# Patient Record
Sex: Male | Born: 1955
Health system: Southern US, Community
[De-identification: ages and names within clinical notes are randomized; demographics above are authoritative.]

## PROBLEM LIST (undated history)

## (undated) DIAGNOSIS — N289 Disorder of kidney and ureter, unspecified: Secondary | ICD-10-CM

## (undated) DIAGNOSIS — J449 Chronic obstructive pulmonary disease, unspecified: Secondary | ICD-10-CM

## (undated) DIAGNOSIS — I4892 Unspecified atrial flutter: Secondary | ICD-10-CM

## (undated) DIAGNOSIS — I5030 Unspecified diastolic (congestive) heart failure: Secondary | ICD-10-CM

## (undated) DIAGNOSIS — I10 Essential (primary) hypertension: Secondary | ICD-10-CM

## (undated) DIAGNOSIS — C189 Malignant neoplasm of colon, unspecified: Secondary | ICD-10-CM

## (undated) DIAGNOSIS — R079 Chest pain, unspecified: Secondary | ICD-10-CM

## (undated) DIAGNOSIS — I77810 Thoracic aortic ectasia: Secondary | ICD-10-CM

## (undated) DIAGNOSIS — I509 Heart failure, unspecified: Secondary | ICD-10-CM

## (undated) HISTORY — PX: KIDNEY STONE SURGERY: SHX686

## (undated) HISTORY — PX: PARTIAL COLECTOMY: SHX5273

## (undated) HISTORY — PX: OTHER SURGICAL HISTORY: SHX169

## (undated) HISTORY — DX: Malignant neoplasm of colon, unspecified: C18.9

## (undated) HISTORY — PX: LITHOTRIPSY: SUR834

---

## 2001-05-31 ENCOUNTER — Observation Stay (HOSPITAL_COMMUNITY): Admission: AD | Admit: 2001-05-31 | Discharge: 2001-06-01 | Payer: Self-pay | Admitting: Family Medicine

## 2001-05-31 ENCOUNTER — Ambulatory Visit (HOSPITAL_COMMUNITY): Admission: RE | Admit: 2001-05-31 | Discharge: 2001-05-31 | Payer: Self-pay | Admitting: Family Medicine

## 2001-05-31 ENCOUNTER — Encounter: Payer: Self-pay | Admitting: Family Medicine

## 2007-10-08 ENCOUNTER — Ambulatory Visit (HOSPITAL_COMMUNITY): Admission: RE | Admit: 2007-10-08 | Discharge: 2007-10-08 | Payer: Self-pay | Admitting: Family Medicine

## 2008-07-15 ENCOUNTER — Ambulatory Visit (HOSPITAL_COMMUNITY): Admission: RE | Admit: 2008-07-15 | Discharge: 2008-07-15 | Payer: Self-pay | Admitting: Family Medicine

## 2008-10-02 ENCOUNTER — Emergency Department (HOSPITAL_COMMUNITY): Admission: EM | Admit: 2008-10-02 | Discharge: 2008-10-02 | Payer: Self-pay | Admitting: Emergency Medicine

## 2008-10-06 ENCOUNTER — Ambulatory Visit (HOSPITAL_COMMUNITY): Admission: RE | Admit: 2008-10-06 | Discharge: 2008-10-06 | Payer: Self-pay | Admitting: Urology

## 2008-10-22 ENCOUNTER — Ambulatory Visit (HOSPITAL_COMMUNITY): Admission: RE | Admit: 2008-10-22 | Discharge: 2008-10-22 | Payer: Self-pay | Admitting: Urology

## 2008-10-29 ENCOUNTER — Ambulatory Visit (HOSPITAL_COMMUNITY): Admission: RE | Admit: 2008-10-29 | Discharge: 2008-10-29 | Payer: Self-pay | Admitting: Urology

## 2008-12-09 ENCOUNTER — Ambulatory Visit (HOSPITAL_COMMUNITY): Admission: RE | Admit: 2008-12-09 | Discharge: 2008-12-09 | Payer: Self-pay | Admitting: Urology

## 2008-12-30 ENCOUNTER — Ambulatory Visit (HOSPITAL_COMMUNITY): Admission: RE | Admit: 2008-12-30 | Discharge: 2008-12-30 | Payer: Self-pay | Admitting: Urology

## 2009-01-02 ENCOUNTER — Inpatient Hospital Stay (HOSPITAL_COMMUNITY): Admission: RE | Admit: 2009-01-02 | Discharge: 2009-01-05 | Payer: Self-pay | Admitting: Urology

## 2009-09-14 ENCOUNTER — Ambulatory Visit (HOSPITAL_COMMUNITY): Admission: RE | Admit: 2009-09-14 | Discharge: 2009-09-15 | Payer: Self-pay | Admitting: Urology

## 2010-02-04 ENCOUNTER — Ambulatory Visit (HOSPITAL_COMMUNITY): Admission: RE | Admit: 2010-02-04 | Discharge: 2010-02-04 | Payer: Self-pay | Admitting: Urology

## 2010-03-03 ENCOUNTER — Ambulatory Visit (HOSPITAL_COMMUNITY): Admission: RE | Admit: 2010-03-03 | Discharge: 2010-03-03 | Payer: Self-pay | Admitting: Urology

## 2010-03-08 ENCOUNTER — Ambulatory Visit (HOSPITAL_COMMUNITY): Admission: RE | Admit: 2010-03-08 | Discharge: 2010-03-08 | Payer: Self-pay | Admitting: Urology

## 2010-03-15 ENCOUNTER — Ambulatory Visit (HOSPITAL_COMMUNITY): Admission: RE | Admit: 2010-03-15 | Discharge: 2010-03-16 | Payer: Self-pay | Admitting: Urology

## 2010-08-04 LAB — URINE CULTURE
Colony Count: NO GROWTH
Colony Count: NO GROWTH
Culture  Setup Time: 201110171709
Culture  Setup Time: 201110171710
Culture: NO GROWTH
Culture: NO GROWTH
Special Requests: NEGATIVE

## 2010-08-05 LAB — SURGICAL PCR SCREEN
MRSA, PCR: NEGATIVE
MRSA, PCR: NEGATIVE
Staphylococcus aureus: NEGATIVE
Staphylococcus aureus: NEGATIVE

## 2010-08-05 LAB — CBC
HCT: 49.3 % (ref 39.0–52.0)
Hemoglobin: 17.2 g/dL — ABNORMAL HIGH (ref 13.0–17.0)
Hemoglobin: 16.6 g/dL (ref 13.0–17.0)
MCH: 34 pg (ref 26.0–34.0)
MCH: 34.1 pg — ABNORMAL HIGH (ref 26.0–34.0)
MCHC: 34.8 g/dL (ref 30.0–36.0)
MCV: 97.6 fL (ref 78.0–100.0)
Platelets: 161 10*3/uL (ref 150–400)
Platelets: 191 10*3/uL (ref 150–400)
RBC: 5.05 MIL/uL (ref 4.22–5.81)
RBC: 4.87 MIL/uL (ref 4.22–5.81)
RDW: 13.7 % (ref 11.5–15.5)
WBC: 7.9 10*3/uL (ref 4.0–10.5)
WBC: 8 10*3/uL (ref 4.0–10.5)

## 2010-08-05 LAB — BASIC METABOLIC PANEL
BUN: 14 mg/dL (ref 6–23)
CO2: 28 mEq/L (ref 19–32)
Calcium: 9.5 mg/dL (ref 8.4–10.5)
Calcium: 9.5 mg/dL (ref 8.4–10.5)
Chloride: 103 mEq/L (ref 96–112)
Creatinine, Ser: 0.98 mg/dL (ref 0.4–1.5)
GFR calc Af Amer: >60 mL/min (ref >60)
GFR calc Af Amer: >60 mL/min (ref >60)
GFR calc non Af Amer: >60 mL/min (ref >60)
GFR calc non Af Amer: >60 mL/min (ref >60)
Glucose, Bld: 122 mg/dL — ABNORMAL HIGH (ref 70–99)
Potassium: 4.5 mEq/L (ref 3.5–5.1)
Potassium: 4.6 mEq/L (ref 3.5–5.1)
Sodium: 139 mEq/L (ref 135–145)
Sodium: 141 mEq/L (ref 135–145)

## 2010-08-10 LAB — BASIC METABOLIC PANEL
BUN: 15 mg/dL (ref 6–23)
CO2: 24 mEq/L (ref 19–32)
Calcium: 9.1 mg/dL (ref 8.4–10.5)
Chloride: 105 mEq/L (ref 96–112)
Creatinine, Ser: 0.97 mg/dL (ref 0.4–1.5)
GFR calc Af Amer: >60 mL/min (ref >60)
GFR calc non Af Amer: >60 mL/min (ref >60)
Glucose, Bld: 115 mg/dL — ABNORMAL HIGH (ref 70–99)
Potassium: 3.9 mEq/L (ref 3.5–5.1)
Sodium: 137 mEq/L (ref 135–145)

## 2010-08-10 LAB — CBC
HCT: 46 % (ref 39.0–52.0)
Hemoglobin: 15.7 g/dL (ref 13.0–17.0)
MCHC: 34.2 g/dL (ref 30.0–36.0)
MCV: 99.4 fL (ref 78.0–100.0)
Platelets: 175 10*3/uL (ref 150–400)
RBC: 4.63 MIL/uL (ref 4.22–5.81)
RDW: 13.9 % (ref 11.5–15.5)
WBC: 7.5 10*3/uL (ref 4.0–10.5)

## 2010-08-28 LAB — BASIC METABOLIC PANEL
BUN: 12 mg/dL (ref 6–23)
BUN: 13 mg/dL (ref 6–23)
CO2: 24 mEq/L (ref 19–32)
Chloride: 104 mEq/L (ref 96–112)
Creatinine, Ser: 0.95 mg/dL (ref 0.4–1.5)
GFR calc Af Amer: >60 mL/min (ref >60)
GFR calc non Af Amer: >60 mL/min (ref >60)
GFR calc non Af Amer: >60 mL/min (ref >60)
Glucose, Bld: 103 mg/dL — ABNORMAL HIGH (ref 70–99)
Glucose, Bld: 121 mg/dL — ABNORMAL HIGH (ref 70–99)
Potassium: 3.4 mEq/L — ABNORMAL LOW (ref 3.5–5.1)
Potassium: 3.8 mEq/L (ref 3.5–5.1)
Sodium: 135 mEq/L (ref 135–145)
Sodium: 136 mEq/L (ref 135–145)

## 2010-08-28 LAB — DIFFERENTIAL
Basophils Absolute: 0 10*3/uL (ref 0.0–0.1)
Eosinophils Absolute: 0.1 10*3/uL (ref 0.0–0.7)
Lymphocytes Relative: 15 % (ref 12–46)
Lymphs Abs: 2 10*3/uL (ref 0.7–4.0)
Neutrophils Relative %: 75 % (ref 43–77)

## 2010-08-28 LAB — CBC
MCV: 101.8 fL — ABNORMAL HIGH (ref 78.0–100.0)
Platelets: 132 10*3/uL — ABNORMAL LOW (ref 150–400)
RDW: 13.2 % (ref 11.5–15.5)
WBC: 13.3 10*3/uL — ABNORMAL HIGH (ref 4.0–10.5)

## 2010-08-31 LAB — CBC
Platelets: 182 10*3/uL (ref 150–400)
RBC: 4.49 MIL/uL (ref 4.22–5.81)
WBC: 13.4 10*3/uL — ABNORMAL HIGH (ref 4.0–10.5)

## 2010-08-31 LAB — URINALYSIS, ROUTINE W REFLEX MICROSCOPIC
Glucose, UA: NEGATIVE mg/dL
Nitrite: NEGATIVE
Protein, ur: NEGATIVE mg/dL

## 2010-08-31 LAB — BASIC METABOLIC PANEL
BUN: 17 mg/dL (ref 6–23)
Calcium: 9.3 mg/dL (ref 8.4–10.5)
Chloride: 102 mEq/L (ref 96–112)
Creatinine, Ser: 1.28 mg/dL (ref 0.4–1.5)
GFR calc Af Amer: >60 mL/min (ref >60)
GFR calc non Af Amer: 59 mL/min — ABNORMAL LOW (ref >60)

## 2010-08-31 LAB — DIFFERENTIAL
Lymphocytes Relative: 12 % (ref 12–46)
Lymphs Abs: 1.7 10*3/uL (ref 0.7–4.0)
Monocytes Relative: 9 % (ref 3–12)
Neutro Abs: 10.4 10*3/uL — ABNORMAL HIGH (ref 1.7–7.7)
Neutrophils Relative %: 78 % — ABNORMAL HIGH (ref 43–77)

## 2010-10-05 NOTE — Op Note (Signed)
NAME:  KENTLEY, BLYDEN            ACCOUNT NO.:  [id number]   MEDICAL RECORD NO.:  [id number]          PATIENT TYPE:  AMB   LOCATION:  DAY                           FACILITY:  [facility]   PHYSICIAN:  Ky Barban, M.D.DATE OF BIRTH:  1956/01/08   DATE OF PROCEDURE:  DATE OF DISCHARGE:                               OPERATIVE REPORT   PREOPERATIVE DIAGNOSIS:  Right ureteral calculus.   POSTOPERATIVE DIAGNOSIS:  Right ureteral calculus.   PROCEDURES:  Cystoscopy, right retrograde pyelogram.  Ureteroscopic  stone basket attempt, to do the stone basket failed.   ANESTHESIA:  General.   PROCEDURE IN DETAILS:  The patient under general anesthesia in lithotomy  position as usual prep and drape, a #25 cystoscope introduced into the  bladder, right ureteral orifice catheterized with a wedge catheter,  Hypaque was injected.  The dye goes up into the upper ureter, even below  the stone was markedly dilated and above the stone and I can see the  filling defect at L3 level.  The dye does go up into the renal pelvis,  but the upper ureter was never visualized.  Anyway, a guidewire attempt  was tried to pass up into the renal pelvis.  I tried different  guidewires, it just does not go beyond the stone, so I simply dilated  the intramural ureter with a #15 balloon, then introduced short-rigid  ureteroscope over the guidewire, went to the level of the stone, that  area was completely blocked.  I could never see the stone.  Under direct  vision, I tried to pass a guidewire and nothing goes beyond the stone  under fluoroscopic control.  I again tried different baskets and scopes.  There was a lot of granulation tissue and there was a kink also.  The  patient was breathing hard and this area moves up and down, I just  cannot see the stone.  If I could see the stone, I could use the laser,  but I cannot even see the stone, I see lot of granulation tissue and no  guidewire goes up into the renal  pelvis above the stone, so I decided to  quit the procedure, did not have any complications.  All the instruments  and guidewires were removed.  The patient left the operating room in  satisfactory condition.  I think I will have to do open  ureterolithotomy.      Ky Barban, M.D.  Electronically Signed     MIJ/MEDQ  D:  12/30/2008  T:  12/30/2008  Job:  [id number]

## 2010-10-05 NOTE — H&P (Signed)
NAME:  [name], [name]            ACCOUNT NO.:  [id number]   MEDICAL RECORD NO.:  [id number]         PATIENT TYPE:  PAMB   LOCATION:  DAY                           FACILITY:  [facility]   PHYSICIAN:  Ky Barban, M.D.DATE OF BIRTH:  05/22/1956   DATE OF ADMISSION:  12/30/2008  DATE OF DISCHARGE:  LH                              HISTORY & PHYSICAL   CHIEF COMPLAINT:  Right ureteral calculus.   HISTORY:  A 55 year old gentleman was first seen by me in May of this  year with right renal colic and CT scan showed that he has a large  calculus almost 1 cm in the right ureter causing minimum obstruction, so  I advised him to undergo ESL which was done around that time.  He has  passed lot of stone, but when I did a KUB he continued to have large  stone.  He still is symptomatic and I gave him the choice that we need  to go ahead and treat him again with ESL or I can do a stone basket.  After long discussions, the patient decided that he wanted to go ahead  and have a stone basket done.  I told him the procedure's limitations,  complications especially ureteral perforation leading to open surgery.  No guarantees about the results and use of double-J stent.  He  understands, wanted to go ahead and proceed.   PAST MEDICAL HISTORY:  Otherwise negative.   FAMILY HISTORY:  Negative.  No history of prostate cancer.   PERSONAL HISTORY:  Does not smoke or drink.   REVIEW OF SYSTEMS:  Unremarkable.   EXAMINATION:  Blood pressure 130/90, temperature is normal.  CENTRAL NERVOUS SYSTEM:  Negative.  HEAD/NECK/EYE/ENT:  Negative.  CHEST:  Symmetrical.  HEART:  Regular sinus rhythm, no murmur.  ABDOMEN:  Soft, flat.  Liver, spleen, kidneys are not palpable.  No CVA  tenderness.  EXTERNAL GENITALIA:  Circumcised, meatus adequate.  Testicles are  normal.  RECTAL:  Exam deferred.  EXTREMITIES:  Are normal.   IMPRESSION:  1. Right ureteral calculus.  2. Hypertension.   NOTE:  HE IS ALLERGIC  TO PENICILLIN.  He is taking Flomax for BPH  symptoms and also medicines for his blood pressure.   PLAN:  Cystoscopy, right retrograde pyelogram, ureteroscopic stone  basket, holmium laser lithotripsy under anesthesia as outpatient.  I may  have to insert a double-J stent.      Ky Barban, M.D.  Electronically Signed     MIJ/MEDQ  D:  12/29/2008  T:  12/29/2008  Job:  [id number]

## 2010-10-05 NOTE — Op Note (Signed)
NAME:  [name], [name]            ACCOUNT NO.:  [id number]   MEDICAL RECORD NO.:  [id number]          PATIENT TYPE:  INP   LOCATION:  A304                          FACILITY:  [facility]   PHYSICIAN:  Ky Barban, M.D.DATE OF BIRTH:  1956-01-31   DATE OF PROCEDURE:  DATE OF DISCHARGE:                               OPERATIVE REPORT   PREOPERATIVE DIAGNOSIS:  Right ureteral calculus.   POSTOPERATIVE DIAGNOSIS:  Right ureteral calculus.   PROCEDURE:  Right ureteral lithotomy and operative ureteroscopy.   ANESTHESIA:  General endotracheal.   ESTIMATED BLOOD LOSS:  50 mL.   COMPLICATIONS:  None.   INDICATIONS:  This patient has a large 1.5-cm stone.  He had one ESL  done, part of the stone came out, the other part I went with  ureteroscopy, but I was unable to get the stone arch, so I decided to  open ureteral lithotomy.  The patient was placed in right-side-up  position, over the kidney rest, the kidney rest was raised position,  patient was secured to the table.  After usual prep and drape, the  twelfth rib was already marked, incision was made below the twelfth rib  ending about couple of inches medial to it and going backward to the  angle of the rib.  The subcutaneous tissue was divided.  Muscles were  divided in the line of the incision.  The transversalis fascia was  exposed.  It was opened up at the site of the twelfth rib and the  retroperitoneum was entered.  The peritoneum was pushed anteriorly and  working on the retroperitoneum, psoas muscle was exposed by pushing the  Gerota fascia off it and with blunt and sharp dissection, I was able to  identify the ureter.  The stone was palpable.  A vessel loop was passed  below the stone and also above the stone.  It was held in place and  there was a lot of fat struck to this area of the ureter which was quite  dilated.  With palpation of the stone, an incision in the ureter was  made straight about an inch long and the stone  was impacted in the  ureter.  There was lot of tissue growing in the stone and was broken  already with several pieces because of previous lithotripsy.  Gently,  these pieces were removed one by one and after making sure all of the  pieces were out, the #8 feeding tube was passed towards the bladder  where he already had a Foley catheter clamped and after making sure the  feeding tube was in the bladder, then the same feeding tube was passed  up into the renal pelvis.  It was irrigated and with irrigation, the  tube came out and no more stone came out.  Because of multiple pieces of  stone, I decided to look into the ureter to make sure there was no  remaining piece and operative ureteroscopy was done with flexible and  also rigid scope, especially in the upper part and I do not see any  pathology in the upper ureter.  There was a  lot of inflammatory tissue  at the site where the stone was, but there was no residual stone or any  other piece.  So once the ureteroscopy was done, I decided to put #6  double-J stent lower and was passed over the guidewire, then upper end  was also positioned with the help of the guidewire.  The stent seemed to  be in proper place and urethrotomy site was closed with 3 stitches of 4-  0 Vicryl just approximating the seromuscular layer gently.  The  operative site was thoroughly irrigated with saline and there was no  bleeding.  Operative site was then drained with a Shiley sump which came  out through a separate stab wound below the incision and it was  stabilized to the skin with a silk stitch.  I was ready to close the  incision, the table was made flat, the kidney rest was dropped and the  wound was closed in 2 layers using zero Vicryl interrupted sutures.  The  subcutaneous tissue was approximated with 3-0 plain catgut.  I placed 2  catheters for irrigation with local anesthetic above and below the  incision.  The skin was closed with staples.  Sterile  gauze dressing  applied.  The patient left the operating room in satisfactory condition.  He lost about 50 mL of blood, remained stable.  No complications.      Ky Barban, M.D.  Electronically Signed     MIJ/MEDQ  D:  01/02/2009  T:  01/02/2009  Job:  [id number]

## 2010-10-05 NOTE — H&P (Signed)
NAME:  [name], [name]            ACCOUNT NO.:  [id number]   MEDICAL RECORD NO.:  [id number]         PATIENT TYPE:  PAMB   LOCATION:  DAY                           FACILITY:  [facility]   PHYSICIAN:  Ky Barban, M.D.DATE OF BIRTH:  1956/05/19   DATE OF ADMISSION:  DATE OF DISCHARGE:  LH                              HISTORY & PHYSICAL   CHIEF COMPLAINT:  Right ureteral calculus.   A 55 year old gentleman.  First I did ESL of right ureteral calculus  which was done in June of this year.  He passed few pieces of stone, but  there is still large, almost 1.5 cm stone located in the right mid  ureter causing obstruction.  Interestingly, he is very asymptomatic, so  I decided to go ahead and do a stone basket and it was unsuccessful  which I tried couple of days ago.  The only other option left was to do  open ureterolithotomy which could be done laparoscopically or regular  open procedure or use given the option of doing percutaneous approach to  get to this ureteral calculus.  I told him he may end up having this  procedure at the end, so he decided to go ahead and have ureteral  lithotomy procedure, limitation, complication, and longer recovery.  It  was discussed in detail.  He understands and wanted me to go out and  proceed.  He is coming as outpatient.  We will undergo right ureteral  lithotomy as outpatient.   PAST MEDICAL HISTORY:  Negative.   PERSONAL HISTORY:  He does not smoke and drinks occasionally socially.   ALLERGIES:  He is allergic to PENICILLIN.   PHYSICAL EXAMINATION:  VITAL SIGNS:  He does have a high blood pressure  for which he takes medicines.  On examination, blood pressure 133/96,  temperature 98.1.  CENTRAL NERVOUS SYSTEM:  Negative.  HEAD, NECK, EYE, AND ENT:  Negative.  CHEST:  Symmetrical.  HEART:  Regular sinus rhythm.  No murmur.  ABDOMEN:  Soft, flat.  Liver, spleen, and kidneys are not palpable.  No  CVA tenderness.  EXTERNAL GENITALIA:   Circumcised, meatus adequate.  Testicles are  normal.  RECTAL:  Deferred.  EXTREMITIES:  Normal.   IMPRESSION:  Right ureteral calculus.   PLAN:  Right ureteral lithotomy and use of double-J stent, then admitted  in the hospital.      Ky Barban, M.D.  Electronically Signed     MIJ/MEDQ  D:  01/01/2009  T:  01/02/2009  Job:  [id number]

## 2010-10-05 NOTE — Consult Note (Signed)
NAME:  LAYNE, DILAURO            ACCOUNT NO.:  [id number]   MEDICAL RECORD NO.:  [id number]          PATIENT TYPE:  AMB   LOCATION:  DAY                           FACILITY:  [facility]   PHYSICIAN:  Ky Barban, M.D.DATE OF BIRTH:  16-Sep-1955   DATE OF CONSULTATION:  DATE OF DISCHARGE:                                 CONSULTATION   CHIEF COMPLAINT:  Right renal colic.   HISTORY OF PRESENT ILLNESS:  A 55 year old gentleman was seen by me on  May 17 because he went to the emergency room.  CT scan showed there is a  30-mm stone in the right mid ureter.  He was vomiting, but the day I  seen him, he was completely asymptomatic, so I told him we need go ahead  and try do ESL.  Procedure, limitation, complications discussed, need  for additional procedure discussed.  He understands, want me to go ahead  and schedule.   PAST MEDICAL HISTORY:  Negative.   PERSONAL HISTORY:  Does not smoke or drink.  He drinks only socially.   ALLERGIES:  PENICILLIN.   He does have high blood pressure, takes medicines.   PHYSICAL EXAMINATION:  VITAL SIGNS:  Blood pressure 133/96, temperature  98.1.  CENTRAL NERVOUS SYSTEM:  Negative.  HEAD, NECK, ENT:  Negative.  CHEST:  Symmetrical.  HEART:  Regular S1 and S2 rhythm.  No murmur.  ABDOMEN:  Soft, flat.  Liver, spleen, kidneys not palpable.  No CVA  tenderness.  EXTERNAL GENITALIA:  Circumcised, meatus adequate.  Testicles are  normal.  RECTAL:  Deferred.  EXTREMITIES:  Normal.   IMPRESSION:  Right ureteral calculus.   PLAN:  ESL as outpatient.     Ky Barban, M.D.  Electronically Signed    MIJ/MEDQ  D:  10/21/2008  T:  10/22/2008  Job:  [id number]

## 2010-10-08 NOTE — H&P (Signed)
[facility]  Patient:    [name], [name] Visit Number: [id number] MRN: [id number]          Service Type: MED Location: 2A A222 01 Attending Physician:  Kirk Ruths Dictated by:   Karleen Hampshire, M.D. Admit Date:  05/31/2001                           History and Physical  CHIEF COMPLAINT:  Chest pain.  HISTORY OF PRESENT ILLNESS:  This is a 55 year old white male who has been having some short-lived left chest pains intermittently over the last 24 hours.  The patient states sometimes the pains are sharp, sometimes they feel like a tightness.  Denies nausea, vomiting, diaphoresis.  Denies association with movement, although the pains often happen after he has eaten.  In the office the patient had an EKG which showed a regular sinus rhythm with no ST but poor R-wave progression from V2 to V3.  The patient is a smoker.  He has a history of some elevated lipids in the past, and his blood pressure in the office today was 140/100.  The patient also underwent a chest x-ray at the office which was normal by my reading.  ALLERGIES:  PENICILLIN.  PAST MEDICAL HISTORY:  Elbow surgery in the past.  MEDICATIONS:  He is on no medications regularly.  FAMILY HISTORY:  Negative for coronary artery disease.  SOCIAL HISTORY:  He smokes.  Drinks alcohol.  Denies substance abuse.  PHYSICAL EXAMINATION:  GENERAL:  Middle-aged white male in no distress.  VITAL SIGNS:  Afebrile, respirations 20, pulse 95 and regular, blood pressure 140/100.  HEENT:  TMs normal.  Pupils are equal, round, and reactive to light and accommodation.  Oropharynx benign.  NECK:  Supple.  Without JVD, bruit, or thyromegaly.  LUNGS:  Clear in all areas.  HEART:  Regular sinus rhythm.  Without murmur, gallop, or rub.  ABDOMEN:  Soft and nontender.  EXTREMITIES:  Without clubbing, cyanosis, or edema.  NEUROLOGIC:  Grossly intact.  ASSESSMENT:  Chest pain, unknown  etiology.  Rule out myocardial infarction. Will do cardiac enzymes, serial EKGs, as well as stress.  Cardiology consult. Dictated by:   Karleen Hampshire, M.D. Attending Physician:  Kirk Ruths DD:  05/31/01 TD:  05/31/01 Job: [id number]

## 2011-01-13 ENCOUNTER — Emergency Department (HOSPITAL_COMMUNITY): Payer: BC Managed Care – PPO

## 2011-01-13 ENCOUNTER — Encounter: Payer: Self-pay | Admitting: *Deleted

## 2011-01-13 ENCOUNTER — Other Ambulatory Visit: Payer: Self-pay

## 2011-01-13 ENCOUNTER — Emergency Department (HOSPITAL_COMMUNITY)
Admission: EM | Admit: 2011-01-13 | Discharge: 2011-01-13 | Disposition: A | Discharge status: Home / Self Care | Payer: BC Managed Care – PPO | Attending: Emergency Medicine | Admitting: Emergency Medicine

## 2011-01-13 DIAGNOSIS — F17200 Nicotine dependence, unspecified, uncomplicated: Secondary | ICD-10-CM | POA: Insufficient documentation

## 2011-01-13 DIAGNOSIS — R209 Unspecified disturbances of skin sensation: Secondary | ICD-10-CM | POA: Insufficient documentation

## 2011-01-13 DIAGNOSIS — R079 Chest pain, unspecified: Secondary | ICD-10-CM | POA: Insufficient documentation

## 2011-01-13 DIAGNOSIS — I10 Essential (primary) hypertension: Secondary | ICD-10-CM | POA: Insufficient documentation

## 2011-01-13 DIAGNOSIS — I4510 Unspecified right bundle-branch block: Secondary | ICD-10-CM | POA: Insufficient documentation

## 2011-01-13 HISTORY — DX: Essential (primary) hypertension: I10

## 2011-01-13 HISTORY — DX: Disorder of kidney and ureter, unspecified: N28.9

## 2011-01-13 LAB — BASIC METABOLIC PANEL
CO2: 26 mEq/L (ref 19–32)
Calcium: 9.5 mg/dL (ref 8.4–10.5)
Chloride: 100 mEq/L (ref 96–112)
Creatinine, Ser: 0.9 mg/dL (ref 0.50–1.35)
Glucose, Bld: 134 mg/dL — ABNORMAL HIGH (ref 70–99)

## 2011-01-13 LAB — CBC
Hemoglobin: 16 g/dL (ref 13.0–17.0)
MCH: 34.3 pg — ABNORMAL HIGH (ref 26.0–34.0)
MCV: 98.3 fL (ref 78.0–100.0)
Platelets: 160 10*3/uL (ref 150–400)
RBC: 4.67 MIL/uL (ref 4.22–5.81)
WBC: 7.5 10*3/uL (ref 4.0–10.5)

## 2011-01-13 LAB — CARDIAC PANEL(CRET KIN+CKTOT+MB+TROPI)
CK, MB: 9.1 ng/mL (ref 0.3–4.0)
Relative Index: 4.8 — ABNORMAL HIGH (ref 0.0–2.5)
Total CK: 190 U/L (ref 7–232)

## 2011-01-13 MED ORDER — ASPIRIN 81 MG PO CHEW
324.0000 mg | CHEWABLE_TABLET | Freq: Once | ORAL | Status: AC
  Administered 2011-01-13: 324 mg via ORAL

## 2011-01-13 MED ORDER — NITROGLYCERIN 0.4 MG SL SUBL
0.4000 mg | SUBLINGUAL_TABLET | Freq: Once | SUBLINGUAL | Status: AC
  Administered 2011-01-13: 0.4 mg via SUBLINGUAL

## 2011-01-13 NOTE — ED Notes (Signed)
Patient with no complaints at this time. Respirations even and unlabored. Skin warm/dry. Discharge instructions reviewed with patient at this time. Patient given opportunity to voice concerns/ask questions. IV removed per policy and band-aid applied to site. Patient discharged at this time and left Emergency Department with steady gait.  

## 2011-01-13 NOTE — ED Provider Notes (Signed)
History     CSN: [id number] Arrival date & time: 01/13/2011  4:14 AM  Chief Complaint  Patient presents with  . Chest Pain   HPI Comments: Seen 0416.   Patient is a 55 y.o. male presenting with chest pain. The history is provided by the patient.  Chest Pain The chest pain began 1 - 2 hours ago (Patient woke up to go to the bathroom. His left arm and hand were numb and he had discomfort in his shoulder and his left chest. Once he got up and went to the bathoom the numbness and aching in his arm resolved. He continued to have aching to shoulder .). The chest pain is unchanged (Discomfort to shoulder and left chest continue.). Associated with: nothing. At its most intense, the pain is at 5/10. The pain is currently at 4/10. The quality of the pain is described as aching and pressure-like. Radiates to: noticed first in his arm and hand, then shoulder and chest. Exacerbated by: nothing. Pertinent negatives for primary symptoms include no fever, no fatigue, no syncope, no shortness of breath, no cough, no wheezing, no palpitations, no abdominal pain, no nausea, no vomiting and no altered mental status. Primary symptoms comment: numbness and tingling in L arm, shoulder pain, chest pain  Associated symptoms include numbness.  Pertinent negatives for associated symptoms include no claudication, no diaphoresis, no lower extremity edema, no near-syncope, no orthopnea, no paroxysmal nocturnal dyspnea and no weakness. He tried nothing for the symptoms. Risk factors include lack of exercise, male gender and smoking/tobacco exposure. Past medical history comments: hypertension, kidney stones  Procedure history is negative for cardiac catheterization, echocardiogram and exercise treadmill test.     Past Medical History  Diagnosis Date  . Hypertension   . Renal disorder     Past Surgical History  Procedure Date  . Lithotripsy   . Kidney stone surgery     History reviewed. No pertinent family  history.  History  Substance Use Topics  . Smoking status: Current Everyday Smoker  . Smokeless tobacco: Not on file  . Alcohol Use: Yes     occasionally      Review of Systems  Constitutional: Negative for fever, diaphoresis and fatigue.  Respiratory: Negative for cough, shortness of breath and wheezing.   Cardiovascular: Positive for chest pain. Negative for palpitations, orthopnea, claudication, leg swelling, syncope and near-syncope.  Gastrointestinal: Negative for nausea, vomiting and abdominal pain.  Neurological: Positive for numbness. Negative for weakness.  Psychiatric/Behavioral: Negative for altered mental status.  All other systems reviewed and are negative.    Physical Exam  BP 131/84  Pulse 90  Temp(Src) 97.9 F (36.6 C) (Oral)  Resp 18  Ht 6' (1.829 m)  Wt 250 lb (113.399 kg)  BMI 33.91 kg/m2  SpO2 96%  Physical Exam  Nursing note and vitals reviewed. Constitutional: He is oriented to person, place, and time. He appears well-developed and well-nourished.  HENT:  Head: Normocephalic and atraumatic.  Eyes: EOM are normal.  Neck: Normal range of motion. Neck supple.  Cardiovascular: Normal rate, normal heart sounds and intact distal pulses.   Pulmonary/Chest: Effort normal and breath sounds normal.  Abdominal: Soft.  Musculoskeletal: Normal range of motion.  Neurological: He is alert and oriented to person, place, and time. He has normal reflexes.  Skin: Skin is warm and dry.    ED Course  Procedures  Results for orders placed during the hospital encounter of 01/13/11  CBC      Component Value  Range   WBC 7.5  4.0 - 10.5 (K/uL)   RBC 4.67  4.22 - 5.81 (MIL/uL)   Hemoglobin 16.0  13.0 - 17.0 (g/dL)   HCT 40.9  81.1 - 91.4 (%)   MCV 98.3  78.0 - 100.0 (fL)   MCH 34.3 (*) 26.0 - 34.0 (pg)   MCHC 34.9  30.0 - 36.0 (g/dL)   RDW 78.2  95.6 - 21.3 (%)   Platelets 160  150 - 400 (K/uL)  BASIC METABOLIC PANEL      Component Value Range   Sodium 135   135 - 145 (mEq/L)   Potassium 3.7  3.5 - 5.1 (mEq/L)   Chloride 100  96 - 112 (mEq/L)   CO2 26  19 - 32 (mEq/L)   Glucose, Bld 134 (*) 70 - 99 (mg/dL)   BUN 17  6 - 23 (mg/dL)   Creatinine, Ser 0.86  0.50 - 1.35 (mg/dL)   Calcium 9.5  8.4 - 57.8 (mg/dL)   GFR calc non Af Amer >60  >60 (mL/min)   GFR calc Af Amer >60  >60 (mL/min)  CARDIAC PANEL(CRET KIN+CKTOT+MB+TROPI)      Component Value Range   Total CK 190  7 - 232 (U/L)   CK, MB 9.1 (*) 0.3 - 4.0 (ng/mL)   Troponin I <0.30  <0.30 (ng/mL)   Relative Index 4.8 (*) 0.0 - 2.5    MDM  Date: 01/13/2011  0409  Rate: 94  Rhythm: normal sinus rhythm and premature ventricular contractions (PVC)  QRS Axis: right  Intervals: normal  ST/T Wave abnormalities: normal  Conduction Disutrbances:incomplete RBBB  Narrative Interpretation:   Old EKG Reviewed: none available Patient with onset of left arm numbness with pain to the shoulder and left chest upon waking. Partially resolved when he got up. Given aspirin and ntg SL. Pain relieved. Ekg with RBBB and no previous to compare with. Chest xray negative. Labs unremarkable except for an elevated MB. Troponin normal and myoglobin normal. Patient given IVF, took PO fluids. Ambulated in the department without pain or shortness of breath. Patient will follow up with [facility].Pt feels improved after observation and/or treatment in ED.Reviewed all results. Stressed importance of follow up. He will return to the ER if he has any further chest pain. MDM Reviewed: nursing note and vitals Interpretation: labs, ECG and x-ray         Nicoletta Dress. Colon Branch, MD 01/13/11 [phone]

## 2011-01-13 NOTE — ED Notes (Signed)
Pt c/o cp to left side of chest that radiates down left arm; pt describes pain as a pressure and states the pain woke him up from sleep

## 2011-01-13 NOTE — ED Notes (Signed)
CRITICAL VALUE ALERT  Critical value received:  CKMB  Date of notification: 01/13/11  Time of notification:  0502  Critical value read back:  Yes  Nurse who received alert:  Neldon Mc RN  MD notified (1st page):  Greta Doom MD  Time of first page:  0503  MD notified (2nd page):  Time of second page:  Responding MD:  Greta Doom MD  Time MD responded:  [phone]

## 2011-01-13 NOTE — ED Notes (Signed)
Pt given asa and 1 sl ntg, rated pain 1 before meds, now rates 0, feeling is better.  md notified

## 2011-01-13 NOTE — ED Notes (Signed)
Pt stated that he woke from sleep tonight having left side cp, pain radiates sown left, +sob at times, denies any n/v, no cold/cough. No fever. Felt well all day, denie sany cardiac history, but had stress test apporx 4 years ago, which he said was neg.  Drive self to er, +smoker.  Pt placed on all monitors, ekg obained andiv started and labs drawn.  Skin warm and dry at arrival.

## 2011-09-01 ENCOUNTER — Other Ambulatory Visit (HOSPITAL_COMMUNITY): Payer: Self-pay | Admitting: Family Medicine

## 2011-09-01 ENCOUNTER — Ambulatory Visit (HOSPITAL_COMMUNITY)
Admission: RE | Admit: 2011-09-01 | Discharge: 2011-09-01 | Disposition: A | Discharge status: Home / Self Care | Payer: BC Managed Care – PPO | Source: Ambulatory Visit | Attending: Family Medicine | Admitting: Family Medicine

## 2011-09-01 DIAGNOSIS — R079 Chest pain, unspecified: Secondary | ICD-10-CM | POA: Insufficient documentation

## 2011-09-01 DIAGNOSIS — W19XXXA Unspecified fall, initial encounter: Secondary | ICD-10-CM | POA: Insufficient documentation

## 2011-09-01 DIAGNOSIS — S2239XA Fracture of one rib, unspecified side, initial encounter for closed fracture: Secondary | ICD-10-CM | POA: Insufficient documentation

## 2012-05-25 ENCOUNTER — Telehealth: Payer: Self-pay

## 2012-05-25 NOTE — Telephone Encounter (Signed)
Ginger called and LMOM for a return call.  

## 2012-06-05 NOTE — Telephone Encounter (Signed)
LMOM to call.

## 2012-06-11 NOTE — Telephone Encounter (Signed)
Letter to PCP

## 2013-04-01 ENCOUNTER — Telehealth: Payer: Self-pay

## 2013-04-01 NOTE — Telephone Encounter (Signed)
Pt was referred by Dr. Regino Schultze for screening colonoscopy. He left VM he is ready to schedule. I returned his call and LMOM for a return call.

## 2013-04-02 ENCOUNTER — Other Ambulatory Visit: Payer: Self-pay

## 2013-04-02 DIAGNOSIS — Z1211 Encounter for screening for malignant neoplasm of colon: Secondary | ICD-10-CM

## 2013-04-02 NOTE — Telephone Encounter (Signed)
PREPOPIK-DRINK WATER TO KEEP URINE LIGHT YELLOW.  PT SHOULD DROP OFF RX 3 DAYS PRIOR TO PROCEDURE.  

## 2013-04-02 NOTE — Telephone Encounter (Signed)
Gastroenterology Pre-Procedure Review  Request Date: 04/02/2013 Requesting Physician: Dr. Regino Schultze  PATIENT REVIEW QUESTIONS: The patient responded to the following health history questions as indicated:    1. Diabetes Melitis: no 2. Joint replacements in the past 12 months: no 3. Major health problems in the past 3 months: no 4. Has an artificial valve or MVP: no 5. Has a defibrillator: no 6. Has been advised in past to take antibiotics in advance of a procedure like teeth cleaning: no    MEDICATIONS & ALLERGIES:    Patient reports the following regarding taking any blood thinners:   Plavix? no Aspirin? no Coumadin? no  Patient confirms/reports the following medications:  Current Outpatient Prescriptions  Medication Sig Dispense Refill  . LISINOPRIL PO Take 10 mg by mouth daily.       . Multiple Vitamin (MULTIVITAMIN) tablet Take 1 tablet by mouth daily.       No current facility-administered medications for this visit.    Patient confirms/reports the following allergies:  Allergies  Allergen Reactions  . Penicillins     No orders of the defined types were placed in this encounter.    AUTHORIZATION INFORMATION Primary Insurance:   ID #:   Group #:  Pre-Cert / Auth required:  Pre-Cert / Auth #:   Secondary Insurance:   ID #:  Group #:  Pre-Cert / Auth required:  Pre-Cert / Auth #:   SCHEDULE INFORMATION: Procedure has been scheduled as follows:  Date: 04/29/2013                 Time:  10:30 AM Location: [facility] Short Stay  This Gastroenterology Pre-Precedure Review Form is being routed to the following provider(s): Jonette Eva, MD

## 2013-04-03 MED ORDER — SOD PICOSULFATE-MAG OX-CIT ACD 10-3.5-12 MG-GM-GM PO PACK: 1.0000 | PACK | ORAL | Status: DC

## 2013-04-03 NOTE — Telephone Encounter (Signed)
Rx sent to the pharmacy and instructions mailed to pt.  

## 2013-04-09 ENCOUNTER — Ambulatory Visit (HOSPITAL_COMMUNITY)
Admission: RE | Admit: 2013-04-09 | Discharge: 2013-04-09 | Disposition: A | Discharge status: Home / Self Care | Payer: BC Managed Care – PPO | Source: Ambulatory Visit | Attending: Family Medicine | Admitting: Family Medicine

## 2013-04-09 ENCOUNTER — Other Ambulatory Visit (HOSPITAL_COMMUNITY): Payer: Self-pay | Admitting: Family Medicine

## 2013-04-09 DIAGNOSIS — R55 Syncope and collapse: Secondary | ICD-10-CM

## 2013-04-09 DIAGNOSIS — I10 Essential (primary) hypertension: Secondary | ICD-10-CM | POA: Insufficient documentation

## 2013-04-22 DIAGNOSIS — C189 Malignant neoplasm of colon, unspecified: Secondary | ICD-10-CM

## 2013-04-22 HISTORY — DX: Malignant neoplasm of colon, unspecified: C18.9

## 2013-04-24 ENCOUNTER — Encounter (HOSPITAL_COMMUNITY): Payer: Self-pay | Admitting: Pharmacy Technician

## 2013-04-29 ENCOUNTER — Ambulatory Visit (HOSPITAL_COMMUNITY)
Admission: RE | Admit: 2013-04-29 | Discharge: 2013-04-29 | Disposition: A | Discharge status: Home / Self Care | Payer: BC Managed Care – PPO | Source: Ambulatory Visit | Attending: Gastroenterology | Admitting: Gastroenterology

## 2013-04-29 ENCOUNTER — Encounter (HOSPITAL_COMMUNITY)
Admission: RE | Disposition: A | Discharge status: Home / Self Care | Payer: Self-pay | Source: Ambulatory Visit | Attending: Gastroenterology

## 2013-04-29 ENCOUNTER — Encounter (HOSPITAL_COMMUNITY): Payer: Self-pay | Admitting: *Deleted

## 2013-04-29 DIAGNOSIS — K639 Disease of intestine, unspecified: Secondary | ICD-10-CM

## 2013-04-29 DIAGNOSIS — K620 Anal polyp: Secondary | ICD-10-CM | POA: Insufficient documentation

## 2013-04-29 DIAGNOSIS — K648 Other hemorrhoids: Secondary | ICD-10-CM | POA: Insufficient documentation

## 2013-04-29 DIAGNOSIS — Z1211 Encounter for screening for malignant neoplasm of colon: Secondary | ICD-10-CM

## 2013-04-29 DIAGNOSIS — I10 Essential (primary) hypertension: Secondary | ICD-10-CM | POA: Insufficient documentation

## 2013-04-29 DIAGNOSIS — D126 Benign neoplasm of colon, unspecified: Secondary | ICD-10-CM

## 2013-04-29 DIAGNOSIS — D128 Benign neoplasm of rectum: Secondary | ICD-10-CM | POA: Insufficient documentation

## 2013-04-29 HISTORY — PX: COLONOSCOPY: SHX5424

## 2013-04-29 LAB — COMPREHENSIVE METABOLIC PANEL
Albumin: 3.9 g/dL (ref 3.5–5.2)
BUN: 12 mg/dL (ref 6–23)
Creatinine, Ser: 0.94 mg/dL (ref 0.50–1.35)
GFR calc Af Amer: >90 mL/min (ref >90)
Glucose, Bld: 120 mg/dL — ABNORMAL HIGH (ref 70–99)
Total Protein: 7.3 g/dL (ref 6.0–8.3)

## 2013-04-29 LAB — CBC WITH DIFFERENTIAL/PLATELET
Basophils Relative: 0 % (ref 0–1)
Eosinophils Absolute: 0.1 10*3/uL (ref 0.0–0.7)
Hemoglobin: 17.5 g/dL — ABNORMAL HIGH (ref 13.0–17.0)
MCHC: 35.1 g/dL (ref 30.0–36.0)
Monocytes Relative: 11 % (ref 3–12)
Neutro Abs: 4.9 10*3/uL (ref 1.7–7.7)
Neutrophils Relative %: 65 % (ref 43–77)
Platelets: 163 10*3/uL (ref 150–400)
RBC: 5.05 MIL/uL (ref 4.22–5.81)

## 2013-04-29 LAB — PROTIME-INR
INR: 1.03 (ref 0.00–1.49)
Prothrombin Time: 13.3 seconds (ref 11.6–15.2)

## 2013-04-29 SURGERY — COLONOSCOPY
Anesthesia: Moderate Sedation

## 2013-04-29 MED ORDER — SPOT INK MARKER SYRINGE KIT
PACK | SUBMUCOSAL | Status: DC | PRN
  Administered 2013-04-29: 6 mL via SUBMUCOSAL

## 2013-04-29 MED ORDER — MIDAZOLAM HCL 5 MG/5ML IJ SOLN
INTRAMUSCULAR | Status: DC | PRN
  Administered 2013-04-29 (×2): 2 mg via INTRAVENOUS

## 2013-04-29 MED ORDER — MIDAZOLAM HCL 5 MG/5ML IJ SOLN
INTRAMUSCULAR | Status: AC
  Filled 2013-04-29: qty 10

## 2013-04-29 MED ORDER — MEPERIDINE HCL 100 MG/ML IJ SOLN
INTRAMUSCULAR | Status: DC | PRN
  Administered 2013-04-29 (×2): 25 mg via INTRAVENOUS

## 2013-04-29 MED ORDER — SODIUM CHLORIDE 0.9 % IV SOLN
INTRAVENOUS | Status: DC
  Administered 2013-04-29: 10:00:00 via INTRAVENOUS

## 2013-04-29 MED ORDER — SODIUM CHLORIDE 0.9 % IJ SOLN
PREFILLED_SYRINGE | INTRAMUSCULAR | Status: DC | PRN
  Administered 2013-04-29 (×2)

## 2013-04-29 MED ORDER — MEPERIDINE HCL 100 MG/ML IJ SOLN
INTRAMUSCULAR | Status: AC
  Filled 2013-04-29: qty 2

## 2013-04-29 NOTE — Op Note (Signed)
[facility] [address] [place], [address]   COLONOSCOPY PROCEDURE REPORT  PATIENT: [name], [name]  MR#: [id number] BIRTHDATE: 06-13-1955 , 57  yrs. old GENDER: Male ENDOSCOPIST: Jonette Eva, MD REFERRED AO:[id number] Regino Schultze, M.D. PROCEDURE DATE:  04/29/2013 PROCEDURE:   Colonoscopy with snare polypectomy, Colonoscopy with biopsy, and Submucosal injection, any substance INDICATIONS:Average risk patient for colon cancer. MEDICATIONS: Demerol 50 mg IV and Versed 4 mg IV  DESCRIPTION OF PROCEDURE:    Physical exam was performed.  Informed consent was obtained from the patient after explaining the benefits, risks, and alternatives to procedure.  The patient was connected to monitor and placed in left lateral position. Continuous oxygen was provided by nasal cannula and IV medicine administered through an indwelling cannula.  After administration of sedation and rectal exam, the patients rectum was intubated and the EC-[id number] ([id number])  colonoscope was advanced under direct visualization to the ileum.  The scope was removed slowly by carefully examining the color, texture, anatomy, and integrity mucosa on the way out.  The patient was recovered in endoscopy and discharged home in satisfactory condition.    COLON FINDINGS: The mucosa appeared normal in the terminal ileum.  , Four sessile polyps measuring 10-20 mm in size were found in the transverse colon and descending colon. Polypectomy was performed using snare cautery. THE 1 CM DC POLYP SITE WAS TATTOOED WITH 1 CC SPOT. THE 2 CM DC POLYPECTOMY wound WAS TATTOOED WITH SPOT & closed by placing hemoclips.  One (1) placement was made.  A half circumferential ulcerated mass (27-35 CM FROM THE ANAL VERGE) was found in the sigmoid colon.  Multiple biopsies were performed using cold forceps. SPOT TATTO WAS PERFORMED 27 CM AND 35 CM FROM THE ANAL VERGE. FOUR POLYPS REMIABN WITHIN THE COLON 27-35 CM FROM THE ANAL  VERGE. Small internal hemorrhoids were found.  PREP QUALITY: good.  CECAL W/D TIME: 37 minutes     COMPLICATIONS: None  ENDOSCOPIC IMPRESSION: 1.   Normal mucosa in the terminal ileum 2.   Four COLON POLYPS REMOVED 3.  27-35 CM FROM THE ANAL VERGE: Half-circumferential SIGMOID COLON MASS & FOUR COLON POLYPS 4.  Small internal hemorrhoids   RECOMMENDATIONS: LABS: CMP/PT/INR/CEA/CBC TODAY.  PT WILL NEED CT/CXR IF COLON CA CONFIRMED. NO MRI FOR 30 DAYS. BIOPSY WILL BE BACK IN 3-5 DAYS. REPEAT COLONOSCOPY IN 6 MOS TO 1 YEAR.  ALL FIRST DEGREE RELATIVES NEED A COLONOSCOPY AT AGE 43 AND THEN EVERY 5 YEARS.       _______________________________ Rosalie DoctorJonette Eva, MD 04/29/2013 11:49 AM     PATIENT NAME:  [name], [name] MR#: [id number]

## 2013-04-29 NOTE — H&P (Signed)
  Primary Care Physician:  Kirk Ruths, MD Primary Gastroenterologist:  Dr. Darrick Penna  Pre-Procedure History & Physical: HPI:  [name] is a 57 y.o. male here for COLON CANCER SCREENING.  Past Medical History  Diagnosis Date  . Hypertension   . Renal disorder     Kidney stones    Past Surgical History  Procedure Laterality Date  . Lithotripsy    . Kidney stone surgery    . Cyst removed from left elbow      Prior to Admission medications   Medication Sig Start Date End Date Taking? Authorizing Provider  lisinopril (PRINIVIL,ZESTRIL) 10 MG tablet Take 10 mg by mouth daily.   Yes Historical Provider, MD  Multiple Vitamin (MULTIVITAMIN) tablet Take 1 tablet by mouth daily.   Yes Historical Provider, MD  Sod Picosulfate-Mag Ox-Cit Acd 10-3.5-12 MG-GM-GM PACK Take 1 Container by mouth as directed. 04/03/13  Yes West Bali, MD    Allergies as of 04/02/2013 - Review Complete 04/02/2013  Allergen Reaction Noted  . Penicillins  01/13/2011    Family History  Problem Relation Age of Onset  . Colon cancer Neg Hx     History   Social History  . Marital Status: Married    Spouse Name: N/A    Number of Children: N/A  . Years of Education: N/A   Occupational History  . Not on file.   Social History Main Topics  . Smoking status: Current Every Day Smoker -- 1.00 packs/day for 20 years    Types: Cigarettes  . Smokeless tobacco: Not on file  . Alcohol Use: 1.2 oz/week    2 Cans of beer per week  . Drug Use: No  . Sexual Activity: Not on file   Other Topics Concern  . Not on file   Social History Narrative  . No narrative on file    Review of Systems: See HPI, otherwise negative ROS   Physical Exam: BP 147/98  Pulse 87  Temp(Src) 97.8 F (36.6 C) (Oral)  Resp 20  Ht 6\' 1"  (1.854 m)  Wt 250 lb (113.399 kg)  BMI 32.99 kg/m2  SpO2 95% General:   Alert,  pleasant and cooperative in NAD Head:  Normocephalic and atraumatic. Neck:  Supple; Lungs:   Clear throughout to auscultation.    Heart:  Regular rate and rhythm. Abdomen:  Soft, nontender and nondistended. Normal bowel sounds, without guarding, and without rebound.   Neurologic:  Alert and  oriented x4;  grossly normal neurologically.  Impression/Plan:     SCREENING  Plan:  1. TCS TODAY

## 2013-05-02 ENCOUNTER — Encounter (HOSPITAL_COMMUNITY): Payer: Self-pay | Admitting: Gastroenterology

## 2013-05-05 ENCOUNTER — Telehealth: Payer: Self-pay | Admitting: Gastroenterology

## 2013-05-05 NOTE — Telephone Encounter (Signed)
Called patient TO DISCUSS RESULTS. HE had multiple simple adenomas removed from HIS colon. One large one remains in his sigmoid colon. Discussed lab results. Pt NEEDS EDDOSCOPIC MUCOASAL RESECTION BY [facility]. HE PREFERS TO HAVE IT DONE AFTER JAN 1. SUBSEQUENT SURVEILLANCE WILL BE BASED ON POLYP TYPE IN SIGMOID COLON . PT WILL MOST LIKELY NEED TCS IN 6 MOS TO ONE YEAR.  NO MRI FOR 30 DAYS. OPV WITH DR. FIELDS IN FEB 2015. ALL FIRST DEGREE RELATIVES NEED A COLONOSCOPY AT AGE 50 AND THEN EVERY 5 YEARS.

## 2013-05-06 NOTE — Telephone Encounter (Signed)
REVIEWED.  

## 2013-05-06 NOTE — Telephone Encounter (Signed)
Referral has been faxed to [facility] and they will contact [name] to schedule date & time

## 2013-05-06 NOTE — Telephone Encounter (Signed)
Reminder in epic °

## 2013-05-31 NOTE — Telephone Encounter (Signed)
Mr. Viviano is scheduled at [facility] for Endoscopic Mucoasal Resection on Tuesday Feb 2nd

## 2013-07-04 ENCOUNTER — Telehealth: Payer: Self-pay

## 2013-07-04 NOTE — Telephone Encounter (Signed)
Pt called and said that he had procedure done at [facility] on 06/24/2013. He called them about getting a copy of his pathology report for his cancer  Insurance policy. He said they told him that we have access to his records and we could give him a copy.  Please advise!

## 2013-07-04 NOTE — Telephone Encounter (Signed)
Path results are on your chair and gave Tamela Oddi a copy to mail to pt.

## 2013-07-04 NOTE — Telephone Encounter (Signed)
PLEASE OBTAIN REPORT OF TCS/SURGERY 2015 FROM [facility]. COPY AND GIVE PT A COPY. LEAVE A COPY ON MY CHAIR.

## 2013-07-05 NOTE — Telephone Encounter (Signed)
REVIEWED.  

## 2013-07-10 NOTE — Telephone Encounter (Signed)
I called pt and he wants to drop by the office to pick it up. He is leaving [place] now and will be here in about 3:30 pm. He is aware we will close early today.

## 2013-08-09 ENCOUNTER — Encounter: Payer: Self-pay | Admitting: Gastroenterology

## 2013-08-09 NOTE — Telephone Encounter (Signed)
Recall has been nicd in the computer

## 2013-08-09 NOTE — Telephone Encounter (Addendum)
Pt had lap assisted colectomy for colon cancer. Pt need TCS IN DEC 2015.

## 2013-11-13 ENCOUNTER — Encounter: Payer: Self-pay | Admitting: Gastroenterology

## 2014-03-07 ENCOUNTER — Other Ambulatory Visit: Payer: Self-pay

## 2014-04-01 ENCOUNTER — Encounter: Payer: Self-pay | Admitting: Gastroenterology

## 2015-08-26 DIAGNOSIS — C189 Malignant neoplasm of colon, unspecified: Secondary | ICD-10-CM | POA: Diagnosis not present

## 2015-10-12 DIAGNOSIS — C19 Malignant neoplasm of rectosigmoid junction: Secondary | ICD-10-CM | POA: Diagnosis not present

## 2015-10-12 DIAGNOSIS — C187 Malignant neoplasm of sigmoid colon: Secondary | ICD-10-CM | POA: Diagnosis not present

## 2015-10-12 DIAGNOSIS — F17200 Nicotine dependence, unspecified, uncomplicated: Secondary | ICD-10-CM | POA: Diagnosis not present

## 2015-10-12 DIAGNOSIS — I10 Essential (primary) hypertension: Secondary | ICD-10-CM | POA: Diagnosis not present

## 2016-04-05 DIAGNOSIS — Z6833 Body mass index (BMI) 33.0-33.9, adult: Secondary | ICD-10-CM | POA: Diagnosis not present

## 2016-04-05 DIAGNOSIS — R7309 Other abnormal glucose: Secondary | ICD-10-CM | POA: Diagnosis not present

## 2016-04-05 DIAGNOSIS — I10 Essential (primary) hypertension: Secondary | ICD-10-CM | POA: Diagnosis not present

## 2016-04-05 DIAGNOSIS — Z1389 Encounter for screening for other disorder: Secondary | ICD-10-CM | POA: Diagnosis not present

## 2016-04-05 DIAGNOSIS — Z23 Encounter for immunization: Secondary | ICD-10-CM | POA: Diagnosis not present

## 2016-04-05 DIAGNOSIS — E782 Mixed hyperlipidemia: Secondary | ICD-10-CM | POA: Diagnosis not present

## 2016-04-21 DIAGNOSIS — Z23 Encounter for immunization: Secondary | ICD-10-CM | POA: Diagnosis not present

## 2016-09-08 DIAGNOSIS — C4441 Basal cell carcinoma of skin of scalp and neck: Secondary | ICD-10-CM | POA: Diagnosis not present

## 2016-09-08 DIAGNOSIS — Z08 Encounter for follow-up examination after completed treatment for malignant neoplasm: Secondary | ICD-10-CM | POA: Diagnosis not present

## 2016-09-08 DIAGNOSIS — Z85828 Personal history of other malignant neoplasm of skin: Secondary | ICD-10-CM | POA: Diagnosis not present

## 2016-10-06 DIAGNOSIS — I10 Essential (primary) hypertension: Secondary | ICD-10-CM | POA: Diagnosis not present

## 2016-10-06 DIAGNOSIS — R600 Localized edema: Secondary | ICD-10-CM | POA: Diagnosis not present

## 2016-10-06 DIAGNOSIS — Z6832 Body mass index (BMI) 32.0-32.9, adult: Secondary | ICD-10-CM | POA: Diagnosis not present

## 2016-10-06 DIAGNOSIS — I872 Venous insufficiency (chronic) (peripheral): Secondary | ICD-10-CM | POA: Diagnosis not present

## 2016-10-06 DIAGNOSIS — Z1389 Encounter for screening for other disorder: Secondary | ICD-10-CM | POA: Diagnosis not present

## 2016-10-06 DIAGNOSIS — I8393 Asymptomatic varicose veins of bilateral lower extremities: Secondary | ICD-10-CM | POA: Diagnosis not present

## 2017-04-28 DIAGNOSIS — H1032 Unspecified acute conjunctivitis, left eye: Secondary | ICD-10-CM | POA: Diagnosis not present

## 2017-04-28 DIAGNOSIS — Z23 Encounter for immunization: Secondary | ICD-10-CM | POA: Diagnosis not present

## 2017-04-28 DIAGNOSIS — Z1389 Encounter for screening for other disorder: Secondary | ICD-10-CM | POA: Diagnosis not present

## 2017-04-28 DIAGNOSIS — Z6832 Body mass index (BMI) 32.0-32.9, adult: Secondary | ICD-10-CM | POA: Diagnosis not present

## 2017-04-28 DIAGNOSIS — E6609 Other obesity due to excess calories: Secondary | ICD-10-CM | POA: Diagnosis not present

## 2017-12-28 DIAGNOSIS — E782 Mixed hyperlipidemia: Secondary | ICD-10-CM | POA: Diagnosis not present

## 2017-12-28 DIAGNOSIS — Z6833 Body mass index (BMI) 33.0-33.9, adult: Secondary | ICD-10-CM | POA: Diagnosis not present

## 2017-12-28 DIAGNOSIS — E6609 Other obesity due to excess calories: Secondary | ICD-10-CM | POA: Diagnosis not present

## 2017-12-28 DIAGNOSIS — R7309 Other abnormal glucose: Secondary | ICD-10-CM | POA: Diagnosis not present

## 2017-12-28 DIAGNOSIS — Z1389 Encounter for screening for other disorder: Secondary | ICD-10-CM | POA: Diagnosis not present

## 2017-12-28 DIAGNOSIS — I10 Essential (primary) hypertension: Secondary | ICD-10-CM | POA: Diagnosis not present

## 2018-01-31 ENCOUNTER — Ambulatory Visit (HOSPITAL_COMMUNITY)
Admission: RE | Admit: 2018-01-31 | Discharge: 2018-01-31 | Disposition: A | Discharge status: Home / Self Care | Payer: BLUE CROSS/BLUE SHIELD | Source: Ambulatory Visit | Attending: Family Medicine | Admitting: Family Medicine

## 2018-01-31 ENCOUNTER — Other Ambulatory Visit (HOSPITAL_COMMUNITY): Payer: Self-pay | Admitting: Family Medicine

## 2018-01-31 DIAGNOSIS — R0789 Other chest pain: Secondary | ICD-10-CM

## 2018-01-31 DIAGNOSIS — J42 Unspecified chronic bronchitis: Secondary | ICD-10-CM | POA: Insufficient documentation

## 2018-01-31 DIAGNOSIS — Z6833 Body mass index (BMI) 33.0-33.9, adult: Secondary | ICD-10-CM | POA: Diagnosis not present

## 2018-01-31 DIAGNOSIS — E6609 Other obesity due to excess calories: Secondary | ICD-10-CM | POA: Insufficient documentation

## 2018-01-31 DIAGNOSIS — J9811 Atelectasis: Secondary | ICD-10-CM | POA: Insufficient documentation

## 2018-01-31 DIAGNOSIS — Z1389 Encounter for screening for other disorder: Secondary | ICD-10-CM | POA: Insufficient documentation

## 2018-01-31 DIAGNOSIS — R079 Chest pain, unspecified: Secondary | ICD-10-CM | POA: Diagnosis not present

## 2018-01-31 DIAGNOSIS — R202 Paresthesia of skin: Secondary | ICD-10-CM | POA: Diagnosis not present

## 2018-01-31 DIAGNOSIS — F17200 Nicotine dependence, unspecified, uncomplicated: Secondary | ICD-10-CM | POA: Diagnosis not present

## 2018-01-31 DIAGNOSIS — R0600 Dyspnea, unspecified: Secondary | ICD-10-CM | POA: Diagnosis not present

## 2018-02-01 ENCOUNTER — Emergency Department (HOSPITAL_COMMUNITY): Payer: BLUE CROSS/BLUE SHIELD

## 2018-02-01 ENCOUNTER — Observation Stay (HOSPITAL_COMMUNITY)
Admission: EM | Admit: 2018-02-01 | Discharge: 2018-02-02 | Disposition: A | Discharge status: Home / Self Care | Payer: BLUE CROSS/BLUE SHIELD | Attending: Internal Medicine | Admitting: Internal Medicine

## 2018-02-01 ENCOUNTER — Observation Stay (HOSPITAL_BASED_OUTPATIENT_CLINIC_OR_DEPARTMENT_OTHER): Payer: BLUE CROSS/BLUE SHIELD

## 2018-02-01 ENCOUNTER — Other Ambulatory Visit: Payer: Self-pay

## 2018-02-01 ENCOUNTER — Encounter (HOSPITAL_COMMUNITY): Payer: Self-pay

## 2018-02-01 DIAGNOSIS — I2510 Atherosclerotic heart disease of native coronary artery without angina pectoris: Secondary | ICD-10-CM

## 2018-02-01 DIAGNOSIS — J449 Chronic obstructive pulmonary disease, unspecified: Secondary | ICD-10-CM | POA: Diagnosis not present

## 2018-02-01 DIAGNOSIS — R079 Chest pain, unspecified: Principal | ICD-10-CM | POA: Diagnosis present

## 2018-02-01 DIAGNOSIS — F17210 Nicotine dependence, cigarettes, uncomplicated: Secondary | ICD-10-CM | POA: Diagnosis not present

## 2018-02-01 DIAGNOSIS — Z85038 Personal history of other malignant neoplasm of large intestine: Secondary | ICD-10-CM | POA: Diagnosis not present

## 2018-02-01 DIAGNOSIS — Z79899 Other long term (current) drug therapy: Secondary | ICD-10-CM | POA: Diagnosis not present

## 2018-02-01 DIAGNOSIS — R0602 Shortness of breath: Secondary | ICD-10-CM | POA: Diagnosis not present

## 2018-02-01 DIAGNOSIS — I10 Essential (primary) hypertension: Secondary | ICD-10-CM | POA: Diagnosis not present

## 2018-02-01 DIAGNOSIS — J438 Other emphysema: Secondary | ICD-10-CM | POA: Diagnosis not present

## 2018-02-01 DIAGNOSIS — E785 Hyperlipidemia, unspecified: Secondary | ICD-10-CM | POA: Diagnosis not present

## 2018-02-01 DIAGNOSIS — Z720 Tobacco use: Secondary | ICD-10-CM

## 2018-02-01 DIAGNOSIS — R0789 Other chest pain: Secondary | ICD-10-CM

## 2018-02-01 DIAGNOSIS — J441 Chronic obstructive pulmonary disease with (acute) exacerbation: Secondary | ICD-10-CM

## 2018-02-01 DIAGNOSIS — E782 Mixed hyperlipidemia: Secondary | ICD-10-CM | POA: Diagnosis not present

## 2018-02-01 DIAGNOSIS — I700 Atherosclerosis of aorta: Secondary | ICD-10-CM | POA: Diagnosis not present

## 2018-02-01 LAB — COMPREHENSIVE METABOLIC PANEL
ALT: 36 U/L (ref 0–44)
AST: 25 U/L (ref 15–41)
Albumin: 4.2 g/dL (ref 3.5–5.0)
Alkaline Phosphatase: 66 U/L (ref 38–126)
Anion gap: 10 (ref 5–15)
BUN: 21 mg/dL (ref 8–23)
CHLORIDE: 104 mmol/L (ref 98–111)
CO2: 27 mmol/L (ref 22–32)
Calcium: 9.3 mg/dL (ref 8.9–10.3)
Creatinine, Ser: 1.11 mg/dL (ref 0.61–1.24)
GFR calc Af Amer: >60 mL/min (ref >60)
GFR calc non Af Amer: >60 mL/min (ref >60)
Glucose, Bld: 96 mg/dL (ref 70–99)
Potassium: 3.8 mmol/L (ref 3.5–5.1)
Sodium: 141 mmol/L (ref 135–145)
Total Bilirubin: 0.5 mg/dL (ref 0.3–1.2)
Total Protein: 7.5 g/dL (ref 6.5–8.1)

## 2018-02-01 LAB — CBC WITH DIFFERENTIAL/PLATELET
Basophils Absolute: 0 10*3/uL (ref 0.0–0.1)
Basophils Relative: 0 %
EOS PCT: 1 %
Eosinophils Absolute: 0.1 10*3/uL (ref 0.0–0.7)
HEMATOCRIT: 47 % (ref 39.0–52.0)
HEMOGLOBIN: 15.9 g/dL (ref 13.0–17.0)
LYMPHS ABS: 2.6 10*3/uL (ref 0.7–4.0)
LYMPHS PCT: 43 %
MCH: 34.3 pg — ABNORMAL HIGH (ref 26.0–34.0)
MCHC: 33.8 g/dL (ref 30.0–36.0)
MCV: 101.3 fL — AB (ref 78.0–100.0)
MONO ABS: 0.8 10*3/uL (ref 0.1–1.0)
MONOS PCT: 13 %
Neutro Abs: 2.6 10*3/uL (ref 1.7–7.7)
Neutrophils Relative %: 43 %
PLATELETS: 162 10*3/uL (ref 150–400)
RBC: 4.64 MIL/uL (ref 4.22–5.81)
RDW: 12.6 % (ref 11.5–15.5)
WBC: 6.1 10*3/uL (ref 4.0–10.5)

## 2018-02-01 LAB — POCT I-STAT TROPONIN I: TROPONIN I, POC: 0 ng/mL (ref 0.00–0.08)

## 2018-02-01 LAB — TROPONIN I
Troponin I: <0.03 ng/mL (ref <0.03)
Troponin I: <0.03 ng/mL (ref <0.03)

## 2018-02-01 LAB — ECHOCARDIOGRAM COMPLETE

## 2018-02-01 LAB — LIPID PANEL
Cholesterol: 125 mg/dL (ref 0–200)
HDL: 47 mg/dL (ref >40)
LDL CALC: 47 mg/dL (ref 0–99)
TRIGLYCERIDES: 156 mg/dL — AB (ref <150)
Total CHOL/HDL Ratio: 2.7 RATIO
VLDL: 31 mg/dL (ref 0–40)

## 2018-02-01 LAB — BRAIN NATRIURETIC PEPTIDE: B Natriuretic Peptide: 48 pg/mL (ref 0.0–100.0)

## 2018-02-01 LAB — GLUCOSE, CAPILLARY: Glucose-Capillary: 121 mg/dL — ABNORMAL HIGH (ref 70–99)

## 2018-02-01 MED ORDER — ONDANSETRON HCL 4 MG PO TABS: 4.0000 mg | ORAL_TABLET | Freq: Four times a day (QID) | ORAL | Status: DC | PRN

## 2018-02-01 MED ORDER — IPRATROPIUM-ALBUTEROL 0.5-2.5 (3) MG/3ML IN SOLN
3.0000 mL | Freq: Four times a day (QID) | RESPIRATORY_TRACT | Status: DC
  Administered 2018-02-01: 3 mL via RESPIRATORY_TRACT
  Filled 2018-02-01: qty 3

## 2018-02-01 MED ORDER — ACETAMINOPHEN 325 MG PO TABS
650.0000 mg | ORAL_TABLET | Freq: Four times a day (QID) | ORAL | Status: DC | PRN
  Administered 2018-02-01: 650 mg via ORAL
  Filled 2018-02-01: qty 2

## 2018-02-01 MED ORDER — LISINOPRIL 10 MG PO TABS
10.0000 mg | ORAL_TABLET | Freq: Every day | ORAL | Status: DC
  Administered 2018-02-01 – 2018-02-02 (×2): 10 mg via ORAL
  Filled 2018-02-01 (×2): qty 1

## 2018-02-01 MED ORDER — MORPHINE SULFATE (PF) 4 MG/ML IV SOLN
4.0000 mg | Freq: Once | INTRAVENOUS | Status: AC
  Administered 2018-02-01: 4 mg via INTRAVENOUS

## 2018-02-01 MED ORDER — ASPIRIN EC 81 MG PO TBEC
81.0000 mg | DELAYED_RELEASE_TABLET | Freq: Every day | ORAL | Status: DC
  Administered 2018-02-02: 81 mg via ORAL
  Filled 2018-02-01: qty 1

## 2018-02-01 MED ORDER — ACETAMINOPHEN 650 MG RE SUPP: 650.0000 mg | Freq: Four times a day (QID) | RECTAL | Status: DC | PRN

## 2018-02-01 MED ORDER — IPRATROPIUM-ALBUTEROL 0.5-2.5 (3) MG/3ML IN SOLN
3.0000 mL | Freq: Two times a day (BID) | RESPIRATORY_TRACT | Status: DC
  Administered 2018-02-01 – 2018-02-02 (×2): 3 mL via RESPIRATORY_TRACT
  Filled 2018-02-01 (×2): qty 3

## 2018-02-01 MED ORDER — ALBUTEROL (5 MG/ML) CONTINUOUS INHALATION SOLN
5.0000 mg/h | INHALATION_SOLUTION | Freq: Once | RESPIRATORY_TRACT | Status: AC
  Administered 2018-02-01: 5 mg/h via RESPIRATORY_TRACT

## 2018-02-01 MED ORDER — NITROGLYCERIN 0.4 MG SL SUBL: 0.4000 mg | SUBLINGUAL_TABLET | SUBLINGUAL | Status: DC | PRN

## 2018-02-01 MED ORDER — ALBUTEROL SULFATE (2.5 MG/3ML) 0.083% IN NEBU
5.0000 mg | INHALATION_SOLUTION | Freq: Once | RESPIRATORY_TRACT | Status: AC
  Administered 2018-02-01: 5 mg via RESPIRATORY_TRACT

## 2018-02-01 MED ORDER — ENOXAPARIN SODIUM 40 MG/0.4ML ~~LOC~~ SOLN
40.0000 mg | SUBCUTANEOUS | Status: DC
  Administered 2018-02-01: 40 mg via SUBCUTANEOUS
  Filled 2018-02-01 (×2): qty 0.4

## 2018-02-01 MED ORDER — BUDESONIDE 0.5 MG/2ML IN SUSP
0.5000 mg | Freq: Two times a day (BID) | RESPIRATORY_TRACT | Status: DC
  Administered 2018-02-01 – 2018-02-02 (×3): 0.5 mg via RESPIRATORY_TRACT
  Filled 2018-02-01 (×3): qty 2

## 2018-02-01 MED ORDER — ASPIRIN 325 MG PO TABS
325.0000 mg | ORAL_TABLET | Freq: Every day | ORAL | Status: DC
  Filled 2018-02-01: qty 1

## 2018-02-01 MED ORDER — IOPAMIDOL (ISOVUE-370) INJECTION 76%
100.0000 mL | Freq: Once | INTRAVENOUS | Status: AC | PRN
  Administered 2018-02-01: 100 mL via INTRAVENOUS

## 2018-02-01 MED ORDER — ONDANSETRON HCL 4 MG/2ML IJ SOLN: 4.0000 mg | Freq: Four times a day (QID) | INTRAMUSCULAR | Status: DC | PRN

## 2018-02-01 MED ORDER — PERFLUTREN LIPID MICROSPHERE
1.0000 mL | INTRAVENOUS | Status: AC | PRN
  Administered 2018-02-01: 2 mL via INTRAVENOUS
  Filled 2018-02-01: qty 10

## 2018-02-01 NOTE — ED Triage Notes (Signed)
Pt states chest pain woke him from sleep. Sharp pressure mid chest that somewhat radiates towards left arm/shoulder area. Denies headache/n/v/d, cardiac hx, or resp. Hx. Pt is a pack a day smoker.

## 2018-02-01 NOTE — Progress Notes (Signed)
*  PRELIMINARY RESULTS* Echocardiogram 2D Echocardiogramwith definity has been performed.  Leavy Cella 02/01/2018, 11:39 AM

## 2018-02-01 NOTE — Consult Note (Signed)
Cardiology Consultation:   Patient ID: [name] MRN: [id number]; DOB: [birth year]  Admit date: 02/01/2018 Date of Consult: 02/01/2018  Primary Care Provider: [name], MD Primary Cardiologist: New Primary Electrophysiologist: none   Patient Profile:   [name] is a 62 y.o. male with a hx of COPD and HTN who is being seen today for the evaluation of chest pain at the request of Dr Tat  History of Present Illness:   [name] history of colon cancer, COPD, HTN, tobacco abuse, admitted with chest pain. On my history he reprots 3-4 weeks of left sided tightness/aching pain. Can occur at rest or with activity. No other associated symptoms. Can be worst with position. Can last a few minutes, at its longest can last a few hours in a row.     BNP 48 WBC 15.9 Plt 162 K 3.8 Cr 1.11 LDL 47  Trop neg x 2 CXR no acute process Echo: LVEF 60-65%, no WMAs EKG SR, occasional PACs CT PE no PE, coronary calcfications noted    Past Medical History:  Diagnosis Date  . Colon cancer ([place]) [place] 2014  . Hypertension   . Renal disorder    Kidney stones    Past Surgical History:  Procedure Laterality Date  . COLONOSCOPY N/A 04/29/2013   Procedure: COLONOSCOPY;  Surgeon: Danie Binder, MD;  Location: [facility];  Service: Endoscopy;  Laterality: N/A;  10:30AM  . Cyst removed from left elbow    . KIDNEY STONE SURGERY    . LITHOTRIPSY    . PARTIAL COLECTOMY       Inpatient Medications: Scheduled Meds: . [START ON 02/02/2018] aspirin  325 mg Oral Daily  . budesonide (PULMICORT) nebulizer solution  0.5 mg Nebulization BID  . enoxaparin (LOVENOX) injection  40 mg Subcutaneous Q24H  . ipratropium-albuterol  3 mL Nebulization Q6H  . lisinopril  10 mg Oral Daily   Continuous Infusions:  PRN Meds: acetaminophen **OR** acetaminophen, ondansetron **OR** ondansetron (ZOFRAN) IV, perflutren lipid microspheres (DEFINITY) IV suspension  Allergies:    Allergies    Allergen Reactions  . Penicillins Other (See Comments)    Childhood Allergy  Has patient had a PCN reaction causing immediate rash, facial/tongue/throat swelling, SOB or lightheadedness with hypotension: No Has patient had a PCN reaction causing severe rash involving mucus membranes or skin necrosis: No Has patient had a PCN reaction that required hospitalization: No Has patient had a PCN reaction occurring within the last 10 years: No If all of the above answers are "NO", then may proceed with Cephalosporin use.     Social History:   Social History   Socioeconomic History  . Marital status: Married    Spouse name: Not on file  . Number of children: Not on file  . Years of education: Not on file  . Highest education level: Not on file  Occupational History  . Not on file  Social Needs  . Financial resource strain: Not on file  . Food insecurity:    Worry: Not on file    Inability: Not on file  . Transportation needs:    Medical: Not on file    Non-medical: Not on file  Tobacco Use  . Smoking status: Current Every Day Smoker    Packs/day: 1.00    Years: 20.00    Pack years: 20.00    Types: Cigarettes  Substance and Sexual Activity  . Alcohol use: Yes    Alcohol/week: 2.0 standard drinks    Types:  2 Cans of beer per week  . Drug use: No  . Sexual activity: Not on file  Lifestyle  . Physical activity:    Days per week: Not on file    Minutes per session: Not on file  . Stress: Not on file  Relationships  . Social connections:    Talks on phone: Not on file    Gets together: Not on file    Attends religious service: Not on file    Active member of club or organization: Not on file    Attends meetings of clubs or organizations: Not on file    Relationship status: Not on file  . Intimate partner violence:    Fear of current or ex partner: Not on file    Emotionally abused: Not on file    Physically abused: Not on file    Forced sexual activity: Not on file   Other Topics Concern  . Not on file  Social History Narrative  . Not on file    Family History:    Family History  Problem Relation Age of Onset  . Colon cancer Neg Hx      ROS:  Please see the history of present illness.  All other ROS reviewed and negative.     Physical Exam/Data:   Vitals:   02/01/18 0630 02/01/18 0730 02/01/18 0800 02/01/18 1030  BP: 115/86 (!) 132/99 119/78   Pulse: 83 85 79   Resp: 13 19 15    Temp:      TempSrc:      SpO2: 94% 94% 92% 94%   No intake or output data in the 24 hours ending 02/01/18 1247 There were no vitals filed for this visit. There is no height or weight on file to calculate BMI.  General:  Well nourished, well developed, in no acute distress HEENT: normal Lymph: no adenopathy Neck: no JVD Endocrine:  No thryomegaly Cardiac:  normal S1, S2; RRR; no murmur Lungs:  clear to auscultation bilaterally, no wheezing, rhonchi or rales  Abd: soft, nontender, no hepatomegaly  Ext: no edema Musculoskeletal:  No deformities, BUE and BLE strength normal and equal Skin: warm and dry  Neuro:  CNs 2-12 intact, no focal abnormalities noted Psych:  Normal affect     Laboratory Data:  Chemistry Recent Labs  Lab 02/01/18 0309  NA 141  K 3.8  CL 104  CO2 27  GLUCOSE 96  BUN 21  CREATININE 1.11  CALCIUM 9.3  GFRNONAA >60  GFRAA >60  ANIONGAP 10    Recent Labs  Lab 02/01/18 0309  PROT 7.5  ALBUMIN 4.2  AST 25  ALT 36  ALKPHOS 66  BILITOT 0.5   Hematology Recent Labs  Lab 02/01/18 0309  WBC 6.1  RBC 4.64  HGB 15.9  HCT 47.0  MCV 101.3*  MCH 34.3*  MCHC 33.8  RDW 12.6  PLT 162   Cardiac Enzymes Recent Labs  Lab 02/01/18 0953  TROPONINI <0.03    Recent Labs  Lab 02/01/18 0315  TROPIPOC 0.00    BNP Recent Labs  Lab 02/01/18 0309  BNP 48.0    DDimer No results for input(s): DDIMER in the last 168 hours.  Radiology/Studies:  Dg Chest 2 View  Result Date: 01/31/2018 CLINICAL DATA:  3-4 day  history LEFT UPPER ANTERIOR chest pain. No known injury. Current smoker. EXAM: CHEST - 2 VIEW COMPARISON:  03/30/2013, 01/13/2011 and earlier. FINDINGS: Cardiac silhouette normal in size, unchanged. Thoracic aorta mildly atherosclerotic, unchanged. Hilar  and mediastinal contours otherwise unremarkable. Chronic elevation of both hemidiaphragms with associated chronic scar/atelectasis involving the lower lobes. Prominent bronchovascular markings diffusely and moderate central peribronchial thickening, unchanged. Lungs otherwise clear. No localized airspace consolidation. No pleural effusions. No pneumothorax. Normal pulmonary vascularity. Remote healed LEFT rib fractures. Degenerative changes involving the thoracic and UPPER lumbar spine. IMPRESSION: 1.  No acute cardiopulmonary disease. 2. Stable changes of chronic bronchitis and/or asthma. Stable chronic scar/atelectasis involving the lower lobes related to chronic elevation of both hemidiaphragms. Electronically Signed   By: Evangeline Dakin M.D.   On: 01/31/2018 14:06   Ct Angio Chest Pe W Or Wo Contrast  Result Date: 02/01/2018 CLINICAL DATA:  Chest pain and shortness of breath. EXAM: CT ANGIOGRAPHY CHEST WITH CONTRAST TECHNIQUE: Multidetector CT imaging of the chest was performed using the standard protocol during bolus administration of intravenous contrast. Multiplanar CT image reconstructions and MIPs were obtained to evaluate the vascular anatomy. CONTRAST:  19mL ISOVUE-370 IOPAMIDOL (ISOVUE-370) INJECTION 76% COMPARISON:  Radiographs yesterday. FINDINGS: Cardiovascular: There are no filling defects within the pulmonary arteries to suggest pulmonary embolus. Tortuous atherosclerotic thoracic aorta. No aortic dissection. Coronary artery calcifications versus stents. Heart is normal in size. No pericardial effusion. Mediastinum/Nodes: No enlarged mediastinal or hilar lymph nodes. Esophagus is nondistended. Thyroid gland is normal. Lungs/Pleura: Mild to  moderate bronchial thickening, of uncertain acuity. Mild bibasilar atelectasis. Scattered pulmonary cysts. No confluent airspace disease, pulmonary edema or pleural effusion. Upper Abdomen: No acute findings. Musculoskeletal: Degenerative change in the spine. There are no acute or suspicious osseous abnormalities. Review of the MIP images confirms the above findings. IMPRESSION: 1. No pulmonary embolus. 2. Coronary artery calcifications versus stents. 3. Bronchial thickening which may be chronic. Mild bibasilar atelectasis. 4. Scattered pulmonary cysts are nonspecific, likely post infectious or post inflammatory in etiology. Aortic Atherosclerosis (ICD10-I70.0). Electronically Signed   By: Keith Rake M.D.   On: 02/01/2018 07:13    Assessment and Plan:   1. Chest pain/CAD - admitted with chest pain, CT PE with incidental findings of CAD. The functionality of these lesions is unclear - no objective evidence of ACS thus far from EKG and enzymes - echo shows normal LVEF, no WMAs  - fairly atypical description upon my history taking. He has CAD risk factors and evidence of CAD by CT imaging, and does require a functional assessment. We will plan for an exercise nuclear stress test tomorrow.      For questions or updates, please contact [place] Please consult [email or web address] for contact info under     Signed, Carlyle Dolly, MD  02/01/2018 12:47 PM

## 2018-02-01 NOTE — Progress Notes (Signed)
History and Physical  [name] BZJ:[id number] DOB: [birth year] DOA: 02/01/2018   PCP: [name], MD   Patient coming from: Home  Chief Complaint: chest pain  HPI:  [name] is a 62 y.o. male with medical history of colon cancer, hypertension, tobacco abuse, COPD presenting with 1 monthhistory of intermittent chest discomfort and shortness of breath.  The patient went to see his primary care provider on 01/31/2018.  He stated that blood work and chest x-ray were obtained at that time and were unremarkable.  He was not started on any new medications.  Upon further questioning, the patient states that he has had intermittent chest discomfort for the better part of 1 month with decreased activity tolerance.  In addition, he has had intermittent left arm numbness during this period of time.  He describes his chest discomfort as an "aching sensation".  He has a chronic nonproductive cough.  Denies any fevers, chills, nausea, vomiting, diarrhea, diaphoresis.  He continues to smoke 1 pack/day.  He has a nearly 80-pack-year history.  On early morning of 02/01/2018, the patient woke up with worsening chest discomfort and shortness of breath.  As result, he came to the emergency department for further evaluation. In the emergency department, patient was afebrile hemodynamically stable saturating 97% on room air.  BMP, LFTs, and CBC were essentially unremarkable.  BNP was 48.  EKG showed sinus rhythm with nonspecific T wave changes.  CT angiogram of the chest was negative for pulmonary embolus but showed coronary calcifications and mild to moderate bronchial thickening.  Assessment/Plan: Chest pain -Concerning for angina -Cardiology consult -Cycle troponins -Echocardiogram -EKG--sinus rhythm, nonspecific T wave change -02/01/2018 CTA chest--negative for PE, but showed coronary calcifications -Continue aspirin  Tobacco abuse/COPD -start duonebs -start pulmicort -I have  discussed tobacco cessation with the patient.  I have counseled the patient regarding the negative impacts of continued tobacco use including but not limited to lung cancer, COPD, and cardiovascular disease.  I have discussed alternatives to tobacco and modalities that may help facilitate tobacco cessation including but not limited to biofeedback, hypnosis, and medications.  Total time spent with tobacco counseling was 4 minutes.  Essential hypertension -Continue lisinopril  Hyperlipidemia -Restart atorvastatin once the patient is able to clarify dosing        Past Medical History:  Diagnosis Date  . Colon cancer ([place]) [place] 2014  . Hypertension   . Renal disorder    Kidney stones   Past Surgical History:  Procedure Laterality Date  . COLONOSCOPY N/A 04/29/2013   Procedure: COLONOSCOPY;  Surgeon: Danie Binder, MD;  Location: [facility];  Service: Endoscopy;  Laterality: N/A;  10:30AM  . Cyst removed from left elbow    . KIDNEY STONE SURGERY    . LITHOTRIPSY    . PARTIAL COLECTOMY     Social History:  reports that he has been smoking cigarettes. He has a 20.00 pack-year smoking history. He does not have any smokeless tobacco history on file. He reports that he drinks about 2.0 standard drinks of alcohol per week. He reports that he does not use drugs.   Family History  Problem Relation Age of Onset  . Colon cancer Neg Hx      Allergies  Allergen Reactions  . Penicillins Other (See Comments)    Childhood Allergy      Prior to Admission medications   Medication Sig Start Date End Date Taking? Authorizing Provider  lisinopril (PRINIVIL,ZESTRIL) 10 MG  tablet Take 10 mg by mouth daily.    [provider]  Multiple Vitamin (MULTIVITAMIN) tablet Take 1 tablet by mouth daily.    [provider]    Review of Systems:  Constitutional:  No weight loss, night sweats, Fevers, chill Head&Eyes: No headache.  No vision loss.  No eye pain or scotoma ENT:    No Difficulty swallowing,Tooth/dental problems,Sore throat,  No ear ache, post nasal drip,  Cardio-vascular:  No  Orthopnea, PND, swelling in lower extremities,  dizziness, palpitations  GI:  No  abdominal pain, nausea, vomiting, diarrhea, loss of appetite, hematochezia, melena, heartburn, indigestion, Resp:   No coughing up of blood .No wheezing.No chest wall deformity  Skin:  no rash or lesions.  GU:  no dysuria, change in color of urine, no urgency or frequency. No flank pain.  Musculoskeletal:  No joint pain or swelling. No decreased range of motion. No back pain.  Psych:  No change in mood or affect. No depression or anxiety. Neurologic: No headache, no dysesthesia, no focal weakness, no vision loss. No syncope  Physical Exam: Vitals:   02/01/18 0315 02/01/18 0607 02/01/18 0630 02/01/18 0730  BP:  119/84 115/86 (!) 132/99  Pulse:  92 83 85  Resp:  16 13 19   Temp:  (!) 97.5 F (36.4 C)    TempSrc:  Oral    SpO2: 100% 97% 94% 94%   General:  A&O x 3, NAD, nontoxic, pleasant/cooperative Head/Eye: No conjunctival hemorrhage, no icterus, [place]/AT, No nystagmus ENT:  No icterus,  No thrush, good dentition, no pharyngeal exudate Neck:  No masses, no lymphadenpathy, no bruits CV:  RRR, no rub, no gallop, no S3 Lung: Bilateral scattered rales.  Bibasilar wheezing.  Good air movement. Abdomen: soft/NT, +BS, nondistended, no peritoneal signs Ext: No cyanosis, No rashes, No petechiae, No lymphangitis, No edema Neuro: CNII-XII intact, strength 4/5 in bilateral upper and lower extremities, no dysmetria  Labs on Admission:  Basic Metabolic Panel: Recent Labs  Lab 02/01/18 0309  NA 141  K 3.8  CL 104  CO2 27  GLUCOSE 96  BUN 21  CREATININE 1.11  CALCIUM 9.3   Liver Function Tests: Recent Labs  Lab 02/01/18 0309  AST 25  ALT 36  ALKPHOS 66  BILITOT 0.5  PROT 7.5  ALBUMIN 4.2   No results for input(s): LIPASE, AMYLASE in the last 168 hours. No results for input(s):  AMMONIA in the last 168 hours. CBC: Recent Labs  Lab 02/01/18 0309  WBC 6.1  NEUTROABS 2.6  HGB 15.9  HCT 47.0  MCV 101.3*  PLT 162   Coagulation Profile: No results for input(s): INR, PROTIME in the last 168 hours. Cardiac Enzymes: No results for input(s): CKTOTAL, CKMB, CKMBINDEX, TROPONINI in the last 168 hours. BNP: Invalid input(s): POCBNP CBG: No results for input(s): GLUCAP in the last 168 hours. Urine analysis:    Component Value Date/Time   COLORURINE YELLOW 10/02/2008 1937   APPEARANCEUR CLEAR 10/02/2008 1937   LABSPEC 1.020 10/02/2008 1937   PHURINE 6.0 10/02/2008 1937   GLUCOSEU NEGATIVE 10/02/2008 1937   HGBUR NEGATIVE 10/02/2008 1937   BILIRUBINUR NEGATIVE 10/02/2008 1937   KETONESUR NEGATIVE 10/02/2008 1937   PROTEINUR NEGATIVE 10/02/2008 1937   UROBILINOGEN 0.2 10/02/2008 1937   NITRITE NEGATIVE 10/02/2008 1937   LEUKOCYTESUR  10/02/2008 1937    NEGATIVE MICROSCOPIC NOT DONE ON URINES WITH NEGATIVE PROTEIN, BLOOD, LEUKOCYTES, NITRITE, OR GLUCOSE <1000 mg/dL.   Sepsis Labs: @LABRCNTIP (procalcitonin:4,lacticidven:4) )No results found for this  or any previous visit (from the past 240 hour(s)).   Radiological Exams on Admission: Dg Chest 2 View  Result Date: 01/31/2018 CLINICAL DATA:  3-4 day history LEFT UPPER ANTERIOR chest pain. No known injury. Current smoker. EXAM: CHEST - 2 VIEW COMPARISON:  03/30/2013, 01/13/2011 and earlier. FINDINGS: Cardiac silhouette normal in size, unchanged. Thoracic aorta mildly atherosclerotic, unchanged. Hilar and mediastinal contours otherwise unremarkable. Chronic elevation of both hemidiaphragms with associated chronic scar/atelectasis involving the lower lobes. Prominent bronchovascular markings diffusely and moderate central peribronchial thickening, unchanged. Lungs otherwise clear. No localized airspace consolidation. No pleural effusions. No pneumothorax. Normal pulmonary vascularity. Remote healed LEFT rib fractures.  Degenerative changes involving the thoracic and UPPER lumbar spine. IMPRESSION: 1.  No acute cardiopulmonary disease. 2. Stable changes of chronic bronchitis and/or asthma. Stable chronic scar/atelectasis involving the lower lobes related to chronic elevation of both hemidiaphragms. Electronically Signed   By: Evangeline Dakin M.D.   On: 01/31/2018 14:06   Ct Angio Chest Pe W Or Wo Contrast  Result Date: 02/01/2018 CLINICAL DATA:  Chest pain and shortness of breath. EXAM: CT ANGIOGRAPHY CHEST WITH CONTRAST TECHNIQUE: Multidetector CT imaging of the chest was performed using the standard protocol during bolus administration of intravenous contrast. Multiplanar CT image reconstructions and MIPs were obtained to evaluate the vascular anatomy. CONTRAST:  143mL ISOVUE-370 IOPAMIDOL (ISOVUE-370) INJECTION 76% COMPARISON:  Radiographs yesterday. FINDINGS: Cardiovascular: There are no filling defects within the pulmonary arteries to suggest pulmonary embolus. Tortuous atherosclerotic thoracic aorta. No aortic dissection. Coronary artery calcifications versus stents. Heart is normal in size. No pericardial effusion. Mediastinum/Nodes: No enlarged mediastinal or hilar lymph nodes. Esophagus is nondistended. Thyroid gland is normal. Lungs/Pleura: Mild to moderate bronchial thickening, of uncertain acuity. Mild bibasilar atelectasis. Scattered pulmonary cysts. No confluent airspace disease, pulmonary edema or pleural effusion. Upper Abdomen: No acute findings. Musculoskeletal: Degenerative change in the spine. There are no acute or suspicious osseous abnormalities. Review of the MIP images confirms the above findings. IMPRESSION: 1. No pulmonary embolus. 2. Coronary artery calcifications versus stents. 3. Bronchial thickening which may be chronic. Mild bibasilar atelectasis. 4. Scattered pulmonary cysts are nonspecific, likely post infectious or post inflammatory in etiology. Aortic Atherosclerosis (ICD10-I70.0).  Electronically Signed   By: Keith Rake M.D.   On: 02/01/2018 07:13    EKG: Independently reviewed. Sinus, nonspecific T wave changes    Time spent:60 minutes Code Status:   FULL Family Communication:  No Family at bedside Disposition Plan: expect 1-2 day hospitalization pending cardiology eval Consults called: cardiology DVT Prophylaxis: [place] Lovenox  Orson Eva, DO  [facility] Pager [phone]  If 7PM-7AM, please contact night-coverage [email or web address] Password [facility] 02/01/2018, 7:45 AM

## 2018-02-01 NOTE — Progress Notes (Signed)
Patient admitted from ED to room 309, report was given by Eston Esters RN. Vital signs stable. Patient placed on Telemetry box 12. Family at the bedside. Will continue to monitor patient.

## 2018-02-01 NOTE — ED Notes (Signed)
Pt has experienced chest pain x2 days, went to PCP yesterday w/ EKG/labs and everything was normal, c/o left arm being numb

## 2018-02-01 NOTE — ED Provider Notes (Signed)
[facility] EMERGENCY DEPARTMENT Provider Note   CSN: [id number] Arrival date & time:        History   Chief Complaint Chief Complaint  Patient presents with  . Chest Pain    HPI [name] is a 62 y.o. male.  This patient is a 62 year old male with past medical history of hypertension, past colon cancer, and tobacco abuse.  He presents for evaluation of shortness of breath.  This began approximately 1 hour prior to arrival while he was sleeping.  He woke up wheezing and having difficulty breathing.  He also reports pressure to the left side of his chest.  He denies any fevers, chills, or cough.  He denies any history of any pulmonary disease.  The history is provided by the patient.  Chest Pain   This is a new problem. The current episode started 1 to 2 hours ago. The problem occurs constantly. The problem has not changed since onset.The pain is present in the lateral region. The pain is moderate. The quality of the pain is described as pressure-like. The pain does not radiate. The symptoms are aggravated by deep breathing and certain positions. Associated symptoms include shortness of breath. Pertinent negatives include no cough, no diaphoresis, no fever, no nausea and no sputum production. He has tried nothing for the symptoms. Risk factors include smoking/tobacco exposure.    Past Medical History:  Diagnosis Date  . Colon cancer DEC 2014  . Hypertension   . Renal disorder    Kidney stones    There are no active problems to display for this patient.   Past Surgical History:  Procedure Laterality Date  . COLONOSCOPY N/A 04/29/2013   Procedure: COLONOSCOPY;  Surgeon: Danie Binder, MD;  Location: [facility];  Service: Endoscopy;  Laterality: N/A;  10:30AM  . Cyst removed from left elbow    . KIDNEY STONE SURGERY    . LITHOTRIPSY    . PARTIAL COLECTOMY          Home Medications    Prior to Admission medications   Medication Sig Start Date End Date  Taking? Authorizing Provider  lisinopril (PRINIVIL,ZESTRIL) 10 MG tablet Take 10 mg by mouth daily.    [provider]  Multiple Vitamin (MULTIVITAMIN) tablet Take 1 tablet by mouth daily.    [provider]    Family History Family History  Problem Relation Age of Onset  . Colon cancer Neg Hx     Social History Social History   Tobacco Use  . Smoking status: Current Every Day Smoker    Packs/day: 1.00    Years: 20.00    Pack years: 20.00    Types: Cigarettes  Substance Use Topics  . Alcohol use: Yes    Alcohol/week: 2.0 standard drinks    Types: 2 Cans of beer per week  . Drug use: No     Allergies   Penicillins   Review of Systems Review of Systems  Constitutional: Negative for diaphoresis and fever.  Respiratory: Positive for shortness of breath. Negative for cough and sputum production.   Cardiovascular: Positive for chest pain.  Gastrointestinal: Negative for nausea.  All other systems reviewed and are negative.    Physical Exam Updated Vital Signs There were no vitals taken for this visit.  Physical Exam  Constitutional: He is oriented to person, place, and time. He appears well-developed and well-nourished. No distress.  HENT:  Head: Normocephalic and atraumatic.  Mouth/Throat: Oropharynx is clear and moist.  Neck:  Normal range of motion. Neck supple.  Cardiovascular: Normal rate and regular rhythm. Exam reveals no friction rub.  No murmur heard. Pulmonary/Chest: Effort normal. No respiratory distress. He has wheezes in the right middle field, the right lower field, the left middle field and the left lower field. He has no rales.  Abdominal: Soft. Bowel sounds are normal. He exhibits no distension. There is no tenderness.  Musculoskeletal: Normal range of motion. He exhibits no edema.  Neurological: He is alert and oriented to person, place, and time. Coordination normal.  Skin: Skin is warm and dry. He is not diaphoretic.  Nursing  note and vitals reviewed.    ED Treatments / Results  Labs (all labs ordered are listed, but only abnormal results are displayed) Labs Reviewed - No data to display  ED ECG REPORT   Date: 02/01/2018  Rate: 88  Rhythm: normal sinus rhythm  QRS Axis: normal  Intervals: normal  ST/T Wave abnormalities: normal  Conduction Disutrbances:none  Narrative Interpretation:   Old EKG Reviewed: none available  I have personally reviewed the EKG tracing and agree with the computerized printout as noted.   Radiology Dg Chest 2 View  Result Date: 01/31/2018 CLINICAL DATA:  3-4 day history LEFT UPPER ANTERIOR chest pain. No known injury. Current smoker. EXAM: CHEST - 2 VIEW COMPARISON:  03/30/2013, 01/13/2011 and earlier. FINDINGS: Cardiac silhouette normal in size, unchanged. Thoracic aorta mildly atherosclerotic, unchanged. Hilar and mediastinal contours otherwise unremarkable. Chronic elevation of both hemidiaphragms with associated chronic scar/atelectasis involving the lower lobes. Prominent bronchovascular markings diffusely and moderate central peribronchial thickening, unchanged. Lungs otherwise clear. No localized airspace consolidation. No pleural effusions. No pneumothorax. Normal pulmonary vascularity. Remote healed LEFT rib fractures. Degenerative changes involving the thoracic and UPPER lumbar spine. IMPRESSION: 1.  No acute cardiopulmonary disease. 2. Stable changes of chronic bronchitis and/or asthma. Stable chronic scar/atelectasis involving the lower lobes related to chronic elevation of both hemidiaphragms. Electronically Signed   By: Evangeline Dakin M.D.   On: 01/31/2018 14:06    Procedures Procedures (including critical care time)  Medications Ordered in ED Medications - No data to display   Initial Impression / Assessment and Plan / ED Course  I have reviewed the triage vital signs and the nursing notes.  Pertinent labs & imaging results that were available during my  care of the patient were reviewed by me and considered in my medical decision making (see chart for details).  Patient presents with complaints of left-sided chest pain and shortness of breath that woke him from sleep.  His pain is somewhat pleuritic in nature and located to the left side of his chest.  His work-up reveals negative troponin.  PE study was performed and was negative for pulmonary embolism, but did show calcified coronary arteries.  For this reason, I feel the patient should be admitted for further evaluation.  I have spoken with Dr. Carles Collet who agrees to admit.  Final Clinical Impressions(s) / ED Diagnoses   Final diagnoses:  None    ED Discharge Orders    None       Veryl Speak, MD 02/02/18 [id number]

## 2018-02-02 ENCOUNTER — Encounter (HOSPITAL_COMMUNITY): Payer: Self-pay

## 2018-02-02 ENCOUNTER — Observation Stay (HOSPITAL_BASED_OUTPATIENT_CLINIC_OR_DEPARTMENT_OTHER): Payer: BLUE CROSS/BLUE SHIELD

## 2018-02-02 DIAGNOSIS — E782 Mixed hyperlipidemia: Secondary | ICD-10-CM

## 2018-02-02 DIAGNOSIS — Z716 Tobacco abuse counseling: Secondary | ICD-10-CM

## 2018-02-02 DIAGNOSIS — J438 Other emphysema: Secondary | ICD-10-CM | POA: Diagnosis not present

## 2018-02-02 DIAGNOSIS — R0789 Other chest pain: Secondary | ICD-10-CM

## 2018-02-02 DIAGNOSIS — J449 Chronic obstructive pulmonary disease, unspecified: Secondary | ICD-10-CM | POA: Diagnosis not present

## 2018-02-02 DIAGNOSIS — I2510 Atherosclerotic heart disease of native coronary artery without angina pectoris: Secondary | ICD-10-CM | POA: Diagnosis not present

## 2018-02-02 DIAGNOSIS — R079 Chest pain, unspecified: Secondary | ICD-10-CM | POA: Diagnosis not present

## 2018-02-02 DIAGNOSIS — I10 Essential (primary) hypertension: Secondary | ICD-10-CM

## 2018-02-02 DIAGNOSIS — Z720 Tobacco use: Secondary | ICD-10-CM | POA: Diagnosis not present

## 2018-02-02 LAB — BASIC METABOLIC PANEL
Anion gap: 8 (ref 5–15)
BUN: 17 mg/dL (ref 8–23)
CALCIUM: 9 mg/dL (ref 8.9–10.3)
CO2: 29 mmol/L (ref 22–32)
CREATININE: 0.92 mg/dL (ref 0.61–1.24)
Chloride: 104 mmol/L (ref 98–111)
Glucose, Bld: 97 mg/dL (ref 70–99)
Potassium: 4.3 mmol/L (ref 3.5–5.1)
Sodium: 141 mmol/L (ref 135–145)

## 2018-02-02 LAB — NM MYOCAR MULTI W/SPECT W/WALL MOTION / EF
CHL CUP MPHR: 157 {beats}/min
CHL CUP NUCLEAR SRS: 0
CHL CUP RESTING HR STRESS: 67 {beats}/min
CHL RATE OF PERCEIVED EXERTION: 15
CSEPEDS: 38 s
CSEPEW: 4.6 METS
CSEPHR: 79 %
Exercise duration (min): 2 min
LV sys vol: 31 mL
LVDIAVOL: 80 mL (ref 62–150)
Peak HR: 125 {beats}/min
RATE: 0.48
SDS: 4
SSS: 4
TID: 1

## 2018-02-02 LAB — MAGNESIUM: MAGNESIUM: 1.9 mg/dL (ref 1.7–2.4)

## 2018-02-02 LAB — HIV ANTIBODY (ROUTINE TESTING W REFLEX): HIV SCREEN 4TH GENERATION: NONREACTIVE

## 2018-02-02 MED ORDER — ATORVASTATIN CALCIUM 20 MG PO TABS: 20.0000 mg | ORAL_TABLET | Freq: Every day | ORAL | Status: DC

## 2018-02-02 MED ORDER — REGADENOSON 0.4 MG/5ML IV SOLN
INTRAVENOUS | Status: AC
  Administered 2018-02-02: 0.4 mg via INTRAVENOUS
  Filled 2018-02-02: qty 5

## 2018-02-02 MED ORDER — TECHNETIUM TC 99M TETROFOSMIN IV KIT
10.0000 | PACK | Freq: Once | INTRAVENOUS | Status: AC | PRN
  Administered 2018-02-02: 11 via INTRAVENOUS

## 2018-02-02 MED ORDER — SODIUM CHLORIDE 0.9% FLUSH
INTRAVENOUS | Status: AC
  Administered 2018-02-02: 10 mL via INTRAVENOUS
  Filled 2018-02-02: qty 10

## 2018-02-02 MED ORDER — ALBUTEROL SULFATE HFA 108 (90 BASE) MCG/ACT IN AERS: 2.0000 | INHALATION_SPRAY | Freq: Four times a day (QID) | RESPIRATORY_TRACT | 0 refills | Status: DC | PRN

## 2018-02-02 MED ORDER — ASPIRIN 81 MG PO TBEC: 81.0000 mg | DELAYED_RELEASE_TABLET | Freq: Every day | ORAL | Status: DC

## 2018-02-02 MED ORDER — TECHNETIUM TC 99M TETROFOSMIN IV KIT
30.0000 | PACK | Freq: Once | INTRAVENOUS | Status: AC | PRN
  Administered 2018-02-02: 33 via INTRAVENOUS

## 2018-02-02 NOTE — Progress Notes (Addendum)
Progress Note  Patient Name: [name] Date of Encounter: 02/02/2018  Primary Cardiologist: Carlyle Dolly, MD   Subjective   Reports having his "usual pain" overnight and this morning. Has been experiencing intermittent episodes of pain along his entire precordium which typically lasts for seconds at a time. Has been NPO since midnight. Evaluated in Nuclear Medicine at the time of his stress test.   Inpatient Medications    Scheduled Meds: . aspirin EC  81 mg Oral Daily  . budesonide (PULMICORT) nebulizer solution  0.5 mg Nebulization BID  . enoxaparin (LOVENOX) injection  40 mg Subcutaneous Q24H  . ipratropium-albuterol  3 mL Nebulization BID  . lisinopril  10 mg Oral Daily   Continuous Infusions:  PRN Meds: acetaminophen **OR** acetaminophen, nitroGLYCERIN, ondansetron **OR** ondansetron (ZOFRAN) IV, technetium tetrofosmin   Vital Signs    Vitals:   02/01/18 1647 02/01/18 1948 02/01/18 2145 02/02/18 0605  BP:   (!) 141/95 (!) 148/99  Pulse:   72 70  Resp:      Temp:   98.1 F (36.7 C) 97.7 F (36.5 C)  TempSrc:   Oral Oral  SpO2:  94% 94% 96%  Weight: 114.8 kg     Height: 6\' 3"  (1.905 m)       Intake/Output Summary (Last 24 hours) at 02/02/2018 0837 Last data filed at 02/01/2018 1700 Gross per 24 hour  Intake 720 ml  Output -  Net 720 ml   Filed Weights   02/01/18 1647  Weight: 114.8 kg    Telemetry    NSR, HR in 60's to 70's with occasional PVC's.  - Personally Reviewed  ECG    NSR, HR 88, with PAC's. No acute ST abnormalities.  - Personally Reviewed  Physical Exam   General: Well developed, well nourished Caucasian male appearing in no acute distress. Head: Normocephalic, atraumatic.  Neck: Supple without bruits, JVD not elevated. Lungs:  Resp regular and unlabored, occasional rhonchi. No wheezing or rales appreciated.  Heart: RRR with occasional ectopic beats, S1, S2, no S3, S4, or murmur; no rub. Abdomen: Soft, non-tender,  non-distended with normoactive bowel sounds. No hepatomegaly. No rebound/guarding. No obvious abdominal masses. Extremities: No clubbing, cyanosis, or lower extremity edema. Distal pedal pulses are 2+ bilaterally. Neuro: Alert and oriented X 3. Moves all extremities spontaneously. Psych: Normal affect.  Labs    Chemistry Recent Labs  Lab 02/01/18 0309 02/02/18 0507  NA 141 141  K 3.8 4.3  CL 104 104  CO2 27 29  GLUCOSE 96 97  BUN 21 17  CREATININE 1.11 0.92  CALCIUM 9.3 9.0  PROT 7.5  --   ALBUMIN 4.2  --   AST 25  --   ALT 36  --   ALKPHOS 66  --   BILITOT 0.5  --   GFRNONAA >60 >60  GFRAA >60 >60  ANIONGAP 10 8     Hematology Recent Labs  Lab 02/01/18 0309  WBC 6.1  RBC 4.64  HGB 15.9  HCT 47.0  MCV 101.3*  MCH 34.3*  MCHC 33.8  RDW 12.6  PLT 162    Cardiac Enzymes Recent Labs  Lab 02/01/18 0953 02/01/18 1544 02/01/18 2139  TROPONINI <0.03 <0.03 <0.03    Recent Labs  Lab 02/01/18 0315  TROPIPOC 0.00     BNP Recent Labs  Lab 02/01/18 0309  BNP 48.0     DDimer No results for input(s): DDIMER in the last 168 hours.   Radiology  Dg Chest 2 View  Result Date: 01/31/2018 CLINICAL DATA:  3-4 day history LEFT UPPER ANTERIOR chest pain. No known injury. Current smoker. EXAM: CHEST - 2 VIEW COMPARISON:  03/30/2013, 01/13/2011 and earlier. FINDINGS: Cardiac silhouette normal in size, unchanged. Thoracic aorta mildly atherosclerotic, unchanged. Hilar and mediastinal contours otherwise unremarkable. Chronic elevation of both hemidiaphragms with associated chronic scar/atelectasis involving the lower lobes. Prominent bronchovascular markings diffusely and moderate central peribronchial thickening, unchanged. Lungs otherwise clear. No localized airspace consolidation. No pleural effusions. No pneumothorax. Normal pulmonary vascularity. Remote healed LEFT rib fractures. Degenerative changes involving the thoracic and UPPER lumbar spine. IMPRESSION: 1.  No  acute cardiopulmonary disease. 2. Stable changes of chronic bronchitis and/or asthma. Stable chronic scar/atelectasis involving the lower lobes related to chronic elevation of both hemidiaphragms. Electronically Signed   By: Evangeline Dakin M.D.   On: 01/31/2018 14:06   Ct Angio Chest Pe W Or Wo Contrast  Result Date: 02/01/2018 CLINICAL DATA:  Chest pain and shortness of breath. EXAM: CT ANGIOGRAPHY CHEST WITH CONTRAST TECHNIQUE: Multidetector CT imaging of the chest was performed using the standard protocol during bolus administration of intravenous contrast. Multiplanar CT image reconstructions and MIPs were obtained to evaluate the vascular anatomy. CONTRAST:  151mL ISOVUE-370 IOPAMIDOL (ISOVUE-370) INJECTION 76% COMPARISON:  Radiographs yesterday. FINDINGS: Cardiovascular: There are no filling defects within the pulmonary arteries to suggest pulmonary embolus. Tortuous atherosclerotic thoracic aorta. No aortic dissection. Coronary artery calcifications versus stents. Heart is normal in size. No pericardial effusion. Mediastinum/Nodes: No enlarged mediastinal or hilar lymph nodes. Esophagus is nondistended. Thyroid gland is normal. Lungs/Pleura: Mild to moderate bronchial thickening, of uncertain acuity. Mild bibasilar atelectasis. Scattered pulmonary cysts. No confluent airspace disease, pulmonary edema or pleural effusion. Upper Abdomen: No acute findings. Musculoskeletal: Degenerative change in the spine. There are no acute or suspicious osseous abnormalities. Review of the MIP images confirms the above findings. IMPRESSION: 1. No pulmonary embolus. 2. Coronary artery calcifications versus stents. 3. Bronchial thickening which may be chronic. Mild bibasilar atelectasis. 4. Scattered pulmonary cysts are nonspecific, likely post infectious or post inflammatory in etiology. Aortic Atherosclerosis (ICD10-I70.0). Electronically Signed   By: Keith Rake M.D.   On: 02/01/2018 07:13    Cardiac Studies    Echocardiogram: 02/01/2018 Study Conclusions  - Left ventricle: The cavity size was normal. Wall thickness was   increased in a pattern of mild LVH. Systolic function was normal.   The estimated ejection fraction was in the range of 60% to 65%.   Wall motion was normal; there were no regional wall motion   abnormalities. Left ventricular diastolic function parameters   were normal. - Aortic valve: Mildly calcified annulus. Trileaflet; mildly   thickened leaflets. Valve area (VTI): 2.62 cm^2. Valve area   (Vmax): 1.99 cm^2. - Mitral valve: Mildly calcified annulus. Normal thickness leaflets   . - Technically difficult study. Echocontrast was used to enhance   visualization.   Patient Profile     62 y.o. male w/ PMH of COPD, HTN, HLD, tobacco use, and history of colon cancer who presented to [facility] ED on 02/01/2018 for evaluation of chest pain and worsening dyspnea.   Assessment & Plan    1. Chest Pain with Mixed Typical and Atypical Features/ Coronary Calcifications by CT - the patient reports having episodes of chest pain over the past 3-4 weeks which can occur at rest or with activity and is typically worse with positional changes or deep breathing. His pain usually lasts a few seconds but  can persist for hours.  - cyclic troponin values have been negative and his EKG shows no acute ischemic changes. Echocardiogram shows a preserved EF of 60-65% with no regional WMA. Did have coronary calcifications by CT Imaging. Negative for PE.  - his chest pain is overall atypical for a cardiac etiology but given his risk factors and coronary calcification by CT, an Exercise Myoview was ordered for this morning. The patient was unable to achieve Target HR while on the treadmill, therefore this was converted to a Lexiscan. He tolerated the stress test well. Official report pending following stress images.   2. HTN - BP has been at 119/78 - 148/99 since admission.  - continue PTA Lisinopril.  Creatinine stable at 0.92 this AM.   3. HLD - FLP shows total cholesterol of 125, HDL 47, and LDL 47. Will re-order PTA Atorvastatin 20mg  daily.   4. COPD/ Tobacco Use - currently on Pulmicort and receiving scheduled Duoneb treatments. He does smoke 1 ppd and cessation was advised.    For questions or updates, please contact [place] Please consult [email or web address] for contact info under Cardiology/STEMI.   Signed, Erma Heritage , PA-C 8:37 AM 02/02/2018 Pager: [phone]  The patient was seen and examined, and I agree with the history, physical exam, assessment and plan as documented above, with modifications as noted below.  The patient is doing well this morning.  I reviewed the nuclear stress test which portends a low risk for future cardiac events.  It was no evidence of myocardial ischemia or scar.  I think he can be discharged with outpatient follow-up with his PCP and in our office.  He needs tobacco cessation.  We will arrange for follow-up in our office.  Kate Sable, MD, [facility]  02/02/2018 11:10 AM

## 2018-02-02 NOTE — Discharge Summary (Signed)
Physician Discharge Summary  [name] RCV:[id number] DOB: Apr 02, 1956 DOA: 02/01/2018  PCP: [name], MD  Admit date: 02/01/2018 Discharge date: 02/02/2018  Admitted From: Home Disposition:  Home   Recommendations for Outpatient Follow-up:  1. Follow up with PCP in 1-2 weeks 2. Please obtain BMP/CBC in one week     Discharge Condition: Stable CODE STATUS: FULL Diet recommendation: Heart Healthy    Brief/Interim Summary:  62 y.o. male with medical history of colon cancer, hypertension, tobacco abuse, COPD presenting with 1 monthhistory of intermittent chest discomfort and shortness of breath.  The patient went to see his primary care provider on 01/31/2018.  He stated that blood work and chest x-ray were obtained at that time and were unremarkable.  He was not started on any new medications.  Upon further questioning, the patient states that he has had intermittent chest discomfort for the better part of 1 month with decreased activity tolerance.  In addition, he has had intermittent left arm numbness during this period of time.  He describes his chest discomfort as an "aching sensation".  He has a chronic nonproductive cough.  Denies any fevers, chills, nausea, vomiting, diarrhea, diaphoresis.  He continues to smoke 1 pack/day.  He has a nearly 80-pack-year history.  On early morning of 02/01/2018, the patient woke up with worsening chest discomfort and shortness of breath.  As result, he came to the emergency department for further evaluation.  Discharge Diagnoses:  Chest pain -Cardiology consult-->stress test-->low risk, 63 % -Cycle troponins--neg x 3 -Echocardiogram--EF 60-65%, no WMA -EKG--sinus rhythm, nonspecific T wave change -02/01/2018 CTA chest--negative for PE, but showed coronary calcifications -Continue aspirin  Tobacco abuse/COPD -started duonebs -started pulmicort -d/c home with albuterol MDI -will need outpt PFTs -stable on RA -I have discussed  tobacco cessation with the patient.  I have counseled the patient regarding the negative impacts of continued tobacco use including but not limited to lung cancer, COPD, and cardiovascular disease.  I have discussed alternatives to tobacco and modalities that may help facilitate tobacco cessation including but not limited to biofeedback, hypnosis, and medications.  Total time spent with tobacco counseling was 4 minutes.  Essential hypertension -Continue lisinopril  Hyperlipidemia -Restart atorvastatin    Discharge Instructions   Allergies as of 02/02/2018      Reactions   Penicillins Other (See Comments)   Childhood Allergy  Has patient had a PCN reaction causing immediate rash, facial/tongue/throat swelling, SOB or lightheadedness with hypotension: No Has patient had a PCN reaction causing severe rash involving mucus membranes or skin necrosis: No Has patient had a PCN reaction that required hospitalization: No Has patient had a PCN reaction occurring within the last 10 years: No If all of the above answers are "NO", then may proceed with Cephalosporin use.      Medication List    TAKE these medications   albuterol 108 (90 Base) MCG/ACT inhaler Commonly known as:  PROVENTIL HFA;VENTOLIN HFA Inhale 2 puffs into the lungs every 6 (six) hours as needed for wheezing or shortness of breath.   aspirin 81 MG EC tablet Take 1 tablet (81 mg total) by mouth daily. Start taking on:  02/03/2018   atorvastatin 20 MG tablet Commonly known as:  LIPITOR Take 1 tablet by mouth daily.   furosemide 20 MG tablet Commonly known as:  LASIX Take 1 tablet by mouth 2 (two) times daily as needed.   lisinopril 20 MG tablet Commonly known as:  PRINIVIL,ZESTRIL Take 1 tablet by mouth daily.  Follow-up Information    Arnoldo Lenis, MD Follow up on 03/02/2018.   Specialty:  Cardiology Why:  [facility]iology [facility] Follow-Up on 03/02/2018 at 2:30 with Rosaria Ferries, PA-C (works with Dr.  Harl Bowie).  Contact information: [address] [place] [address] [phone]          Allergies  Allergen Reactions  . Penicillins Other (See Comments)    Childhood Allergy  Has patient had a PCN reaction causing immediate rash, facial/tongue/throat swelling, SOB or lightheadedness with hypotension: No Has patient had a PCN reaction causing severe rash involving mucus membranes or skin necrosis: No Has patient had a PCN reaction that required hospitalization: No Has patient had a PCN reaction occurring within the last 10 years: No If all of the above answers are "NO", then may proceed with Cephalosporin use.     Consultations:  cardiology   Procedures/Studies: Dg Chest 2 View  Result Date: 01/31/2018 CLINICAL DATA:  3-4 day history LEFT UPPER ANTERIOR chest pain. No known injury. Current smoker. EXAM: CHEST - 2 VIEW COMPARISON:  03/30/2013, 01/13/2011 and earlier. FINDINGS: Cardiac silhouette normal in size, unchanged. Thoracic aorta mildly atherosclerotic, unchanged. Hilar and mediastinal contours otherwise unremarkable. Chronic elevation of both hemidiaphragms with associated chronic scar/atelectasis involving the lower lobes. Prominent bronchovascular markings diffusely and moderate central peribronchial thickening, unchanged. Lungs otherwise clear. No localized airspace consolidation. No pleural effusions. No pneumothorax. Normal pulmonary vascularity. Remote healed LEFT rib fractures. Degenerative changes involving the thoracic and UPPER lumbar spine. IMPRESSION: 1.  No acute cardiopulmonary disease. 2. Stable changes of chronic bronchitis and/or asthma. Stable chronic scar/atelectasis involving the lower lobes related to chronic elevation of both hemidiaphragms. Electronically Signed   By: Evangeline Dakin M.D.   On: 01/31/2018 14:06   Ct Angio Chest Pe W Or Wo Contrast  Result Date: 02/01/2018 CLINICAL DATA:  Chest pain and shortness of breath. EXAM: CT ANGIOGRAPHY  CHEST WITH CONTRAST TECHNIQUE: Multidetector CT imaging of the chest was performed using the standard protocol during bolus administration of intravenous contrast. Multiplanar CT image reconstructions and MIPs were obtained to evaluate the vascular anatomy. CONTRAST:  159mL ISOVUE-370 IOPAMIDOL (ISOVUE-370) INJECTION 76% COMPARISON:  Radiographs yesterday. FINDINGS: Cardiovascular: There are no filling defects within the pulmonary arteries to suggest pulmonary embolus. Tortuous atherosclerotic thoracic aorta. No aortic dissection. Coronary artery calcifications versus stents. Heart is normal in size. No pericardial effusion. Mediastinum/Nodes: No enlarged mediastinal or hilar lymph nodes. Esophagus is nondistended. Thyroid gland is normal. Lungs/Pleura: Mild to moderate bronchial thickening, of uncertain acuity. Mild bibasilar atelectasis. Scattered pulmonary cysts. No confluent airspace disease, pulmonary edema or pleural effusion. Upper Abdomen: No acute findings. Musculoskeletal: Degenerative change in the spine. There are no acute or suspicious osseous abnormalities. Review of the MIP images confirms the above findings. IMPRESSION: 1. No pulmonary embolus. 2. Coronary artery calcifications versus stents. 3. Bronchial thickening which may be chronic. Mild bibasilar atelectasis. 4. Scattered pulmonary cysts are nonspecific, likely post infectious or post inflammatory in etiology. Aortic Atherosclerosis (ICD10-I70.0). Electronically Signed   By: Keith Rake M.D.   On: 02/01/2018 07:13   Nm Myocar Multi W/spect W/wall Motion / Ef  Result Date: 02/02/2018  Blood pressure demonstrated a hypertensive response to exercise.  There was no ST segment deviation noted during stress.  Defect 1: There is a medium defect of moderate severity present in the mid inferoseptal, mid inferior and apical inferior location. As regional wall motion appears grossly normal, this appears to be due to soft tissue  attenuation/artifact.  This is a low risk study. No ischemia or scar.  Nuclear stress EF: 61%.          Discharge Exam: Vitals:   02/02/18 1004 02/02/18 1010  BP:    Pulse:    Resp:    Temp:    SpO2: 96% 96%   Vitals:   02/01/18 2145 02/02/18 0605 02/02/18 1004 02/02/18 1010  BP: (!) 141/95 (!) 148/99    Pulse: 72 70    Resp:      Temp: 98.1 F (36.7 C) 97.7 F (36.5 C)    TempSrc: Oral Oral    SpO2: 94% 96% 96% 96%  Weight:      Height:        General: Pt is alert, awake, not in acute distress Cardiovascular: RRR, S1/S2 +, no rubs, no gallops Respiratory: bibasilar rales, no wheeze Abdominal: Soft, NT, ND, bowel sounds + Extremities: no edema, no cyanosis   The results of significant diagnostics from this hospitalization (including imaging, microbiology, ancillary and laboratory) are listed below for reference.    Significant Diagnostic Studies: Dg Chest 2 View  Result Date: 01/31/2018 CLINICAL DATA:  3-4 day history LEFT UPPER ANTERIOR chest pain. No known injury. Current smoker. EXAM: CHEST - 2 VIEW COMPARISON:  03/30/2013, 01/13/2011 and earlier. FINDINGS: Cardiac silhouette normal in size, unchanged. Thoracic aorta mildly atherosclerotic, unchanged. Hilar and mediastinal contours otherwise unremarkable. Chronic elevation of both hemidiaphragms with associated chronic scar/atelectasis involving the lower lobes. Prominent bronchovascular markings diffusely and moderate central peribronchial thickening, unchanged. Lungs otherwise clear. No localized airspace consolidation. No pleural effusions. No pneumothorax. Normal pulmonary vascularity. Remote healed LEFT rib fractures. Degenerative changes involving the thoracic and UPPER lumbar spine. IMPRESSION: 1.  No acute cardiopulmonary disease. 2. Stable changes of chronic bronchitis and/or asthma. Stable chronic scar/atelectasis involving the lower lobes related to chronic elevation of both hemidiaphragms. Electronically  Signed   By: Evangeline Dakin M.D.   On: 01/31/2018 14:06   Ct Angio Chest Pe W Or Wo Contrast  Result Date: 02/01/2018 CLINICAL DATA:  Chest pain and shortness of breath. EXAM: CT ANGIOGRAPHY CHEST WITH CONTRAST TECHNIQUE: Multidetector CT imaging of the chest was performed using the standard protocol during bolus administration of intravenous contrast. Multiplanar CT image reconstructions and MIPs were obtained to evaluate the vascular anatomy. CONTRAST:  134mL ISOVUE-370 IOPAMIDOL (ISOVUE-370) INJECTION 76% COMPARISON:  Radiographs yesterday. FINDINGS: Cardiovascular: There are no filling defects within the pulmonary arteries to suggest pulmonary embolus. Tortuous atherosclerotic thoracic aorta. No aortic dissection. Coronary artery calcifications versus stents. Heart is normal in size. No pericardial effusion. Mediastinum/Nodes: No enlarged mediastinal or hilar lymph nodes. Esophagus is nondistended. Thyroid gland is normal. Lungs/Pleura: Mild to moderate bronchial thickening, of uncertain acuity. Mild bibasilar atelectasis. Scattered pulmonary cysts. No confluent airspace disease, pulmonary edema or pleural effusion. Upper Abdomen: No acute findings. Musculoskeletal: Degenerative change in the spine. There are no acute or suspicious osseous abnormalities. Review of the MIP images confirms the above findings. IMPRESSION: 1. No pulmonary embolus. 2. Coronary artery calcifications versus stents. 3. Bronchial thickening which may be chronic. Mild bibasilar atelectasis. 4. Scattered pulmonary cysts are nonspecific, likely post infectious or post inflammatory in etiology. Aortic Atherosclerosis (ICD10-I70.0). Electronically Signed   By: Keith Rake M.D.   On: 02/01/2018 07:13   Nm Myocar Multi W/spect W/wall Motion / Ef  Result Date: 02/02/2018  Blood pressure demonstrated a hypertensive response to exercise.  There was no ST segment deviation noted during stress.  Defect 1: There is  a medium defect  of moderate severity present in the mid inferoseptal, mid inferior and apical inferior location. As regional wall motion appears grossly normal, this appears to be due to soft tissue attenuation/artifact.  This is a low risk study. No ischemia or scar.  Nuclear stress EF: 61%.      Microbiology: No results found for this or any previous visit (from the past 240 hour(s)).   Labs: Basic Metabolic Panel: Recent Labs  Lab 02/01/18 0309 02/02/18 0507  NA 141 141  K 3.8 4.3  CL 104 104  CO2 27 29  GLUCOSE 96 97  BUN 21 17  CREATININE 1.11 0.92  CALCIUM 9.3 9.0  MG  --  1.9   Liver Function Tests: Recent Labs  Lab 02/01/18 0309  AST 25  ALT 36  ALKPHOS 66  BILITOT 0.5  PROT 7.5  ALBUMIN 4.2   No results for input(s): LIPASE, AMYLASE in the last 168 hours. No results for input(s): AMMONIA in the last 168 hours. CBC: Recent Labs  Lab 02/01/18 0309  WBC 6.1  NEUTROABS 2.6  HGB 15.9  HCT 47.0  MCV 101.3*  PLT 162   Cardiac Enzymes: Recent Labs  Lab 02/01/18 0953 02/01/18 1544 02/01/18 2139  TROPONINI <0.03 <0.03 <0.03   BNP: Invalid input(s): POCBNP CBG: Recent Labs  Lab 02/01/18 2142  GLUCAP 121*    Time coordinating discharge:  36 minutes  Signed:  Orson Eva, DO [facility] Pager: [phone] 02/02/2018, 1:23 PM

## 2018-02-02 NOTE — Progress Notes (Signed)
IV discontinued,catheter intact. Discharge instructions given on medications and follow up visits,patient verbalized understanding. Prescriptions sent to Pharmacy of choice documented on AVS. Accompanied by staff to an awaiting vehicle.

## 2018-02-06 DIAGNOSIS — Z719 Counseling, unspecified: Secondary | ICD-10-CM | POA: Diagnosis not present

## 2018-02-06 DIAGNOSIS — I10 Essential (primary) hypertension: Secondary | ICD-10-CM | POA: Diagnosis not present

## 2018-02-06 DIAGNOSIS — E6609 Other obesity due to excess calories: Secondary | ICD-10-CM | POA: Diagnosis not present

## 2018-02-06 DIAGNOSIS — E782 Mixed hyperlipidemia: Secondary | ICD-10-CM | POA: Diagnosis not present

## 2018-02-06 DIAGNOSIS — R079 Chest pain, unspecified: Secondary | ICD-10-CM | POA: Diagnosis not present

## 2018-02-06 DIAGNOSIS — F17200 Nicotine dependence, unspecified, uncomplicated: Secondary | ICD-10-CM | POA: Diagnosis not present

## 2018-02-06 DIAGNOSIS — Z1389 Encounter for screening for other disorder: Secondary | ICD-10-CM | POA: Diagnosis not present

## 2018-02-06 DIAGNOSIS — Z6833 Body mass index (BMI) 33.0-33.9, adult: Secondary | ICD-10-CM | POA: Diagnosis not present

## 2018-03-02 ENCOUNTER — Ambulatory Visit: Payer: BLUE CROSS/BLUE SHIELD | Admitting: Physician Assistant

## 2018-03-12 DIAGNOSIS — Z23 Encounter for immunization: Secondary | ICD-10-CM | POA: Diagnosis not present

## 2018-05-28 DIAGNOSIS — R609 Edema, unspecified: Secondary | ICD-10-CM | POA: Diagnosis not present

## 2018-05-28 DIAGNOSIS — I739 Peripheral vascular disease, unspecified: Secondary | ICD-10-CM | POA: Diagnosis not present

## 2018-06-04 ENCOUNTER — Other Ambulatory Visit: Payer: Self-pay | Admitting: Podiatry

## 2018-06-04 DIAGNOSIS — I872 Venous insufficiency (chronic) (peripheral): Secondary | ICD-10-CM

## 2018-06-07 ENCOUNTER — Ambulatory Visit (HOSPITAL_COMMUNITY): Payer: BLUE CROSS/BLUE SHIELD

## 2018-06-12 ENCOUNTER — Other Ambulatory Visit (HOSPITAL_COMMUNITY): Payer: Self-pay | Admitting: Podiatry

## 2018-06-12 DIAGNOSIS — I872 Venous insufficiency (chronic) (peripheral): Secondary | ICD-10-CM

## 2018-06-13 ENCOUNTER — Ambulatory Visit (HOSPITAL_COMMUNITY): Admission: RE | Admit: 2018-06-13 | Payer: BLUE CROSS/BLUE SHIELD | Source: Ambulatory Visit

## 2018-06-13 ENCOUNTER — Ambulatory Visit (HOSPITAL_COMMUNITY)
Admission: RE | Admit: 2018-06-13 | Discharge: 2018-06-13 | Disposition: A | Discharge status: Home / Self Care | Payer: BLUE CROSS/BLUE SHIELD | Source: Ambulatory Visit | Attending: Family | Admitting: Family

## 2018-06-13 ENCOUNTER — Other Ambulatory Visit (HOSPITAL_COMMUNITY): Payer: Self-pay | Admitting: Podiatry

## 2018-06-13 ENCOUNTER — Encounter (HOSPITAL_COMMUNITY): Payer: Self-pay

## 2018-06-13 DIAGNOSIS — I872 Venous insufficiency (chronic) (peripheral): Secondary | ICD-10-CM

## 2018-06-14 ENCOUNTER — Ambulatory Visit (HOSPITAL_COMMUNITY)
Admission: RE | Admit: 2018-06-14 | Discharge: 2018-06-14 | Disposition: A | Discharge status: Home / Self Care | Payer: BLUE CROSS/BLUE SHIELD | Source: Ambulatory Visit | Attending: Family | Admitting: Family

## 2018-06-14 DIAGNOSIS — I872 Venous insufficiency (chronic) (peripheral): Secondary | ICD-10-CM | POA: Insufficient documentation

## 2018-06-18 DIAGNOSIS — L600 Ingrowing nail: Secondary | ICD-10-CM | POA: Diagnosis not present

## 2018-06-18 DIAGNOSIS — I872 Venous insufficiency (chronic) (peripheral): Secondary | ICD-10-CM | POA: Diagnosis not present

## 2018-06-18 DIAGNOSIS — R609 Edema, unspecified: Secondary | ICD-10-CM | POA: Diagnosis not present

## 2018-06-25 ENCOUNTER — Ambulatory Visit (INDEPENDENT_AMBULATORY_CARE_PROVIDER_SITE_OTHER): Payer: BLUE CROSS/BLUE SHIELD | Admitting: Vascular Surgery

## 2018-06-25 ENCOUNTER — Encounter: Payer: Self-pay | Admitting: Vascular Surgery

## 2018-06-25 VITALS — BP 130/73 | HR 93 | Temp 98.0°F | Resp 22 | Ht 73.0 in | Wt 264.3 lb

## 2018-06-25 DIAGNOSIS — I8312 Varicose veins of left lower extremity with inflammation: Secondary | ICD-10-CM

## 2018-06-25 NOTE — Progress Notes (Signed)
Vascular and Vein Specialist of [facility] office  Patient name: [name] MRN: [id number] DOB: [birth year] Sex: male  REASON FOR CONSULT: Evaluation of swelling irritation, inflammation of left lower extremity related to venous hypertension  HPI: [name] is a 63 y.o. male, who is here today for evaluation.  He is here with his wife.  He has had progressive swelling in his left lower extremity.  No swelling in the right.  This is been present for a number of years and is now more persistent when he does not have resolution in the morning.  He reports thickening of the skin around his left distal calf.  He also has difficulty with skin changes with scaling and dry skin.  He has not had any bleeding from his petechia around his ankles.  Past Medical History:  Diagnosis Date  . Colon cancer ([place]) [place] 2014  . Hypertension   . Renal disorder    Kidney stones    Family History  Problem Relation Age of Onset  . Colon cancer Neg Hx     SOCIAL HISTORY: Social History   Socioeconomic History  . Marital status: Married    Spouse name: Not on file  . Number of children: Not on file  . Years of education: Not on file  . Highest education level: Not on file  Occupational History  . Not on file  Social Needs  . Financial resource strain: Not on file  . Food insecurity:    Worry: Not on file    Inability: Not on file  . Transportation needs:    Medical: Not on file    Non-medical: Not on file  Tobacco Use  . Smoking status: Current Every Day Smoker    Packs/day: 1.00    Years: 20.00    Pack years: 20.00    Types: Cigarettes  . Smokeless tobacco: Never Used  Substance and Sexual Activity  . Alcohol use: Yes    Alcohol/week: 2.0 standard drinks    Types: 2 Cans of beer per week  . Drug use: No  . Sexual activity: Not on file  Lifestyle  . Physical activity:    Days per week: Not on file    Minutes per session:  Not on file  . Stress: Not on file  Relationships  . Social connections:    Talks on phone: Not on file    Gets together: Not on file    Attends religious service: Not on file    Active member of club or organization: Not on file    Attends meetings of clubs or organizations: Not on file    Relationship status: Not on file  . Intimate partner violence:    Fear of current or ex partner: Not on file    Emotionally abused: Not on file    Physically abused: Not on file    Forced sexual activity: Not on file  Other Topics Concern  . Not on file  Social History Narrative  . Not on file    Allergies  Allergen Reactions  . Penicillins Other (See Comments)    Childhood Allergy  Has patient had a PCN reaction causing immediate rash, facial/tongue/throat swelling, SOB or lightheadedness with hypotension: No Has patient had a PCN reaction causing severe rash involving mucus membranes or skin necrosis: No Has patient had a PCN reaction that required hospitalization: No Has patient had a PCN reaction occurring within the last 10 years: No If all of  the above answers are "NO", then may proceed with Cephalosporin use.     Current Outpatient Medications  Medication Sig Dispense Refill  . Multiple Vitamin (MULTI-VITAMIN DAILY PO) Take by mouth.    Marland Kitchen albuterol (PROVENTIL HFA;VENTOLIN HFA) 108 (90 Base) MCG/ACT inhaler Inhale 2 puffs into the lungs every 6 (six) hours as needed for wheezing or shortness of breath. 1 Inhaler 0  . aspirin EC 81 MG EC tablet Take 1 tablet (81 mg total) by mouth daily.    Marland Kitchen atorvastatin (LIPITOR) 20 MG tablet Take 1 tablet by mouth daily.  3  . furosemide (LASIX) 20 MG tablet Take 1 tablet by mouth 2 (two) times daily as needed.  2  . lisinopril (PRINIVIL,ZESTRIL) 20 MG tablet Take 1 tablet by mouth daily.  1   No current facility-administered medications for this visit.     REVIEW OF SYSTEMS:  [X]  denotes positive finding, [ ]  denotes negative finding Cardiac   Comments:  Chest pain or chest pressure:    Shortness of breath upon exertion:    Short of breath when lying flat:    Irregular heart rhythm:        Vascular    Pain in calf, thigh, or hip brought on by ambulation:    Pain in feet at night that wakes you up from your sleep:     Blood clot in your veins:    Leg swelling:  x       Pulmonary    Oxygen at home:    Productive cough:     Wheezing:         Neurologic    Sudden weakness in arms or legs:     Sudden numbness in arms or legs:     Sudden onset of difficulty speaking or slurred speech:    Temporary loss of vision in one eye:     Problems with dizziness:         Gastrointestinal    Blood in stool:     Vomited blood:         Genitourinary    Burning when urinating:     Blood in urine:        Psychiatric    Major depression:         Hematologic    Bleeding problems:    Problems with blood clotting too easily:        Skin    Rashes or ulcers:        Constitutional    Fever or chills:      PHYSICAL EXAM: Vitals:   06/25/18 1048  BP: 130/73  Pulse: 93  Resp: (!) 22  Temp: 98 F (36.7 C)  TempSrc: Temporal  Weight: 264 lb 4.8 oz (119.9 kg)  Height: 6\' 1"  (1.854 m)    GENERAL: The patient is a well-nourished male, in no acute distress. The vital signs are documented above. CARDIOVASCULAR: Full radial and palpable dorsalis pedis pulses PULMONARY: There is good air exchange  ABDOMEN: Soft and non-tender  MUSCULOSKELETAL: There are no major deformities or cyanosis. NEUROLOGIC: No focal weakness or paresthesias are detected. SKIN: Circumferential thickening above his ankle on the left with some hemosiderin deposit.  The skin is very dry.  He also has extensive telangiectasia extending from his ankle onto his foot PSYCHIATRIC: The patient has a normal affect.  DATA:  Noninvasive studies were reviewed.  This both the arterial and venous.  This revealed normal ankle arm index and normal triphasic waveforms  in his arterial study  Venous study revealed no DVT.  Did show reflux with enlargement in his great saphenous vein from the saphenofemoral junction down to the mid calf.  I imaged his saphenous vein with SonoSite ultrasound.  Does show enlargement throughout the course and does extend into the varicosities in his medial calf   MEDICAL ISSUES: Leg swelling and discomfort and skin changes which are progressive and related to superficial venous reflux.  I explained the significance of this.  He is to take ibuprofen for discomfort.  Will elevate his legs as much as possible.  He is to be fitted with thigh-high 20 to 30 mmHg graduated compression garments.  Explained that he appears to be an excellent candidate for laser ablation of his left great saphenous vein should he have failure of conservative treatment.  We will see him again in our office in 3 months for continued discussion.  I explained that he would be seeing either Dr. Scot Dock or fields for follow-up   Rosetta Posner, MD [facility] Vascular and Vein Specialists of [facility] Tel [phone] Pager [phone]

## 2018-06-27 DIAGNOSIS — I8312 Varicose veins of left lower extremity with inflammation: Secondary | ICD-10-CM

## 2018-09-26 ENCOUNTER — Ambulatory Visit: Payer: BLUE CROSS/BLUE SHIELD | Admitting: Vascular Surgery

## 2018-11-21 IMAGING — DX DG CHEST 2V
2 series · 2 of 2 positions shown · non-contrast
Comparison: [DATE], 03/30/2013 and earlier.

CLINICAL DATA: [REDACTED] history LEFT UPPER ANTERIOR chest pain. No
known injury. Current smoker.

EXAM:
CHEST - 2 VIEW

[chest pa]
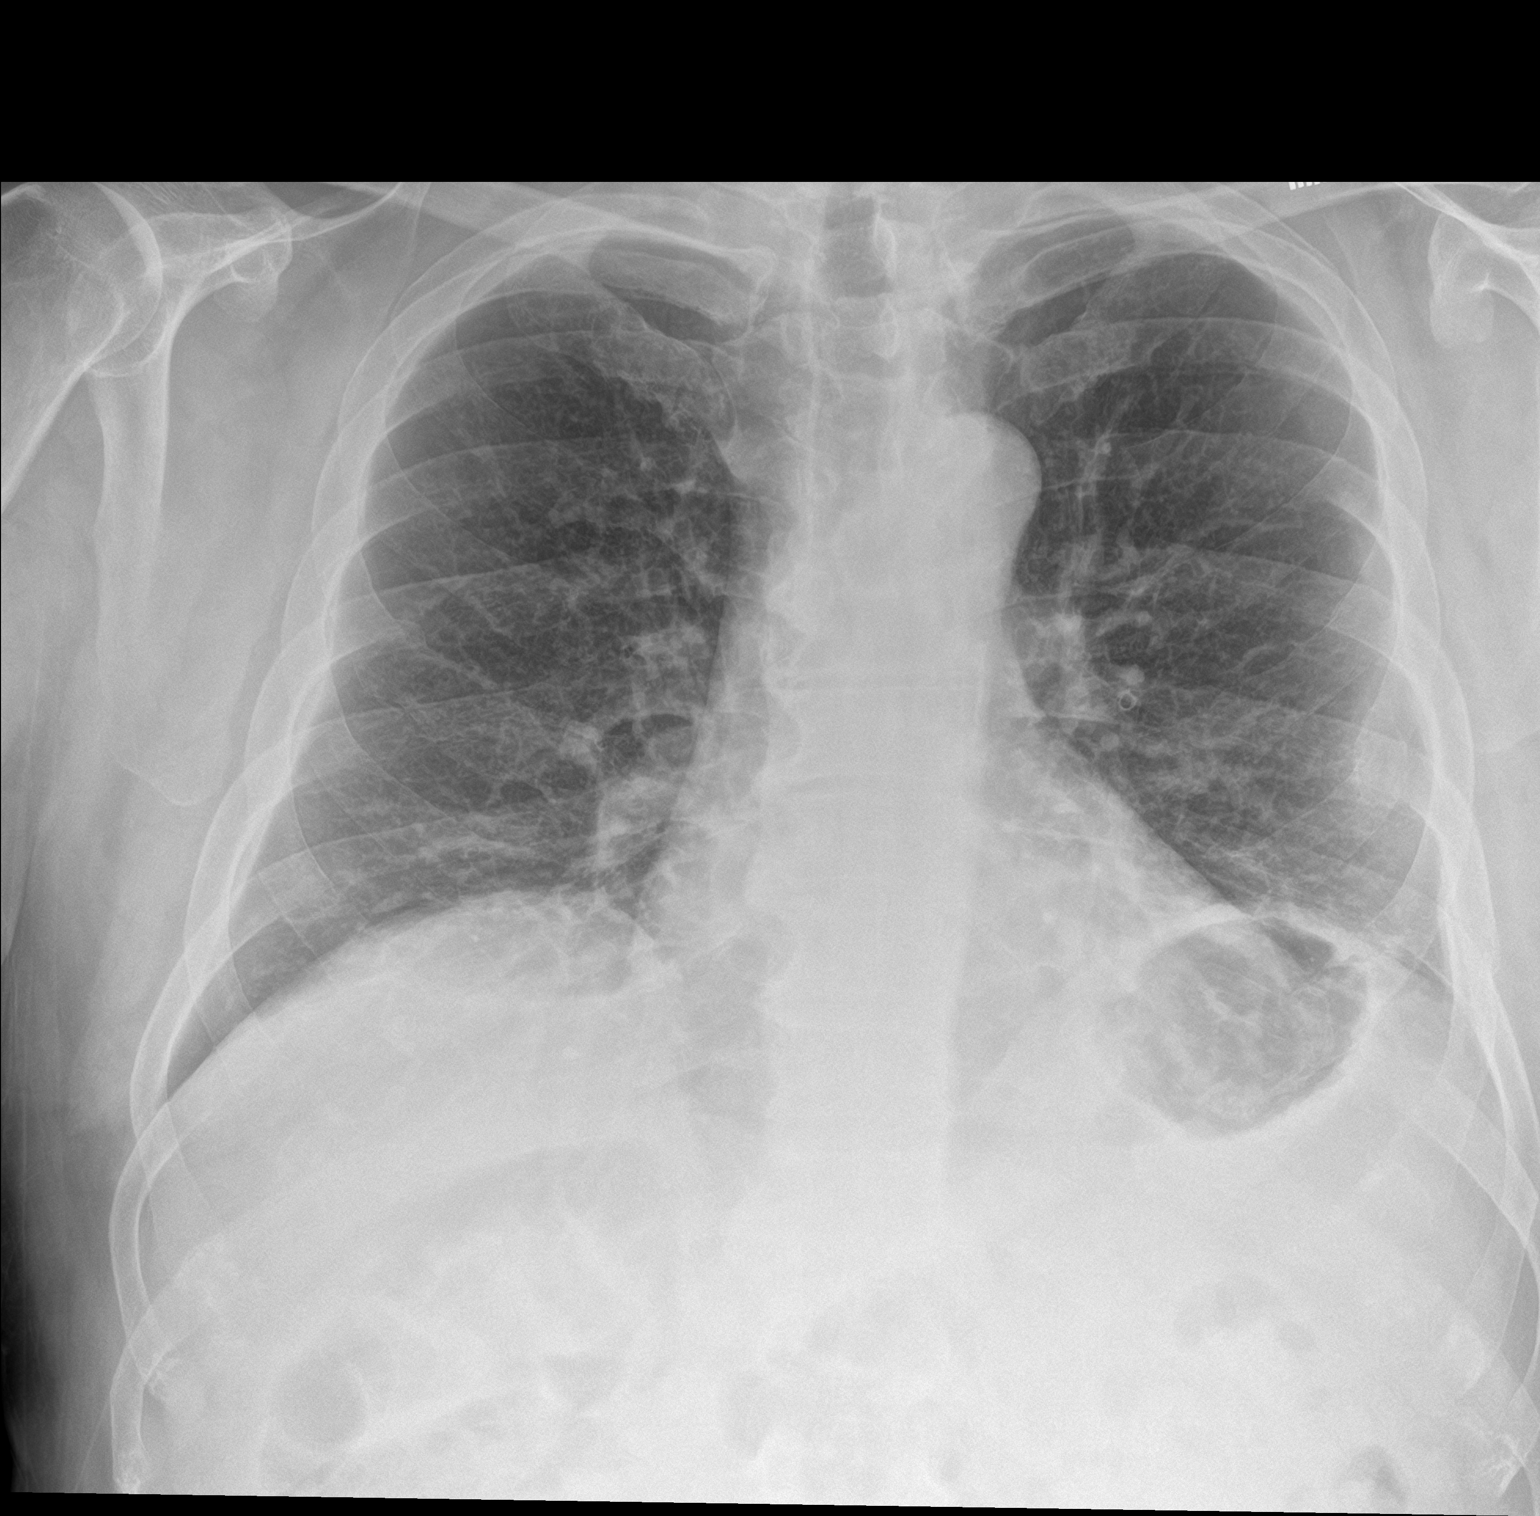

[chest lat]
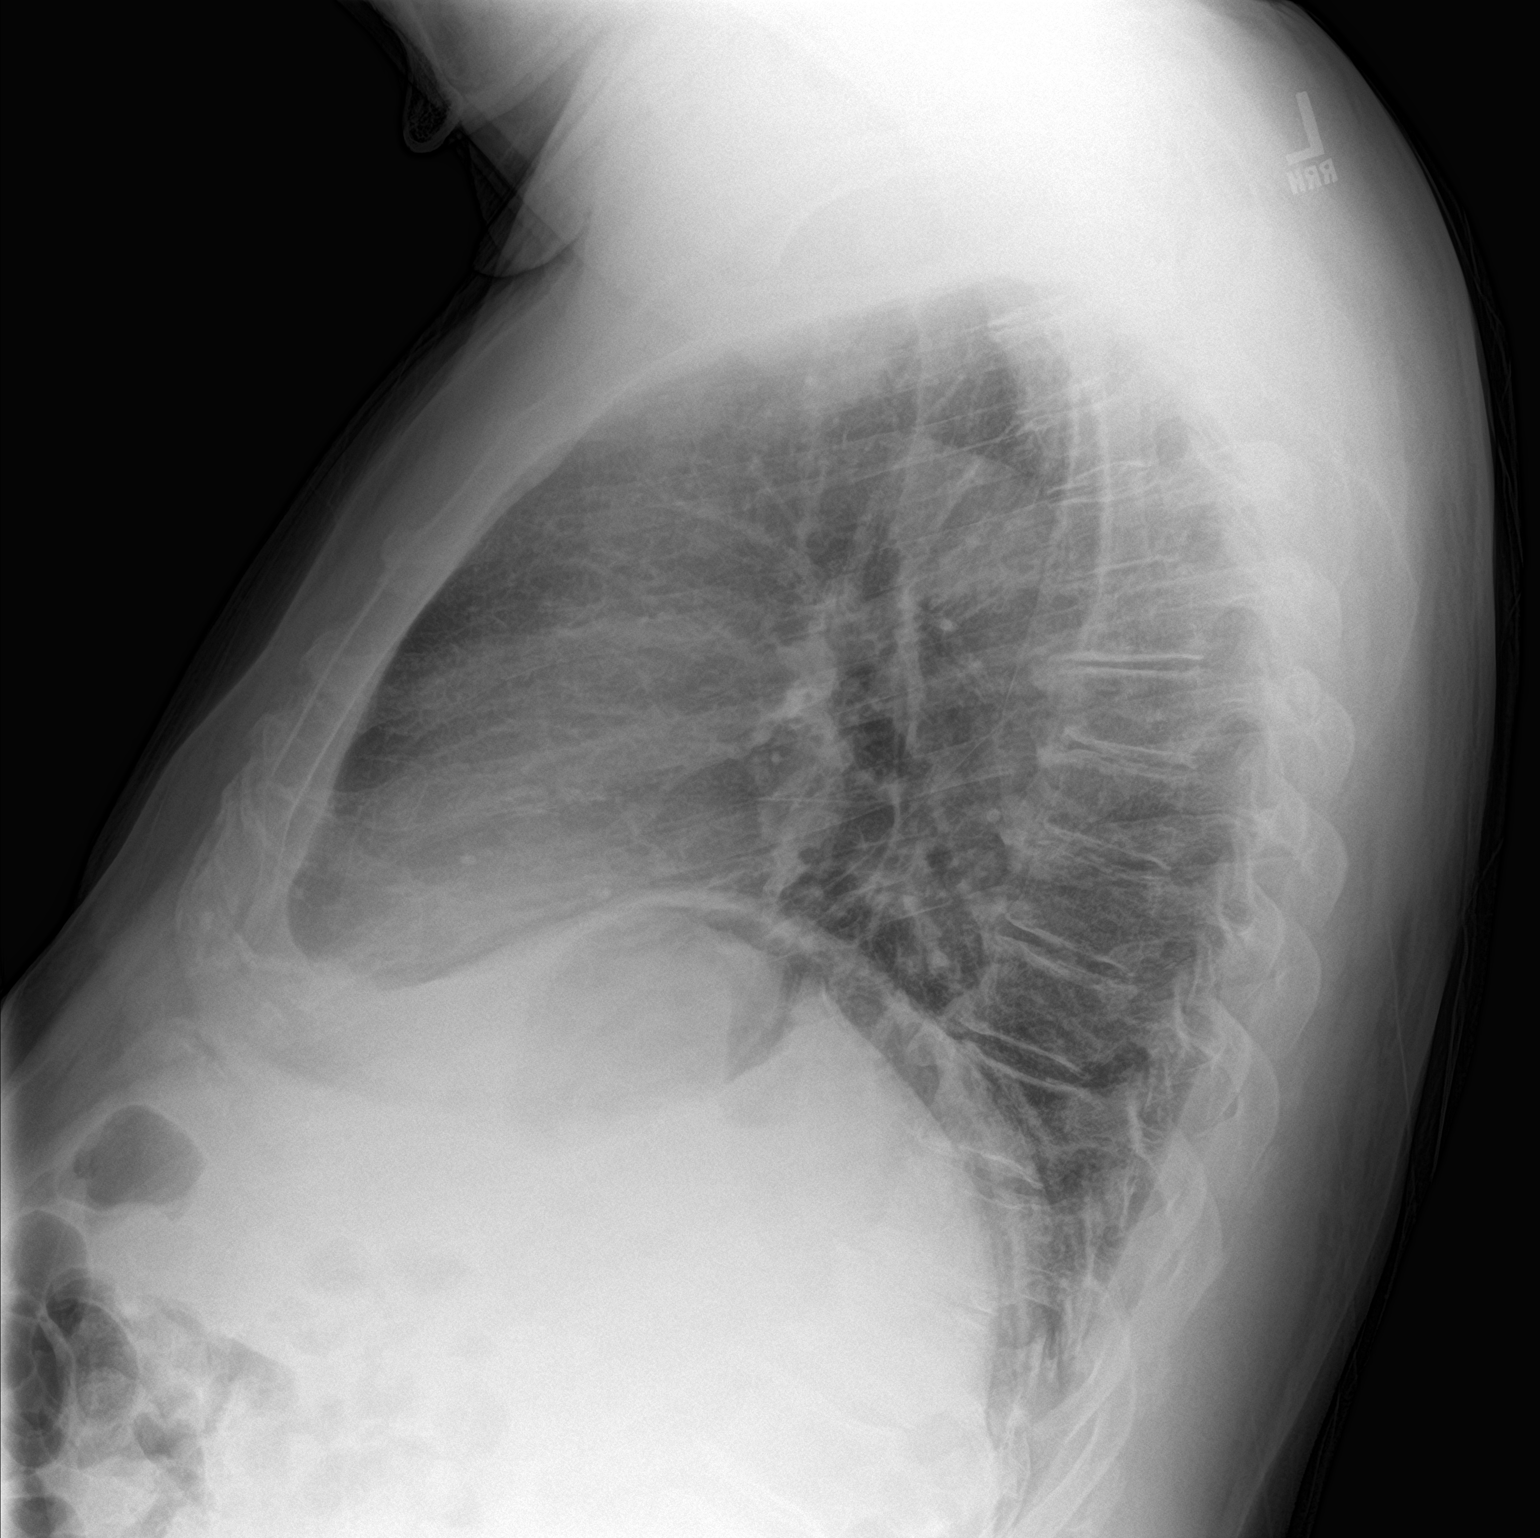

[2 of 2 positions shown; findings below may reference images not displayed]

FINDINGS: Cardiac silhouette normal in size, unchanged. Thoracic aorta mildly
atherosclerotic, unchanged. Hilar and mediastinal contours otherwise
unremarkable. Chronic elevation of both hemidiaphragms with
associated chronic scar/atelectasis involving the lower lobes.
Prominent bronchovascular markings diffusely and moderate central
peribronchial thickening, unchanged. Lungs otherwise clear. No
localized airspace consolidation. No pleural effusions. No
pneumothorax. Normal pulmonary vascularity. Remote healed LEFT rib
fractures. Degenerative changes involving the thoracic and UPPER
lumbar spine.
IMPRESSION: 1.  No acute cardiopulmonary disease.
2. Stable changes of chronic bronchitis and/or asthma. Stable
chronic scar/atelectasis involving the lower lobes related to
chronic elevation of both hemidiaphragms.

## 2018-11-23 IMAGING — NM NM MYOCAR MULTI W/SPECT W/WALL MOTION & EF
2 series · 12 of 12 positions shown · non-contrast
Comparison: none

[Series 1: rest · 6.51mm/px · 6 of 64 frames shown]
[frame 6/64]
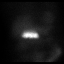
[frame 16/64]
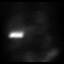
[frame 27/64]
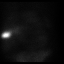
[frame 38/64]
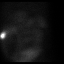
[frame 48/64]
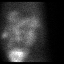
[frame 59/64]
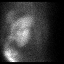

[Series 3: stress gated - perfusion · 6.51mm/px · 6 of 64 frames shown]
[frame 6/64]
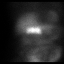
[frame 16/64]
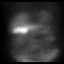
[frame 27/64]
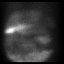
[frame 38/64]
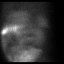
[frame 48/64]
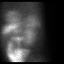
[frame 59/64]
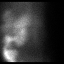

[12 of 12 positions shown; findings below may reference images not displayed]

Canned report from images found in remote index.

Refer to host system for actual result text.

## 2018-12-07 ENCOUNTER — Other Ambulatory Visit: Payer: Self-pay

## 2018-12-07 ENCOUNTER — Other Ambulatory Visit: Payer: BLUE CROSS/BLUE SHIELD

## 2018-12-07 DIAGNOSIS — Z20822 Contact with and (suspected) exposure to covid-19: Secondary | ICD-10-CM

## 2018-12-07 DIAGNOSIS — R6889 Other general symptoms and signs: Secondary | ICD-10-CM | POA: Diagnosis not present

## 2018-12-11 LAB — NOVEL CORONAVIRUS, NAA: SARS-CoV-2, NAA: NOT DETECTED

## 2018-12-18 ENCOUNTER — Telehealth (HOSPITAL_COMMUNITY): Payer: Self-pay | Admitting: Rehabilitation

## 2018-12-18 NOTE — Telephone Encounter (Signed)

## 2018-12-19 ENCOUNTER — Ambulatory Visit (INDEPENDENT_AMBULATORY_CARE_PROVIDER_SITE_OTHER): Payer: BC Managed Care – PPO | Admitting: Vascular Surgery

## 2018-12-19 ENCOUNTER — Other Ambulatory Visit: Payer: Self-pay

## 2018-12-19 ENCOUNTER — Encounter: Payer: Self-pay | Admitting: Vascular Surgery

## 2018-12-19 VITALS — BP 132/81 | HR 93 | Temp 98.2°F | Resp 18 | Ht 73.0 in | Wt 266.0 lb

## 2018-12-19 DIAGNOSIS — I83813 Varicose veins of bilateral lower extremities with pain: Secondary | ICD-10-CM | POA: Diagnosis not present

## 2018-12-19 NOTE — Progress Notes (Signed)
Patient is a 63 year old male who returns for follow-up today.  He saw my partner Dr. Donnetta Hutching February 2020.  This was for fullness heaviness aching in the left lower extremity.  Since that time he is and has developed some swelling in the right lower extremity as well.  He denies any prior history of DVT.  He has been wearing compression stockings since his office visit in February.  He states these helped a little bit but not completely.  Review of systems: He has no shortness of breath.  He has no chest pain.  Past Medical History:  Diagnosis Date  . Colon cancer ([place]) [place] 2014  . Hypertension   . Renal disorder    Kidney stones    Past Surgical History:  Procedure Laterality Date  . COLONOSCOPY N/A 04/29/2013   Procedure: COLONOSCOPY;  Surgeon: Danie Binder, MD;  Location: [facility] SUITE;  Service: Endoscopy;  Laterality: N/A;  10:30AM  . Cyst removed from left elbow    . KIDNEY STONE SURGERY    . LITHOTRIPSY    . PARTIAL COLECTOMY      Current Outpatient Medications on File Prior to Visit  Medication Sig Dispense Refill  . albuterol (PROVENTIL HFA;VENTOLIN HFA) 108 (90 Base) MCG/ACT inhaler Inhale 2 puffs into the lungs every 6 (six) hours as needed for wheezing or shortness of breath. 1 Inhaler 0  . aspirin EC 81 MG EC tablet Take 1 tablet (81 mg total) by mouth daily.    Marland Kitchen atorvastatin (LIPITOR) 20 MG tablet Take 1 tablet by mouth daily.  3  . furosemide (LASIX) 20 MG tablet Take 1 tablet by mouth 2 (two) times daily as needed.  2  . lisinopril (PRINIVIL,ZESTRIL) 20 MG tablet Take 1 tablet by mouth daily.  1  . Multiple Vitamin (MULTI-VITAMIN DAILY PO) Take by mouth.     No current facility-administered medications on file prior to visit.     Allergies  Allergen Reactions  . Penicillins Other (See Comments)    Childhood Allergy  Has patient had a PCN reaction causing immediate rash, facial/tongue/throat swelling, SOB or lightheadedness with hypotension: No Has patient had  a PCN reaction causing severe rash involving mucus membranes or skin necrosis: No Has patient had a PCN reaction that required hospitalization: No Has patient had a PCN reaction occurring within the last 10 years: No If all of the above answers are "NO", then may proceed with Cephalosporin use.     Physical exam:  Vitals:   12/19/18 1252  BP: 132/81  Pulse: 93  Resp: 18  Temp: 98.2 F (36.8 C)  TempSrc: Temporal  SpO2: 92%  Weight: 266 lb (120.7 kg)  Height: 6\' 1"  (1.854 m)    Extremities: 2+ dorsalis pedis pulses  Skin: Scattered spider type varicosities diffusely both legs  Data: I reviewed the patient's prior left leg duplex scan which showed a 5 to 6 mm left greater saphenous vein with diffuse reflux.  He had bilateral ABIs performed in January 2020 as well which were greater than 1 normal bilaterally  I used the SonoSite to examine the patient's left leg today which confirms 5 to 6 mm left greater saphenous vein.  He also has slightly enlarged right greater saphenous vein.  He has not yet had a reflux study on the right side.  Assessment: Venous reflux superficial system left leg with symptoms of swelling fullness heaviness and aching.  He probably also has similar findings in the right leg although a formal  study has not been done at this point.  Patient has been compliant wearing his compression stockings and has not had full symptom relief.  Plan: We will schedule the patient for laser ablation left greater saphenous vein pending insurance approval.  After he has had the left leg treated we will consider whether or not to redo a duplex exam of the right lower extremity as I suspect he will probably have similar findings.  Risk benefits possible complications of procedure details of laser ablation of left greater saphenous vein were discussed with patient today.  He understands and agrees to proceed.  Ruta Hinds, MD [facility] and Vein Specialists of [place] Office:  [phone] Pager: [phone]

## 2019-01-03 DIAGNOSIS — Z6835 Body mass index (BMI) 35.0-35.9, adult: Secondary | ICD-10-CM | POA: Diagnosis not present

## 2019-01-03 DIAGNOSIS — Z0001 Encounter for general adult medical examination with abnormal findings: Secondary | ICD-10-CM | POA: Diagnosis not present

## 2019-01-03 DIAGNOSIS — F17290 Nicotine dependence, other tobacco product, uncomplicated: Secondary | ICD-10-CM | POA: Diagnosis not present

## 2019-01-03 DIAGNOSIS — J449 Chronic obstructive pulmonary disease, unspecified: Secondary | ICD-10-CM | POA: Diagnosis not present

## 2019-01-03 DIAGNOSIS — E6609 Other obesity due to excess calories: Secondary | ICD-10-CM | POA: Diagnosis not present

## 2019-01-03 DIAGNOSIS — I10 Essential (primary) hypertension: Secondary | ICD-10-CM | POA: Diagnosis not present

## 2019-01-03 DIAGNOSIS — R7309 Other abnormal glucose: Secondary | ICD-10-CM | POA: Diagnosis not present

## 2019-01-08 ENCOUNTER — Other Ambulatory Visit: Payer: Self-pay | Admitting: *Deleted

## 2019-01-08 DIAGNOSIS — I83812 Varicose veins of left lower extremities with pain: Secondary | ICD-10-CM

## 2019-01-15 ENCOUNTER — Telehealth (HOSPITAL_COMMUNITY): Payer: Self-pay | Admitting: Rehabilitation

## 2019-01-15 NOTE — Telephone Encounter (Signed)

## 2019-01-16 ENCOUNTER — Encounter: Payer: Self-pay | Admitting: Vascular Surgery

## 2019-01-16 ENCOUNTER — Other Ambulatory Visit: Payer: Self-pay

## 2019-01-16 ENCOUNTER — Ambulatory Visit (INDEPENDENT_AMBULATORY_CARE_PROVIDER_SITE_OTHER): Payer: BC Managed Care – PPO | Admitting: Vascular Surgery

## 2019-01-16 VITALS — BP 129/87 | HR 90 | Temp 98.4°F | Resp 16 | Ht 73.0 in | Wt 260.0 lb

## 2019-01-16 DIAGNOSIS — I83812 Varicose veins of left lower extremities with pain: Secondary | ICD-10-CM

## 2019-01-16 HISTORY — PX: ENDOVENOUS ABLATION SAPHENOUS VEIN W/ LASER: SUR449

## 2019-01-16 NOTE — Progress Notes (Signed)
     Laser Ablation Procedure    Date: 01/16/2019   Sergei Delo Ternes DOB:1955-11-10  Consent signed: Yes    Surgeon:  Dr. Ruta Hinds   Procedure: Laser Ablation: left Greater Saphenous Vein  BP 129/87 (BP Location: Left Arm, Patient Position: Sitting, Cuff Size: Normal)   Pulse 90   Temp 98.4 F (36.9 C) (Temporal)   Resp 16   Ht 6\' 1"  (1.854 m)   Wt 260 lb (117.9 kg)   SpO2 96%   BMI 34.30 kg/m   Tumescent Anesthesia: 450 cc 0.9% NaCl with 50 cc Lidocaine HCL 1%  and 15 cc 8.4% NaHCO3  Local Anesthesia: 5 cc Lidocaine HCL and NaHCO3 (ratio 2:1)  15 watts continuous mode        Total energy: 2026 Joules    Total time: 2:13 Laser fiber Ref # [id number]  Lot # [id number]      Patient tolerated procedure well  Notes: Mr. Shirah wore facial masks. All staff members wore facial masks and facial shields.  Discharge instructions reviewed with patient and hardcopy given to him to take home.   Description of Procedure:  After marking the course of the secondary varicosities, the patient was placed on the operating table in the supine position, and the left leg was prepped and draped in sterile fashion.   Local anesthetic was administered and under ultrasound guidance the saphenous vein was accessed with a micro needle and guide wire; then the mirco puncture sheath was placed.  A guide wire was inserted saphenofemoral junction , followed by a 5 french sheath.  The position of the sheath and then the laser fiber below the junction was confirmed using the ultrasound.  Tumescent anesthesia was administered along the course of the saphenous vein using ultrasound guidance. The patient was placed in Trendelenburg position and protective laser glasses were placed on patient and staff, and the laser was fired at 15 watts continuous mode advancing 1-71mm/second for a total of 2026 joules.     Steri strip was applied to the IV insertion site and ABD pads and thigh high compression stockings  were applied.  Ace wrap bandages were applied  at the top of the saphenofemoral junction. Blood loss was less than 15 cc.  The patient ambulated out of the operating room having tolerated the procedure well.  Ruta Hinds, MD V[facility] and Vein Specialists of [place] Office: [phone] Pager: [phone]

## 2019-01-23 ENCOUNTER — Ambulatory Visit (HOSPITAL_COMMUNITY)
Admission: RE | Admit: 2019-01-23 | Discharge: 2019-01-23 | Disposition: A | Discharge status: Home / Self Care | Payer: BC Managed Care – PPO | Source: Ambulatory Visit | Attending: Family | Admitting: Family

## 2019-01-23 ENCOUNTER — Ambulatory Visit (INDEPENDENT_AMBULATORY_CARE_PROVIDER_SITE_OTHER): Payer: Self-pay | Admitting: Vascular Surgery

## 2019-01-23 ENCOUNTER — Other Ambulatory Visit: Payer: Self-pay

## 2019-01-23 ENCOUNTER — Encounter: Payer: Self-pay | Admitting: Vascular Surgery

## 2019-01-23 VITALS — BP 141/90 | HR 88 | Temp 98.0°F | Resp 20 | Ht 72.0 in | Wt 271.3 lb

## 2019-01-23 DIAGNOSIS — I83812 Varicose veins of left lower extremities with pain: Secondary | ICD-10-CM | POA: Diagnosis not present

## 2019-01-23 NOTE — Progress Notes (Signed)
Patient is a 63 year old male who returns for follow-up today.  He underwent laser ablation of the left greater saphenous vein approximately 1 week ago.  He states the heaviness fullness and aching in his left leg has resolved.  He still has some mild soreness from the procedure.  He has not had any shortness of breath.  He denies hemoptysis.  Physical exam:  Vitals:   01/23/19 1142  BP: (!) 141/90  Pulse: 88  Resp: 20  Temp: 98 F (36.7 C)  TempSrc: Temporal  SpO2: 93%  Weight: 271 lb 4.8 oz (123.1 kg)  Height: 6' (1.829 m)    Left lower extremity mild edema left medial leg no significant ecchymosis  Data: Patient had a duplex ultrasound today which shows successful closure of the left greater saphenous vein with no evidence of DVT.  Assessment: Patient doing well status post laser ablation left greater saphenous vein.  He really has no symptoms in the right leg.  If he develops symptoms in the future we could certainly revisit whether or not to ultrasound the right leg.  He will follow-up on an as-needed basis.  Plan: See above  Ruta Hinds, MD [facility] Vein Specialists of [place] Office: [phone] Pager: [phone]

## 2019-01-30 ENCOUNTER — Ambulatory Visit: Payer: BC Managed Care – PPO | Admitting: Vascular Surgery

## 2019-01-30 ENCOUNTER — Ambulatory Visit (HOSPITAL_COMMUNITY): Payer: BC Managed Care – PPO

## 2019-03-20 DIAGNOSIS — Z23 Encounter for immunization: Secondary | ICD-10-CM | POA: Diagnosis not present

## 2019-04-23 ENCOUNTER — Encounter: Payer: Self-pay | Admitting: Vascular Surgery

## 2019-08-15 DIAGNOSIS — Z23 Encounter for immunization: Secondary | ICD-10-CM | POA: Diagnosis not present

## 2019-09-07 DIAGNOSIS — Z23 Encounter for immunization: Secondary | ICD-10-CM | POA: Diagnosis not present

## 2019-09-24 DIAGNOSIS — J449 Chronic obstructive pulmonary disease, unspecified: Secondary | ICD-10-CM | POA: Diagnosis not present

## 2019-09-24 DIAGNOSIS — Z719 Counseling, unspecified: Secondary | ICD-10-CM | POA: Diagnosis not present

## 2019-09-24 DIAGNOSIS — Z6838 Body mass index (BMI) 38.0-38.9, adult: Secondary | ICD-10-CM | POA: Diagnosis not present

## 2020-01-15 DIAGNOSIS — Z6835 Body mass index (BMI) 35.0-35.9, adult: Secondary | ICD-10-CM | POA: Diagnosis not present

## 2020-01-15 DIAGNOSIS — I700 Atherosclerosis of aorta: Secondary | ICD-10-CM | POA: Diagnosis not present

## 2020-01-15 DIAGNOSIS — J449 Chronic obstructive pulmonary disease, unspecified: Secondary | ICD-10-CM | POA: Diagnosis not present

## 2020-01-15 DIAGNOSIS — I10 Essential (primary) hypertension: Secondary | ICD-10-CM | POA: Diagnosis not present

## 2020-01-15 DIAGNOSIS — C189 Malignant neoplasm of colon, unspecified: Secondary | ICD-10-CM | POA: Diagnosis not present

## 2020-01-15 DIAGNOSIS — E7849 Other hyperlipidemia: Secondary | ICD-10-CM | POA: Diagnosis not present

## 2020-01-15 DIAGNOSIS — E6609 Other obesity due to excess calories: Secondary | ICD-10-CM | POA: Diagnosis not present

## 2020-01-15 DIAGNOSIS — Z85038 Personal history of other malignant neoplasm of large intestine: Secondary | ICD-10-CM | POA: Diagnosis not present

## 2020-01-15 DIAGNOSIS — F17200 Nicotine dependence, unspecified, uncomplicated: Secondary | ICD-10-CM | POA: Diagnosis not present

## 2020-01-15 DIAGNOSIS — Z0001 Encounter for general adult medical examination with abnormal findings: Secondary | ICD-10-CM | POA: Diagnosis not present

## 2020-01-15 DIAGNOSIS — Z1389 Encounter for screening for other disorder: Secondary | ICD-10-CM | POA: Diagnosis not present

## 2020-04-21 DIAGNOSIS — R7309 Other abnormal glucose: Secondary | ICD-10-CM | POA: Diagnosis not present

## 2020-04-21 DIAGNOSIS — Z23 Encounter for immunization: Secondary | ICD-10-CM | POA: Diagnosis not present

## 2020-10-22 DIAGNOSIS — Z719 Counseling, unspecified: Secondary | ICD-10-CM | POA: Diagnosis not present

## 2020-10-22 DIAGNOSIS — Z6836 Body mass index (BMI) 36.0-36.9, adult: Secondary | ICD-10-CM | POA: Diagnosis not present

## 2020-10-22 DIAGNOSIS — J441 Chronic obstructive pulmonary disease with (acute) exacerbation: Secondary | ICD-10-CM | POA: Diagnosis not present

## 2020-10-22 DIAGNOSIS — Z1331 Encounter for screening for depression: Secondary | ICD-10-CM | POA: Diagnosis not present

## 2020-10-22 DIAGNOSIS — E6609 Other obesity due to excess calories: Secondary | ICD-10-CM | POA: Diagnosis not present

## 2020-10-22 DIAGNOSIS — R69 Illness, unspecified: Secondary | ICD-10-CM | POA: Diagnosis not present

## 2021-01-05 DIAGNOSIS — N529 Male erectile dysfunction, unspecified: Secondary | ICD-10-CM | POA: Diagnosis not present

## 2021-01-05 DIAGNOSIS — R69 Illness, unspecified: Secondary | ICD-10-CM | POA: Diagnosis not present

## 2021-01-05 DIAGNOSIS — J449 Chronic obstructive pulmonary disease, unspecified: Secondary | ICD-10-CM | POA: Diagnosis not present

## 2021-01-05 DIAGNOSIS — Z008 Encounter for other general examination: Secondary | ICD-10-CM | POA: Diagnosis not present

## 2021-01-05 DIAGNOSIS — E785 Hyperlipidemia, unspecified: Secondary | ICD-10-CM | POA: Diagnosis not present

## 2021-01-05 DIAGNOSIS — R609 Edema, unspecified: Secondary | ICD-10-CM | POA: Diagnosis not present

## 2021-01-05 DIAGNOSIS — K08409 Partial loss of teeth, unspecified cause, unspecified class: Secondary | ICD-10-CM | POA: Diagnosis not present

## 2021-01-05 DIAGNOSIS — Z791 Long term (current) use of non-steroidal anti-inflammatories (NSAID): Secondary | ICD-10-CM | POA: Diagnosis not present

## 2021-01-05 DIAGNOSIS — R030 Elevated blood-pressure reading, without diagnosis of hypertension: Secondary | ICD-10-CM | POA: Diagnosis not present

## 2021-01-05 DIAGNOSIS — Z6835 Body mass index (BMI) 35.0-35.9, adult: Secondary | ICD-10-CM | POA: Diagnosis not present

## 2021-03-04 DIAGNOSIS — I10 Essential (primary) hypertension: Secondary | ICD-10-CM | POA: Diagnosis not present

## 2021-03-04 DIAGNOSIS — Z0001 Encounter for general adult medical examination with abnormal findings: Secondary | ICD-10-CM | POA: Diagnosis not present

## 2021-03-04 DIAGNOSIS — Z125 Encounter for screening for malignant neoplasm of prostate: Secondary | ICD-10-CM | POA: Diagnosis not present

## 2021-03-04 DIAGNOSIS — E782 Mixed hyperlipidemia: Secondary | ICD-10-CM | POA: Diagnosis not present

## 2021-03-04 DIAGNOSIS — E6609 Other obesity due to excess calories: Secondary | ICD-10-CM | POA: Diagnosis not present

## 2021-03-04 DIAGNOSIS — R69 Illness, unspecified: Secondary | ICD-10-CM | POA: Diagnosis not present

## 2021-03-04 DIAGNOSIS — Z23 Encounter for immunization: Secondary | ICD-10-CM | POA: Diagnosis not present

## 2021-03-04 DIAGNOSIS — F17200 Nicotine dependence, unspecified, uncomplicated: Secondary | ICD-10-CM | POA: Diagnosis not present

## 2021-03-04 DIAGNOSIS — Z6837 Body mass index (BMI) 37.0-37.9, adult: Secondary | ICD-10-CM | POA: Diagnosis not present

## 2021-03-04 DIAGNOSIS — J449 Chronic obstructive pulmonary disease, unspecified: Secondary | ICD-10-CM | POA: Diagnosis not present

## 2021-03-04 DIAGNOSIS — Z1331 Encounter for screening for depression: Secondary | ICD-10-CM | POA: Diagnosis not present

## 2021-04-27 DIAGNOSIS — Z6837 Body mass index (BMI) 37.0-37.9, adult: Secondary | ICD-10-CM | POA: Diagnosis not present

## 2021-04-27 DIAGNOSIS — I10 Essential (primary) hypertension: Secondary | ICD-10-CM | POA: Diagnosis not present

## 2021-04-27 DIAGNOSIS — E782 Mixed hyperlipidemia: Secondary | ICD-10-CM | POA: Diagnosis not present

## 2021-04-27 DIAGNOSIS — R69 Illness, unspecified: Secondary | ICD-10-CM | POA: Diagnosis not present

## 2021-04-27 DIAGNOSIS — J449 Chronic obstructive pulmonary disease, unspecified: Secondary | ICD-10-CM | POA: Diagnosis not present

## 2021-05-18 ENCOUNTER — Other Ambulatory Visit: Payer: Self-pay

## 2021-05-18 ENCOUNTER — Telehealth: Payer: Self-pay

## 2021-05-18 DIAGNOSIS — M7989 Other specified soft tissue disorders: Secondary | ICD-10-CM

## 2021-05-18 NOTE — Telephone Encounter (Signed)
Patient last seen in 2020 and had laser ablation. Calls today to report some swelling in that left leg. Says when he elevates it the swelling goes down. Scheduled for a reflux study and to see MD.

## 2021-05-31 ENCOUNTER — Emergency Department (HOSPITAL_COMMUNITY): Payer: Medicare HMO

## 2021-05-31 ENCOUNTER — Inpatient Hospital Stay (HOSPITAL_COMMUNITY)
Admission: EM | Admit: 2021-05-31 | Discharge: 2021-06-05 | DRG: 291 | Disposition: A | Discharge status: Home Health | Payer: Medicare HMO | Source: Ambulatory Visit | Attending: Family Medicine | Admitting: Family Medicine

## 2021-05-31 ENCOUNTER — Encounter (HOSPITAL_COMMUNITY): Payer: Self-pay | Admitting: *Deleted

## 2021-05-31 ENCOUNTER — Other Ambulatory Visit: Payer: Self-pay

## 2021-05-31 DIAGNOSIS — I5033 Acute on chronic diastolic (congestive) heart failure: Secondary | ICD-10-CM | POA: Diagnosis not present

## 2021-05-31 DIAGNOSIS — Z85038 Personal history of other malignant neoplasm of large intestine: Secondary | ICD-10-CM

## 2021-05-31 DIAGNOSIS — I110 Hypertensive heart disease with heart failure: Secondary | ICD-10-CM | POA: Diagnosis not present

## 2021-05-31 DIAGNOSIS — I2510 Atherosclerotic heart disease of native coronary artery without angina pectoris: Secondary | ICD-10-CM | POA: Diagnosis not present

## 2021-05-31 DIAGNOSIS — J441 Chronic obstructive pulmonary disease with (acute) exacerbation: Secondary | ICD-10-CM | POA: Diagnosis present

## 2021-05-31 DIAGNOSIS — I484 Atypical atrial flutter: Secondary | ICD-10-CM | POA: Diagnosis not present

## 2021-05-31 DIAGNOSIS — R739 Hyperglycemia, unspecified: Secondary | ICD-10-CM | POA: Diagnosis not present

## 2021-05-31 DIAGNOSIS — Z880 Allergy status to penicillin: Secondary | ICD-10-CM

## 2021-05-31 DIAGNOSIS — Z20822 Contact with and (suspected) exposure to covid-19: Secondary | ICD-10-CM | POA: Diagnosis not present

## 2021-05-31 DIAGNOSIS — I272 Pulmonary hypertension, unspecified: Secondary | ICD-10-CM | POA: Diagnosis not present

## 2021-05-31 DIAGNOSIS — R7989 Other specified abnormal findings of blood chemistry: Secondary | ICD-10-CM | POA: Diagnosis not present

## 2021-05-31 DIAGNOSIS — E785 Hyperlipidemia, unspecified: Secondary | ICD-10-CM | POA: Diagnosis present

## 2021-05-31 DIAGNOSIS — I5031 Acute diastolic (congestive) heart failure: Secondary | ICD-10-CM | POA: Diagnosis not present

## 2021-05-31 DIAGNOSIS — G4730 Sleep apnea, unspecified: Secondary | ICD-10-CM | POA: Diagnosis present

## 2021-05-31 DIAGNOSIS — E782 Mixed hyperlipidemia: Secondary | ICD-10-CM

## 2021-05-31 DIAGNOSIS — F17210 Nicotine dependence, cigarettes, uncomplicated: Secondary | ICD-10-CM | POA: Diagnosis not present

## 2021-05-31 DIAGNOSIS — E1165 Type 2 diabetes mellitus with hyperglycemia: Secondary | ICD-10-CM | POA: Diagnosis present

## 2021-05-31 DIAGNOSIS — R69 Illness, unspecified: Secondary | ICD-10-CM | POA: Diagnosis not present

## 2021-05-31 DIAGNOSIS — J449 Chronic obstructive pulmonary disease, unspecified: Secondary | ICD-10-CM | POA: Diagnosis present

## 2021-05-31 DIAGNOSIS — Z79899 Other long term (current) drug therapy: Secondary | ICD-10-CM

## 2021-05-31 DIAGNOSIS — C189 Malignant neoplasm of colon, unspecified: Secondary | ICD-10-CM | POA: Diagnosis present

## 2021-05-31 DIAGNOSIS — Z7901 Long term (current) use of anticoagulants: Secondary | ICD-10-CM

## 2021-05-31 DIAGNOSIS — Z9049 Acquired absence of other specified parts of digestive tract: Secondary | ICD-10-CM | POA: Diagnosis not present

## 2021-05-31 DIAGNOSIS — I4892 Unspecified atrial flutter: Secondary | ICD-10-CM

## 2021-05-31 DIAGNOSIS — F17200 Nicotine dependence, unspecified, uncomplicated: Secondary | ICD-10-CM | POA: Diagnosis not present

## 2021-05-31 DIAGNOSIS — J438 Other emphysema: Secondary | ICD-10-CM | POA: Diagnosis not present

## 2021-05-31 DIAGNOSIS — Z6834 Body mass index (BMI) 34.0-34.9, adult: Secondary | ICD-10-CM

## 2021-05-31 DIAGNOSIS — E119 Type 2 diabetes mellitus without complications: Secondary | ICD-10-CM

## 2021-05-31 DIAGNOSIS — I169 Hypertensive crisis, unspecified: Secondary | ICD-10-CM | POA: Diagnosis present

## 2021-05-31 DIAGNOSIS — R0902 Hypoxemia: Secondary | ICD-10-CM | POA: Diagnosis not present

## 2021-05-31 DIAGNOSIS — I340 Nonrheumatic mitral (valve) insufficiency: Secondary | ICD-10-CM | POA: Diagnosis not present

## 2021-05-31 DIAGNOSIS — J9601 Acute respiratory failure with hypoxia: Secondary | ICD-10-CM | POA: Diagnosis not present

## 2021-05-31 DIAGNOSIS — R609 Edema, unspecified: Secondary | ICD-10-CM | POA: Diagnosis not present

## 2021-05-31 DIAGNOSIS — Z1331 Encounter for screening for depression: Secondary | ICD-10-CM | POA: Diagnosis not present

## 2021-05-31 DIAGNOSIS — R0600 Dyspnea, unspecified: Secondary | ICD-10-CM

## 2021-05-31 DIAGNOSIS — Z720 Tobacco use: Secondary | ICD-10-CM

## 2021-05-31 DIAGNOSIS — Z7951 Long term (current) use of inhaled steroids: Secondary | ICD-10-CM

## 2021-05-31 DIAGNOSIS — Z87442 Personal history of urinary calculi: Secondary | ICD-10-CM | POA: Diagnosis not present

## 2021-05-31 DIAGNOSIS — I361 Nonrheumatic tricuspid (valve) insufficiency: Secondary | ICD-10-CM | POA: Diagnosis not present

## 2021-05-31 DIAGNOSIS — I10 Essential (primary) hypertension: Secondary | ICD-10-CM

## 2021-05-31 DIAGNOSIS — I509 Heart failure, unspecified: Secondary | ICD-10-CM | POA: Diagnosis not present

## 2021-05-31 DIAGNOSIS — Z6841 Body Mass Index (BMI) 40.0 and over, adult: Secondary | ICD-10-CM | POA: Diagnosis not present

## 2021-05-31 DIAGNOSIS — R0602 Shortness of breath: Secondary | ICD-10-CM | POA: Diagnosis not present

## 2021-05-31 DIAGNOSIS — I499 Cardiac arrhythmia, unspecified: Secondary | ICD-10-CM | POA: Diagnosis not present

## 2021-05-31 LAB — VITAMIN D 25 HYDROXY (VIT D DEFICIENCY, FRACTURES): Vit D, 25-Hydroxy: 53.75 ng/mL (ref 30–100)

## 2021-05-31 LAB — CBC WITH DIFFERENTIAL/PLATELET
Abs Immature Granulocytes: 0.04 10*3/uL (ref 0.00–0.07)
Basophils Absolute: 0 10*3/uL (ref 0.0–0.1)
Basophils Relative: 1 %
Eosinophils Absolute: 0 10*3/uL (ref 0.0–0.5)
Eosinophils Relative: 0 %
HCT: 45.4 % (ref 39.0–52.0)
Hemoglobin: 14.1 g/dL (ref 13.0–17.0)
Immature Granulocytes: 1 %
Lymphocytes Relative: 14 %
Lymphs Abs: 1.2 10*3/uL (ref 0.7–4.0)
MCH: 32.9 pg (ref 26.0–34.0)
MCHC: 31.1 g/dL (ref 30.0–36.0)
MCV: 105.8 fL — ABNORMAL HIGH (ref 80.0–100.0)
Monocytes Absolute: 0.9 10*3/uL (ref 0.1–1.0)
Monocytes Relative: 10 %
Neutro Abs: 6.5 10*3/uL (ref 1.7–7.7)
Neutrophils Relative %: 74 %
Platelets: 180 10*3/uL (ref 150–400)
RBC: 4.29 MIL/uL (ref 4.22–5.81)
RDW: 14.4 % (ref 11.5–15.5)
WBC: 8.6 10*3/uL (ref 4.0–10.5)
nRBC: 0 % (ref 0.0–0.2)

## 2021-05-31 LAB — COMPREHENSIVE METABOLIC PANEL
ALT: 27 U/L (ref 0–44)
AST: 28 U/L (ref 15–41)
Albumin: 3.8 g/dL (ref 3.5–5.0)
Alkaline Phosphatase: 112 U/L (ref 38–126)
Anion gap: 7 (ref 5–15)
BUN: 20 mg/dL (ref 8–23)
CO2: 28 mmol/L (ref 22–32)
Calcium: 8.8 mg/dL — ABNORMAL LOW (ref 8.9–10.3)
Chloride: 102 mmol/L (ref 98–111)
Creatinine, Ser: 1.19 mg/dL (ref 0.61–1.24)
GFR, Estimated: >60 mL/min (ref >60)
Glucose, Bld: 158 mg/dL — ABNORMAL HIGH (ref 70–99)
Potassium: 4.5 mmol/L (ref 3.5–5.1)
Sodium: 137 mmol/L (ref 135–145)
Total Bilirubin: 1.9 mg/dL — ABNORMAL HIGH (ref 0.3–1.2)
Total Protein: 7 g/dL (ref 6.5–8.1)

## 2021-05-31 LAB — LIPID PANEL
Cholesterol: 86 mg/dL (ref 0–200)
HDL: 41 mg/dL (ref >40)
LDL Cholesterol: 35 mg/dL (ref 0–99)
Total CHOL/HDL Ratio: 2.1 RATIO
Triglycerides: 52 mg/dL (ref <150)
VLDL: 10 mg/dL (ref 0–40)

## 2021-05-31 LAB — MAGNESIUM: Magnesium: 1.8 mg/dL (ref 1.7–2.4)

## 2021-05-31 LAB — HEMOGLOBIN A1C
Hgb A1c MFr Bld: 7.5 % — ABNORMAL HIGH (ref 4.8–5.6)
Mean Plasma Glucose: 168.55 mg/dL

## 2021-05-31 LAB — RESP PANEL BY RT-PCR (FLU A&B, COVID) ARPGX2
Influenza A by PCR: NEGATIVE
Influenza B by PCR: NEGATIVE
SARS Coronavirus 2 by RT PCR: NEGATIVE

## 2021-05-31 LAB — CBG MONITORING, ED
Glucose-Capillary: 108 mg/dL — ABNORMAL HIGH (ref 70–99)
Glucose-Capillary: 112 mg/dL — ABNORMAL HIGH (ref 70–99)

## 2021-05-31 LAB — TROPONIN I (HIGH SENSITIVITY)
Troponin I (High Sensitivity): 22 ng/L — ABNORMAL HIGH (ref <18)
Troponin I (High Sensitivity): 20 ng/L — ABNORMAL HIGH (ref <18)

## 2021-05-31 LAB — TSH: TSH: 3.031 u[IU]/mL (ref 0.350–4.500)

## 2021-05-31 LAB — BRAIN NATRIURETIC PEPTIDE: B Natriuretic Peptide: 155 pg/mL — ABNORMAL HIGH (ref 0.0–100.0)

## 2021-05-31 LAB — GLUCOSE, CAPILLARY
Glucose-Capillary: 107 mg/dL — ABNORMAL HIGH (ref 70–99)
Glucose-Capillary: 124 mg/dL — ABNORMAL HIGH (ref 70–99)

## 2021-05-31 LAB — HIV ANTIBODY (ROUTINE TESTING W REFLEX): HIV Screen 4th Generation wRfx: NONREACTIVE

## 2021-05-31 MED ORDER — BISACODYL 5 MG PO TBEC
5.0000 mg | DELAYED_RELEASE_TABLET | Freq: Every day | ORAL | Status: DC | PRN
  Administered 2021-06-03: 5 mg via ORAL
  Filled 2021-05-31: qty 1

## 2021-05-31 MED ORDER — DILTIAZEM LOAD VIA INFUSION
10.0000 mg | Freq: Once | INTRAVENOUS | Status: AC
  Administered 2021-05-31: 10 mg via INTRAVENOUS
  Filled 2021-05-31: qty 10

## 2021-05-31 MED ORDER — FUROSEMIDE 10 MG/ML IJ SOLN
80.0000 mg | Freq: Two times a day (BID) | INTRAMUSCULAR | Status: DC
  Administered 2021-05-31 – 2021-06-03 (×6): 80 mg via INTRAVENOUS
  Filled 2021-05-31 (×6): qty 8

## 2021-05-31 MED ORDER — APIXABAN 5 MG PO TABS
5.0000 mg | ORAL_TABLET | Freq: Two times a day (BID) | ORAL | Status: DC
  Administered 2021-05-31 – 2021-06-05 (×11): 5 mg via ORAL
  Filled 2021-05-31 (×11): qty 1

## 2021-05-31 MED ORDER — OXYCODONE HCL 5 MG PO TABS: 5.0000 mg | ORAL_TABLET | ORAL | Status: DC | PRN

## 2021-05-31 MED ORDER — DILTIAZEM HCL-DEXTROSE 125-5 MG/125ML-% IV SOLN (PREMIX)
5.0000 mg/h | INTRAVENOUS | Status: DC
  Administered 2021-05-31: 5 mg/h via INTRAVENOUS
  Administered 2021-05-31 – 2021-06-03 (×6): 15 mg/h via INTRAVENOUS
  Filled 2021-05-31 (×9): qty 125

## 2021-05-31 MED ORDER — ATORVASTATIN CALCIUM 20 MG PO TABS
20.0000 mg | ORAL_TABLET | Freq: Every evening | ORAL | Status: DC
  Administered 2021-05-31 – 2021-06-04 (×5): 20 mg via ORAL
  Filled 2021-05-31 (×5): qty 1

## 2021-05-31 MED ORDER — ALBUTEROL SULFATE (2.5 MG/3ML) 0.083% IN NEBU
3.0000 mL | INHALATION_SOLUTION | Freq: Four times a day (QID) | RESPIRATORY_TRACT | Status: DC | PRN
  Administered 2021-06-04: 3 mL via RESPIRATORY_TRACT
  Filled 2021-05-31: qty 3

## 2021-05-31 MED ORDER — INSULIN ASPART 100 UNIT/ML IJ SOLN
0.0000 [IU] | Freq: Three times a day (TID) | INTRAMUSCULAR | Status: DC
  Administered 2021-06-01: 13:00:00 3 [IU] via SUBCUTANEOUS
  Administered 2021-06-01: 2 [IU] via SUBCUTANEOUS
  Administered 2021-06-02: 5 [IU] via SUBCUTANEOUS
  Administered 2021-06-02: 09:00:00 2 [IU] via SUBCUTANEOUS
  Administered 2021-06-02: 3 [IU] via SUBCUTANEOUS
  Administered 2021-06-03 (×2): 2 [IU] via SUBCUTANEOUS
  Administered 2021-06-03: 5 [IU] via SUBCUTANEOUS
  Administered 2021-06-04: 3 [IU] via SUBCUTANEOUS
  Administered 2021-06-04 (×2): 5 [IU] via SUBCUTANEOUS
  Administered 2021-06-05: 2 [IU] via SUBCUTANEOUS
  Administered 2021-06-05: 5 [IU] via SUBCUTANEOUS

## 2021-05-31 MED ORDER — UMECLIDINIUM BROMIDE 62.5 MCG/ACT IN AEPB
1.0000 | INHALATION_SPRAY | Freq: Every day | RESPIRATORY_TRACT | Status: DC
  Administered 2021-06-01 – 2021-06-05 (×5): 1 via RESPIRATORY_TRACT
  Filled 2021-05-31: qty 7

## 2021-05-31 MED ORDER — BUDESON-GLYCOPYRROL-FORMOTEROL 160-9-4.8 MCG/ACT IN AERO
2.0000 | INHALATION_SPRAY | Freq: Every day | RESPIRATORY_TRACT | Status: DC
Start: 2021-05-31 — End: 2021-05-31

## 2021-05-31 MED ORDER — POTASSIUM CHLORIDE CRYS ER 10 MEQ PO TBCR
10.0000 meq | EXTENDED_RELEASE_TABLET | Freq: Two times a day (BID) | ORAL | Status: DC
  Administered 2021-05-31 – 2021-06-05 (×10): 10 meq via ORAL
  Filled 2021-05-31 (×10): qty 1

## 2021-05-31 MED ORDER — NICOTINE 21 MG/24HR TD PT24
21.0000 mg | MEDICATED_PATCH | Freq: Every day | TRANSDERMAL | Status: DC
  Administered 2021-06-02: 08:00:00 21 mg via TRANSDERMAL
  Filled 2021-05-31 (×4): qty 1

## 2021-05-31 MED ORDER — FUROSEMIDE 10 MG/ML IJ SOLN
40.0000 mg | Freq: Once | INTRAMUSCULAR | Status: AC
  Administered 2021-05-31: 40 mg via INTRAVENOUS
  Filled 2021-05-31: qty 4

## 2021-05-31 MED ORDER — BUDESONIDE 0.25 MG/2ML IN SUSP
0.2500 mg | Freq: Two times a day (BID) | RESPIRATORY_TRACT | Status: DC
  Administered 2021-05-31 – 2021-06-05 (×10): 0.25 mg via RESPIRATORY_TRACT
  Filled 2021-05-31 (×10): qty 2

## 2021-05-31 MED ORDER — DIGOXIN 125 MCG PO TABS
0.2500 mg | ORAL_TABLET | Freq: Every day | ORAL | Status: DC
  Administered 2021-06-01 – 2021-06-03 (×3): 0.25 mg via ORAL
  Filled 2021-05-31 (×3): qty 2

## 2021-05-31 MED ORDER — ONDANSETRON HCL 4 MG/2ML IJ SOLN: 4.0000 mg | Freq: Four times a day (QID) | INTRAMUSCULAR | Status: DC | PRN

## 2021-05-31 MED ORDER — ORAL CARE MOUTH RINSE
15.0000 mL | Freq: Two times a day (BID) | OROMUCOSAL | Status: DC
  Administered 2021-05-31 – 2021-06-05 (×8): 15 mL via OROMUCOSAL

## 2021-05-31 MED ORDER — ACETAMINOPHEN 325 MG PO TABS: 650.0000 mg | ORAL_TABLET | Freq: Four times a day (QID) | ORAL | Status: DC | PRN

## 2021-05-31 MED ORDER — ACETAMINOPHEN 650 MG RE SUPP: 650.0000 mg | Freq: Four times a day (QID) | RECTAL | Status: DC | PRN

## 2021-05-31 MED ORDER — ONDANSETRON HCL 4 MG PO TABS: 4.0000 mg | ORAL_TABLET | Freq: Four times a day (QID) | ORAL | Status: DC | PRN

## 2021-05-31 MED ORDER — CHLORHEXIDINE GLUCONATE CLOTH 2 % EX PADS
6.0000 | MEDICATED_PAD | Freq: Every day | CUTANEOUS | Status: DC
  Administered 2021-06-01 – 2021-06-05 (×5): 6 via TOPICAL

## 2021-05-31 MED ORDER — FUROSEMIDE 10 MG/ML IJ SOLN: 40.0000 mg | Freq: Two times a day (BID) | INTRAMUSCULAR | Status: DC

## 2021-05-31 MED ORDER — DIGOXIN 0.25 MG/ML IJ SOLN
0.5000 mg | Freq: Once | INTRAMUSCULAR | Status: AC
  Administered 2021-05-31: 0.5 mg via INTRAVENOUS
  Filled 2021-05-31: qty 2

## 2021-05-31 MED ORDER — TRAZODONE HCL 50 MG PO TABS
50.0000 mg | ORAL_TABLET | Freq: Every evening | ORAL | Status: DC | PRN
  Administered 2021-06-01 – 2021-06-02 (×2): 50 mg via ORAL
  Filled 2021-05-31 (×2): qty 1

## 2021-05-31 MED ORDER — ARFORMOTEROL TARTRATE 15 MCG/2ML IN NEBU
15.0000 ug | INHALATION_SOLUTION | Freq: Two times a day (BID) | RESPIRATORY_TRACT | Status: DC
  Administered 2021-05-31 – 2021-06-05 (×10): 15 ug via RESPIRATORY_TRACT
  Filled 2021-05-31 (×10): qty 2

## 2021-05-31 MED ORDER — METOPROLOL TARTRATE 5 MG/5ML IV SOLN
2.5000 mg | Freq: Once | INTRAVENOUS | Status: AC
  Administered 2021-05-31: 2.5 mg via INTRAVENOUS
  Filled 2021-05-31: qty 5

## 2021-05-31 NOTE — ED Triage Notes (Signed)
Pt c/o palpitations and SOB. Pt went to his PCP this morning and pt was 82% on RA, had a 20lb weight gain since 04/27/21 and an irregular heart rhythm.

## 2021-05-31 NOTE — H&P (Signed)
History and Physical  Gideon WUG:[id number] DOB: Feb 11, 1956 DOA: 05/31/2021  PCP: Cory Munch, PA-C  Patient coming from: Home  Level of care: Stepdown  I have personally briefly reviewed patient's old medical records in [place]  Chief Complaint: SOB and palpitations   HPI: [name] is a 67 y.o. male with medical history significant chronic smoking, hypertension, hyperlipidemia, colon cancer s/p partial colectomy, kidney stones, history of endovenous laser ablation left greater saphenous who reports that he has suffers from increasing edema in his legs and feet and he has gained 25 pounds since October 2022.  He reports that he has had intermittent palpitations.  He also has increasing dyspnea symptoms but no chest pain symptoms.  He is a heavy longtime smoker and has not been able to quit.  He presented to ED after he had seen his PCP today and was noted to have an irregular heart rhythm and was desaturated to 82% on room air.    ED Course: Pt presented with atrial flutter with RVR on monitor and EKG.  He was massively volume overloaded weighing nearly 305 pounds.  He had been down to 280 about 3 months ago.  He was on 3L nasal cannula and his heart rate was 173.  He was given IV lasix and started on IV cardizem infusion.  His Hs troponin was 20.  His coronavirus and influenza testing was negative.  Admission was requested for further management.    Review of Systems: Review of Systems  Constitutional:  Positive for malaise/fatigue. Negative for chills, diaphoresis and fever.  HENT: Negative.    Eyes: Negative.   Respiratory:  Positive for cough, sputum production, shortness of breath and wheezing.   Cardiovascular:  Positive for palpitations, orthopnea, leg swelling and PND. Negative for chest pain and claudication.  Gastrointestinal:  Positive for heartburn. Negative for abdominal pain, diarrhea, nausea and vomiting.  Genitourinary:  Negative.   Musculoskeletal: Negative.   Skin:  Positive for rash.  Neurological: Negative.   Endo/Heme/Allergies: Negative.   Psychiatric/Behavioral: Negative.    All other systems reviewed and are negative.   Past Medical History:  Diagnosis Date   Colon cancer ([place]) [place] 2014   Hypertension    Renal disorder    Kidney stones    Past Surgical History:  Procedure Laterality Date   COLONOSCOPY N/A 04/29/2013   Procedure: COLONOSCOPY;  Surgeon: Danie Binder, MD;  Location: [facility];  Service: Endoscopy;  Laterality: N/A;  10:30AM   Cyst removed from left elbow     ENDOVENOUS ABLATION SAPHENOUS VEIN W/ LASER Left 01/16/2019   endovenous laser ablation left greater saphenous vein by Ruta Hinds MD   KIDNEY STONE SURGERY     LITHOTRIPSY     PARTIAL COLECTOMY       reports that he has been smoking cigarettes. He has a 20.00 pack-year smoking history. He has never used smokeless tobacco. He reports that he does not currently use alcohol. He reports that he does not use drugs.  Allergies  Allergen Reactions   Penicillins Other (See Comments)    Childhood Allergy  Has patient had a PCN reaction causing immediate rash, facial/tongue/throat swelling, SOB or lightheadedness with hypotension: No Has patient had a PCN reaction causing severe rash involving mucus membranes or skin necrosis: No Has patient had a PCN reaction that required hospitalization: No Has patient had a PCN reaction occurring within the last 10 years: No If all of  the above answers are "NO", then may proceed with Cephalosporin use.     Family History  Problem Relation Age of Onset   Cancer Mother        pancreatic   Colon cancer Neg Hx     Prior to Admission medications   Medication Sig Start Date End Date Taking? Authorizing Provider  albuterol (PROVENTIL HFA;VENTOLIN HFA) 108 (90 Base) MCG/ACT inhaler Inhale 2 puffs into the lungs every 6 (six) hours as needed for wheezing or shortness of breath.  02/02/18  Yes Tat, Shanon Brow, MD  atorvastatin (LIPITOR) 20 MG tablet Take 1 tablet by mouth daily. 01/19/18  Yes [provider]  Budeson-Glycopyrrol-Formoterol (BREZTRI AEROSPHERE) 160-9-4.8 MCG/ACT AERO Inhale 2 puffs into the lungs daily.   Yes [provider]  furosemide (LASIX) 20 MG tablet Take 1 tablet by mouth 2 (two) times daily as needed. 01/26/18  Yes [provider]  ibuprofen (ADVIL) 200 MG tablet Take 400 mg by mouth every 6 (six) hours as needed for headache or mild pain.   Yes [provider]  Multiple Vitamin (MULTI-VITAMIN DAILY PO) Take 1 tablet by mouth daily.   Yes [provider]  SYMBICORT 160-4.5 MCG/ACT inhaler Inhale 2 puffs into the lungs 2 (two) times daily. 04/21/21  Yes [provider]  aspirin EC 81 MG EC tablet Take 1 tablet (81 mg total) by mouth daily. Patient not taking: Reported on 05/31/2021 02/03/18   Orson Eva, MD    Physical Exam: Vitals:   05/31/21 0938 05/31/21 1000 05/31/21 1030 05/31/21 1100  BP:  111/86 (!) 114/91 126/82  Pulse: (!) 129 (!) 129 (!) 129 (!) 128  Resp: 17 (!) 24 (!) 25 (!) 27  Temp:      TempSrc:      SpO2: 93% 96% 98% 92%  Weight:      Height:        Constitutional: massively volume overloaded and in moderate distress not able to lie recumbent, cooperative Eyes: PERRL, lids and conjunctivae normal ENMT: Mucous membranes are moist. Posterior pharynx clear of any exudate or lesions.Normal dentition.  Neck: normal, supple, no masses, no thyromegaly Respiratory: clear to auscultation bilaterally, no wheezing, no crackles. Normal respiratory effort. No accessory muscle use.  Cardiovascular: normal s1, s2 sounds, no murmurs / rubs / gallops. No extremity edema. 2+ pedal pulses. No carotid bruits.  Abdomen: no tenderness, no masses palpated. No hepatosplenomegaly. Bowel sounds positive.  Musculoskeletal: no clubbing / cyanosis. No joint deformity upper and lower extremities. Good ROM, no  contractures. Normal muscle tone.  Skin: no rashes, lesions, ulcers. No induration Neurologic: CN 2-12 grossly intact. Sensation intact, DTR normal. Strength 5/5 in all 4.  Psychiatric: Normal judgment and insight. Alert and oriented x 3. Normal mood.   Labs on Admission: I have personally reviewed following labs and imaging studies  CBC: Recent Labs  Lab 05/31/21 0841  WBC 8.6  NEUTROABS 6.5  HGB 14.1  HCT 45.4  MCV 105.8*  PLT 195   Basic Metabolic Panel: Recent Labs  Lab 05/31/21 0841  NA 137  K 4.5  CL 102  CO2 28  GLUCOSE 158*  BUN 20  CREATININE 1.19  CALCIUM 8.8*  MG 1.8   GFR: Estimated Creatinine Clearance: 87.8 mL/min (by C-G formula based on SCr of 1.19 mg/dL). Liver Function Tests: Recent Labs  Lab 05/31/21 0841  AST 28  ALT 27  ALKPHOS 112  BILITOT 1.9*  PROT 7.0  ALBUMIN 3.8   No results for  input(s): LIPASE, AMYLASE in the last 168 hours. No results for input(s): AMMONIA in the last 168 hours. Coagulation Profile: No results for input(s): INR, PROTIME in the last 168 hours. Cardiac Enzymes: No results for input(s): CKTOTAL, CKMB, CKMBINDEX, TROPONINI in the last 168 hours. BNP (last 3 results) No results for input(s): PROBNP in the last 8760 hours. HbA1C: No results for input(s): HGBA1C in the last 72 hours. CBG: No results for input(s): GLUCAP in the last 168 hours. Lipid Profile: No results for input(s): CHOL, HDL, LDLCALC, TRIG, CHOLHDL, LDLDIRECT in the last 72 hours. Thyroid Function Tests: Recent Labs    05/31/21 0842  TSH 3.031   Anemia Panel: No results for input(s): VITAMINB12, FOLATE, FERRITIN, TIBC, IRON, RETICCTPCT in the last 72 hours. Urine analysis:    Component Value Date/Time   COLORURINE YELLOW 10/02/2008 1937   APPEARANCEUR CLEAR 10/02/2008 1937   LABSPEC 1.020 10/02/2008 1937   PHURINE 6.0 10/02/2008 1937   GLUCOSEU NEGATIVE 10/02/2008 1937   HGBUR NEGATIVE 10/02/2008 1937   BILIRUBINUR NEGATIVE 10/02/2008  1937   KETONESUR NEGATIVE 10/02/2008 1937   PROTEINUR NEGATIVE 10/02/2008 1937   UROBILINOGEN 0.2 10/02/2008 1937   NITRITE NEGATIVE 10/02/2008 1937   LEUKOCYTESUR  10/02/2008 1937    NEGATIVE MICROSCOPIC NOT DONE ON URINES WITH NEGATIVE PROTEIN, BLOOD, LEUKOCYTES, NITRITE, OR GLUCOSE <1000 mg/dL.    Radiological Exams on Admission: DG Chest Portable 1 View  Result Date: 05/31/2021 CLINICAL DATA:  sob EXAM: PORTABLE CHEST 1 VIEW COMPARISON:  01/31/2018. FINDINGS: Low lung volumes with mildly increased conspicuity of left basilar opacities. Suspected small left pleural effusion. No visible pneumothorax. Cardiomediastinal silhouette is accentuated by portable AP technique. IMPRESSION: Low lung volumes with mildly increased conspicuity of left basilar opacities, which could represent atelectasis or pneumonia. Suspected small left pleural effusion. Dedicated PA and lateral radiographs could further evaluate if clinically indicated. Electronically Signed   By: Margaretha Sheffield M.D.   On: 05/31/2021 08:58    EKG: Independently reviewed. Rapid atrial flutter with RVR   Assessment/Plan Principal Problem:   Atrial flutter with rapid ventricular response (HCC) Active Problems:   Tobacco abuse   COPD (chronic obstructive pulmonary disease) (HCC)   Hyperlipidemia   Coronary artery calcification seen on CT scan   Hypertension   History of Colon cancer   Acute heart failure (HCC)   Hyperglycemia   Elevated brain natriuretic peptide (BNP) level   Acute heart failure  - presents with massive volume overload - he is at least 20 pounds over baseline weight - he has pitting / weeping edema in legs and feet  - IV lasix started 40 mg every 12 hours, if poor response, increase dose - monitor weights, electrolytes, intake and output - hold off TTE until heart rate better controlled, hopeful for TTE in AM 1/10 - recheck BNP in AM. - 2 gm sodium diet - elevated legs - place TED hose if possible    Essential hypertension  - follow closely while on IV lasix and IV cardizem - BP holding for now  Atrial flutter with RVR - IV cardizem infusion started  - apixaban for full anticoagulation  - follow TSH and TTE  - CHAD2-VASc at least 4   Hyperlipidemia - add fasting lipid panel - resume home atorvastatin daily   Tobacco  - nicotine patch 21 mg daily ordered  - counseled on need for smoking cessation   Hyperglycemia  - check A1c  - SSI coverage if needed - goal BS 140-180 in  hospital   COPD - stop smoking - bronchodilators reordered  - counseled to stop smoking   DVT prophylaxis: apixaban   Code Status: full   Family Communication: wife at bedside updated   Disposition Plan: anticipate home   Consults called: cardio   Admission status: INP   Level of care: Stepdown Irwin Brakeman MD [facility] How to contact the [facility] Attending or Consulting provider [place] or covering provider during after hours [place], for this patient?  Check the care team in [facility] and look for a) attending/consulting [facility] provider listed and b) the [facility] team listed Log into [email or web address] and use [place]'s universal password to access. If you do not have the password, please contact the hospital operator. Locate the [facility] provider you are looking for under [facility] and page to a number that you can be directly reached. If you still have difficulty reaching the provider, please page the [facility] (Director on Call) for the Hospitalists listed on amion for assistance.   If 7PM-7AM, please contact night-coverage [email or web address] Password [facility]  05/31/2021, 11:25 AM

## 2021-05-31 NOTE — ED Provider Notes (Signed)
[facility] EMERGENCY DEPARTMENT Provider Note   CSN: [id number] Arrival date & time: 05/31/21  7846     History  Chief Complaint  Patient presents with   Palpitations    [name] is a 66 y.o. male.   Palpitations Associated symptoms: shortness of breath   Associated symptoms: no back pain and no weakness   Patient presents with shortness of breath.  Over the last 3 weeks has been seen by PCP.  Reportedly has been on fluid pills to help with the fluid off his legs.  Worsening swelling.  Reportedly is also about 20 pounds.  Now more shortness of breath.  States it does not seem as if the fluid pills are really working.  Has not been able to lay flat for a few days.  No fevers or chills.  Occasional cough.  Went to see the office today and room air sats in the 80s and a new irregular tachycardia.    Home Medications Prior to Admission medications   Medication Sig Start Date End Date Taking? Authorizing Provider  albuterol (PROVENTIL HFA;VENTOLIN HFA) 108 (90 Base) MCG/ACT inhaler Inhale 2 puffs into the lungs every 6 (six) hours as needed for wheezing or shortness of breath. 02/02/18  Yes Tat, Shanon Brow, MD  atorvastatin (LIPITOR) 20 MG tablet Take 1 tablet by mouth daily. 01/19/18  Yes [provider]  Budeson-Glycopyrrol-Formoterol (BREZTRI AEROSPHERE) 160-9-4.8 MCG/ACT AERO Inhale 2 puffs into the lungs daily.   Yes [provider]  furosemide (LASIX) 20 MG tablet Take 1 tablet by mouth 2 (two) times daily as needed. 01/26/18  Yes [provider]  ibuprofen (ADVIL) 200 MG tablet Take 400 mg by mouth every 6 (six) hours as needed for headache or mild pain.   Yes [provider]  Multiple Vitamin (MULTI-VITAMIN DAILY PO) Take 1 tablet by mouth daily.   Yes [provider]  SYMBICORT 160-4.5 MCG/ACT inhaler Inhale 2 puffs into the lungs 2 (two) times daily. 04/21/21  Yes [provider]  aspirin EC 81 MG EC tablet Take 1 tablet  (81 mg total) by mouth daily. Patient not taking: Reported on 05/31/2021 02/03/18   Orson Eva, MD      Allergies    Penicillins    Review of Systems   Review of Systems  Constitutional:  Negative for appetite change.  Respiratory:  Positive for shortness of breath.   Cardiovascular:  Positive for palpitations.  Gastrointestinal:  Negative for abdominal pain.  Musculoskeletal:  Negative for back pain.  Neurological:  Negative for weakness.  Psychiatric/Behavioral:  Negative for confusion.    Physical Exam Updated Vital Signs BP 111/86    Pulse (!) 129    Temp 98.5 F (36.9 C) (Oral)    Resp (!) 24    Ht 6' (1.829 m)    Wt (!) 137.9 kg    SpO2 96%    BMI 41.23 kg/m  Physical Exam Vitals and nursing note reviewed.  HENT:     Head: Normocephalic.  Cardiovascular:     Rate and Rhythm: Regular rhythm. Tachycardia present.  Pulmonary:     Breath sounds: Rales present.     Comments: Rales bilateral bases.  Mild dyspnea. Musculoskeletal:     Cervical back: Neck supple.     Right lower leg: Edema present.     Left lower leg: Edema present.     Comments: Moderate pitting edema some chronic changes bilateral lower extremities.  Skin:    Capillary Refill: Capillary refill  takes less than 2 seconds.  Neurological:     Mental Status: He is alert and oriented to person, place, and time.    ED Results / Procedures / Treatments   Labs (all labs ordered are listed, but only abnormal results are displayed) Labs Reviewed  COMPREHENSIVE METABOLIC PANEL - Abnormal; Notable for the following components:      Result Value   Glucose, Bld 158 (*)    Calcium 8.8 (*)    Total Bilirubin 1.9 (*)    All other components within normal limits  BRAIN NATRIURETIC PEPTIDE - Abnormal; Notable for the following components:   B Natriuretic Peptide 155.0 (*)    All other components within normal limits  CBC WITH DIFFERENTIAL/PLATELET - Abnormal; Notable for the following components:   MCV 105.8 (*)     All other components within normal limits  TROPONIN I (HIGH SENSITIVITY) - Abnormal; Notable for the following components:   Troponin I (High Sensitivity) 22 (*)    All other components within normal limits  RESP PANEL BY RT-PCR (FLU A&B, COVID) ARPGX2  TSH  MAGNESIUM  TROPONIN I (HIGH SENSITIVITY)    EKG EKG Interpretation  Date/Time:  Monday May 31 2021 09:17:49 EST Ventricular Rate:  159 PR Interval:  141 QRS Duration: 155 QT Interval:  361 QTC Calculation: 586 R Axis:   100 Text Interpretation: tachycardia, potentially flutter. Confirmed by Davonna Belling [phone]) on 05/31/2021 10:23:02 AM  Radiology DG Chest Portable 1 View  Result Date: 05/31/2021 CLINICAL DATA:  sob EXAM: PORTABLE CHEST 1 VIEW COMPARISON:  01/31/2018. FINDINGS: Low lung volumes with mildly increased conspicuity of left basilar opacities. Suspected small left pleural effusion. No visible pneumothorax. Cardiomediastinal silhouette is accentuated by portable AP technique. IMPRESSION: Low lung volumes with mildly increased conspicuity of left basilar opacities, which could represent atelectasis or pneumonia. Suspected small left pleural effusion. Dedicated PA and lateral radiographs could further evaluate if clinically indicated. Electronically Signed   By: Margaretha Sheffield M.D.   On: 05/31/2021 08:58    Procedures Procedures    Medications Ordered in ED Medications  diltiazem (CARDIZEM) 1 mg/mL load via infusion 10 mg (10 mg Intravenous Bolus from Bag 05/31/21 0932)    And  diltiazem (CARDIZEM) 125 mg in dextrose 5% 125 mL (1 mg/mL) infusion (10 mg/hr Intravenous Rate/Dose Change 05/31/21 1007)  furosemide (LASIX) injection 40 mg (has no administration in time range)    ED Course/ Medical Decision Making/ A&P                           Medical Decision Making Patient presents with shortness of breath.  Sent from PCPs office per reportedly has been treated for increasing edema.  Has swelling of both his  legs.  States his weight is up about 20 pounds.  Unrelieved with Lasix at home.  However also found to be in a tachycardia.  Unknown start time.  Appears to be potentially in atrial flutter that appears to have the waves more prominent in V1.  Started on Cardizem drip to help slow down.  Also given some IV Lasix.  X-ray done and overall reassuring.  Atelectasis versus pneumonia on x-ray.  Troponin mildly elevated likely secondary to rate.  BNP also elevated.  Will admit to hospitalist.    Amount and/or Complexity of Data Reviewed Independent Historian: spouse External Data Reviewed: notes. Labs: ordered. Decision-making details documented in ED Course. Radiology: ordered and independent interpretation performed. Decision-making details documented  in ED Course. ECG/medicine tests: ordered and independent interpretation performed. Decision-making details documented in ED Course.  Risk Prescription drug management.  Critical Care Total time providing critical care: 30-74 minutes          Final Clinical Impression(s) / ED Diagnoses Final diagnoses:  None    Rx / DC Orders ED Discharge Orders     None         Davonna Belling, MD 05/31/21 1024

## 2021-05-31 NOTE — Consult Note (Signed)
Cardiology Consultation:   Patient ID: [name] MRN: [id number]; DOB: [birth year]  Admit date: 05/31/2021 Date of Consult: 05/31/2021  PCP:  Cory Munch, PA-C   [facility] Cardiologist:  Carlyle Dolly, MD        Patient Profile:   [name] is a 66 y.o. male with a hx of HTN, obesity who is being seen 05/31/2021 for the evaluation of Aflutter with RVR and CHF at the request of Dr. Wynetta Emery.  History of Present Illness:   [name] is a 66 year old male patient with a hx of HTN, COPD who was seen in cardiology clinc  in 2019 for atypical chest pain and was found to have coronary calcification on CT scan.  Stress test  was normal and echo showed normal LV function.   Patient also  has history of COPD, ongoing tobacco abuse, hypertension, HLD, obesity.  The pt says over the past few months he has noticed increased SOB    Also has developed increased swelling in legs   This became severe over the past few wks The pt notes occasional mild chest pains but not frequent   He denies palpitations    Denies dizziness  No syncope    Presented to ED with severe LE edema   Tele with atrial flutter with RVR  Since arrival he has been given IV lasix    He has been placed on IV diltiazem and Eliquis   Echo ordered   Past Medical History:  Diagnosis Date   Colon cancer ([place]) [place] 2014   Hypertension    Renal disorder    Kidney stones    Past Surgical History:  Procedure Laterality Date   COLONOSCOPY N/A 04/29/2013   Procedure: COLONOSCOPY;  Surgeon: Danie Binder, MD;  Location: [facility];  Service: Endoscopy;  Laterality: N/A;  10:30AM   Cyst removed from left elbow     ENDOVENOUS ABLATION SAPHENOUS VEIN W/ LASER Left 01/16/2019   endovenous laser ablation left greater saphenous vein by Ruta Hinds MD   KIDNEY STONE SURGERY     LITHOTRIPSY     PARTIAL COLECTOMY       Home Medications:  Prior to Admission medications   Medication Sig Start  Date End Date Taking? Authorizing Provider  albuterol (PROVENTIL HFA;VENTOLIN HFA) 108 (90 Base) MCG/ACT inhaler Inhale 2 puffs into the lungs every 6 (six) hours as needed for wheezing or shortness of breath. 02/02/18  Yes Tat, Shanon Brow, MD  atorvastatin (LIPITOR) 20 MG tablet Take 1 tablet by mouth daily. 01/19/18  Yes [provider]  Budeson-Glycopyrrol-Formoterol (BREZTRI AEROSPHERE) 160-9-4.8 MCG/ACT AERO Inhale 2 puffs into the lungs daily.   Yes [provider]  furosemide (LASIX) 20 MG tablet Take 1 tablet by mouth 2 (two) times daily as needed. 01/26/18  Yes [provider]  ibuprofen (ADVIL) 200 MG tablet Take 400 mg by mouth every 6 (six) hours as needed for headache or mild pain.   Yes [provider]  Multiple Vitamin (MULTI-VITAMIN DAILY PO) Take 1 tablet by mouth daily.   Yes [provider]  SYMBICORT 160-4.5 MCG/ACT inhaler Inhale 2 puffs into the lungs 2 (two) times daily. 04/21/21  Yes [provider]  aspirin EC 81 MG EC tablet Take 1 tablet (81 mg total) by mouth daily. Patient not taking: Reported on 05/31/2021 02/03/18   Orson Eva, MD    Inpatient Medications: Scheduled Meds:  apixaban  5 mg Oral BID   arformoterol  15 mcg Nebulization BID   And   umeclidinium bromide  1 puff Inhalation Daily   atorvastatin  20 mg Oral QPM   budesonide (PULMICORT) nebulizer solution  0.25 mg Nebulization BID   [START ON 06/01/2021] Chlorhexidine Gluconate Cloth  6 each Topical Q0600   furosemide  40 mg Intravenous Q12H   insulin aspart  0-15 Units Subcutaneous TID WC   nicotine  21 mg Transdermal Daily   Continuous Infusions:  diltiazem (CARDIZEM) infusion 15 mg/hr (05/31/21 1315)   PRN Meds: acetaminophen **OR** acetaminophen, albuterol, bisacodyl, ondansetron **OR** ondansetron (ZOFRAN) IV, oxyCODONE, traZODone  Allergies:    Allergies  Allergen Reactions   Penicillins Other (See Comments)    Childhood Allergy  Has patient had a  PCN reaction causing immediate rash, facial/tongue/throat swelling, SOB or lightheadedness with hypotension: No Has patient had a PCN reaction causing severe rash involving mucus membranes or skin necrosis: No Has patient had a PCN reaction that required hospitalization: No Has patient had a PCN reaction occurring within the last 10 years: No If all of the above answers are "NO", then may proceed with Cephalosporin use.     Social History:   Social History   Socioeconomic History   Marital status: Married    Spouse name: Not on file   Number of children: Not on file   Years of education: Not on file   Highest education level: Not on file  Occupational History   Not on file  Tobacco Use   Smoking status: Every Day    Packs/day: 1.00    Years: 20.00    Pack years: 20.00    Types: Cigarettes   Smokeless tobacco: Never  Substance and Sexual Activity   Alcohol use: Not Currently    Comment: couple mixed drinks at night as of 05/31/21   Drug use: No   Sexual activity: Not on file  Other Topics Concern   Not on file  Social History Narrative   Not on file   Social Determinants of Health   Financial Resource Strain: Not on file  Food Insecurity: Not on file  Transportation Needs: Not on file  Physical Activity: Not on file  Stress: Not on file  Social Connections: Not on file  Intimate Partner Violence: Not on file    Family History:     Family History  Problem Relation Age of Onset   Cancer Mother        pancreatic   Colon cancer Neg Hx      ROS:  Please see the history of present illness.  Review of Systems  Constitutional: Negative.  HENT: Negative.    Cardiovascular:  Positive for dyspnea on exertion, irregular heartbeat, leg swelling and palpitations.  Respiratory: Negative.    Endocrine: Negative.   Hematologic/Lymphatic: Negative.   Musculoskeletal: Negative.   Gastrointestinal: Negative.   Genitourinary: Negative.   Neurological: Negative.    All  other ROS reviewed and negative.     Physical Exam/Data:   Vitals:   05/31/21 1415 05/31/21 1435 05/31/21 1459 05/31/21 1500  BP:  118/83  (!) 130/99  Pulse: (!) 131 (!) 128  87  Resp: 15 (!) 23  20  Temp:    (!) 97.5 F (36.4 C)  TempSrc:    Oral  SpO2: 98% 94%    Weight:   132.7 kg   Height:   6\' 1"  (1.854 m)     Intake/Output Summary (Last 24 hours) at 05/31/2021 1547 Last data filed at 05/31/2021 1435  Gross per 24 hour  Intake --  Output 2825 ml  Net -2825 ml   Last 3 Weights 05/31/2021 05/31/2021 01/23/2019  Weight (lbs) 292 lb 8.8 oz 303 lb 15.9 oz 271 lb 4.8 oz  Weight (kg) 132.7 kg 137.89 kg 123.061 kg     Body mass index is 38.6 kg/m.  General:  Morbidly obese 66 yo who appears SOB  HEENT: normal Neck: Neck is full  jVP is increased.   Vascular: No carotid bruits; Distal pulses 2+ bilaterally Cardiac:  Tachy  RRR   No significant murmurs   Lungs:  Moving air  Wheezes and rales bilaterally   Abd: Abd is distended   Not tense  Nontender   Ext: 2-3+edema Musculoskeletal:  No deformities, BUE and BLE strength normal and equal Skin: Erythema in legs  Neuro:  CNs 2-12 intact, no focal abnormalities noted Psych:  Normal affect   EKG:  The EKG was personally reviewed and demonstrates: Atypical atrial flutter  174 bpm   Telemetry:  Telemetry was personally reviewed and demonstrates:  Aflutter 159/m  Now 120s  Relevant CV Studies: NST 2019 Blood pressure demonstrated a hypertensive response to exercise. There was no ST segment deviation noted during stress. Defect 1: There is a medium defect of moderate severity present in the mid inferoseptal, mid inferior and apical inferior location. As regional wall motion appears grossly normal, this appears to be due to soft tissue attenuation/artifact. This is a low risk study. No ischemia or scar. Nuclear stress EF: 61%.  Echo 2019 Study Conclusions   - Left ventricle: The cavity size was normal. Wall thickness was    increased  in a pattern of mild LVH. Systolic function was normal.    The estimated ejection fraction was in the range of 60% to 65%.    Wall motion was normal; there were no regional wall motion    abnormalities. Left ventricular diastolic function parameters    were normal.  - Aortic valve: Mildly calcified annulus. Trileaflet; mildly    thickened leaflets. Valve area (VTI): 2.62 cm^2. Valve area    (Vmax): 1.99 cm^2.  - Mitral valve: Mildly calcified annulus. Normal thickness leaflets    .  - Technically difficult study. Echocontrast was used to enhance    visualization.   -------------------------------------------------------------------   Laboratory Data:  High Sensitivity Troponin:   Recent Labs  Lab 05/31/21 0841 05/31/21 1023  TROPONINIHS 22* 20*     Chemistry Recent Labs  Lab 05/31/21 0841  NA 137  K 4.5  CL 102  CO2 28  GLUCOSE 158*  BUN 20  CREATININE 1.19  CALCIUM 8.8*  MG 1.8  GFRNONAA >60  ANIONGAP 7    Recent Labs  Lab 05/31/21 0841  PROT 7.0  ALBUMIN 3.8  AST 28  ALT 27  ALKPHOS 112  BILITOT 1.9*   Lipids  Recent Labs  Lab 05/31/21 0841  CHOL 86  TRIG 52  HDL 41  LDLCALC 35  CHOLHDL 2.1    Hematology Recent Labs  Lab 05/31/21 0841  WBC 8.6  RBC 4.29  HGB 14.1  HCT 45.4  MCV 105.8*  MCH 32.9  MCHC 31.1  RDW 14.4  PLT 180   Thyroid  Recent Labs  Lab 05/31/21 0842  TSH 3.031    BNP Recent Labs  Lab 05/31/21 0841  BNP 155.0*    DDimer No results for input(s): DDIMER in the last 168 hours.   Radiology/Studies:  DG Chest Portable 1 View  Result  Date: 05/31/2021 CLINICAL DATA:  sob EXAM: PORTABLE CHEST 1 VIEW COMPARISON:  01/31/2018. FINDINGS: Low lung volumes with mildly increased conspicuity of left basilar opacities. Suspected small left pleural effusion. No visible pneumothorax. Cardiomediastinal silhouette is accentuated by portable AP technique. IMPRESSION: Low lung volumes with mildly increased conspicuity of left  basilar opacities, which could represent atelectasis or pneumonia. Suspected small left pleural effusion. Dedicated PA and lateral radiographs could further evaluate if clinically indicated. Electronically Signed   By: Margaretha Sheffield M.D.   On: 05/31/2021 08:58     Assessment and Plan:   1  Acute CHF   Pt with marked volume increase  At least 20 lb over normal wt    May be due to tachycardia      Recomm:   Continue diuresis with IV lsix   I would increase to 80 mg bid  Echo in am    Rate control for arrhythmia as BP tolerates  2  Atrial flutter    Pt with atypical atrial flutter   Rates uncontrolled    Continue IV diltiazem    Can add Digoxin.   Watch renal function closely  Metoprolol as BP tolerates    Not clear how long he has been in this rhythm   He does not sense    Plan to cardiovert when adequately anticoagulated with DCCV   Or, if rates cant be controlled will requre TEE / cardioversion   Would be good to improve volume status prior .    3HTN  BP currently low     4  CAD   Coronary calcifications on CT scan in 2019  GXT without ischemia    He is not having any signif CP   Follow.  Echo to eval LVEF/wall motion  (may reflect tachy changes )  5  HLD  Pt on lipitor at home   Continue    6    Tobacco abuse .   For questions or updates, please contact [place] Please consult [email or web address] for contact info under    Signed,  Dorris Carnes, MD

## 2021-06-01 ENCOUNTER — Other Ambulatory Visit (HOSPITAL_COMMUNITY): Payer: Self-pay

## 2021-06-01 ENCOUNTER — Inpatient Hospital Stay (HOSPITAL_COMMUNITY): Payer: Medicare HMO

## 2021-06-01 ENCOUNTER — Other Ambulatory Visit (HOSPITAL_COMMUNITY): Payer: Self-pay | Admitting: *Deleted

## 2021-06-01 DIAGNOSIS — J438 Other emphysema: Secondary | ICD-10-CM

## 2021-06-01 DIAGNOSIS — E119 Type 2 diabetes mellitus without complications: Secondary | ICD-10-CM

## 2021-06-01 DIAGNOSIS — I4892 Unspecified atrial flutter: Secondary | ICD-10-CM | POA: Diagnosis not present

## 2021-06-01 LAB — BASIC METABOLIC PANEL
Anion gap: 11 (ref 5–15)
BUN: 19 mg/dL (ref 8–23)
CO2: 30 mmol/L (ref 22–32)
Calcium: 9 mg/dL (ref 8.9–10.3)
Chloride: 97 mmol/L — ABNORMAL LOW (ref 98–111)
Creatinine, Ser: 0.93 mg/dL (ref 0.61–1.24)
GFR, Estimated: >60 mL/min (ref >60)
Glucose, Bld: 111 mg/dL — ABNORMAL HIGH (ref 70–99)
Potassium: 4.1 mmol/L (ref 3.5–5.1)
Sodium: 138 mmol/L (ref 135–145)

## 2021-06-01 LAB — ECHOCARDIOGRAM COMPLETE
AR max vel: 2.55 cm2
AV Area VTI: 2.47 cm2
AV Area mean vel: 2.33 cm2
AV Mean grad: 6 mmHg
AV Peak grad: 10.8 mmHg
Ao pk vel: 1.64 m/s
Area-P 1/2: 4.8 cm2
Height: 73 in
MV VTI: 4.1 cm2
S' Lateral: 3 cm
Weight: 4522.08 oz

## 2021-06-01 LAB — CBC
HCT: 42.7 % (ref 39.0–52.0)
Hemoglobin: 13.7 g/dL (ref 13.0–17.0)
MCH: 33.9 pg (ref 26.0–34.0)
MCHC: 32.1 g/dL (ref 30.0–36.0)
MCV: 105.7 fL — ABNORMAL HIGH (ref 80.0–100.0)
Platelets: 185 10*3/uL (ref 150–400)
RBC: 4.04 MIL/uL — ABNORMAL LOW (ref 4.22–5.81)
RDW: 14.4 % (ref 11.5–15.5)
WBC: 9.3 10*3/uL (ref 4.0–10.5)
nRBC: 0 % (ref 0.0–0.2)

## 2021-06-01 LAB — GLUCOSE, CAPILLARY
Glucose-Capillary: 117 mg/dL — ABNORMAL HIGH (ref 70–99)
Glucose-Capillary: 174 mg/dL — ABNORMAL HIGH (ref 70–99)
Glucose-Capillary: 138 mg/dL — ABNORMAL HIGH (ref 70–99)
Glucose-Capillary: 185 mg/dL — ABNORMAL HIGH (ref 70–99)

## 2021-06-01 LAB — MRSA NEXT GEN BY PCR, NASAL: MRSA by PCR Next Gen: NOT DETECTED

## 2021-06-01 LAB — BRAIN NATRIURETIC PEPTIDE: B Natriuretic Peptide: 147 pg/mL — ABNORMAL HIGH (ref 0.0–100.0)

## 2021-06-01 LAB — MAGNESIUM: Magnesium: 1.7 mg/dL (ref 1.7–2.4)

## 2021-06-01 MED ORDER — MAGNESIUM SULFATE 4 GM/100ML IV SOLN
4.0000 g | Freq: Once | INTRAVENOUS | Status: AC
  Administered 2021-06-01: 4 g via INTRAVENOUS
  Filled 2021-06-01: qty 100

## 2021-06-01 MED ORDER — PERFLUTREN LIPID MICROSPHERE
1.0000 mL | INTRAVENOUS | Status: AC | PRN
  Administered 2021-06-01: 6 mL via INTRAVENOUS

## 2021-06-01 NOTE — TOC Benefit Eligibility Note (Signed)
Patient Teacher, English as a foreign language completed.    The patient is currently admitted and upon discharge could be taking Eliquis 5 mg.  The current 30 day co-pay is, $47.00.   The patient is insured through [place] Patient Advocate Specialist [place] Patient Advocate Team Direct Number: [phone]  Fax: [phone]

## 2021-06-01 NOTE — Progress Notes (Signed)
*  PRELIMINARY RESULTS* Echocardiogram 2D Echocardiogram has been performed.  [name] 06/01/2021, 9:45 AM

## 2021-06-01 NOTE — Progress Notes (Signed)
PROGRESS NOTE   [name]  ACZ:[id number] DOB: [birth year] DOA: 05/31/2021 PCP: Cory Munch, PA-C   Chief Complaint  Patient presents with   Palpitations   Level of care: Stepdown  Brief Admission History:   66 y.o. male with medical history significant chronic smoking, hypertension, hyperlipidemia, colon cancer s/p partial colectomy, kidney stones, history of endovenous laser ablation left greater saphenous who reports that he has suffers from increasing edema in his legs and feet and he has gained 25 pounds since October 2022.  He reports that he has had intermittent palpitations.  He also has increasing dyspnea symptoms but no chest pain symptoms.  He is a heavy longtime smoker and has not been able to quit.  He presented to ED after he had seen his PCP today and was noted to have an irregular heart rhythm and was desaturated to 82% on room air.     ED Course: Pt presented with atrial flutter with RVR on monitor and EKG.  He was massively volume overloaded weighing nearly 305 pounds.  He had been down to 280 about 3 months ago.  He was on 3L nasal cannula and his heart rate was 173.  He was given IV lasix and started on IV cardizem infusion.  His Hs troponin was 20.  His coronavirus and influenza testing was negative.  Admission was requested for further management.    Assessment & Plan:   Principal Problem:   Atrial flutter with rapid ventricular response (HCC) Active Problems:   Tobacco abuse   COPD (chronic obstructive pulmonary disease) (HCC)   Hyperlipidemia   Coronary artery calcification seen on CT scan   Hypertension   History of Colon cancer   Acute heart failure (HCC)   Hyperglycemia   Elevated brain natriuretic peptide (BNP) level   Acute heart failure  - presents with massive volume overload - he is at least 20 pounds over baseline weight - he has pitting / weeping edema in legs and feet  - IV lasix 80 mg every 12 hours - monitor weights, electrolytes,  intake and output - TTE pending  - follow BMP - 2 gm sodium diet - elevated legs - continue TED hose    Essential hypertension  - follow closely while on IV lasix and IV cardizem - BP stable, follow   Atrial flutter with RVR - IV cardizem infusion started  - apixaban for full anticoagulation  - TSH within normal limits and TTE pending - CHAD2-VASc at least 4  - cardiology team planning for TEE cardioversion possibly 1/11    Hyperlipidemia - lipid panel optimally controlled - continue atorvastatin    Tobacco  - nicotine patch 21 mg daily ordered  - counseled on need for smoking cessation    Type 2 diabetes mellitus - apparent new diagnosis  - A1c - 7.5%  - he will need outpatient referral to diabetes education and nutrition   - SSI coverage if needed - goal BS 140-180 in hospital, no BS >180 at this time - DC home on glucophage XR (metformin ER) to titrate up to 2 gm daily   COPD - stop smoking - bronchodilators reordered  - counseled to stop smoking  - nicotine patch ordered but declined   DVT prophylaxis: apixaban  Code Status: Full  Family Communication: wife bedside update 1/9 Disposition: anticipate home  Status is: Inpatient  Remains inpatient appropriate because: IV lasix required, TEE cardioversion planned for 1/11  Consultants:  Cardiology   Procedures:  Antimicrobials:  N/a   Subjective: Pt reports feeling better and urinating frequently having less edema in legs, no chest pain, much less SOB.    Objective: Vitals:   06/01/21 0825 06/01/21 0900 06/01/21 1000 06/01/21 1113  BP:  104/90 120/69   Pulse:  (!) 127 (!) 127 (!) 127  Resp:  (!) 21 (!) 21 (!) 21  Temp:    98.1 F (36.7 C)  TempSrc:    Oral  SpO2: 96% 91% 90% 92%  Weight:      Height:        Intake/Output Summary (Last 24 hours) at 06/01/2021 1121 Last data filed at 06/01/2021 1058 Gross per 24 hour  Intake 540.74 ml  Output 8350 ml  Net -7809.26 ml   Filed Weights    05/31/21 0822 05/31/21 1459 06/01/21 0500  Weight: (!) 137.9 kg 132.7 kg 128.2 kg    Examination:  General exam: obese male, awake, alert, cooperative, Appears calm and comfortable  Respiratory system: bibasilar crackles. Respiratory effort normal. Cardiovascular system: tachycardic rate, irregular, normal S1 & S2 heard. 1+ JVD, No murmurs, rubs, gallops or clicks.  2+ pedal edema. Gastrointestinal system: Abdomen is nondistended, soft and nontender. No organomegaly or masses felt. Normal bowel sounds heard. Central nervous system: Alert and oriented. No focal neurological deficits. Extremities: 2+ pitting edema BLEs. Skin: No rashes, lesions or ulcers Psychiatry: Judgement and insight appear normal. Mood & affect appropriate.   Data Reviewed: I have personally reviewed following labs and imaging studies  CBC: Recent Labs  Lab 05/31/21 0841 06/01/21 0401  WBC 8.6 9.3  NEUTROABS 6.5  --   HGB 14.1 13.7  HCT 45.4 42.7  MCV 105.8* 105.7*  PLT 180 378    Basic Metabolic Panel: Recent Labs  Lab 05/31/21 0841 06/01/21 0401  NA 137 138  K 4.5 4.1  CL 102 97*  CO2 28 30  GLUCOSE 158* 111*  BUN 20 19  CREATININE 1.19 0.93  CALCIUM 8.8* 9.0  MG 1.8 1.7    GFR: Estimated Creatinine Clearance: 109.6 mL/min (by C-G formula based on SCr of 0.93 mg/dL).  Liver Function Tests: Recent Labs  Lab 05/31/21 0841  AST 28  ALT 27  ALKPHOS 112  BILITOT 1.9*  PROT 7.0  ALBUMIN 3.8    CBG: Recent Labs  Lab 05/31/21 1138 05/31/21 1218 05/31/21 1617 05/31/21 2107 06/01/21 0720  GLUCAP 112* 108* 107* 124* 117*    Recent Results (from the past 240 hour(s))  Resp Panel by RT-PCR (Flu A&B, Covid) Nasopharyngeal Swab     Status: None   Collection Time: 05/31/21  8:57 AM   Specimen: Nasopharyngeal Swab; Nasopharyngeal(NP) swabs in vial transport medium  Result Value Ref Range Status   SARS Coronavirus 2 by RT PCR NEGATIVE NEGATIVE Final    Comment: (NOTE) SARS-CoV-2  target nucleic acids are NOT DETECTED.  The SARS-CoV-2 RNA is generally detectable in upper respiratory specimens during the acute phase of infection. The lowest concentration of SARS-CoV-2 viral copies this assay can detect is 138 copies/mL. A negative result does not preclude SARS-Cov-2 infection and should not be used as the sole basis for treatment or other patient management decisions. A negative result may occur with  improper specimen collection/handling, submission of specimen other than nasopharyngeal swab, presence of viral mutation(s) within the areas targeted by this assay, and inadequate number of viral copies(<138 copies/mL). A negative result must be combined with clinical observations, patient history, and epidemiological information. The expected result is Negative.  Fact Sheet for Patients:  EntrepreneurPulse.com.au  Fact Sheet for Healthcare Providers:  IncredibleEmployment.be  This test is no t yet approved or cleared by the [place] FDA and  has been authorized for detection and/or diagnosis of SARS-CoV-2 by FDA under an Emergency Use Authorization (EUA). This EUA will remain  in effect (meaning this test can be used) for the duration of the COVID-19 declaration under Section 564(b)(1) of the Act, 21 U.S.C.section 360bbb-3(b)(1), unless the authorization is terminated  or revoked sooner.       Influenza A by PCR NEGATIVE NEGATIVE Final   Influenza B by PCR NEGATIVE NEGATIVE Final    Comment: (NOTE) The Xpert Xpress SARS-CoV-2/FLU/RSV plus assay is intended as an aid in the diagnosis of influenza from Nasopharyngeal swab specimens and should not be used as a sole basis for treatment. Nasal washings and aspirates are unacceptable for Xpert Xpress SARS-CoV-2/FLU/RSV testing.  Fact Sheet for Patients: EntrepreneurPulse.com.au  Fact Sheet for Healthcare  Providers: IncredibleEmployment.be  This test is not yet approved or cleared by the [place] FDA and has been authorized for detection and/or diagnosis of SARS-CoV-2 by FDA under an Emergency Use Authorization (EUA). This EUA will remain in effect (meaning this test can be used) for the duration of the COVID-19 declaration under Section 564(b)(1) of the Act, 21 U.S.C. section 360bbb-3(b)(1), unless the authorization is terminated or revoked.  Performed at [facility], [address], [place], [place] [address]   MRSA Next Gen by PCR, Nasal     Status: None   Collection Time: 05/31/21  3:00 PM   Specimen: Nasal Mucosa; Nasal Swab  Result Value Ref Range Status   MRSA by PCR Next Gen NOT DETECTED NOT DETECTED Final    Comment: (NOTE) The GeneXpert MRSA Assay (FDA approved for NASAL specimens only), is one component of a comprehensive MRSA colonization surveillance program. It is not intended to diagnose MRSA infection nor to guide or monitor treatment for MRSA infections. Test performance is not FDA approved in patients less than 12 years old. Performed at [facility], [address], [place], [place] [address]      Radiology Studies: DG Chest Portable 1 View  Result Date: 05/31/2021 CLINICAL DATA:  sob EXAM: PORTABLE CHEST 1 VIEW COMPARISON:  01/31/2018. FINDINGS: Low lung volumes with mildly increased conspicuity of left basilar opacities. Suspected small left pleural effusion. No visible pneumothorax. Cardiomediastinal silhouette is accentuated by portable AP technique. IMPRESSION: Low lung volumes with mildly increased conspicuity of left basilar opacities, which could represent atelectasis or pneumonia. Suspected small left pleural effusion. Dedicated PA and lateral radiographs could further evaluate if clinically indicated. Electronically Signed   By: Margaretha Sheffield M.D.   On: 05/31/2021 08:58   ECHOCARDIOGRAM COMPLETE  Result Date: 06/01/2021     ECHOCARDIOGRAM REPORT   Patient Name:   [name] Date of Exam: 06/01/2021 Medical Rec #:  [id number]          Height:       73.0 in Accession #:    [id number]         Weight:       282.6 lb Date of Birth:  Jun 30, 1955           BSA:          2.492 m Patient Age:    76 years           BP:           108/77 mmHg Patient Gender: M  HR:           127 bpm. Exam Location:  [facility] Procedure: 2D Echo, Cardiac Doppler, Color Doppler and Intracardiac            Opacification Agent Indications:    AFlutter  History:        Patient has prior history of Echocardiogram examinations, most                 recent 02/01/2018. CHF, CAD, COPD, Arrythmias:Atrial Flutter;                 Risk Factors:Hypertension, Dyslipidemia and Current Smoker.  Sonographer:    Wenda Low Referring Phys: [place]  1. Left ventricular ejection fraction, by estimation, is 60 to 65%. The left ventricle has normal function. The left ventricle has no regional wall motion abnormalities. There is mild left ventricular hypertrophy. Left ventricular diastolic parameters are indeterminate. There is the interventricular septum is flattened in systole and diastole, consistent with right ventricular pressure and volume overload.  2. Right ventricular systolic function is mildly reduced. The right ventricular size is mildly enlarged. There is moderately elevated pulmonary artery systolic pressure. The estimated right ventricular systolic pressure is 59.7 mmHg.  3. Left atrial size was mildly dilated.  4. Right atrial size was mildly dilated.  5. The mitral valve is grossly normal. Trivial mitral valve regurgitation.  6. Tricuspid valve regurgitation is moderate.  7. The aortic valve is tricuspid. There is moderate calcification of the aortic valve. Aortic valve regurgitation is not visualized. Aortic valve sclerosis/calcification is present, without any evidence of aortic stenosis. Aortic valve mean gradient  measures 6.0 mmHg.  8. Aortic dilatation noted. There is mild dilatation of the aortic root, measuring 41 mm.  9. The inferior vena cava is dilated in size with <50% respiratory variability, suggesting right atrial pressure of 15 mmHg. Comparison(s): Prior images reviewed side by side. LVEF remains normal range. There is mild RV dysfunction with evidence of moderate pulmonary hypertension. FINDINGS  Left Ventricle: Left ventricular ejection fraction, by estimation, is 60 to 65%. The left ventricle has normal function. The left ventricle has no regional wall motion abnormalities. Definity contrast agent was given IV to delineate the left ventricular  endocardial borders. The left ventricular internal cavity size was normal in size. There is mild left ventricular hypertrophy. The interventricular septum is flattened in systole and diastole, consistent with right ventricular pressure and volume overload. Left ventricular diastolic parameters are indeterminate. Right Ventricle: The right ventricular size is mildly enlarged. No increase in right ventricular wall thickness. Right ventricular systolic function is mildly reduced. There is moderately elevated pulmonary artery systolic pressure. The tricuspid regurgitant velocity is 2.84 m/s, and with an assumed right atrial pressure of 15 mmHg, the estimated right ventricular systolic pressure is 41.6 mmHg. Left Atrium: Left atrial size was mildly dilated. Right Atrium: Right atrial size was mildly dilated. Pericardium: There is no evidence of pericardial effusion. Presence of epicardial fat layer. Mitral Valve: The mitral valve is grossly normal. Mild mitral annular calcification. Trivial mitral valve regurgitation. MV peak gradient, 4.6 mmHg. The mean mitral valve gradient is 3.0 mmHg. Tricuspid Valve: The tricuspid valve is grossly normal. Tricuspid valve regurgitation is moderate. Aortic Valve: The aortic valve is tricuspid. There is moderate calcification of the aortic  valve. There is mild aortic valve annular calcification. Aortic valve regurgitation is not visualized. Aortic valve sclerosis/calcification is present, without any  evidence of aortic stenosis. Aortic valve mean  gradient measures 6.0 mmHg. Aortic valve peak gradient measures 10.8 mmHg. Aortic valve area, by VTI measures 2.47 cm. Pulmonic Valve: The pulmonic valve was grossly normal. Pulmonic valve regurgitation is trivial. Aorta: Aortic dilatation noted. There is mild dilatation of the aortic root, measuring 41 mm. Venous: The inferior vena cava is dilated in size with less than 50% respiratory variability, suggesting right atrial pressure of 15 mmHg. IAS/Shunts: No atrial level shunt detected by color flow Doppler.  LEFT VENTRICLE PLAX 2D LVIDd:         5.00 cm   Diastology LVIDs:         3.00 cm   LV e' medial:    10.20 cm/s LV PW:         1.20 cm   LV E/e' medial:  9.7 LV IVS:        1.20 cm   LV e' lateral:   11.20 cm/s LVOT diam:     2.00 cm   LV E/e' lateral: 8.9 LV SV:         63 LV SV Index:   25 LVOT Area:     3.14 cm  RIGHT VENTRICLE RV Basal diam:  3.95 cm RV Mid diam:    3.10 cm LEFT ATRIUM             Index        RIGHT ATRIUM           Index LA diam:        4.50 cm 1.81 cm/m   RA Area:     25.20 cm LA Vol (A2C):   80.9 ml 32.46 ml/m  RA Volume:   78.20 ml  31.38 ml/m LA Vol (A4C):   97.7 ml 39.20 ml/m LA Biplane Vol: 89.3 ml 35.83 ml/m  AORTIC VALVE                     PULMONIC VALVE AV Area (Vmax):    2.55 cm      PV Vmax:       0.80 m/s AV Area (Vmean):   2.33 cm      PV Peak grad:  2.6 mmHg AV Area (VTI):     2.47 cm AV Vmax:           164.00 cm/s AV Vmean:          109.000 cm/s AV VTI:            0.256 m AV Peak Grad:      10.8 mmHg AV Mean Grad:      6.0 mmHg LVOT Vmax:         133.00 cm/s LVOT Vmean:        80.700 cm/s LVOT VTI:          0.201 m LVOT/AV VTI ratio: 0.79  AORTA Ao Root diam: 3.70 cm Ao Asc diam:  3.40 cm MITRAL VALVE               TRICUSPID VALVE MV Area (PHT): 4.80 cm     TR Peak grad:   32.3 mmHg MV Area VTI:   4.10 cm    TR Vmax:        284.00 cm/s MV Peak grad:  4.6 mmHg MV Mean grad:  3.0 mmHg    SHUNTS MV Vmax:       1.07 m/s    Systemic VTI:  0.20 m MV Vmean:      74.4 cm/s   Systemic Diam: 2.00  cm MV Decel Time: 158 msec MV E velocity: 99.40 cm/s Rozann Lesches MD Electronically signed by Rozann Lesches MD Signature Date/Time: 06/01/2021/11:17:43 AM    Final     Scheduled Meds:  apixaban  5 mg Oral BID   arformoterol  15 mcg Nebulization BID   And   umeclidinium bromide  1 puff Inhalation Daily   atorvastatin  20 mg Oral QPM   budesonide (PULMICORT) nebulizer solution  0.25 mg Nebulization BID   Chlorhexidine Gluconate Cloth  6 each Topical Q0600   digoxin  0.25 mg Oral Daily   furosemide  80 mg Intravenous Q12H   insulin aspart  0-15 Units Subcutaneous TID WC   mouth rinse  15 mL Mouth Rinse BID   nicotine  21 mg Transdermal Daily   potassium chloride  10 mEq Oral BID   Continuous Infusions:  diltiazem (CARDIZEM) infusion 15 mg/hr (06/01/21 1101)    LOS: 1 day   Critical Care Procedure Note Authorized and Performed by: Murvin Natal MD  Total Critical Care time:  38 mins  Due to a high probability of clinically significant, life threatening deterioration, the patient required my highest level of preparedness to intervene emergently and I personally spent this critical care time directly and personally managing the patient.  This critical care time included obtaining a history; examining the patient, pulse oximetry; ordering and review of studies; arranging urgent treatment with development of a management plan; evaluation of patient's response of treatment; frequent reassessment; and discussions with other providers.  This critical care time was performed to assess and manage the high probability of imminent and life threatening deterioration that could result in multi-organ failure.  It was exclusive of separately billable procedures and treating  other patients and teaching time.    Irwin Brakeman, MD How to contact the [facility] Attending or Consulting provider [place] or covering provider during after hours [place], for this patient?  Check the care team in [facility] and look for a) attending/consulting [facility] provider listed and b) the [facility] team listed Log into [email or web address] and use [place]'s universal password to access. If you do not have the password, please contact the hospital operator. Locate the [facility] provider you are looking for under [facility] and page to a number that you can be directly reached. If you still have difficulty reaching the provider, please page the [facility] (Director on Call) for the Hospitalists listed on amion for assistance.  06/01/2021, 11:21 AM

## 2021-06-01 NOTE — Progress Notes (Signed)
Progress Note  Patient Name: [name] Date of Encounter: 06/01/2021  Primary Cardiologist: Carlyle Dolly, MD  Subjective   Feels more comfortable today.  Abdomen less distended and leg swelling gradually improving.  No breathlessness at rest.  Did use CPAP overnight.  Inpatient Medications    Scheduled Meds:  apixaban  5 mg Oral BID   arformoterol  15 mcg Nebulization BID   And   umeclidinium bromide  1 puff Inhalation Daily   atorvastatin  20 mg Oral QPM   budesonide (PULMICORT) nebulizer solution  0.25 mg Nebulization BID   Chlorhexidine Gluconate Cloth  6 each Topical Q0600   digoxin  0.25 mg Oral Daily   furosemide  80 mg Intravenous Q12H   insulin aspart  0-15 Units Subcutaneous TID WC   mouth rinse  15 mL Mouth Rinse BID   nicotine  21 mg Transdermal Daily   potassium chloride  10 mEq Oral BID   Continuous Infusions:  diltiazem (CARDIZEM) infusion 15 mg/hr (06/01/21 0729)   magnesium sulfate bolus IVPB     PRN Meds: acetaminophen **OR** acetaminophen, albuterol, bisacodyl, ondansetron **OR** ondansetron (ZOFRAN) IV, oxyCODONE, traZODone   Vital Signs    Vitals:   06/01/21 0718 06/01/21 0800 06/01/21 0812 06/01/21 0814  BP:  104/68    Pulse: (!) 127 (!) 128    Resp: 20     Temp: 98.4 F (36.9 C)     TempSrc: Axillary     SpO2: 91%  (!) 89% 93%  Weight:      Height:        Intake/Output Summary (Last 24 hours) at 06/01/2021 0838 Last data filed at 06/01/2021 0729 Gross per 24 hour  Intake 507.28 ml  Output 6425 ml  Net -5917.72 ml   Filed Weights   05/31/21 0822 05/31/21 1459 06/01/21 0500  Weight: (!) 137.9 kg 132.7 kg 128.2 kg    Telemetry    Atrial flutter with 2:1 block.  Personally reviewed.  ECG    An ECG dated 06/01/2021 was personally reviewed today and demonstrated:  Atypical atrial flutter with 2:1 block and incomplete right bundle branch block.  Physical Exam   GEN: Obese male.  No acute distress.   Neck: No  JVD. Cardiac: Rapid regular rhythm, no murmur, rub, or gallop.  Respiratory: Nonlabored.  Decreased breath sounds left base, no egophony.Marland Kitchen GI: Soft, nontender, bowel sounds present. MS: 2+ pitting edema with stasis. Neuro:  Nonfocal. Psych: Alert and oriented x 3. Normal affect.  Labs    Chemistry Recent Labs  Lab 05/31/21 0841 06/01/21 0401  NA 137 138  K 4.5 4.1  CL 102 97*  CO2 28 30  GLUCOSE 158* 111*  BUN 20 19  CREATININE 1.19 0.93  CALCIUM 8.8* 9.0  PROT 7.0  --   ALBUMIN 3.8  --   AST 28  --   ALT 27  --   ALKPHOS 112  --   BILITOT 1.9*  --   GFRNONAA >60 >60  ANIONGAP 7 11     Hematology Recent Labs  Lab 05/31/21 0841 06/01/21 0401  WBC 8.6 9.3  RBC 4.29 4.04*  HGB 14.1 13.7  HCT 45.4 42.7  MCV 105.8* 105.7*  MCH 32.9 33.9  MCHC 31.1 32.1  RDW 14.4 14.4  PLT 180 185    Cardiac Enzymes Recent Labs  Lab 05/31/21 0841 05/31/21 1023  TROPONINIHS 22* 20*    BNP Recent Labs  Lab 05/31/21 0841 06/01/21 0401  BNP 155.0*  147.0*     Radiology    DG Chest Portable 1 View  Result Date: 05/31/2021 CLINICAL DATA:  sob EXAM: PORTABLE CHEST 1 VIEW COMPARISON:  01/31/2018. FINDINGS: Low lung volumes with mildly increased conspicuity of left basilar opacities. Suspected small left pleural effusion. No visible pneumothorax. Cardiomediastinal silhouette is accentuated by portable AP technique. IMPRESSION: Low lung volumes with mildly increased conspicuity of left basilar opacities, which could represent atelectasis or pneumonia. Suspected small left pleural effusion. Dedicated PA and lateral radiographs could further evaluate if clinically indicated. Electronically Signed   By: Margaretha Sheffield M.D.   On: 05/31/2021 08:58    Cardiac Studies   Echocardiogram pending.  Assessment & Plan    1.  Persistent, atypical atrial flutter with RVR, CHA2DS2-VASc score of 4.  Duration uncertain but potentially over the last few weeks at least with associated fluid  overload and weight gain.  Started on Eliquis, also IV diltiazem and Lanoxin with heart rates in the 120s, otherwise hemodynamically stable.  2.  Acute heart failure, systolic versus diastolic with echocardiogram pending.  At least 20 pound weight gain over baseline in the last few weeks.  Responding very well to IV Lasix, approximately 6 L out last 24 hours.  3.  Essential hypertension.  4.  Coronary artery calcification by CT imaging.  No ischemia by Myoview in 2019.  No recent angina symptoms.  High-sensitivity troponin I levels are flat and in the 20s, not suggestive of ACS.  I reviewed cardiology consultation note from Dr. Harrington Challenger.  Patient responding well to IV diuresis with symptomatic improvement.  Follow-up echocardiogram.  Unlikely to gain adequate rate control of atrial flutter, would continue current diltiazem and Lanoxin doses (hold off on adding amiodarone or pushing AV nodal blockers higher at this time).  Tentatively plan on TEE guided cardioversion tomorrow presuming he continues to diurese and show symptomatic improvement.  Getting him back in sinus rhythm will be the most helpful in terms of further clinical stabilization.  He is at risk for a tachycardia-mediated cardiomyopathy as well.  Signed, Rozann Lesches, MD  06/01/2021, 8:38 AM

## 2021-06-01 NOTE — TOC Initial Note (Signed)
Transition of Care [facility]) - Initial/Assessment Note    Patient Details  Name: [name] MRN: [id number] Date of Birth: [birth year]  Transition of Care [facility]) CM/SW Contact:    Boneta Lucks, RN Phone Number: 06/01/2021, 11:57 AM  Clinical Narrative:    Patient admitted with atrial Flutter. TOC consulted for CHF/HH screen. Patient is married, drives himself to appointments, walks independently performs his ADL's without assistance. Patient is agreeable to [facility] for education. Sarah with [facility] accepted the referral.         Expected Discharge Plan: [place] Barriers to Discharge: Continued Medical Work up   Patient Goals and CMS Choice Patient states their goals for this hospitalization and ongoing recovery are:: to go home. CMS Medicare.gov Compare Post Acute Care list provided to:: Patient Choice offered to / list presented to : Patient  Expected Discharge Plan and Services Expected Discharge Plan: [place] Choice: [place] arrangements for the past 2 months: Single Family Home         HH Arranged: RN [place] Agency: [place] Date [place]: 06/01/21 Time [place]: [place] Representative spoke with at [place]: Billey Chang  Prior Living Arrangements/Services Living arrangements for the past 2 months: [place] with:: Spouse   Do you feel safe going back to the place where you live?: Yes        Activities of Daily Living Home Assistive Devices/Equipment: CBG Meter ADL Screening (condition at time of admission) Patient's cognitive ability adequate to safely complete daily activities?: Yes Is the patient deaf or have difficulty hearing?: No Does the patient have difficulty seeing, even when wearing glasses/contacts?: No Does the patient have difficulty concentrating, remembering, or making decisions?: Yes Patient able to express need for assistance  with ADLs?: Yes Does the patient have difficulty dressing or bathing?: No Independently performs ADLs?: Yes (appropriate for developmental age) Does the patient have difficulty walking or climbing stairs?: No Weakness of Legs: None Weakness of Arms/Hands: None  Permission Sought/Granted      Emotional Assessment    Affect (typically observed): Accepting Orientation: : Oriented to Self, Oriented to Place, Oriented to  Time, Oriented to Situation Alcohol / Substance Use: Not Applicable Psych Involvement: No (comment)  Admission diagnosis:  Atrial flutter with rapid ventricular response (HCC) [I48.92] Patient Active Problem List   Diagnosis Date Noted   Type 2 diabetes mellitus ([place]) 06/01/2021   Atrial flutter with rapid ventricular response ([place]) 05/31/2021   Acute heart failure ([place]) 05/31/2021   Hyperglycemia 05/31/2021   Elevated brain natriuretic peptide (BNP) level 05/31/2021   Hypertension    Coronary artery calcification seen on CT scan 02/02/2018   Chest pain 02/01/2018   Tobacco abuse 02/01/2018   COPD (chronic obstructive pulmonary disease) ([place]) 02/01/2018   Hyperlipidemia 02/01/2018   History of Colon cancer 04/2013   PCP:  Cory Munch, PA-C Pharmacy:   [facility] [place], [place] AT [place] [address] [place] [address] Phone: [phone] Fax: [phone]  Readmission Risk Interventions Readmission Risk Prevention Plan 06/01/2021  Post Dischage Appt Complete  Medication Screening Complete  Transportation Screening Complete  Some recent data might be hidden

## 2021-06-02 ENCOUNTER — Other Ambulatory Visit (HOSPITAL_COMMUNITY): Payer: Medicare HMO

## 2021-06-02 ENCOUNTER — Other Ambulatory Visit (HOSPITAL_COMMUNITY): Payer: Self-pay | Admitting: *Deleted

## 2021-06-02 DIAGNOSIS — I272 Pulmonary hypertension, unspecified: Secondary | ICD-10-CM | POA: Diagnosis present

## 2021-06-02 DIAGNOSIS — I5033 Acute on chronic diastolic (congestive) heart failure: Secondary | ICD-10-CM | POA: Diagnosis present

## 2021-06-02 LAB — BASIC METABOLIC PANEL
Anion gap: 9 (ref 5–15)
BUN: 16 mg/dL (ref 8–23)
CO2: 35 mmol/L — ABNORMAL HIGH (ref 22–32)
Calcium: 8.5 mg/dL — ABNORMAL LOW (ref 8.9–10.3)
Chloride: 94 mmol/L — ABNORMAL LOW (ref 98–111)
Creatinine, Ser: 0.96 mg/dL (ref 0.61–1.24)
GFR, Estimated: >60 mL/min (ref >60)
Glucose, Bld: 134 mg/dL — ABNORMAL HIGH (ref 70–99)
Potassium: 3.7 mmol/L (ref 3.5–5.1)
Sodium: 138 mmol/L (ref 135–145)

## 2021-06-02 LAB — CBC
HCT: 41.5 % (ref 39.0–52.0)
Hemoglobin: 13.1 g/dL (ref 13.0–17.0)
MCH: 33 pg (ref 26.0–34.0)
MCHC: 31.6 g/dL (ref 30.0–36.0)
MCV: 104.5 fL — ABNORMAL HIGH (ref 80.0–100.0)
Platelets: 168 10*3/uL (ref 150–400)
RBC: 3.97 MIL/uL — ABNORMAL LOW (ref 4.22–5.81)
RDW: 14.4 % (ref 11.5–15.5)
WBC: 9.5 10*3/uL (ref 4.0–10.5)
nRBC: 0 % (ref 0.0–0.2)

## 2021-06-02 LAB — BRAIN NATRIURETIC PEPTIDE: B Natriuretic Peptide: 118 pg/mL — ABNORMAL HIGH (ref 0.0–100.0)

## 2021-06-02 LAB — GLUCOSE, CAPILLARY
Glucose-Capillary: 142 mg/dL — ABNORMAL HIGH (ref 70–99)
Glucose-Capillary: 218 mg/dL — ABNORMAL HIGH (ref 70–99)
Glucose-Capillary: 152 mg/dL — ABNORMAL HIGH (ref 70–99)
Glucose-Capillary: 216 mg/dL — ABNORMAL HIGH (ref 70–99)

## 2021-06-02 LAB — MAGNESIUM: Magnesium: 1.8 mg/dL (ref 1.7–2.4)

## 2021-06-02 NOTE — Progress Notes (Signed)
° ° °  TEE/DCCV cancelled for today due to patient still requiring 5L [place] and volume overload on examination. Tentatively scheduled for tomorrow at 11:30 AM. Patient and family aware the timing of this will be based off his O2 requirement. Continue with IV diuresis today. Will be NPO after midnight. Orders entered.   Signed, Erma Heritage, PA-C 06/02/2021, 9:54 AM

## 2021-06-02 NOTE — Progress Notes (Addendum)
Progress Note  Patient Name: [name] Date of Encounter: 06/02/2021  [facility] Cardiologist: Carlyle Dolly, MD   Subjective   Breathing improved but not back to baseline. Lower extremity edema significantly improved from admission. Says his weight had peaked at more than 300 lbs at home and down to 272 lbs today. Says his baseline weight is around 260 - 265 lbs. No chest pain or palpitations.   Inpatient Medications    Scheduled Meds:  apixaban  5 mg Oral BID   arformoterol  15 mcg Nebulization BID   And   umeclidinium bromide  1 puff Inhalation Daily   atorvastatin  20 mg Oral QPM   budesonide (PULMICORT) nebulizer solution  0.25 mg Nebulization BID   Chlorhexidine Gluconate Cloth  6 each Topical Q0600   digoxin  0.25 mg Oral Daily   furosemide  80 mg Intravenous Q12H   insulin aspart  0-15 Units Subcutaneous TID WC   mouth rinse  15 mL Mouth Rinse BID   nicotine  21 mg Transdermal Daily   potassium chloride  10 mEq Oral BID   Continuous Infusions:  diltiazem (CARDIZEM) infusion 15 mg/hr (06/01/21 1946)   PRN Meds: acetaminophen **OR** acetaminophen, albuterol, bisacodyl, ondansetron **OR** ondansetron (ZOFRAN) IV, oxyCODONE, traZODone   Vital Signs    Vitals:   06/02/21 0600 06/02/21 0710 06/02/21 0726 06/02/21 0750  BP: 111/78 101/69    Pulse: (!) 126 (!) 127  (!) 127  Resp: 16 15    Temp: 98.6 F (37 C)     TempSrc: Axillary     SpO2: 93% 90% 96%   Weight:      Height:        Intake/Output Summary (Last 24 hours) at 06/02/2021 0751 Last data filed at 06/02/2021 0500 Gross per 24 hour  Intake 1094.18 ml  Output 7875 ml  Net -6780.82 ml   Last 3 Weights 06/02/2021 06/01/2021 05/31/2021  Weight (lbs) 272 lb 4.3 oz 282 lb 10.1 oz 292 lb 8.8 oz  Weight (kg) 123.5 kg 128.2 kg 132.7 kg      Telemetry    Atrial flutter with RVR, HR sustaining in the 120's.  - Personally Reviewed  ECG    No new tracings.   Physical Exam   GEN: Pleasant  male appearing in no acute distress.   Neck: JVD at 9-10 cm.  Cardiac: Irregularly irregular, tachycardiac. no murmurs, rubs, or gallops.  Respiratory: Decreased breath sounds along bases bilaterally. No wheezing.  GI: Soft, nontender, non-distended  MS: 1+ pitting edema bilaterally; No deformity. Neuro:  Nonfocal  Psych: Normal affect   Labs    High Sensitivity Troponin:   Recent Labs  Lab 05/31/21 0841 05/31/21 1023  TROPONINIHS 22* 20*     Chemistry Recent Labs  Lab 05/31/21 0841 06/01/21 0401 06/02/21 0355  NA 137 138 138  K 4.5 4.1 3.7  CL 102 97* 94*  CO2 28 30 35*  GLUCOSE 158* 111* 134*  BUN 20 19 16   CREATININE 1.19 0.93 0.96  CALCIUM 8.8* 9.0 8.5*  MG 1.8 1.7 1.8  PROT 7.0  --   --   ALBUMIN 3.8  --   --   AST 28  --   --   ALT 27  --   --   ALKPHOS 112  --   --   BILITOT 1.9*  --   --   GFRNONAA >60 >60 >60  ANIONGAP 7 11 9     Lipids  Recent  Labs  Lab 05/31/21 0841  CHOL 86  TRIG 52  HDL 41  LDLCALC 35  CHOLHDL 2.1    Hematology Recent Labs  Lab 05/31/21 0841 06/01/21 0401 06/02/21 0355  WBC 8.6 9.3 9.5  RBC 4.29 4.04* 3.97*  HGB 14.1 13.7 13.1  HCT 45.4 42.7 41.5  MCV 105.8* 105.7* 104.5*  MCH 32.9 33.9 33.0  MCHC 31.1 32.1 31.6  RDW 14.4 14.4 14.4  PLT 180 185 168   Thyroid  Recent Labs  Lab 05/31/21 0842  TSH 3.031    BNP Recent Labs  Lab 05/31/21 0841 06/01/21 0401 06/02/21 0355  BNP 155.0* 147.0* 118.0*    DDimer No results for input(s): DDIMER in the last 168 hours.   Radiology    DG Chest Portable 1 View  Result Date: 05/31/2021 CLINICAL DATA:  sob EXAM: PORTABLE CHEST 1 VIEW COMPARISON:  01/31/2018. FINDINGS: Low lung volumes with mildly increased conspicuity of left basilar opacities. Suspected small left pleural effusion. No visible pneumothorax. Cardiomediastinal silhouette is accentuated by portable AP technique. IMPRESSION: Low lung volumes with mildly increased conspicuity of left basilar opacities, which  could represent atelectasis or pneumonia. Suspected small left pleural effusion. Dedicated PA and lateral radiographs could further evaluate if clinically indicated. Electronically Signed   By: Margaretha Sheffield M.D.   On: 05/31/2021 08:58    Cardiac Studies   Echocardiogram: 06/01/2021 IMPRESSIONS     1. Left ventricular ejection fraction, by estimation, is 60 to 65%. The  left ventricle has normal function. The left ventricle has no regional  wall motion abnormalities. There is mild left ventricular hypertrophy.  Left ventricular diastolic parameters  are indeterminate. There is the interventricular septum is flattened in  systole and diastole, consistent with right ventricular pressure and  volume overload.   2. Right ventricular systolic function is mildly reduced. The right  ventricular size is mildly enlarged. There is moderately elevated  pulmonary artery systolic pressure. The estimated right ventricular  systolic pressure is 67.1 mmHg.   3. Left atrial size was mildly dilated.   4. Right atrial size was mildly dilated.   5. The mitral valve is grossly normal. Trivial mitral valve  regurgitation.   6. Tricuspid valve regurgitation is moderate.   7. The aortic valve is tricuspid. There is moderate calcification of the  aortic valve. Aortic valve regurgitation is not visualized. Aortic valve  sclerosis/calcification is present, without any evidence of aortic  stenosis. Aortic valve mean gradient  measures 6.0 mmHg.   8. Aortic dilatation noted. There is mild dilatation of the aortic root,  measuring 41 mm.   9. The inferior vena cava is dilated in size with <50% respiratory  variability, suggesting right atrial pressure of 15 mmHg.   Comparison(s): Prior images reviewed side by side. LVEF remains normal  range. There is mild RV dysfunction with evidence of moderate pulmonary  hypertension.   Patient Profile     66 y.o. male w/ PMH of HTN, COPD, coronary calcifications  by CT, tobacco use and obesity who is currently admitted for an acute CHF exacerbation and newly diagnosed atrial flutter with RVR.   Assessment & Plan    1. Acute HFpEF - Presented with worsening dyspnea on exertion and lower extremity edema for the past several weeks. Found to have an acute CHF exacerbation with BNP at 155 (daily BNP's ordered by the admitting team and will discontinue this).  - Echo shows a preserved EF of 60-65% with no regional WMA. Was noted to  have mildly reduced RV function and flattening of the interventricular septum in systole and diastole, consistent with right ventricular pressure and volume overload. PASP also elevated to 47.3 mmHg. Did have mild dilation of the aortic root to 41 mm. - He has been receiving IV Lasix 80mg  BID with an excellence response thus far as he is -12.6 L thus far and weight recorded as having declined from 303 lbs to 272 lbs. Creatinine remains stable at 0.96 with K+ at 3.7 and will continue Lasix at current dosing given his volume overload. Would anticipate 1-2 days of additional diuresis as he is still approximately 10 lbs above his baseline weight.   2. Atrial Flutter with RVR - New diagnosis for the patient this admission and likely accounts for his acute CHF exacerbation given his elevated rates. Unaware of his arrhythmia prior to admission and denies any palpitations.  - Rates remain in the 120's despite being on IV Cardizem at 15 mg/hr and on Digoxin 0.25mg  daily. As recommended in prior notes, he is scheduled for a TEE/DCCV later this morning. Orders entered and echocardiogram tech aware. Still on 5L [place] with saturations in the 90's. Will review with Dr. Gardiner Rhyme as he did not have an oxygen requirement prior to admission.  - He has been started on Eliquis 5mg  BID for anticoagulation and will receive his 5th dose this AM.   3. HTN - His BP has been soft, at 101/69 on most recent check. Currently on IV Cardizem 15 mg/hr and Digoxin 0.25mg   daily. Would anticipate switching to PO Cardizem following DCCV today.   4. Coronary Calcifications by CT - Noted on CT imaging and he had a NST in 2019 which was low-risk. No ASA given the need for anticoagulation. Continue statin therapy.   5. HLD - FLP this admission shows LDL at 35. He remains on Atorvastatin 20mg  daily.   6. Pulmonary HTN/Sleep Disordered Breathing - PASP elevated to 47.3 mmHg by echo this admission. Given this along with his atrial flutter, he will require a formal sleep study as an outpatient. Tried CPAP yesterday evening and tolerated part of the night.    For questions or updates, please contact [place] Please consult [email or web address] for contact info under        Signed, Erma Heritage, PA-C  06/02/2021, 7:51 AM    Patient seen and examined.  Agree with above documentation.  On exam, patient is alert and oriented, tachycardic, regular rhythm, no murmurs, bibasilar crackles, trace LE edema, +JVD.  SpO2 high 80s/low 90s on 6L currently.  Remains in aflutter with rate 120s.  He is net negative 6.7L on IV lasix 80 mg BID yesterday, negative 13L on admission.  Weight is down from 304 lbs to 272 lbs (thinks dry weight is 260-265 lbs).  Cr stable at 1.0.  He remains volume overloaded on exam.  Continue IV diuresis.  Given his O2 requirement, would recommend holding off on TEE/DCCV today and continuing diuresis.  If continues to diurese well and able to wean O2 would plan on TEE/DCCV tomorrow.    Donato Heinz, MD

## 2021-06-02 NOTE — Progress Notes (Signed)
Placed patient on CPAP via FFM, auto settings with 6L O2 bleed in.

## 2021-06-02 NOTE — Progress Notes (Signed)
PROGRESS NOTE   [name]  DVV:[id number] DOB: 09/11/1955 DOA: 05/31/2021 PCP: Cory Munch, PA-C   Chief Complaint  Patient presents with   Palpitations   Level of care: Stepdown  Brief Admission History:   66 y.o. male with medical history significant chronic smoking, hypertension, hyperlipidemia, colon cancer s/p partial colectomy, kidney stones, history of endovenous laser ablation left greater saphenous who reports that he has suffers from increasing edema in his legs and feet and he has gained 25 pounds since October 2022.  He reports that he has had intermittent palpitations.  He also has increasing dyspnea symptoms but no chest pain symptoms.  He is a heavy longtime smoker and has not been able to quit.  He presented to ED after he had seen his PCP today and was noted to have an irregular heart rhythm and was desaturated to 82% on room air.     ED Course: Pt presented with atrial flutter with RVR on monitor and EKG.  He was massively volume overloaded weighing nearly 305 pounds.  He had been down to 280 about 3 months ago.  He was on 3L nasal cannula and his heart rate was 173.  He was given IV lasix and started on IV cardizem infusion.  His Hs troponin was 20.  His coronavirus and influenza testing was negative.  Admission was requested for further management.    Assessment & Plan:   Principal Problem:   Atrial flutter with rapid ventricular response (HCC) Active Problems:   Acute on chronic heart failure with preserved ejection fraction (HFpEF) /Diastolic Dysfunction CHF   Moderate pulmonary hypertension (HCC)   Tobacco abuse   Type 2 diabetes mellitus (HCC)   COPD (chronic obstructive pulmonary disease) (HCC)   Hyperlipidemia   Coronary artery calcification seen on CT scan   Hypertension   History of Colon cancer   Acute heart failure (HCC)   Hyperglycemia   Elevated brain natriuretic peptide (BNP) level   Acute heart failure  - presents with massive volume  overload - he is at least 20 pounds over baseline weight - he has pitting / weeping edema in legs and feet  - IV lasix 80 mg every 12 hours - monitor weights, electrolytes, intake and output - TTE pending  - follow BMP - 2 gm sodium diet - elevated legs - continue TED hose    Essential hypertension  - follow closely while on IV lasix and IV cardizem - BP stable, follow   Atrial flutter with RVR - IV cardizem infusion  - apixaban for full anticoagulation  - TSH within normal limits and TTE with cardioversion when respiratory status allows  - CHAD2-VASc at least 4     Hyperlipidemia - lipid panel optimally controlled - continue atorvastatin    Type 2 diabetes mellitus - apparent new diagnosis  - A1c - 7.5%  - SSI coverage if needed - goal BS 140-180 in hospital, no BS >180 at this time - DC home on glucophage XR (metformin ER) to titrate up to 2 gm daily   COPD - bronchodilators reordered  - counseled to stop smoking  - nicotine patch ordered but declined   DVT prophylaxis: apixaban  Code Status: Full  Family Communication: wife bedside update 1/11 Disposition: anticipate home  Status is: Inpatient  Remains inpatient appropriate because: IV lasix required,    Consultants:  Cardiology   Procedures:  Plan for possible TEE and cardioversion over the next day or 2 if respiratory status improves  antimicrobials:  N/a   Subjective: -Dyspnea persist, O2 sats dropped to the 80s with positional change in bed despite being on 5 L of oxygen -Wife at bedside, questions answered -No frank chest pains,  Objective: Vitals:   06/02/21 1400 06/02/21 1500 06/02/21 1600 06/02/21 1700  BP: 115/66  113/77   Pulse: (!) 128 (!) 128 (!) 130   Resp: 20 (!) 27 (!) 28   Temp:    98.1 F (36.7 C)  TempSrc:    Oral  SpO2: 94% 90% (!) 89%   Weight:      Height:        Intake/Output Summary (Last 24 hours) at 06/02/2021 1854 Last data filed at 06/02/2021 1700 Gross per 24 hour   Intake 820.72 ml  Output 6275 ml  Net -5454.28 ml   Filed Weights   05/31/21 1459 06/01/21 0500 06/02/21 0500  Weight: 132.7 kg 128.2 kg 123.5 kg    Examination:  General exam: obese male, awake, alert, cooperative, Appears calm and comfortable  Nose-  5L/min Respiratory system: bibasilar crackles. Respiratory effort normal. Cardiovascular system: tachycardic rate, irregular, normal S1 & S2 heard. 1+ JVD,   2+ pedal edema. Gastrointestinal system: Abdomen is nondistended, soft and nontender.   Normal bowel sounds heard. Central nervous system: Alert and oriented. No focal neurological deficits. Extremities: 2+ pitting edema BLEs. Skin: No rashes, lesions or ulcers Psychiatry: Judgement and insight appear normal. Mood & affect appropriate.   Data Reviewed: I have personally reviewed following labs and imaging studies  CBC: Recent Labs  Lab 05/31/21 0841 06/01/21 0401 06/02/21 0355  WBC 8.6 9.3 9.5  NEUTROABS 6.5  --   --   HGB 14.1 13.7 13.1  HCT 45.4 42.7 41.5  MCV 105.8* 105.7* 104.5*  PLT 180 185 481    Basic Metabolic Panel: Recent Labs  Lab 05/31/21 0841 06/01/21 0401 06/02/21 0355  NA 137 138 138  K 4.5 4.1 3.7  CL 102 97* 94*  CO2 28 30 35*  GLUCOSE 158* 111* 134*  BUN 20 19 16   CREATININE 1.19 0.93 0.96  CALCIUM 8.8* 9.0 8.5*  MG 1.8 1.7 1.8    GFR: Estimated Creatinine Clearance: 104.2 mL/min (by C-G formula based on SCr of 0.96 mg/dL).  Liver Function Tests: Recent Labs  Lab 05/31/21 0841  AST 28  ALT 27  ALKPHOS 112  BILITOT 1.9*  PROT 7.0  ALBUMIN 3.8    CBG: Recent Labs  Lab 06/01/21 1630 06/01/21 2208 06/02/21 0802 06/02/21 1103 06/02/21 1759  GLUCAP 138* 185* 142* 218* 152*    Recent Results (from the past 240 hour(s))  Resp Panel by RT-PCR (Flu A&B, Covid) Nasopharyngeal Swab     Status: None   Collection Time: 05/31/21  8:57 AM   Specimen: Nasopharyngeal Swab; Nasopharyngeal(NP) swabs in vial transport medium   Result Value Ref Range Status   SARS Coronavirus 2 by RT PCR NEGATIVE NEGATIVE Final    Comment: (NOTE) SARS-CoV-2 target nucleic acids are NOT DETECTED.  The SARS-CoV-2 RNA is generally detectable in upper respiratory specimens during the acute phase of infection. The lowest concentration of SARS-CoV-2 viral copies this assay can detect is 138 copies/mL. A negative result does not preclude SARS-Cov-2 infection and should not be used as the sole basis for treatment or other patient management decisions. A negative result may occur with  improper specimen collection/handling, submission of specimen other than nasopharyngeal swab, presence of viral mutation(s) within the areas targeted by this assay, and inadequate number  of viral copies(<138 copies/mL). A negative result must be combined with clinical observations, patient history, and epidemiological information. The expected result is Negative.  Fact Sheet for Patients:  EntrepreneurPulse.com.au  Fact Sheet for Healthcare Providers:  IncredibleEmployment.be  This test is no t yet approved or cleared by the [place] FDA and  has been authorized for detection and/or diagnosis of SARS-CoV-2 by FDA under an Emergency Use Authorization (EUA). This EUA will remain  in effect (meaning this test can be used) for the duration of the COVID-19 declaration under Section 564(b)(1) of the Act, 21 U.S.C.section 360bbb-3(b)(1), unless the authorization is terminated  or revoked sooner.       Influenza A by PCR NEGATIVE NEGATIVE Final   Influenza B by PCR NEGATIVE NEGATIVE Final    Comment: (NOTE) The Xpert Xpress SARS-CoV-2/FLU/RSV plus assay is intended as an aid in the diagnosis of influenza from Nasopharyngeal swab specimens and should not be used as a sole basis for treatment. Nasal washings and aspirates are unacceptable for Xpert Xpress SARS-CoV-2/FLU/RSV testing.  Fact Sheet for  Patients: EntrepreneurPulse.com.au  Fact Sheet for Healthcare Providers: IncredibleEmployment.be  This test is not yet approved or cleared by the [place] FDA and has been authorized for detection and/or diagnosis of SARS-CoV-2 by FDA under an Emergency Use Authorization (EUA). This EUA will remain in effect (meaning this test can be used) for the duration of the COVID-19 declaration under Section 564(b)(1) of the Act, 21 U.S.C. section 360bbb-3(b)(1), unless the authorization is terminated or revoked.  Performed at [facility], [address] [place], [place] [address]   MRSA Next Gen by PCR, Nasal     Status: None   Collection Time: 05/31/21  3:00 PM   Specimen: Nasal Mucosa; Nasal Swab  Result Value Ref Range Status   MRSA by PCR Next Gen NOT DETECTED NOT DETECTED Final    Comment: (NOTE) The GeneXpert MRSA Assay (FDA approved for NASAL specimens only), is one component of a comprehensive MRSA colonization surveillance program. It is not intended to diagnose MRSA infection nor to guide or monitor treatment for MRSA infections. Test performance is not FDA approved in patients less than 53 years old. Performed at [facility], [address], [place], [place] [address]      Radiology Studies: ECHOCARDIOGRAM COMPLETE  Result Date: 06/01/2021    ECHOCARDIOGRAM REPORT   Patient Name:   TEGH FRANEK Date of Exam: 06/01/2021 Medical Rec #:  [id number]          Height:       73.0 in Accession #:    [id number]         Weight:       282.6 lb Date of Birth:  [birth year]           BSA:          2.492 m Patient Age:    66 years           BP:           108/77 mmHg Patient Gender: M                  HR:           127 bpm. Exam Location:  [facility] Procedure: 2D Echo, Cardiac Doppler, Color Doppler and Intracardiac            Opacification Agent Indications:    AFlutter  History:        Patient has prior history of Echocardiogram  examinations, most                  recent 02/01/2018. CHF, CAD, COPD, Arrythmias:Atrial Flutter;                 Risk Factors:Hypertension, Dyslipidemia and Current Smoker.  Sonographer:    Wenda Low Referring Phys: [place]  1. Left ventricular ejection fraction, by estimation, is 60 to 65%. The left ventricle has normal function. The left ventricle has no regional wall motion abnormalities. There is mild left ventricular hypertrophy. Left ventricular diastolic parameters are indeterminate. There is the interventricular septum is flattened in systole and diastole, consistent with right ventricular pressure and volume overload.  2. Right ventricular systolic function is mildly reduced. The right ventricular size is mildly enlarged. There is moderately elevated pulmonary artery systolic pressure. The estimated right ventricular systolic pressure is 56.8 mmHg.  3. Left atrial size was mildly dilated.  4. Right atrial size was mildly dilated.  5. The mitral valve is grossly normal. Trivial mitral valve regurgitation.  6. Tricuspid valve regurgitation is moderate.  7. The aortic valve is tricuspid. There is moderate calcification of the aortic valve. Aortic valve regurgitation is not visualized. Aortic valve sclerosis/calcification is present, without any evidence of aortic stenosis. Aortic valve mean gradient measures 6.0 mmHg.  8. Aortic dilatation noted. There is mild dilatation of the aortic root, measuring 41 mm.  9. The inferior vena cava is dilated in size with <50% respiratory variability, suggesting right atrial pressure of 15 mmHg. Comparison(s): Prior images reviewed side by side. LVEF remains normal range. There is mild RV dysfunction with evidence of moderate pulmonary hypertension. FINDINGS  Left Ventricle: Left ventricular ejection fraction, by estimation, is 60 to 65%. The left ventricle has normal function. The left ventricle has no regional wall motion abnormalities. Definity contrast agent  was given IV to delineate the left ventricular  endocardial borders. The left ventricular internal cavity size was normal in size. There is mild left ventricular hypertrophy. The interventricular septum is flattened in systole and diastole, consistent with right ventricular pressure and volume overload. Left ventricular diastolic parameters are indeterminate. Right Ventricle: The right ventricular size is mildly enlarged. No increase in right ventricular wall thickness. Right ventricular systolic function is mildly reduced. There is moderately elevated pulmonary artery systolic pressure. The tricuspid regurgitant velocity is 2.84 m/s, and with an assumed right atrial pressure of 15 mmHg, the estimated right ventricular systolic pressure is 12.7 mmHg. Left Atrium: Left atrial size was mildly dilated. Right Atrium: Right atrial size was mildly dilated. Pericardium: There is no evidence of pericardial effusion. Presence of epicardial fat layer. Mitral Valve: The mitral valve is grossly normal. Mild mitral annular calcification. Trivial mitral valve regurgitation. MV peak gradient, 4.6 mmHg. The mean mitral valve gradient is 3.0 mmHg. Tricuspid Valve: The tricuspid valve is grossly normal. Tricuspid valve regurgitation is moderate. Aortic Valve: The aortic valve is tricuspid. There is moderate calcification of the aortic valve. There is mild aortic valve annular calcification. Aortic valve regurgitation is not visualized. Aortic valve sclerosis/calcification is present, without any  evidence of aortic stenosis. Aortic valve mean gradient measures 6.0 mmHg. Aortic valve peak gradient measures 10.8 mmHg. Aortic valve area, by VTI measures 2.47 cm. Pulmonic Valve: The pulmonic valve was grossly normal. Pulmonic valve regurgitation is trivial. Aorta: Aortic dilatation noted. There is mild dilatation of the aortic root, measuring 41 mm. Venous: The inferior vena cava is dilated in size with less than 50%  respiratory  variability, suggesting right atrial pressure of 15 mmHg. IAS/Shunts: No atrial level shunt detected by color flow Doppler.  LEFT VENTRICLE PLAX 2D LVIDd:         5.00 cm   Diastology LVIDs:         3.00 cm   LV e' medial:    10.20 cm/s LV PW:         1.20 cm   LV E/e' medial:  9.7 LV IVS:        1.20 cm   LV e' lateral:   11.20 cm/s LVOT diam:     2.00 cm   LV E/e' lateral: 8.9 LV SV:         63 LV SV Index:   25 LVOT Area:     3.14 cm  RIGHT VENTRICLE RV Basal diam:  3.95 cm RV Mid diam:    3.10 cm LEFT ATRIUM             Index        RIGHT ATRIUM           Index LA diam:        4.50 cm 1.81 cm/m   RA Area:     25.20 cm LA Vol (A2C):   80.9 ml 32.46 ml/m  RA Volume:   78.20 ml  31.38 ml/m LA Vol (A4C):   97.7 ml 39.20 ml/m LA Biplane Vol: 89.3 ml 35.83 ml/m  AORTIC VALVE                     PULMONIC VALVE AV Area (Vmax):    2.55 cm      PV Vmax:       0.80 m/s AV Area (Vmean):   2.33 cm      PV Peak grad:  2.6 mmHg AV Area (VTI):     2.47 cm AV Vmax:           164.00 cm/s AV Vmean:          109.000 cm/s AV VTI:            0.256 m AV Peak Grad:      10.8 mmHg AV Mean Grad:      6.0 mmHg LVOT Vmax:         133.00 cm/s LVOT Vmean:        80.700 cm/s LVOT VTI:          0.201 m LVOT/AV VTI ratio: 0.79  AORTA Ao Root diam: 3.70 cm Ao Asc diam:  3.40 cm MITRAL VALVE               TRICUSPID VALVE MV Area (PHT): 4.80 cm    TR Peak grad:   32.3 mmHg MV Area VTI:   4.10 cm    TR Vmax:        284.00 cm/s MV Peak grad:  4.6 mmHg MV Mean grad:  3.0 mmHg    SHUNTS MV Vmax:       1.07 m/s    Systemic VTI:  0.20 m MV Vmean:      74.4 cm/s   Systemic Diam: 2.00 cm MV Decel Time: 158 msec MV E velocity: 99.40 cm/s Rozann Lesches MD Electronically signed by Rozann Lesches MD Signature Date/Time: 06/01/2021/11:17:43 AM    Final     Scheduled Meds:  apixaban  5 mg Oral BID   arformoterol  15 mcg Nebulization BID   And   umeclidinium bromide  1 puff Inhalation  Daily   atorvastatin  20 mg Oral QPM   budesonide  (PULMICORT) nebulizer solution  0.25 mg Nebulization BID   Chlorhexidine Gluconate Cloth  6 each Topical Q0600   digoxin  0.25 mg Oral Daily   furosemide  80 mg Intravenous Q12H   insulin aspart  0-15 Units Subcutaneous TID WC   mouth rinse  15 mL Mouth Rinse BID   nicotine  21 mg Transdermal Daily   potassium chloride  10 mEq Oral BID   Continuous Infusions:  diltiazem (CARDIZEM) infusion 15 mg/hr (06/02/21 1337)    LOS: 2 days     Roxan Hockey, MD How to contact the [facility] Attending or Consulting provider [place] or covering provider during after hours [place], for this patient?  Check the care team in [facility] and look for a) attending/consulting [facility] provider listed and b) the [facility] team listed Log into [email or web address] and use [place]'s universal password to access. If you do not have the password, please contact the hospital operator. Locate the [facility] provider you are looking for under [facility] and page to a number that you can be directly reached. If you still have difficulty reaching the provider, please page the [facility] (Director on Call) for the Hospitalists listed on amion for assistance.  06/02/2021, 6:54 PM

## 2021-06-03 ENCOUNTER — Inpatient Hospital Stay (HOSPITAL_COMMUNITY): Payer: Medicare HMO

## 2021-06-03 ENCOUNTER — Inpatient Hospital Stay (HOSPITAL_COMMUNITY): Payer: Medicare HMO | Admitting: Anesthesiology

## 2021-06-03 ENCOUNTER — Other Ambulatory Visit (HOSPITAL_COMMUNITY): Payer: Self-pay | Admitting: *Deleted

## 2021-06-03 ENCOUNTER — Encounter (HOSPITAL_COMMUNITY)
Admission: EM | Disposition: A | Discharge status: Home Health | Payer: Self-pay | Source: Ambulatory Visit | Attending: Family Medicine

## 2021-06-03 ENCOUNTER — Encounter (HOSPITAL_COMMUNITY): Payer: Self-pay | Admitting: Family Medicine

## 2021-06-03 DIAGNOSIS — I340 Nonrheumatic mitral (valve) insufficiency: Secondary | ICD-10-CM

## 2021-06-03 DIAGNOSIS — I361 Nonrheumatic tricuspid (valve) insufficiency: Secondary | ICD-10-CM

## 2021-06-03 DIAGNOSIS — I4892 Unspecified atrial flutter: Secondary | ICD-10-CM

## 2021-06-03 DIAGNOSIS — I5031 Acute diastolic (congestive) heart failure: Secondary | ICD-10-CM

## 2021-06-03 HISTORY — PX: TEE WITHOUT CARDIOVERSION: SHX5443

## 2021-06-03 HISTORY — PX: CARDIOVERSION: SHX1299

## 2021-06-03 LAB — CBC
HCT: 43.4 % (ref 39.0–52.0)
Hemoglobin: 13.7 g/dL (ref 13.0–17.0)
MCH: 32.8 pg (ref 26.0–34.0)
MCHC: 31.6 g/dL (ref 30.0–36.0)
MCV: 103.8 fL — ABNORMAL HIGH (ref 80.0–100.0)
Platelets: 189 10*3/uL (ref 150–400)
RBC: 4.18 MIL/uL — ABNORMAL LOW (ref 4.22–5.81)
RDW: 14.3 % (ref 11.5–15.5)
WBC: 10.1 10*3/uL (ref 4.0–10.5)
nRBC: 0 % (ref 0.0–0.2)

## 2021-06-03 LAB — GLUCOSE, CAPILLARY
Glucose-Capillary: 139 mg/dL — ABNORMAL HIGH (ref 70–99)
Glucose-Capillary: 140 mg/dL — ABNORMAL HIGH (ref 70–99)
Glucose-Capillary: 224 mg/dL — ABNORMAL HIGH (ref 70–99)
Glucose-Capillary: 174 mg/dL — ABNORMAL HIGH (ref 70–99)
Glucose-Capillary: 163 mg/dL — ABNORMAL HIGH (ref 70–99)

## 2021-06-03 LAB — BASIC METABOLIC PANEL
Anion gap: 9 (ref 5–15)
BUN: 18 mg/dL (ref 8–23)
CO2: 35 mmol/L — ABNORMAL HIGH (ref 22–32)
Calcium: 9 mg/dL (ref 8.9–10.3)
Chloride: 92 mmol/L — ABNORMAL LOW (ref 98–111)
Creatinine, Ser: 0.95 mg/dL (ref 0.61–1.24)
GFR, Estimated: >60 mL/min (ref >60)
Glucose, Bld: 115 mg/dL — ABNORMAL HIGH (ref 70–99)
Potassium: 3.7 mmol/L (ref 3.5–5.1)
Sodium: 136 mmol/L (ref 135–145)

## 2021-06-03 LAB — MAGNESIUM: Magnesium: 1.7 mg/dL (ref 1.7–2.4)

## 2021-06-03 SURGERY — ECHOCARDIOGRAM, TRANSESOPHAGEAL
Anesthesia: General

## 2021-06-03 MED ORDER — LACTATED RINGERS IV SOLN: INTRAVENOUS | Status: DC

## 2021-06-03 MED ORDER — FUROSEMIDE 10 MG/ML IJ SOLN
80.0000 mg | Freq: Every day | INTRAMUSCULAR | Status: DC
  Administered 2021-06-04 – 2021-06-05 (×2): 80 mg via INTRAVENOUS
  Filled 2021-06-03 (×2): qty 8

## 2021-06-03 MED ORDER — LIDOCAINE 2% (20 MG/ML) 5 ML SYRINGE
INTRAMUSCULAR | Status: DC | PRN
  Administered 2021-06-03: 100 mg via INTRAVENOUS

## 2021-06-03 MED ORDER — PROPOFOL 10 MG/ML IV BOLUS
INTRAVENOUS | Status: DC | PRN
Start: 2021-06-03 — End: 2021-06-03
  Administered 2021-06-03: 100 mg via INTRAVENOUS
  Administered 2021-06-03 (×2): 30 mg via INTRAVENOUS

## 2021-06-03 MED ORDER — ORAL CARE MOUTH RINSE: 15.0000 mL | Freq: Once | OROMUCOSAL | Status: AC

## 2021-06-03 MED ORDER — PHENYLEPHRINE 40 MCG/ML (10ML) SYRINGE FOR IV PUSH (FOR BLOOD PRESSURE SUPPORT)
PREFILLED_SYRINGE | INTRAVENOUS | Status: DC | PRN
  Administered 2021-06-03 (×3): 80 ug via INTRAVENOUS

## 2021-06-03 MED ORDER — BISOPROLOL FUMARATE 5 MG PO TABS
5.0000 mg | ORAL_TABLET | Freq: Every day | ORAL | Status: DC
  Administered 2021-06-03 – 2021-06-05 (×3): 5 mg via ORAL
  Filled 2021-06-03 (×3): qty 1

## 2021-06-03 MED ORDER — CHLORHEXIDINE GLUCONATE 0.12 % MT SOLN
15.0000 mL | Freq: Once | OROMUCOSAL | Status: AC
  Administered 2021-06-03: 15 mL via OROMUCOSAL

## 2021-06-03 NOTE — Interval H&P Note (Signed)
History and Physical Interval Note:  06/03/2021 11:45 AM  [name]  has presented today for surgery, with the diagnosis of atrial flutter.  The various methods of treatment have been discussed with the patient and family. After consideration of risks, benefits and other options for treatment, the patient has consented to  Procedure(s): TRANSESOPHAGEAL ECHOCARDIOGRAM (TEE) (N/A) CARDIOVERSION (N/A) as a surgical intervention.  The patient's history has been reviewed, patient examined, no change in status, stable for surgery.  I have reviewed the patient's chart and labs.  Questions were answered to the patient's satisfaction.     Rozann Lesches

## 2021-06-03 NOTE — Anesthesia Preprocedure Evaluation (Signed)
Anesthesia Evaluation  Patient identified by MRN, date of birth, ID band Patient awake    Reviewed: Allergy & Precautions, H&P , NPO status , Patient's Chart, lab work & pertinent test results, reviewed documented beta blocker date and time   Airway Mallampati: II  TM Distance: >3 FB Neck ROM: full    Dental no notable dental hx.    Pulmonary COPD, Current Smoker,    Pulmonary exam normal breath sounds clear to auscultation       Cardiovascular Exercise Tolerance: Good hypertension, pulmonary hypertension+ CAD and +CHF   Rhythm:regular Rate:Normal     Neuro/Psych negative neurological ROS  negative psych ROS   GI/Hepatic negative GI ROS, Neg liver ROS,   Endo/Other  diabetes, Type 2  Renal/GU ARFRenal disease  negative genitourinary   Musculoskeletal   Abdominal   Peds  Hematology negative hematology ROS (+)   Anesthesia Other Findings   Reproductive/Obstetrics negative OB ROS                             Anesthesia Physical Anesthesia Plan  ASA: 4 and emergent  Anesthesia Plan: General   Post-op Pain Management:    Induction:   PONV Risk Score and Plan: Propofol infusion  Airway Management Planned:   Additional Equipment:   Intra-op Plan:   Post-operative Plan:   Informed Consent: I have reviewed the patients History and Physical, chart, labs and discussed the procedure including the risks, benefits and alternatives for the proposed anesthesia with the patient or authorized representative who has indicated his/her understanding and acceptance.     Dental Advisory Given  Plan Discussed with: CRNA  Anesthesia Plan Comments:         Anesthesia Quick Evaluation

## 2021-06-03 NOTE — Anesthesia Procedure Notes (Signed)
Date/Time: 06/03/2021 10:58 AM Performed by: Orlie Dakin, CRNA Pre-anesthesia Checklist: Patient identified, Emergency Drugs available, Suction available and Patient being monitored Patient Re-evaluated:Patient Re-evaluated prior to induction Oxygen Delivery Method: Non-rebreather mask Induction Type: IV induction Placement Confirmation: positive ETCO2

## 2021-06-03 NOTE — Transfer of Care (Signed)
Immediate Anesthesia Transfer of Care Note  Patient: [name]  Procedure(s) Performed: TRANSESOPHAGEAL ECHOCARDIOGRAM (TEE) CARDIOVERSION  Patient Location: PACU  Anesthesia Type:General  Level of Consciousness: drowsy  Airway & Oxygen Therapy: Patient Spontanous Breathing and Patient connected to face mask oxygen  Post-op Assessment: Report given to RN and Post -op Vital signs reviewed and stable  Post vital signs: Reviewed and stable  Last Vitals:  Vitals Value Taken Time  BP    Temp    Pulse    Resp    SpO2      Last Pain:  Vitals:   06/03/21 1018  TempSrc: Oral  PainSc: 0-No pain         Complications: No notable events documented.

## 2021-06-03 NOTE — H&P (View-Only) (Signed)
Progress Note  Patient Name: [name] Date of Encounter: 06/03/2021  Primary Cardiologist: Carlyle Dolly, MD  Subjective   Breathing subjectively improved.  No palpitations or chest pain.  Inpatient Medications    Scheduled Meds:  apixaban  5 mg Oral BID   arformoterol  15 mcg Nebulization BID   And   umeclidinium bromide  1 puff Inhalation Daily   atorvastatin  20 mg Oral QPM   budesonide (PULMICORT) nebulizer solution  0.25 mg Nebulization BID   Chlorhexidine Gluconate Cloth  6 each Topical Q0600   digoxin  0.25 mg Oral Daily   furosemide  80 mg Intravenous Q12H   insulin aspart  0-15 Units Subcutaneous TID WC   mouth rinse  15 mL Mouth Rinse BID   nicotine  21 mg Transdermal Daily   potassium chloride  10 mEq Oral BID   Continuous Infusions:  diltiazem (CARDIZEM) infusion 15 mg/hr (06/03/21 0728)   PRN Meds: acetaminophen **OR** acetaminophen, albuterol, bisacodyl, ondansetron **OR** ondansetron (ZOFRAN) IV, oxyCODONE, traZODone   Vital Signs    Vitals:   06/03/21 0400 06/03/21 0500 06/03/21 0501 06/03/21 0736  BP: 108/72 106/71    Pulse: (!) 125 (!) 125    Resp: 16 15    Temp:   98 F (36.7 C)   TempSrc:   Axillary   SpO2: 93% 91%  95%  Weight:  119.6 kg    Height:        Intake/Output Summary (Last 24 hours) at 06/03/2021 0829 Last data filed at 06/03/2021 0545 Gross per 24 hour  Intake 727.04 ml  Output 6100 ml  Net -5372.96 ml   Filed Weights   06/01/21 0500 06/02/21 0500 06/03/21 0500  Weight: 128.2 kg 123.5 kg 119.6 kg    Telemetry    Atrial flutter with 2:1 block.  Personally reviewed.  ECG    An ECG dated 06/01/2021 was personally reviewed today and demonstrated:  Atypical atrial flutter with 2:1 block and incomplete right bundle branch block.  Physical Exam   GEN: Obese male.  No acute distress.   Neck: No JVD. Cardiac: Rapid regular rhythm, no murmur or gallop.  Respiratory: Nonlabored.  Decreased breath sounds  without wheezing. GI: Soft, nontender, bowel sounds present. MS: 1+ leg edema with venous stasis. Neuro:  Nonfocal. Psych: Alert and oriented x 3. Normal affect.  Labs    Chemistry Recent Labs  Lab 05/31/21 0841 06/01/21 0401 06/02/21 0355 06/03/21 0342  NA 137 138 138 136  K 4.5 4.1 3.7 3.7  CL 102 97* 94* 92*  CO2 28 30 35* 35*  GLUCOSE 158* 111* 134* 115*  BUN 20 19 16 18   CREATININE 1.19 0.93 0.96 0.95  CALCIUM 8.8* 9.0 8.5* 9.0  PROT 7.0  --   --   --   ALBUMIN 3.8  --   --   --   AST 28  --   --   --   ALT 27  --   --   --   ALKPHOS 112  --   --   --   BILITOT 1.9*  --   --   --   GFRNONAA >60 >60 >60 >60  ANIONGAP 7 11 9 9      Hematology Recent Labs  Lab 06/01/21 0401 06/02/21 0355 06/03/21 0342  WBC 9.3 9.5 10.1  RBC 4.04* 3.97* 4.18*  HGB 13.7 13.1 13.7  HCT 42.7 41.5 43.4  MCV 105.7* 104.5* 103.8*  MCH 33.9 33.0 32.8  MCHC 32.1 31.6 31.6  RDW 14.4 14.4 14.3  PLT 185 168 189    Cardiac Enzymes Recent Labs  Lab 05/31/21 0841 05/31/21 1023  TROPONINIHS 22* 20*    BNP Recent Labs  Lab 05/31/21 0841 06/01/21 0401 06/02/21 0355  BNP 155.0* 147.0* 118.0*     Radiology    ECHOCARDIOGRAM COMPLETE  Result Date: 06/01/2021    ECHOCARDIOGRAM REPORT   Patient Name:   JACION DISMORE Date of Exam: 06/01/2021 Medical Rec #:  [id number]          Height:       73.0 in Accession #:    [id number]         Weight:       282.6 lb Date of Birth:  Feb 17, 1956           BSA:          2.492 m Patient Age:    66 years           BP:           108/77 mmHg Patient Gender: M                  HR:           127 bpm. Exam Location:  [facility] Procedure: 2D Echo, Cardiac Doppler, Color Doppler and Intracardiac            Opacification Agent Indications:    AFlutter  History:        Patient has prior history of Echocardiogram examinations, most                 recent 02/01/2018. CHF, CAD, COPD, Arrythmias:Atrial Flutter;                 Risk Factors:Hypertension,  Dyslipidemia and Current Smoker.  Sonographer:    Wenda Low Referring Phys: [place]  1. Left ventricular ejection fraction, by estimation, is 60 to 65%. The left ventricle has normal function. The left ventricle has no regional wall motion abnormalities. There is mild left ventricular hypertrophy. Left ventricular diastolic parameters are indeterminate. There is the interventricular septum is flattened in systole and diastole, consistent with right ventricular pressure and volume overload.  2. Right ventricular systolic function is mildly reduced. The right ventricular size is mildly enlarged. There is moderately elevated pulmonary artery systolic pressure. The estimated right ventricular systolic pressure is 97.3 mmHg.  3. Left atrial size was mildly dilated.  4. Right atrial size was mildly dilated.  5. The mitral valve is grossly normal. Trivial mitral valve regurgitation.  6. Tricuspid valve regurgitation is moderate.  7. The aortic valve is tricuspid. There is moderate calcification of the aortic valve. Aortic valve regurgitation is not visualized. Aortic valve sclerosis/calcification is present, without any evidence of aortic stenosis. Aortic valve mean gradient measures 6.0 mmHg.  8. Aortic dilatation noted. There is mild dilatation of the aortic root, measuring 41 mm.  9. The inferior vena cava is dilated in size with <50% respiratory variability, suggesting right atrial pressure of 15 mmHg. Comparison(s): Prior images reviewed side by side. LVEF remains normal range. There is mild RV dysfunction with evidence of moderate pulmonary hypertension. FINDINGS  Left Ventricle: Left ventricular ejection fraction, by estimation, is 60 to 65%. The left ventricle has normal function. The left ventricle has no regional wall motion abnormalities. Definity contrast agent was given IV to delineate the left ventricular  endocardial borders. The left ventricular internal cavity size  was  normal in size. There is mild left ventricular hypertrophy. The interventricular septum is flattened in systole and diastole, consistent with right ventricular pressure and volume overload. Left ventricular diastolic parameters are indeterminate. Right Ventricle: The right ventricular size is mildly enlarged. No increase in right ventricular wall thickness. Right ventricular systolic function is mildly reduced. There is moderately elevated pulmonary artery systolic pressure. The tricuspid regurgitant velocity is 2.84 m/s, and with an assumed right atrial pressure of 15 mmHg, the estimated right ventricular systolic pressure is 85.8 mmHg. Left Atrium: Left atrial size was mildly dilated. Right Atrium: Right atrial size was mildly dilated. Pericardium: There is no evidence of pericardial effusion. Presence of epicardial fat layer. Mitral Valve: The mitral valve is grossly normal. Mild mitral annular calcification. Trivial mitral valve regurgitation. MV peak gradient, 4.6 mmHg. The mean mitral valve gradient is 3.0 mmHg. Tricuspid Valve: The tricuspid valve is grossly normal. Tricuspid valve regurgitation is moderate. Aortic Valve: The aortic valve is tricuspid. There is moderate calcification of the aortic valve. There is mild aortic valve annular calcification. Aortic valve regurgitation is not visualized. Aortic valve sclerosis/calcification is present, without any  evidence of aortic stenosis. Aortic valve mean gradient measures 6.0 mmHg. Aortic valve peak gradient measures 10.8 mmHg. Aortic valve area, by VTI measures 2.47 cm. Pulmonic Valve: The pulmonic valve was grossly normal. Pulmonic valve regurgitation is trivial. Aorta: Aortic dilatation noted. There is mild dilatation of the aortic root, measuring 41 mm. Venous: The inferior vena cava is dilated in size with less than 50% respiratory variability, suggesting right atrial pressure of 15 mmHg. IAS/Shunts: No atrial level shunt detected by color flow  Doppler.  LEFT VENTRICLE PLAX 2D LVIDd:         5.00 cm   Diastology LVIDs:         3.00 cm   LV e' medial:    10.20 cm/s LV PW:         1.20 cm   LV E/e' medial:  9.7 LV IVS:        1.20 cm   LV e' lateral:   11.20 cm/s LVOT diam:     2.00 cm   LV E/e' lateral: 8.9 LV SV:         63 LV SV Index:   25 LVOT Area:     3.14 cm  RIGHT VENTRICLE RV Basal diam:  3.95 cm RV Mid diam:    3.10 cm LEFT ATRIUM             Index        RIGHT ATRIUM           Index LA diam:        4.50 cm 1.81 cm/m   RA Area:     25.20 cm LA Vol (A2C):   80.9 ml 32.46 ml/m  RA Volume:   78.20 ml  31.38 ml/m LA Vol (A4C):   97.7 ml 39.20 ml/m LA Biplane Vol: 89.3 ml 35.83 ml/m  AORTIC VALVE                     PULMONIC VALVE AV Area (Vmax):    2.55 cm      PV Vmax:       0.80 m/s AV Area (Vmean):   2.33 cm      PV Peak grad:  2.6 mmHg AV Area (VTI):     2.47 cm AV Vmax:  164.00 cm/s AV Vmean:          109.000 cm/s AV VTI:            0.256 m AV Peak Grad:      10.8 mmHg AV Mean Grad:      6.0 mmHg LVOT Vmax:         133.00 cm/s LVOT Vmean:        80.700 cm/s LVOT VTI:          0.201 m LVOT/AV VTI ratio: 0.79  AORTA Ao Root diam: 3.70 cm Ao Asc diam:  3.40 cm MITRAL VALVE               TRICUSPID VALVE MV Area (PHT): 4.80 cm    TR Peak grad:   32.3 mmHg MV Area VTI:   4.10 cm    TR Vmax:        284.00 cm/s MV Peak grad:  4.6 mmHg MV Mean grad:  3.0 mmHg    SHUNTS MV Vmax:       1.07 m/s    Systemic VTI:  0.20 m MV Vmean:      74.4 cm/s   Systemic Diam: 2.00 cm MV Decel Time: 158 msec MV E velocity: 99.40 cm/s Rozann Lesches MD Electronically signed by Rozann Lesches MD Signature Date/Time: 06/01/2021/11:17:43 AM    Final      Assessment & Plan    1.  Persistent, atypical atrial flutter with RVR, CHA2DS2-VASc score of 4.  Duration uncertain but potentially over the last few weeks at least with associated fluid overload and weight gain.  Started on Eliquis, also IV diltiazem and Lanoxin with heart rates in the 120s,  otherwise hemodynamically stable.  Anticipate TEE guided cardioversion today.  2.  Acute diastolic heart failure.  At least 20 pound weight gain over baseline in the last few weeks.  Responding very well to IV Lasix, net output of 18 L in the last 72 hours.  Weight back down to baseline.  3.  Essential hypertension.  4.  Coronary artery calcification by CT imaging.  No ischemia by Myoview in 2019.  No recent angina symptoms.  High-sensitivity troponin I levels are flat and in the 20s, not suggestive of ACS.  Plan to reposition pulse oximeter.  Oxygen saturation low 90s this morning.  Was on BiPAP overnight.  Discussed risk of sedation in terms of respiratory depression, ideally would like to get him back in sinus rhythm today.  Continue with plan for TEE guided cardioversion.  Signed, Rozann Lesches, MD  06/03/2021, 8:29 AM

## 2021-06-03 NOTE — Progress Notes (Signed)
PROGRESS NOTE   [name]  NLG:[id number] DOB: [birth year] DOA: 05/31/2021 PCP: Cory Munch, PA-C   Chief Complaint  Patient presents with   Palpitations   Level of care: Stepdown  Brief Admission History:   66 y.o. male with medical history significant chronic smoking, hypertension, hyperlipidemia, colon cancer s/p partial colectomy, kidney stones, history of endovenous laser ablation left greater saphenous who reports that he has suffers from increasing edema in his legs and feet and he has gained 25 pounds since October 2022.  He reports that he has had intermittent palpitations.  He also has increasing dyspnea symptoms but no chest pain symptoms.  He is a heavy longtime smoker and has not been able to quit.  He presented to ED after he had seen his PCP today and was noted to have an irregular heart rhythm and was desaturated to 82% on room air.     ED Course: Pt presented with atrial flutter with RVR on monitor and EKG.  He was massively volume overloaded weighing nearly 305 pounds.  He had been down to 280 about 3 months ago.  He was on 3L nasal cannula and his heart rate was 173.  He was given IV lasix and started on IV cardizem infusion.  His Hs troponin was 20.  His coronavirus and influenza testing was negative.  Admission was requested for further management.    Assessment & Plan:   Principal Problem:   Atrial flutter with rapid ventricular response (HCC) Active Problems:   Acute on chronic heart failure with preserved ejection fraction (HFpEF) /Diastolic Dysfunction CHF   Moderate pulmonary hypertension (HCC)   Tobacco abuse   Type 2 diabetes mellitus (HCC)   COPD (chronic obstructive pulmonary disease) (HCC)   Hyperlipidemia   Coronary artery calcification seen on CT scan   Hypertension   History of Colon cancer   Acute heart failure (HCC)   Hyperglycemia   Elevated brain natriuretic peptide (BNP) level   HFpEF-Acute on chronic diastolic dysfunction heart  failure  - presents with massive volume overload - he is at least 20 pounds over baseline weight - he has pitting / weeping edema in legs and feet  - IV lasix 80 mg every 12 hours - monitor weights, electrolytes, intake and output - follow BMP - 2 gm sodium diet - elevated legs -Echo from 06/01/2021 with EF of 60 to 65% and evidence of right ventricular pressure and volume overload - continue TED hose  -Patient remains very symptomatic from a CHF standpoint requiring 6 L of oxygen via nasal cannula, very short of breath/significant dyspnea on exertion and edema requires additional IV diuresis   Essential hypertension  -Bisoprolol and IV Lasix as ordered - BP stable, follow   Atrial flutter with RVR Doing much better after successful TEE and cardioversion on 06/03/21 - apixaban for full anticoagulation  - TSH within normal limits  - CHAD2-VASc at least 4  -Continue bisoprolol    Hyperlipidemia - lipid panel optimally controlled - continue atorvastatin    Type 2 diabetes mellitus - apparent new diagnosis  - A1c - 7.5%  - SSI coverage if needed - goal BS 140-180 in hospital, no BS >180 at this time - DC home on glucophage XR (metformin ER) to titrate up to 2 gm daily   COPD - bronchodilators reordered  - counseled to stop smoking  - nicotine patch ordered but declined   DVT prophylaxis: apixaban  Code Status: Full  Family Communication: wife bedside update  1/11 Disposition: anticipate home  Status is: Inpatient  Remains inpatient appropriate because: Significant volume overload/CHF requiring IV lasix required,    Consultants:  Cardiology   Procedures:  Status post TEE and cardioversion 06/03/21 antimicrobials:  N/a   Subjective: --Wife at bedside, feeling better after cardioversion -Denies chest pains or increased shortness of breath  Objective: Vitals:   06/03/21 1500 06/03/21 1600 06/03/21 1700 06/03/21 1800  BP: 108/71 108/71 115/69 113/69  Pulse: 80 73 76  76  Resp: (!) 23 20 20 19   Temp:  97.9 F (36.6 C)    TempSrc:  Oral    SpO2: 95% 91%    Weight:      Height:        Intake/Output Summary (Last 24 hours) at 06/03/2021 1905 Last data filed at 06/03/2021 1800 Gross per 24 hour  Intake 787.04 ml  Output 5800 ml  Net -5012.96 ml   Filed Weights   06/02/21 0500 06/03/21 0500 06/03/21 1018  Weight: 123.5 kg 119.6 kg 119.6 kg    Examination:  General exam: obese male, awake, alert, cooperative, Appears calm and comfortable  Nose- [place] 6L/min Respiratory system: bibasilar crackles.  Diminished in bases  cardiovascular system:  normal S1 & S2 heard. 1+ JVD,   2+ pedal edema. Gastrointestinal system: Abdomen is nondistended, soft and nontender.   Normal bowel sounds heard. Central nervous system: Alert and oriented. No focal neurological deficits. Extremities: 2+ pitting edema BLEs. Skin: No rashes, lesions or ulcers Psychiatry: Judgement and insight appear normal. Mood & affect appropriate.   Data Reviewed: I have personally reviewed following labs and imaging studies  CBC: Recent Labs  Lab 05/31/21 0841 06/01/21 0401 06/02/21 0355 06/03/21 0342  WBC 8.6 9.3 9.5 10.1  NEUTROABS 6.5  --   --   --   HGB 14.1 13.7 13.1 13.7  HCT 45.4 42.7 41.5 43.4  MCV 105.8* 105.7* 104.5* 103.8*  PLT 180 185 168 294    Basic Metabolic Panel: Recent Labs  Lab 05/31/21 0841 06/01/21 0401 06/02/21 0355 06/03/21 0342  NA 137 138 138 136  K 4.5 4.1 3.7 3.7  CL 102 97* 94* 92*  CO2 28 30 35* 35*  GLUCOSE 158* 111* 134* 115*  BUN 20 19 16 18   CREATININE 1.19 0.93 0.96 0.95  CALCIUM 8.8* 9.0 8.5* 9.0  MG 1.8 1.7 1.8 1.7    GFR: Estimated Creatinine Clearance: 103.6 mL/min (by C-G formula based on SCr of 0.95 mg/dL).  Liver Function Tests: Recent Labs  Lab 05/31/21 0841  AST 28  ALT 27  ALKPHOS 112  BILITOT 1.9*  PROT 7.0  ALBUMIN 3.8    CBG: Recent Labs  Lab 06/02/21 1759 06/02/21 2127 06/03/21 0740 06/03/21 1234  06/03/21 1626  GLUCAP 152* 216* 139* 140* 224*    Recent Results (from the past 240 hour(s))  Resp Panel by RT-PCR (Flu A&B, Covid) Nasopharyngeal Swab     Status: None   Collection Time: 05/31/21  8:57 AM   Specimen: Nasopharyngeal Swab; Nasopharyngeal(NP) swabs in vial transport medium  Result Value Ref Range Status   SARS Coronavirus 2 by RT PCR NEGATIVE NEGATIVE Final    Comment: (NOTE) SARS-CoV-2 target nucleic acids are NOT DETECTED.  The SARS-CoV-2 RNA is generally detectable in upper respiratory specimens during the acute phase of infection. The lowest concentration of SARS-CoV-2 viral copies this assay can detect is 138 copies/mL. A negative result does not preclude SARS-Cov-2 infection and should not be used as the sole basis  for treatment or other patient management decisions. A negative result may occur with  improper specimen collection/handling, submission of specimen other than nasopharyngeal swab, presence of viral mutation(s) within the areas targeted by this assay, and inadequate number of viral copies(<138 copies/mL). A negative result must be combined with clinical observations, patient history, and epidemiological information. The expected result is Negative.  Fact Sheet for Patients:  EntrepreneurPulse.com.au  Fact Sheet for Healthcare Providers:  IncredibleEmployment.be  This test is no t yet approved or cleared by the [place] FDA and  has been authorized for detection and/or diagnosis of SARS-CoV-2 by FDA under an Emergency Use Authorization (EUA). This EUA will remain  in effect (meaning this test can be used) for the duration of the COVID-19 declaration under Section 564(b)(1) of the Act, 21 U.S.C.section 360bbb-3(b)(1), unless the authorization is terminated  or revoked sooner.       Influenza A by PCR NEGATIVE NEGATIVE Final   Influenza B by PCR NEGATIVE NEGATIVE Final    Comment: (NOTE) The Xpert  Xpress SARS-CoV-2/FLU/RSV plus assay is intended as an aid in the diagnosis of influenza from Nasopharyngeal swab specimens and should not be used as a sole basis for treatment. Nasal washings and aspirates are unacceptable for Xpert Xpress SARS-CoV-2/FLU/RSV testing.  Fact Sheet for Patients: EntrepreneurPulse.com.au  Fact Sheet for Healthcare Providers: IncredibleEmployment.be  This test is not yet approved or cleared by the [place] FDA and has been authorized for detection and/or diagnosis of SARS-CoV-2 by FDA under an Emergency Use Authorization (EUA). This EUA will remain in effect (meaning this test can be used) for the duration of the COVID-19 declaration under Section 564(b)(1) of the Act, 21 U.S.C. section 360bbb-3(b)(1), unless the authorization is terminated or revoked.  Performed at [facility], [address] [place], [place] [address]   MRSA Next Gen by PCR, Nasal     Status: None   Collection Time: 05/31/21  3:00 PM   Specimen: Nasal Mucosa; Nasal Swab  Result Value Ref Range Status   MRSA by PCR Next Gen NOT DETECTED NOT DETECTED Final    Comment: (NOTE) The GeneXpert MRSA Assay (FDA approved for NASAL specimens only), is one component of a comprehensive MRSA colonization surveillance program. It is not intended to diagnose MRSA infection nor to guide or monitor treatment for MRSA infections. Test performance is not FDA approved in patients less than 102 years old. Performed at [facility], [address], [place], [place] [address]      Radiology Studies: ECHO TEE  Result Date: 06/03/2021    TRANSESOPHOGEAL ECHO REPORT   Patient Name:   LAURANCE HEIDE Date of Exam: 06/03/2021 Medical Rec #:  [id number]          Height:       73.0 in Accession #:    [id number]         Weight:       263.7 lb Date of Birth:  10-Jul-1955           BSA:          2.420 m Patient Age:    23 years           BP:           127/81 mmHg Patient  Gender: M                  HR:           126 bpm. Exam Location:  [facility] Procedure: Transesophageal  Echo, Cardiac Doppler and Color Doppler Indications:    Atrial Fluuter I48.92  History:        Patient has prior history of Echocardiogram examinations, most                 recent 06/01/2021. COPD; Risk Factors:Hypertension, Diabetes,                 Dyslipidemia and Current Smoker. Hx of Colon cancer.  Sonographer:    Alvino Chapel RCS Referring Phys: [id number] [place]: TEE procedure time was 6 minutes. The transesophogeal probe was passed without difficulty through the esophogus of the patient. Imaged were obtained with the patient in a left lateral decubitus position. Local oropharyngeal anesthetic was provided with viscous lidocaine. Sedation performed by different physician. The patient was monitored while under deep sedation. Image quality was adequate. The patient developed no complications during the procedure. IMPRESSIONS  1. Left ventricular ejection fraction, by estimation, is 55 to 60%. The left ventricle has normal function. The left ventricle has no regional wall motion abnormalities.  2. Right ventricular systolic function was not well visualized. The right ventricular size is normal.  3. Left atrial size was moderately dilated. No left atrial/left atrial appendage thrombus was detected. The LAA emptying velocity was 60 cm/s.  4. The mitral valve is grossly normal. Mild mitral valve regurgitation.  5. Tricuspid valve regurgitation is moderate.  6. The aortic valve is tricuspid. There is mild calcification of the aortic valve. Aortic valve regurgitation is not visualized.  7. There is Moderate (Grade III) plaque involving the descending aorta. FINDINGS  Left Ventricle: Left ventricular ejection fraction, by estimation, is 55 to 60%. The left ventricle has normal function. The left ventricle has no regional wall motion abnormalities. The left ventricular internal cavity size was  normal in size. Right Ventricle: The right ventricular size is normal. Right vetricular wall thickness was not assessed. Right ventricular systolic function was not well visualized. Left Atrium: Left atrial size was moderately dilated. No left atrial/left atrial appendage thrombus was detected. The LAA emptying velocity was 60 cm/s. Right Atrium: Right atrial size was normal in size. Pericardium: There is no evidence of pericardial effusion. Mitral Valve: The mitral valve is grossly normal. Mild mitral valve regurgitation. Tricuspid Valve: The tricuspid valve is grossly normal. Tricuspid valve regurgitation is moderate. Aortic Valve: The aortic valve is tricuspid. There is mild calcification of the aortic valve. Aortic valve regurgitation is not visualized. Pulmonic Valve: The pulmonic valve was grossly normal. Pulmonic valve regurgitation is trivial. Aorta: The aortic root is normal in size and structure. There is moderate (Grade III) plaque involving the descending aorta. IAS/Shunts: No atrial level shunt detected by color flow Doppler. Rozann Lesches MD Electronically signed by Rozann Lesches MD Signature Date/Time: 06/03/2021/12:25:21 PM    Final     Scheduled Meds:  apixaban  5 mg Oral BID   arformoterol  15 mcg Nebulization BID   And   umeclidinium bromide  1 puff Inhalation Daily   atorvastatin  20 mg Oral QPM   bisoprolol  5 mg Oral Daily   budesonide (PULMICORT) nebulizer solution  0.25 mg Nebulization BID   Chlorhexidine Gluconate Cloth  6 each Topical Q0600   [START ON 06/04/2021] furosemide  80 mg Intravenous Daily   insulin aspart  0-15 Units Subcutaneous TID WC   mouth rinse  15 mL Mouth Rinse BID   nicotine  21 mg Transdermal Daily   potassium chloride  10 mEq Oral  BID   Continuous Infusions:    LOS: 3 days    Roxan Hockey, MD How to contact the [facility] Attending or Consulting provider [place] or covering provider during after hours [place], for this patient?  Check the care  team in [facility] and look for a) attending/consulting [facility] provider listed and b) the [facility] team listed Log into [email or web address] and use [place]'s universal password to access. If you do not have the password, please contact the hospital operator. Locate the [facility] provider you are looking for under [facility] and page to a number that you can be directly reached. If you still have difficulty reaching the provider, please page the [facility] (Director on Call) for the Hospitalists listed on amion for assistance.  06/03/2021, 7:05 PM

## 2021-06-03 NOTE — Progress Notes (Signed)
Electrical Cardioversion Procedure Note [name] [id number] Feb 18, 1956  Procedure: Electrical Cardioversion Indications:  Atrial Flutter  Procedure Details Consent: Risks of procedure as well as the alternatives and risks of each were explained to the (patient/caregiver).  Consent for procedure obtained. Time Out: Verified patient identification, verified procedure, site/side was marked, verified correct patient position, special equipment/implants available, medications/allergies/relevent history reviewed, required imaging and test results available.  Performed  Patient placed on cardiac monitor, pulse oximetry, supplemental oxygen as necessary.  Sedation given:  propofol Pacer pads placed anterior and posterior chest.  Cardioverted 1 time(s).  Cardioverted at 120J.  Evaluation Findings: Post procedure EKG shows: NSR Complications: None Patient did tolerate procedure well.   [name] 06/03/2021, 11:33 AM

## 2021-06-03 NOTE — Progress Notes (Signed)
*  PRELIMINARY RESULTS* Echocardiogram Echocardiogram Transesophageal has been performed.  [name] 06/03/2021, 11:36 AM

## 2021-06-03 NOTE — Progress Notes (Signed)
Progress Note  Patient Name: [name] Date of Encounter: 06/03/2021  Primary Cardiologist: Carlyle Dolly, MD  Subjective   Breathing subjectively improved.  No palpitations or chest pain.  Inpatient Medications    Scheduled Meds:  apixaban  5 mg Oral BID   arformoterol  15 mcg Nebulization BID   And   umeclidinium bromide  1 puff Inhalation Daily   atorvastatin  20 mg Oral QPM   budesonide (PULMICORT) nebulizer solution  0.25 mg Nebulization BID   Chlorhexidine Gluconate Cloth  6 each Topical Q0600   digoxin  0.25 mg Oral Daily   furosemide  80 mg Intravenous Q12H   insulin aspart  0-15 Units Subcutaneous TID WC   mouth rinse  15 mL Mouth Rinse BID   nicotine  21 mg Transdermal Daily   potassium chloride  10 mEq Oral BID   Continuous Infusions:  diltiazem (CARDIZEM) infusion 15 mg/hr (06/03/21 0728)   PRN Meds: acetaminophen **OR** acetaminophen, albuterol, bisacodyl, ondansetron **OR** ondansetron (ZOFRAN) IV, oxyCODONE, traZODone   Vital Signs    Vitals:   06/03/21 0400 06/03/21 0500 06/03/21 0501 06/03/21 0736  BP: 108/72 106/71    Pulse: (!) 125 (!) 125    Resp: 16 15    Temp:   98 F (36.7 C)   TempSrc:   Axillary   SpO2: 93% 91%  95%  Weight:  119.6 kg    Height:        Intake/Output Summary (Last 24 hours) at 06/03/2021 0829 Last data filed at 06/03/2021 0545 Gross per 24 hour  Intake 727.04 ml  Output 6100 ml  Net -5372.96 ml   Filed Weights   06/01/21 0500 06/02/21 0500 06/03/21 0500  Weight: 128.2 kg 123.5 kg 119.6 kg    Telemetry    Atrial flutter with 2:1 block.  Personally reviewed.  ECG    An ECG dated 06/01/2021 was personally reviewed today and demonstrated:  Atypical atrial flutter with 2:1 block and incomplete right bundle branch block.  Physical Exam   GEN: Obese male.  No acute distress.   Neck: No JVD. Cardiac: Rapid regular rhythm, no murmur or gallop.  Respiratory: Nonlabored.  Decreased breath sounds  without wheezing. GI: Soft, nontender, bowel sounds present. MS: 1+ leg edema with venous stasis. Neuro:  Nonfocal. Psych: Alert and oriented x 3. Normal affect.  Labs    Chemistry Recent Labs  Lab 05/31/21 0841 06/01/21 0401 06/02/21 0355 06/03/21 0342  NA 137 138 138 136  K 4.5 4.1 3.7 3.7  CL 102 97* 94* 92*  CO2 28 30 35* 35*  GLUCOSE 158* 111* 134* 115*  BUN 20 19 16 18   CREATININE 1.19 0.93 0.96 0.95  CALCIUM 8.8* 9.0 8.5* 9.0  PROT 7.0  --   --   --   ALBUMIN 3.8  --   --   --   AST 28  --   --   --   ALT 27  --   --   --   ALKPHOS 112  --   --   --   BILITOT 1.9*  --   --   --   GFRNONAA >60 >60 >60 >60  ANIONGAP 7 11 9 9      Hematology Recent Labs  Lab 06/01/21 0401 06/02/21 0355 06/03/21 0342  WBC 9.3 9.5 10.1  RBC 4.04* 3.97* 4.18*  HGB 13.7 13.1 13.7  HCT 42.7 41.5 43.4  MCV 105.7* 104.5* 103.8*  MCH 33.9 33.0 32.8  MCHC 32.1 31.6 31.6  RDW 14.4 14.4 14.3  PLT 185 168 189    Cardiac Enzymes Recent Labs  Lab 05/31/21 0841 05/31/21 1023  TROPONINIHS 22* 20*    BNP Recent Labs  Lab 05/31/21 0841 06/01/21 0401 06/02/21 0355  BNP 155.0* 147.0* 118.0*     Radiology    ECHOCARDIOGRAM COMPLETE  Result Date: 06/01/2021    ECHOCARDIOGRAM REPORT   Patient Name:   [name] Date of Exam: 06/01/2021 Medical Rec #:  [id number]          Height:       73.0 in Accession #:    [id number]         Weight:       282.6 lb Date of Birth:  [birth year]           BSA:          2.492 m Patient Age:    66 years           BP:           108/77 mmHg Patient Gender: M                  HR:           127 bpm. Exam Location:  [facility] Procedure: 2D Echo, Cardiac Doppler, Color Doppler and Intracardiac            Opacification Agent Indications:    AFlutter  History:        Patient has prior history of Echocardiogram examinations, most                 recent 02/01/2018. CHF, CAD, COPD, Arrythmias:Atrial Flutter;                 Risk Factors:Hypertension,  Dyslipidemia and Current Smoker.  Sonographer:    Wenda Low Referring Phys: [place]  1. Left ventricular ejection fraction, by estimation, is 60 to 65%. The left ventricle has normal function. The left ventricle has no regional wall motion abnormalities. There is mild left ventricular hypertrophy. Left ventricular diastolic parameters are indeterminate. There is the interventricular septum is flattened in systole and diastole, consistent with right ventricular pressure and volume overload.  2. Right ventricular systolic function is mildly reduced. The right ventricular size is mildly enlarged. There is moderately elevated pulmonary artery systolic pressure. The estimated right ventricular systolic pressure is 10.2 mmHg.  3. Left atrial size was mildly dilated.  4. Right atrial size was mildly dilated.  5. The mitral valve is grossly normal. Trivial mitral valve regurgitation.  6. Tricuspid valve regurgitation is moderate.  7. The aortic valve is tricuspid. There is moderate calcification of the aortic valve. Aortic valve regurgitation is not visualized. Aortic valve sclerosis/calcification is present, without any evidence of aortic stenosis. Aortic valve mean gradient measures 6.0 mmHg.  8. Aortic dilatation noted. There is mild dilatation of the aortic root, measuring 41 mm.  9. The inferior vena cava is dilated in size with <50% respiratory variability, suggesting right atrial pressure of 15 mmHg. Comparison(s): Prior images reviewed side by side. LVEF remains normal range. There is mild RV dysfunction with evidence of moderate pulmonary hypertension. FINDINGS  Left Ventricle: Left ventricular ejection fraction, by estimation, is 60 to 65%. The left ventricle has normal function. The left ventricle has no regional wall motion abnormalities. Definity contrast agent was given IV to delineate the left ventricular  endocardial borders. The left ventricular internal cavity size  was  normal in size. There is mild left ventricular hypertrophy. The interventricular septum is flattened in systole and diastole, consistent with right ventricular pressure and volume overload. Left ventricular diastolic parameters are indeterminate. Right Ventricle: The right ventricular size is mildly enlarged. No increase in right ventricular wall thickness. Right ventricular systolic function is mildly reduced. There is moderately elevated pulmonary artery systolic pressure. The tricuspid regurgitant velocity is 2.84 m/s, and with an assumed right atrial pressure of 15 mmHg, the estimated right ventricular systolic pressure is 93.8 mmHg. Left Atrium: Left atrial size was mildly dilated. Right Atrium: Right atrial size was mildly dilated. Pericardium: There is no evidence of pericardial effusion. Presence of epicardial fat layer. Mitral Valve: The mitral valve is grossly normal. Mild mitral annular calcification. Trivial mitral valve regurgitation. MV peak gradient, 4.6 mmHg. The mean mitral valve gradient is 3.0 mmHg. Tricuspid Valve: The tricuspid valve is grossly normal. Tricuspid valve regurgitation is moderate. Aortic Valve: The aortic valve is tricuspid. There is moderate calcification of the aortic valve. There is mild aortic valve annular calcification. Aortic valve regurgitation is not visualized. Aortic valve sclerosis/calcification is present, without any  evidence of aortic stenosis. Aortic valve mean gradient measures 6.0 mmHg. Aortic valve peak gradient measures 10.8 mmHg. Aortic valve area, by VTI measures 2.47 cm. Pulmonic Valve: The pulmonic valve was grossly normal. Pulmonic valve regurgitation is trivial. Aorta: Aortic dilatation noted. There is mild dilatation of the aortic root, measuring 41 mm. Venous: The inferior vena cava is dilated in size with less than 50% respiratory variability, suggesting right atrial pressure of 15 mmHg. IAS/Shunts: No atrial level shunt detected by color flow  Doppler.  LEFT VENTRICLE PLAX 2D LVIDd:         5.00 cm   Diastology LVIDs:         3.00 cm   LV e' medial:    10.20 cm/s LV PW:         1.20 cm   LV E/e' medial:  9.7 LV IVS:        1.20 cm   LV e' lateral:   11.20 cm/s LVOT diam:     2.00 cm   LV E/e' lateral: 8.9 LV SV:         63 LV SV Index:   25 LVOT Area:     3.14 cm  RIGHT VENTRICLE RV Basal diam:  3.95 cm RV Mid diam:    3.10 cm LEFT ATRIUM             Index        RIGHT ATRIUM           Index LA diam:        4.50 cm 1.81 cm/m   RA Area:     25.20 cm LA Vol (A2C):   80.9 ml 32.46 ml/m  RA Volume:   78.20 ml  31.38 ml/m LA Vol (A4C):   97.7 ml 39.20 ml/m LA Biplane Vol: 89.3 ml 35.83 ml/m  AORTIC VALVE                     PULMONIC VALVE AV Area (Vmax):    2.55 cm      PV Vmax:       0.80 m/s AV Area (Vmean):   2.33 cm      PV Peak grad:  2.6 mmHg AV Area (VTI):     2.47 cm AV Vmax:  164.00 cm/s AV Vmean:          109.000 cm/s AV VTI:            0.256 m AV Peak Grad:      10.8 mmHg AV Mean Grad:      6.0 mmHg LVOT Vmax:         133.00 cm/s LVOT Vmean:        80.700 cm/s LVOT VTI:          0.201 m LVOT/AV VTI ratio: 0.79  AORTA Ao Root diam: 3.70 cm Ao Asc diam:  3.40 cm MITRAL VALVE               TRICUSPID VALVE MV Area (PHT): 4.80 cm    TR Peak grad:   32.3 mmHg MV Area VTI:   4.10 cm    TR Vmax:        284.00 cm/s MV Peak grad:  4.6 mmHg MV Mean grad:  3.0 mmHg    SHUNTS MV Vmax:       1.07 m/s    Systemic VTI:  0.20 m MV Vmean:      74.4 cm/s   Systemic Diam: 2.00 cm MV Decel Time: 158 msec MV E velocity: 99.40 cm/s Rozann Lesches MD Electronically signed by Rozann Lesches MD Signature Date/Time: 06/01/2021/11:17:43 AM    Final      Assessment & Plan    1.  Persistent, atypical atrial flutter with RVR, CHA2DS2-VASc score of 4.  Duration uncertain but potentially over the last few weeks at least with associated fluid overload and weight gain.  Started on Eliquis, also IV diltiazem and Lanoxin with heart rates in the 120s,  otherwise hemodynamically stable.  Anticipate TEE guided cardioversion today.  2.  Acute diastolic heart failure.  At least 20 pound weight gain over baseline in the last few weeks.  Responding very well to IV Lasix, net output of 18 L in the last 72 hours.  Weight back down to baseline.  3.  Essential hypertension.  4.  Coronary artery calcification by CT imaging.  No ischemia by Myoview in 2019.  No recent angina symptoms.  High-sensitivity troponin I levels are flat and in the 20s, not suggestive of ACS.  Plan to reposition pulse oximeter.  Oxygen saturation low 90s this morning.  Was on BiPAP overnight.  Discussed risk of sedation in terms of respiratory depression, ideally would like to get him back in sinus rhythm today.  Continue with plan for TEE guided cardioversion.  Signed, Rozann Lesches, MD  06/03/2021, 8:29 AM

## 2021-06-03 NOTE — CV Procedure (Signed)
Transesophageal echocardiogram guided direct-current cardioversion  Indication: Persistent atypical atrial flutter  Description of procedure: After informed consent was obtained the patient was taken to the PACU where a timeout was performed.  Anterior and posterior pads were placed in standard fashion and connected to a biphasic defibrillator.  Oropharynx with anesthetized with viscous lidocaine and a bite block was placed.  Deep sedation was achieved via use of propofol per the anesthesia service, please refer to their records for dose administration and monitoring.  A multiplane transesophageal echocardiographic probe was inserted into the esophagus and multiple images were obtained.  There were no immediate complications.  Results are as follows.   1. Left ventricular ejection fraction, by estimation, is 55 to 60%. The  left ventricle has normal function. The left ventricle has no regional  wall motion abnormalities.   2. Right ventricular systolic function was not well visualized. The right  ventricular size is normal.   3. Left atrial size was moderately dilated. No left atrial/left atrial  appendage thrombus was detected. The LAA emptying velocity was 60 cm/s.   4. The mitral valve is grossly normal. Mild mitral valve regurgitation.   5. Tricuspid valve regurgitation is moderate.   6. The aortic valve is tricuspid. There is mild calcification of the  aortic valve. Aortic valve regurgitation is not visualized.   7. There is Moderate (Grade III) plaque involving the descending aorta.   Transesophageal echocardiographic probe was removed and patient placed in supine position in preparation for cardioversion.  With sandbag on the anterior chest pad, a single synchronized 120 J shock was delivered with successful restoration of sinus rhythm with occasional PACs.  There were no immediate complications.  Disposition: After appropriate postanesthesia monitoring patient will be transferred back to  the ICU for further treatment.  Satira Sark, M.D., F.A.C.C.

## 2021-06-03 NOTE — Anesthesia Postprocedure Evaluation (Signed)
Anesthesia Post Note  Patient: [name]  Procedure(s) Performed: TRANSESOPHAGEAL ECHOCARDIOGRAM (TEE) CARDIOVERSION  Patient location during evaluation: Phase II Anesthesia Type: General Level of consciousness: awake Pain management: pain level controlled Vital Signs Assessment: post-procedure vital signs reviewed and stable Respiratory status: spontaneous breathing and respiratory function stable Cardiovascular status: blood pressure returned to baseline and stable Postop Assessment: no headache and no apparent nausea or vomiting Anesthetic complications: no Comments: Late entry   No notable events documented.   Last Vitals:  Vitals:   06/03/21 1200 06/03/21 1300  BP: (!) 112/98 137/70  Pulse: 82 84  Resp: 17 18  Temp: 37.1 C   SpO2: 91% 94%    Last Pain:  Vitals:   06/03/21 1133  TempSrc:   PainSc: 0-No pain                 Louann Sjogren

## 2021-06-04 LAB — BASIC METABOLIC PANEL
Anion gap: 8 (ref 5–15)
BUN: 27 mg/dL — ABNORMAL HIGH (ref 8–23)
CO2: 36 mmol/L — ABNORMAL HIGH (ref 22–32)
Calcium: 9.1 mg/dL (ref 8.9–10.3)
Chloride: 90 mmol/L — ABNORMAL LOW (ref 98–111)
Creatinine, Ser: 1.21 mg/dL (ref 0.61–1.24)
GFR, Estimated: >60 mL/min (ref >60)
Glucose, Bld: 145 mg/dL — ABNORMAL HIGH (ref 70–99)
Potassium: 4.5 mmol/L (ref 3.5–5.1)
Sodium: 134 mmol/L — ABNORMAL LOW (ref 135–145)

## 2021-06-04 LAB — CBC
HCT: 43.4 % (ref 39.0–52.0)
Hemoglobin: 13.5 g/dL (ref 13.0–17.0)
MCH: 32.6 pg (ref 26.0–34.0)
MCHC: 31.1 g/dL (ref 30.0–36.0)
MCV: 104.8 fL — ABNORMAL HIGH (ref 80.0–100.0)
Platelets: 191 10*3/uL (ref 150–400)
RBC: 4.14 MIL/uL — ABNORMAL LOW (ref 4.22–5.81)
RDW: 14.4 % (ref 11.5–15.5)
WBC: 11.5 10*3/uL — ABNORMAL HIGH (ref 4.0–10.5)
nRBC: 0 % (ref 0.0–0.2)

## 2021-06-04 LAB — GLUCOSE, CAPILLARY
Glucose-Capillary: 192 mg/dL — ABNORMAL HIGH (ref 70–99)
Glucose-Capillary: 209 mg/dL — ABNORMAL HIGH (ref 70–99)
Glucose-Capillary: 228 mg/dL — ABNORMAL HIGH (ref 70–99)
Glucose-Capillary: 141 mg/dL — ABNORMAL HIGH (ref 70–99)

## 2021-06-04 NOTE — Progress Notes (Signed)
PROGRESS NOTE   [name]  PXT:[id number] DOB: 04-May-1956 DOA: 05/31/2021 PCP: Cory Munch, PA-C   Chief Complaint  Patient presents with   Palpitations   Level of care: Telemetry  Brief Admission History:   66 y.o. male with medical history significant chronic smoking, hypertension, hyperlipidemia, colon cancer s/p partial colectomy, kidney stones, history of endovenous laser ablation left greater saphenous who reports that he has suffers from increasing edema in his legs and feet and he has gained 25 pounds since October 2022.  He reports that he has had intermittent palpitations.  He also has increasing dyspnea symptoms but no chest pain symptoms.  He is a heavy longtime smoker and has not been able to quit.  He presented to ED after he had seen his PCP today and was noted to have an irregular heart rhythm and was desaturated to 82% on room air.     ED Course: Pt presented with atrial flutter with RVR on monitor and EKG.  He was massively volume overloaded weighing nearly 305 pounds.  He had been down to 280 about 3 months ago.  He was on 3L nasal cannula and his heart rate was 173.  He was given IV lasix and started on IV cardizem infusion.  His Hs troponin was 20.  His coronavirus and influenza testing was negative.  Admission was requested for further management.    Assessment & Plan:   Principal Problem:   Atrial flutter with rapid ventricular response (HCC) Active Problems:   Acute on chronic heart failure with preserved ejection fraction (HFpEF) /Diastolic Dysfunction CHF   Moderate pulmonary hypertension (HCC)   Tobacco abuse   Type 2 diabetes mellitus (HCC)   COPD (chronic obstructive pulmonary disease) (HCC)   Hyperlipidemia   Coronary artery calcification seen on CT scan   Hypertension   History of Colon cancer   Acute heart failure (HCC)   Hyperglycemia   Elevated brain natriuretic peptide (BNP) level   HFpEF-Acute on chronic diastolic dysfunction heart  failure  - presents was admitted with  massive volume overload - he is at least 20 pounds over baseline weight - he has pitting / weeping edema in legs and feet  - IV lasix 80 mg every 12 hours - monitor weights, electrolytes, intake and output - follow BMP - 2 gm sodium diet - elevated legs -Echo from 06/01/2021 with EF of 60 to 65% and evidence of right ventricular pressure and volume overload - continue TED hose  -Patient remains symptomatic from a CHF standpoint -DOE persist -Weaning down oxygen currently requiring 3 L down from 6 L via nasal cannula -His weight continues to trend down   Essential hypertension  -Bisoprolol and IV Lasix as ordered - BP stable, follow   Atrial flutter with RVR Doing much better after successful TEE and cardioversion on 06/03/21 - apixaban for full anticoagulation  - TSH within normal limits  - CHAD2-VASc at least 4  -Continue bisoprolol    Hyperlipidemia - lipid panel optimally controlled - continue atorvastatin    Type 2 diabetes mellitus - apparent new diagnosis  - A1c - 7.5%  - SSI coverage if needed - goal BS 140-180 in hospital, no BS >180 at this time - DC home on glucophage XR (metformin ER) to titrate up to 2 gm daily   COPD - bronchodilators reordered  - counseled to stop smoking  - nicotine patch ordered but declined   DVT prophylaxis: apixaban  Code Status: Full  Family Communication:  wife bedside update 1/11 Disposition: anticipate home  Status is: Inpatient  Remains inpatient appropriate because: Significant volume overload/CHF requiring IV lasix required,    Consultants:  Cardiology   Procedures:  Status post TEE and cardioversion 06/03/21 antimicrobials:  N/a   Subjective: DOE persist -No chest pain at rest -Eating and drinking okay -Able to wean down oxygen from 6 L to 3 L via nasal cannula today -Complains of fatigue  Objective: Vitals:   06/04/21 1208 06/04/21 1219 06/04/21 1300 06/04/21 1631  BP:    110/67 116/76  Pulse: (!) 149 72 78   Resp: (!) 23 (!) 22 19   Temp: (!) 97.4 F (36.3 C)     TempSrc: Oral     SpO2: (!) 86% 94% 91%   Weight:      Height:        Intake/Output Summary (Last 24 hours) at 06/04/2021 1849 Last data filed at 06/04/2021 1700 Gross per 24 hour  Intake 800 ml  Output 2650 ml  Net -1850 ml   Filed Weights   06/03/21 0500 06/03/21 1018 06/04/21 0600  Weight: 119.6 kg 119.6 kg 119 kg    Examination:  General exam: obese male, awake, alert, cooperative, DOE persist Nose- [place] 3L/min Respiratory system: Improving air movement, faint bibasilar rales cardiovascular system:  normal S1 & S2 heard. 1+ JVD,   2+ pedal edema. Gastrointestinal system: Abdomen is nondistended, soft and nontender.   Normal bowel sounds heard. Central nervous system: Alert and oriented. No focal neurological deficits. Extremities: 2+ pitting edema BLEs. Skin: No rashes, lesions or ulcers Psychiatry: Judgement and insight appear normal. Mood & affect appropriate.   Data Reviewed: I have personally reviewed following labs and imaging studies  CBC: Recent Labs  Lab 05/31/21 0841 06/01/21 0401 06/02/21 0355 06/03/21 0342 06/04/21 0410  WBC 8.6 9.3 9.5 10.1 11.5*  NEUTROABS 6.5  --   --   --   --   HGB 14.1 13.7 13.1 13.7 13.5  HCT 45.4 42.7 41.5 43.4 43.4  MCV 105.8* 105.7* 104.5* 103.8* 104.8*  PLT 180 185 168 189 400    Basic Metabolic Panel: Recent Labs  Lab 05/31/21 0841 06/01/21 0401 06/02/21 0355 06/03/21 0342 06/04/21 0410  NA 137 138 138 136 134*  K 4.5 4.1 3.7 3.7 4.5  CL 102 97* 94* 92* 90*  CO2 28 30 35* 35* 36*  GLUCOSE 158* 111* 134* 115* 145*  BUN 20 19 16 18  27*  CREATININE 1.19 0.93 0.96 0.95 1.21  CALCIUM 8.8* 9.0 8.5* 9.0 9.1  MG 1.8 1.7 1.8 1.7  --     GFR: Estimated Creatinine Clearance: 81.1 mL/min (by C-G formula based on SCr of 1.21 mg/dL).  Liver Function Tests: Recent Labs  Lab 05/31/21 0841  AST 28  ALT 27  ALKPHOS 112   BILITOT 1.9*  PROT 7.0  ALBUMIN 3.8    CBG: Recent Labs  Lab 06/03/21 2015 06/03/21 2225 06/04/21 0740 06/04/21 1142 06/04/21 1742  GLUCAP 174* 163* 192* 209* 228*    Recent Results (from the past 240 hour(s))  Resp Panel by RT-PCR (Flu A&B, Covid) Nasopharyngeal Swab     Status: None   Collection Time: 05/31/21  8:57 AM   Specimen: Nasopharyngeal Swab; Nasopharyngeal(NP) swabs in vial transport medium  Result Value Ref Range Status   SARS Coronavirus 2 by RT PCR NEGATIVE NEGATIVE Final    Comment: (NOTE) SARS-CoV-2 target nucleic acids are NOT DETECTED.  The SARS-CoV-2 RNA is generally detectable in  upper respiratory specimens during the acute phase of infection. The lowest concentration of SARS-CoV-2 viral copies this assay can detect is 138 copies/mL. A negative result does not preclude SARS-Cov-2 infection and should not be used as the sole basis for treatment or other patient management decisions. A negative result may occur with  improper specimen collection/handling, submission of specimen other than nasopharyngeal swab, presence of viral mutation(s) within the areas targeted by this assay, and inadequate number of viral copies(<138 copies/mL). A negative result must be combined with clinical observations, patient history, and epidemiological information. The expected result is Negative.  Fact Sheet for Patients:  EntrepreneurPulse.com.au  Fact Sheet for Healthcare Providers:  IncredibleEmployment.be  This test is no t yet approved or cleared by the [place] FDA and  has been authorized for detection and/or diagnosis of SARS-CoV-2 by FDA under an Emergency Use Authorization (EUA). This EUA will remain  in effect (meaning this test can be used) for the duration of the COVID-19 declaration under Section 564(b)(1) of the Act, 21 U.S.C.section 360bbb-3(b)(1), unless the authorization is terminated  or revoked sooner.        Influenza A by PCR NEGATIVE NEGATIVE Final   Influenza B by PCR NEGATIVE NEGATIVE Final    Comment: (NOTE) The Xpert Xpress SARS-CoV-2/FLU/RSV plus assay is intended as an aid in the diagnosis of influenza from Nasopharyngeal swab specimens and should not be used as a sole basis for treatment. Nasal washings and aspirates are unacceptable for Xpert Xpress SARS-CoV-2/FLU/RSV testing.  Fact Sheet for Patients: EntrepreneurPulse.com.au  Fact Sheet for Healthcare Providers: IncredibleEmployment.be  This test is not yet approved or cleared by the [place] FDA and has been authorized for detection and/or diagnosis of SARS-CoV-2 by FDA under an Emergency Use Authorization (EUA). This EUA will remain in effect (meaning this test can be used) for the duration of the COVID-19 declaration under Section 564(b)(1) of the Act, 21 U.S.C. section 360bbb-3(b)(1), unless the authorization is terminated or revoked.  Performed at [facility], [address], [place], [place] [address]   MRSA Next Gen by PCR, Nasal     Status: None   Collection Time: 05/31/21  3:00 PM   Specimen: Nasal Mucosa; Nasal Swab  Result Value Ref Range Status   MRSA by PCR Next Gen NOT DETECTED NOT DETECTED Final    Comment: (NOTE) The GeneXpert MRSA Assay (FDA approved for NASAL specimens only), is one component of a comprehensive MRSA colonization surveillance program. It is not intended to diagnose MRSA infection nor to guide or monitor treatment for MRSA infections. Test performance is not FDA approved in patients less than 38 years old. Performed at [facility], [address] [place], [place] [address]      Radiology Studies: ECHO TEE  Result Date: 06/03/2021    TRANSESOPHOGEAL ECHO REPORT   Patient Name:   SURYA SCHROETER Date of Exam: 06/03/2021 Medical Rec #:  [id number]          Height:       73.0 in Accession #:    [id number]         Weight:       263.7 lb  Date of Birth:  08/01/1955           BSA:          2.420 m Patient Age:    12 years           BP:           127/81 mmHg  Patient Gender: M                  HR:           126 bpm. Exam Location:  [facility] Procedure: Transesophageal Echo, Cardiac Doppler and Color Doppler Indications:    Atrial Fluuter I48.92  History:        Patient has prior history of Echocardiogram examinations, most                 recent 06/01/2021. COPD; Risk Factors:Hypertension, Diabetes,                 Dyslipidemia and Current Smoker. Hx of Colon cancer.  Sonographer:    Alvino Chapel RCS Referring Phys: [id number] [place]: TEE procedure time was 6 minutes. The transesophogeal probe was passed without difficulty through the esophogus of the patient. Imaged were obtained with the patient in a left lateral decubitus position. Local oropharyngeal anesthetic was provided with viscous lidocaine. Sedation performed by different physician. The patient was monitored while under deep sedation. Image quality was adequate. The patient developed no complications during the procedure. IMPRESSIONS  1. Left ventricular ejection fraction, by estimation, is 55 to 60%. The left ventricle has normal function. The left ventricle has no regional wall motion abnormalities.  2. Right ventricular systolic function was not well visualized. The right ventricular size is normal.  3. Left atrial size was moderately dilated. No left atrial/left atrial appendage thrombus was detected. The LAA emptying velocity was 60 cm/s.  4. The mitral valve is grossly normal. Mild mitral valve regurgitation.  5. Tricuspid valve regurgitation is moderate.  6. The aortic valve is tricuspid. There is mild calcification of the aortic valve. Aortic valve regurgitation is not visualized.  7. There is Moderate (Grade III) plaque involving the descending aorta. FINDINGS  Left Ventricle: Left ventricular ejection fraction, by estimation, is 55 to 60%. The left ventricle has  normal function. The left ventricle has no regional wall motion abnormalities. The left ventricular internal cavity size was normal in size. Right Ventricle: The right ventricular size is normal. Right vetricular wall thickness was not assessed. Right ventricular systolic function was not well visualized. Left Atrium: Left atrial size was moderately dilated. No left atrial/left atrial appendage thrombus was detected. The LAA emptying velocity was 60 cm/s. Right Atrium: Right atrial size was normal in size. Pericardium: There is no evidence of pericardial effusion. Mitral Valve: The mitral valve is grossly normal. Mild mitral valve regurgitation. Tricuspid Valve: The tricuspid valve is grossly normal. Tricuspid valve regurgitation is moderate. Aortic Valve: The aortic valve is tricuspid. There is mild calcification of the aortic valve. Aortic valve regurgitation is not visualized. Pulmonic Valve: The pulmonic valve was grossly normal. Pulmonic valve regurgitation is trivial. Aorta: The aortic root is normal in size and structure. There is moderate (Grade III) plaque involving the descending aorta. IAS/Shunts: No atrial level shunt detected by color flow Doppler. Rozann Lesches MD Electronically signed by Rozann Lesches MD Signature Date/Time: 06/03/2021/12:25:21 PM    Final     Scheduled Meds:  apixaban  5 mg Oral BID   arformoterol  15 mcg Nebulization BID   And   umeclidinium bromide  1 puff Inhalation Daily   atorvastatin  20 mg Oral QPM   bisoprolol  5 mg Oral Daily   budesonide (PULMICORT) nebulizer solution  0.25 mg Nebulization BID   Chlorhexidine Gluconate Cloth  6 each Topical Q0600   furosemide  80 mg Intravenous  Daily   insulin aspart  0-15 Units Subcutaneous TID WC   mouth rinse  15 mL Mouth Rinse BID   nicotine  21 mg Transdermal Daily   potassium chloride  10 mEq Oral BID   Continuous Infusions:   LOS: 4 days   Roxan Hockey, MD How to contact the [facility] Attending or Consulting  provider [place] or covering provider during after hours [name], for this patient?  Check the care team in [facility] and look for a) attending/consulting [facility] provider listed and b) the [facility] team listed Log into [email or web address] and use [place]'s universal password to access. If you do not have the password, please contact the hospital operator. Locate the [facility] provider you are looking for under [facility] and page to a number that you can be directly reached. If you still have difficulty reaching the provider, please page the [facility] (Director on Call) for the Hospitalists listed on amion for assistance.  06/04/2021, 6:49 PM

## 2021-06-04 NOTE — Progress Notes (Signed)
Progress Note  Patient Name: CLAUDIS GIOVANELLI Date of Encounter: 06/04/2021  Primary Cardiologist: Carlyle Dolly, MD  Subjective   Feels better overall.  No palpitations or chest pain.  Has not ambulated much as yet.  Still with oxygen requirement.  Inpatient Medications    Scheduled Meds:  apixaban  5 mg Oral BID   arformoterol  15 mcg Nebulization BID   And   umeclidinium bromide  1 puff Inhalation Daily   atorvastatin  20 mg Oral QPM   bisoprolol  5 mg Oral Daily   budesonide (PULMICORT) nebulizer solution  0.25 mg Nebulization BID   Chlorhexidine Gluconate Cloth  6 each Topical Q0600   furosemide  80 mg Intravenous Daily   insulin aspart  0-15 Units Subcutaneous TID WC   mouth rinse  15 mL Mouth Rinse BID   nicotine  21 mg Transdermal Daily   potassium chloride  10 mEq Oral BID     PRN Meds: acetaminophen **OR** acetaminophen, albuterol, bisacodyl, ondansetron **OR** ondansetron (ZOFRAN) IV, oxyCODONE, traZODone   Vital Signs    Vitals:   06/04/21 0400 06/04/21 0500 06/04/21 0600 06/04/21 0741  BP: 122/78 114/67 104/69   Pulse: 77 71 77 72  Resp: 15 17 15  (!) 25  Temp: 98 F (36.7 C)   98.1 F (36.7 C)  TempSrc: Oral   Axillary  SpO2: 99% 93% 94% 93%  Weight:   119 kg   Height:        Intake/Output Summary (Last 24 hours) at 06/04/2021 0839 Last data filed at 06/04/2021 0400 Gross per 24 hour  Intake 300 ml  Output 2600 ml  Net -2300 ml   Filed Weights   06/03/21 0500 06/03/21 1018 06/04/21 0600  Weight: 119.6 kg 119.6 kg 119 kg    Telemetry    Sinus rhythm with rare PACs, brief burst of SVT.  Personally reviewed.  ECG    An ECG dated 06/04/2021 was personally reviewed today and demonstrated:  Sinus rhythm with incomplete right bundle branch block, PVC, nonspecific T wave changes.  Physical Exam   GEN: Obese male.  No acute distress.   Neck: No JVD. Cardiac: RRR, no murmur or gallop.  Respiratory: Nonlabored.  Decreased breath sounds  without wheezing. GI: Soft, nontender, bowel sounds present. MS: Significantly improved leg edema with venous stasis. Neuro:  Nonfocal. Psych: Alert and oriented x 3. Normal affect.  Labs    Chemistry Recent Labs  Lab 05/31/21 0841 06/01/21 0401 06/02/21 0355 06/03/21 0342 06/04/21 0410  NA 137   < > 138 136 134*  K 4.5   < > 3.7 3.7 4.5  CL 102   < > 94* 92* 90*  CO2 28   < > 35* 35* 36*  GLUCOSE 158*   < > 134* 115* 145*  BUN 20   < > 16 18 27*  CREATININE 1.19   < > 0.96 0.95 1.21  CALCIUM 8.8*   < > 8.5* 9.0 9.1  PROT 7.0  --   --   --   --   ALBUMIN 3.8  --   --   --   --   AST 28  --   --   --   --   ALT 27  --   --   --   --   ALKPHOS 112  --   --   --   --   BILITOT 1.9*  --   --   --   --  GFRNONAA >60   < > >60 >60 >60  ANIONGAP 7   < > 9 9 8    < > = values in this interval not displayed.     Hematology Recent Labs  Lab 06/02/21 0355 06/03/21 0342 06/04/21 0410  WBC 9.5 10.1 11.5*  RBC 3.97* 4.18* 4.14*  HGB 13.1 13.7 13.5  HCT 41.5 43.4 43.4  MCV 104.5* 103.8* 104.8*  MCH 33.0 32.8 32.6  MCHC 31.6 31.6 31.1  RDW 14.4 14.3 14.4  PLT 168 189 191    Cardiac Enzymes Recent Labs  Lab 05/31/21 0841 05/31/21 1023  TROPONINIHS 22* 20*    BNP Recent Labs  Lab 05/31/21 0841 06/01/21 0401 06/02/21 0355  BNP 155.0* 147.0* 118.0*     Radiology    ECHO TEE  Result Date: 06/03/2021    TRANSESOPHOGEAL ECHO REPORT   Patient Name:   KEIAN ODRISCOLL Date of Exam: 06/03/2021 Medical Rec #:  [id number]          Height:       73.0 in Accession #:    [id number]         Weight:       263.7 lb Date of Birth:  1956/04/21           BSA:          2.420 m Patient Age:    66 years           BP:           127/81 mmHg Patient Gender: M                  HR:           126 bpm. Exam Location:  [facility] Procedure: Transesophageal Echo, Cardiac Doppler and Color Doppler Indications:    Atrial Fluuter I48.92  History:        Patient has prior history of Echocardiogram  examinations, most                 recent 06/01/2021. COPD; Risk Factors:Hypertension, Diabetes,                 Dyslipidemia and Current Smoker. Hx of Colon cancer.  Sonographer:    Alvino Chapel RCS Referring Phys: [id number] [place]: TEE procedure time was 6 minutes. The transesophogeal probe was passed without difficulty through the esophogus of the patient. Imaged were obtained with the patient in a left lateral decubitus position. Local oropharyngeal anesthetic was provided with viscous lidocaine. Sedation performed by different physician. The patient was monitored while under deep sedation. Image quality was adequate. The patient developed no complications during the procedure. IMPRESSIONS  1. Left ventricular ejection fraction, by estimation, is 55 to 60%. The left ventricle has normal function. The left ventricle has no regional wall motion abnormalities.  2. Right ventricular systolic function was not well visualized. The right ventricular size is normal.  3. Left atrial size was moderately dilated. No left atrial/left atrial appendage thrombus was detected. The LAA emptying velocity was 60 cm/s.  4. The mitral valve is grossly normal. Mild mitral valve regurgitation.  5. Tricuspid valve regurgitation is moderate.  6. The aortic valve is tricuspid. There is mild calcification of the aortic valve. Aortic valve regurgitation is not visualized.  7. There is Moderate (Grade III) plaque involving the descending aorta. FINDINGS  Left Ventricle: Left ventricular ejection fraction, by estimation, is 55 to 60%. The left ventricle has normal function. The left ventricle  has no regional wall motion abnormalities. The left ventricular internal cavity size was normal in size. Right Ventricle: The right ventricular size is normal. Right vetricular wall thickness was not assessed. Right ventricular systolic function was not well visualized. Left Atrium: Left atrial size was moderately dilated. No left  atrial/left atrial appendage thrombus was detected. The LAA emptying velocity was 60 cm/s. Right Atrium: Right atrial size was normal in size. Pericardium: There is no evidence of pericardial effusion. Mitral Valve: The mitral valve is grossly normal. Mild mitral valve regurgitation. Tricuspid Valve: The tricuspid valve is grossly normal. Tricuspid valve regurgitation is moderate. Aortic Valve: The aortic valve is tricuspid. There is mild calcification of the aortic valve. Aortic valve regurgitation is not visualized. Pulmonic Valve: The pulmonic valve was grossly normal. Pulmonic valve regurgitation is trivial. Aorta: The aortic root is normal in size and structure. There is moderate (Grade III) plaque involving the descending aorta. IAS/Shunts: No atrial level shunt detected by color flow Doppler. Rozann Lesches MD Electronically signed by Rozann Lesches MD Signature Date/Time: 06/03/2021/12:25:21 PM    Final      Assessment & Plan    1.  Persistent, atypical atrial flutter with RVR, CHA2DS2-VASc score of 4.  Duration uncertain but potentially over the last few weeks at least with associated fluid overload and weight gain.  Started on Eliquis, also IV diltiazem and Lanoxin with heart rates in the 120s, otherwise hemodynamically stable.  Underwent successful TEE guided cardioversion on January 12 and maintaining sinus rhythm at this time.  Now transitioned from IV diltiazem and Lanoxin to bisoprolol.  2.  Acute diastolic heart failure.  At least 20 pound weight gain over baseline in the last few weeks.  Responding very well to IV Lasix, net output of approximately 21 L during hospital stay. Weight back down to baseline.   3.  Essential hypertension.  4.  Coronary artery calcification by CT imaging.  No ischemia by Myoview in 2019.  No recent angina symptoms.  High-sensitivity troponin I levels are flat and in the 20s, not suggestive of ACS.  5.  At risk for OSA, has been using BiPAP at  nighttime.  Continue Eliquis, bisoprolol, and Lipitor.  Still on IV Lasix, will reduce dose.  Need to increase activity and determine if he will require home oxygen, I suspect he will at least in the short-term.  Needs to be seen by [facility] as an outpatient for work-up of OSA and also PFTs.  Also suspect he will need standing diuretic as an outpatient.  Anticipate discharge over the weekend, would have him follow-up with Dr. Harl Bowie or APP in the next 3-4 weeks.  Signed, Rozann Lesches, MD  06/04/2021, 8:39 AM

## 2021-06-04 NOTE — Discharge Instructions (Addendum)
1)Very low-salt diet advised 2)Weigh yourself daily, call if you gain more than 3 pounds in 1 day or more than 5 pounds in 1 week as your diuretic medications may need to be adjusted 3)Limit your Fluid  intake to no more than 60 ounces (1.8 Liters) per day 4)Avoid ibuprofen/Advil/Aleve/Motrin/Goody Powders/Naproxen/BC powders/Meloxicam/Diclofenac/Indomethacin and other Nonsteroidal anti-inflammatory medications as these will make you more likely to bleed and can cause stomach ulcers, can also cause Kidney problems.  5)you need oxygen at home at 4 L via nasal cannula continuously while awake and while asleep--- smoking or having open fires around oxygen can cause fire, significant injury and death 08/07/2022) you are taking the blood thinner apixaban/Eliquis for stroke prevention --please call if Bleeding concerns --including nosebleeds, dark stools or bright red bleeding 7)Repeat CBC and BMP Blood Test with [facility] (Primary Care Provider within 1 week)  Information on my medicine - ELIQUIS (apixaban)  This medication education was reviewed with me or my healthcare representative as part of my discharge preparation.     Why was Eliquis prescribed for you? Eliquis was prescribed for you to reduce the risk of a blood clot forming that can cause a stroke if you have a medical condition called atrial fibrillation (a type of irregular heartbeat).  What do You need to know about Eliquis ? Take your Eliquis TWICE DAILY - one tablet in the morning and one tablet in the evening with or without food. If you have difficulty swallowing the tablet whole please discuss with your pharmacist how to take the medication safely.  Take Eliquis exactly as prescribed by your doctor and DO NOT stop taking Eliquis without talking to the doctor who prescribed the medication.  Stopping may increase your risk of developing a stroke.  Refill your prescription before you run out.  After discharge, you should  have regular check-up appointments with your healthcare provider that is prescribing your Eliquis.  In the future your dose may need to be changed if your kidney function or weight changes by a significant amount or as you get older.  What do you do if you miss a dose? If you miss a dose, take it as soon as you remember on the same day and resume taking twice daily.  Do not take more than one dose of ELIQUIS at the same time to make up a missed dose.  Important Safety Information A possible side effect of Eliquis is bleeding. You should call your healthcare provider right away if you experience any of the following: Bleeding from an injury or your nose that does not stop. Unusual colored urine (red or dark brown) or unusual colored stools (red or black). Unusual bruising for unknown reasons. A serious fall or if you hit your head (even if there is no bleeding).  Some medicines may interact with Eliquis and might increase your risk of bleeding or clotting while on Eliquis. To help avoid this, consult your healthcare provider or pharmacist prior to using any new prescription or non-prescription medications, including herbals, vitamins, non-steroidal anti-inflammatory drugs (NSAIDs) and supplements.  This website has more information on Eliquis (apixaban): http://www.eliquis.com/eliquis/home  - 1)Very low-salt diet advised 2)Weigh yourself daily, call if you gain more than 3 pounds in 1 day or more than 5 pounds in 1 week as your diuretic medications may need to be adjusted 3)Limit your Fluid  intake to no more than 60 ounces (1.8 Liters) per day 4)Avoid ibuprofen/Advil/Aleve/Motrin/Goody Powders/Naproxen/BC powders/Meloxicam/Diclofenac/Indomethacin and other Nonsteroidal anti-inflammatory medications as  these will make you more likely to bleed and can cause stomach ulcers, can also cause Kidney problems.  5)you need oxygen at home at 4 L via nasal cannula continuously while awake and while  asleep--- smoking or having open fires around oxygen can cause fire, significant injury and death 08-21-22) you are taking the blood thinner apixaban/Eliquis for stroke prevention --please call if Bleeding concerns --including nosebleeds, dark stools or bright red bleeding 7)Repeat CBC and BMP Blood Test with [facility] (Primary Care Provider within 1 week)

## 2021-06-05 ENCOUNTER — Inpatient Hospital Stay (HOSPITAL_COMMUNITY): Payer: Medicare HMO

## 2021-06-05 DIAGNOSIS — R0602 Shortness of breath: Secondary | ICD-10-CM | POA: Diagnosis not present

## 2021-06-05 DIAGNOSIS — R002 Palpitations: Secondary | ICD-10-CM | POA: Diagnosis not present

## 2021-06-05 LAB — GLUCOSE, CAPILLARY
Glucose-Capillary: 150 mg/dL — ABNORMAL HIGH (ref 70–99)
Glucose-Capillary: 239 mg/dL — ABNORMAL HIGH (ref 70–99)

## 2021-06-05 LAB — BASIC METABOLIC PANEL
Anion gap: 8 (ref 5–15)
BUN: 31 mg/dL — ABNORMAL HIGH (ref 8–23)
CO2: 38 mmol/L — ABNORMAL HIGH (ref 22–32)
Calcium: 9 mg/dL (ref 8.9–10.3)
Chloride: 90 mmol/L — ABNORMAL LOW (ref 98–111)
Creatinine, Ser: 1.12 mg/dL (ref 0.61–1.24)
GFR, Estimated: >60 mL/min (ref >60)
Glucose, Bld: 137 mg/dL — ABNORMAL HIGH (ref 70–99)
Potassium: 4.8 mmol/L (ref 3.5–5.1)
Sodium: 136 mmol/L (ref 135–145)

## 2021-06-05 LAB — CBC
HCT: 43.1 % (ref 39.0–52.0)
Hemoglobin: 13.2 g/dL (ref 13.0–17.0)
MCH: 32.6 pg (ref 26.0–34.0)
MCHC: 30.6 g/dL (ref 30.0–36.0)
MCV: 106.4 fL — ABNORMAL HIGH (ref 80.0–100.0)
Platelets: 185 10*3/uL (ref 150–400)
RBC: 4.05 MIL/uL — ABNORMAL LOW (ref 4.22–5.81)
RDW: 14.1 % (ref 11.5–15.5)
WBC: 10.3 10*3/uL (ref 4.0–10.5)
nRBC: 0 % (ref 0.0–0.2)

## 2021-06-05 MED ORDER — ATORVASTATIN CALCIUM 20 MG PO TABS
20.0000 mg | ORAL_TABLET | Freq: Every evening | ORAL | 3 refills | Status: AC
Start: 2021-06-05 — End: ?

## 2021-06-05 MED ORDER — POTASSIUM CHLORIDE ER 10 MEQ PO TBCR: 10.0000 meq | EXTENDED_RELEASE_TABLET | Freq: Every day | ORAL | 2 refills | Status: AC

## 2021-06-05 MED ORDER — TORSEMIDE 40 MG PO TABS: 40.0000 mg | ORAL_TABLET | Freq: Every morning | ORAL | 2 refills | Status: AC

## 2021-06-05 MED ORDER — NICOTINE 21 MG/24HR TD PT24: 21.0000 mg | MEDICATED_PATCH | Freq: Every day | TRANSDERMAL | 0 refills | Status: DC

## 2021-06-05 MED ORDER — APIXABAN 5 MG PO TABS: 5.0000 mg | ORAL_TABLET | Freq: Two times a day (BID) | ORAL | 2 refills | Status: AC

## 2021-06-05 MED ORDER — ALBUTEROL SULFATE HFA 108 (90 BASE) MCG/ACT IN AERS: 2.0000 | INHALATION_SPRAY | Freq: Four times a day (QID) | RESPIRATORY_TRACT | 2 refills | Status: DC | PRN

## 2021-06-05 MED ORDER — SYMBICORT 160-4.5 MCG/ACT IN AERO: 2.0000 | INHALATION_SPRAY | Freq: Two times a day (BID) | RESPIRATORY_TRACT | 5 refills | Status: DC

## 2021-06-05 MED ORDER — ACETAMINOPHEN 325 MG PO TABS: 650.0000 mg | ORAL_TABLET | Freq: Four times a day (QID) | ORAL | 0 refills | Status: AC | PRN

## 2021-06-05 MED ORDER — BREZTRI AEROSPHERE 160-9-4.8 MCG/ACT IN AERO: 2.0000 | INHALATION_SPRAY | Freq: Every day | RESPIRATORY_TRACT | 3 refills | Status: DC

## 2021-06-05 MED ORDER — ALBUTEROL SULFATE (2.5 MG/3ML) 0.083% IN NEBU: 3.0000 mL | INHALATION_SOLUTION | Freq: Four times a day (QID) | RESPIRATORY_TRACT | 12 refills | Status: DC | PRN

## 2021-06-05 MED ORDER — GLIMEPIRIDE 2 MG PO TABS: 2.0000 mg | ORAL_TABLET | ORAL | 11 refills | Status: AC

## 2021-06-05 MED ORDER — BISOPROLOL FUMARATE 5 MG PO TABS: 5.0000 mg | ORAL_TABLET | Freq: Every day | ORAL | 5 refills | Status: DC

## 2021-06-05 NOTE — TOC Transition Note (Signed)
Transition of Care [facility]) - CM/SW Discharge Note   Patient Details  Name: [name] MRN: [id number] Date of Birth: 11/29/1955  [facility] CM/SW Contact:  Shade Flood, LCSW Phone Number: 06/05/2021, 10:55 AM   Clinical Narrative:     Pt stable for dc today per MD. Pt with orders for O2 and neb machine. Spoke with pt to review dc planning. CMS DME provider options reviewed. Referred to [facility] as requested. Portable tank will be delivered to the room for dc and concentrator and neb machine will be delivered to pt's home later today. Pt aware.  Updated Sarah at [facility] that pt dc today. [facility] will follow for [facility].  There are no other TOC needs for dc.  Final next level of care: [place] Barriers to Discharge: Barriers Resolved   Patient Goals and CMS Choice Patient states their goals for this hospitalization and ongoing recovery are:: to go home. CMS Medicare.gov Compare Post Acute Care list provided to:: Patient Choice offered to / list presented to : Patient  Discharge Placement                       Discharge Plan and Services In-house Referral: Clinical Social Work   Post Acute Care Choice: Durable Medical Equipment          DME Arranged: Oxygen, Nebulizer machine DME Agency: [facility] Date DME Agency Contacted: 06/05/21   Representative spoke with at DME Agency: Leatrice Jewels HH Arranged: RN [place] Agency: [place] Date [place]: 06/01/21 Time [place]: 1154 Representative spoke with at [place]: [place] ([place]) Interventions     Readmission Risk Interventions Readmission Risk Prevention Plan 06/01/2021  Post Dischage Appt Complete  Medication Screening Complete  Transportation Screening Complete  Some recent data might be hidden

## 2021-06-05 NOTE — Discharge Summary (Signed)
[name], is a 66 y.o. male  DOB 1955-09-08  MRN [id number].  Admission date:  05/31/2021  Admitting Physician  Murlean Iba, MD  Discharge Date:  06/05/2021   Primary MD  Cory Munch, PA-C  Recommendations for primary care physician for things to follow:  1)Very low-salt diet advised 2)Weigh yourself daily, call if you gain more than 3 pounds in 1 day or more than 5 pounds in 1 week as your diuretic medications may need to be adjusted 3)Limit your Fluid  intake to no more than 60 ounces (1.8 Liters) per day 4)Avoid ibuprofen/Advil/Aleve/Motrin/Goody Powders/Naproxen/BC powders/Meloxicam/Diclofenac/Indomethacin and other Nonsteroidal anti-inflammatory medications as these will make you more likely to bleed and can cause stomach ulcers, can also cause Kidney problems.  5)you need oxygen at home at 4 L via nasal cannula continuously while awake and while asleep--- smoking or having open fires around oxygen can cause fire, significant injury and death 08/05/22) you are taking the blood thinner apixaban/Eliquis for stroke prevention --please call if Bleeding concerns --including nosebleeds, dark stools or bright red bleeding 7)Repeat CBC and BMP Blood Test with Cory Munch, PA-C (Primary Care Provider within 1 week)   Admission Diagnosis  Atrial flutter with rapid ventricular response [facility]) [I48.92]   Discharge Diagnosis  Atrial flutter with rapid ventricular response ([place]) [I48.92]    Principal Problem:   Atrial flutter with rapid ventricular response (HCC) Active Problems:   Acute on chronic heart failure with preserved ejection fraction (HFpEF) /Diastolic Dysfunction CHF   Moderate pulmonary hypertension (HCC)   Tobacco abuse   Type 2 diabetes mellitus (HCC)   COPD (chronic obstructive pulmonary disease) ([place])   Hyperlipidemia   Coronary artery calcification seen on CT scan   Hypertension    History of Colon cancer   Acute heart failure (HCC)   Hyperglycemia   Elevated brain natriuretic peptide (BNP) level      Past Medical History:  Diagnosis Date   Colon cancer ([place]) [place] 2014   Hypertension    Renal disorder    Kidney stones    Past Surgical History:  Procedure Laterality Date   COLONOSCOPY N/A 04/29/2013   Procedure: COLONOSCOPY;  Surgeon: Danie Binder, MD;  Location: [facility];  Service: Endoscopy;  Laterality: N/A;  10:30AM   Cyst removed from left elbow     ENDOVENOUS ABLATION SAPHENOUS VEIN W/ LASER Left 01/16/2019   endovenous laser ablation left greater saphenous vein by Ruta Hinds MD   KIDNEY STONE SURGERY     LITHOTRIPSY     PARTIAL COLECTOMY       HPI  from the history and physical done on the day of admission:    Chief Complaint: SOB and palpitations    HPI: [name] is a 66 y.o. male with medical history significant chronic smoking, hypertension, hyperlipidemia, colon cancer s/p partial colectomy, kidney stones, history of endovenous laser ablation left greater saphenous who reports that he has suffers from increasing edema in his legs and feet and he has  gained 25 pounds since October 2022.  He reports that he has had intermittent palpitations.  He also has increasing dyspnea symptoms but no chest pain symptoms.  He is a heavy longtime smoker and has not been able to quit.  He presented to ED after he had seen his PCP today and was noted to have an irregular heart rhythm and was desaturated to 82% on room air.     ED Course: Pt presented with atrial flutter with RVR on monitor and EKG.  He was massively volume overloaded weighing nearly 305 pounds.  He had been down to 280 about 3 months ago.  He was on 3L nasal cannula and his heart rate was 173.  He was given IV lasix and started on IV cardizem infusion.  His Hs troponin was 20.  His coronavirus and influenza testing was negative.  Admission was requested for further management.       Hospital Course:     A/p 1)HFpEF- Acute on chronic diastolic dysfunction heart failure exacerbation - presents was admitted with  massive volume overload - he is at least 20 pounds over baseline weight - he has pitting / weeping edema in legs and feet  - diuresed aggressively with IV Lasix --Echo from 06/01/2021 with EF of 60 to 65% and evidence of right ventricular pressure and volume overload -Dyspnea on exertion improved -Weaning down oxygen currently requiring 4 L down from 6 L via nasal cannula -His weight continues to trend down -Discharged home on p.o. torsemide and potassium   Essential hypertension  --Continue bisoprolol, as well as torsemide   Atrial flutter with RVR Doing much better after successful TEE and cardioversion on 06/03/21 - apixaban for full anticoagulation  - TSH within normal limits  - CHAD2-VASc at least 4  -Continue bisoprolol     Hyperlipidemia - lipid panel optimally controlled - continue atorvastatin    Type 2 diabetes mellitus - apparent new diagnosis  - A1c - 7.5%  -Amaryl with meals   COPD - bronchodilators reordered  - counseled to stop smoking  - nicotine patch ordered but declined    Code Status: Full  Family Communication: wife bedside update   Disposition:   home with home oxygen   Consultants:  Cardiology    Procedures:  Status post TEE and cardioversion 06/03/21 antimicrobials:  N/a   Discharge Condition: stable  Follow UP   Follow-up Information     [facility] Follow up.   Why: RN will call to schedule your first home visit.        Cory Munch, PA-C. Schedule an appointment as soon as possible for a visit.   Specialties: Physician Assistant, Internal Medicine Contact information: [place] [address] [phone]         Arnoldo Lenis, MD .   Specialty: Cardiology Contact information: [address] [place] [address] [phone]                  Diet and Activity recommendation:  As advised  Discharge Instructions   Discharge Instructions     Amb referral to [facility]   Complete by: As directed    Call MD for:  difficulty breathing, headache or visual disturbances   Complete by: As directed    Call MD for:  persistant dizziness or light-headedness   Complete by: As directed    Call MD for:  persistant nausea and vomiting   Complete by: As directed  Call MD for:  temperature >100.4   Complete by: As directed    Diet - low sodium heart healthy   Complete by: As directed    Discharge instructions   Complete by: As directed    1)Very low-salt diet advised 2)Weigh yourself daily, call if you gain more than 3 pounds in 1 day or more than 5 pounds in 1 week as your diuretic medications may need to be adjusted 3)Limit your Fluid  intake to no more than 60 ounces (1.8 Liters) per day 4)Avoid ibuprofen/Advil/Aleve/Motrin/Goody Powders/Naproxen/BC powders/Meloxicam/Diclofenac/Indomethacin and other Nonsteroidal anti-inflammatory medications as these will make you more likely to bleed and can cause stomach ulcers, can also cause Kidney problems.  5)you need oxygen at home at 4 L via nasal cannula continuously while awake and while asleep--- smoking or having open fires around oxygen can cause fire, significant injury and death 08-16-2022) you are taking the blood thinner apixaban/Eliquis for stroke prevention --please call if Bleeding concerns --including nosebleeds, dark stools or bright red bleeding 7)Repeat CBC and BMP Blood Test with Cory Munch, PA-C (Primary Care Provider within 1 week)   Increase activity slowly   Complete by: As directed          Discharge Medications     Allergies as of 06/05/2021       Reactions   Penicillins Other (See Comments)   Childhood Allergy  Has patient had a PCN reaction causing immediate rash, facial/tongue/throat swelling, SOB or lightheadedness with hypotension: No Has  patient had a PCN reaction causing severe rash involving mucus membranes or skin necrosis: No Has patient had a PCN reaction that required hospitalization: No Has patient had a PCN reaction occurring within the last 10 years: No If all of the above answers are "NO", then may proceed with Cephalosporin use.        Medication List     STOP taking these medications    aspirin 81 MG EC tablet   furosemide 20 MG tablet Commonly known as: LASIX   ibuprofen 200 MG tablet Commonly known as: ADVIL       TAKE these medications    acetaminophen 325 MG tablet Commonly known as: TYLENOL Take 2 tablets (650 mg total) by mouth every 6 (six) hours as needed for mild pain (or Fever >/= 101).   albuterol 108 (90 Base) MCG/ACT inhaler Commonly known as: VENTOLIN HFA Inhale 2 puffs into the lungs every 6 (six) hours as needed for wheezing or shortness of breath. What changed: Another medication with the same name was added. Make sure you understand how and when to take each.   albuterol (2.5 MG/3ML) 0.083% nebulizer solution Commonly known as: PROVENTIL Take 3 mLs by nebulization every 6 (six) hours as needed for wheezing or shortness of breath. What changed: You were already taking a medication with the same name, and this prescription was added. Make sure you understand how and when to take each.   apixaban 5 MG Tabs tablet Commonly known as: ELIQUIS Take 1 tablet (5 mg total) by mouth 2 (two) times daily.   atorvastatin 20 MG tablet Commonly known as: LIPITOR Take 1 tablet (20 mg total) by mouth every evening. What changed: when to take this   bisoprolol 5 MG tablet Commonly known as: ZEBETA Take 1 tablet (5 mg total) by mouth daily. For BP and Heart Rate Control Start taking on: June 06, 2021   Breztri Aerosphere 160-9-4.8 MCG/ACT Aero Generic drug: Budeson-Glycopyrrol-Formoterol Inhale 2 puffs into the lungs  daily.   glimepiride 2 MG tablet Commonly known as:  Amaryl Take 1 tablet (2 mg total) by mouth every morning.   MULTI-VITAMIN DAILY PO Take 1 tablet by mouth daily.   nicotine 21 mg/24hr patch Commonly known as: NICODERM CQ - dosed in mg/24 hours Place 1 patch (21 mg total) onto the skin daily. Start taking on: June 06, 2021   potassium chloride 10 MEQ tablet Commonly known as: KLOR-CON Take 1 tablet (10 mEq total) by mouth daily. Take While taking Lasix/furosemide   Symbicort 160-4.5 MCG/ACT inhaler Generic drug: budesonide-formoterol Inhale 2 puffs into the lungs 2 (two) times daily.   Torsemide 40 MG Tabs Take 40 mg by mouth every morning.               Durable Medical Equipment  (From admission, onward)           Start     Ordered   06/05/21 1018  For home use only DME Nebulizer machine  Once       Question Answer Comment  Patient needs a nebulizer to treat with the following condition Respiratory failure with hypoxia ([place])   Length of Need Lifetime      06/05/21 1017   06/05/21 1008  For home use only DME oxygen  Once       Comments: SATURATION QUALIFICATIONS: (This note is used to comply with regulatory documentation for home oxygen)   Patient Saturations on Room Air at Rest = 89%   Patient Saturations on Room Air while Ambulating = 86 %   Patient Saturations on 4 Liters of oxygen while Ambulating = 92%     Patient Saturations on 4 Liters of oxygen while Ambulating = 92%    Patient needs continuous O2 at 4 L/min continuously via nasal cannula with humidifier, with gaseous portability and conserving device  Question Answer Comment  Length of Need Lifetime   Mode or (Route) Nasal cannula   Liters per Minute 4   Frequency Continuous (stationary and portable oxygen unit needed)   Oxygen conserving device Yes   Oxygen delivery system Gas      06/05/21 1014            Major procedures and Radiology Reports - PLEASE review detailed and final reports for all details, in brief    DG Chest 2  View  Result Date: 06/05/2021 CLINICAL DATA:  Shortness of breath and palpitations. EXAM: CHEST - 2 VIEW COMPARISON:  05/31/2021 and older studies. FINDINGS: Bilateral irregular interstitial thickening is without significant change from the prior exam. Hazy airspace opacity is noted in the left lower lung, mildly increased from the prior exam. Possible small left effusion. No pneumothorax. IMPRESSION: 1. Mild increase in airspace opacities at the left lung base. Persistent bilateral irregular interstitial thickening. Findings may reflect asymmetric edema or infection. Electronically Signed   By: Lajean Manes M.D.   On: 06/05/2021 10:08   DG Chest Portable 1 View  Result Date: 05/31/2021 CLINICAL DATA:  sob EXAM: PORTABLE CHEST 1 VIEW COMPARISON:  01/31/2018. FINDINGS: Low lung volumes with mildly increased conspicuity of left basilar opacities. Suspected small left pleural effusion. No visible pneumothorax. Cardiomediastinal silhouette is accentuated by portable AP technique. IMPRESSION: Low lung volumes with mildly increased conspicuity of left basilar opacities, which could represent atelectasis or pneumonia. Suspected small left pleural effusion. Dedicated PA and lateral radiographs could further evaluate if clinically indicated. Electronically Signed   By: Margaretha Sheffield M.D.   On: 05/31/2021 08:58  ECHOCARDIOGRAM COMPLETE  Result Date: 06/01/2021    ECHOCARDIOGRAM REPORT   Patient Name:   [name] Date of Exam: 06/01/2021 Medical Rec #:  [id number]          Height:       73.0 in Accession #:    [id number]         Weight:       282.6 lb Date of Birth:  Aug 12, 1955           BSA:          2.492 m Patient Age:    78 years           BP:           108/77 mmHg Patient Gender: M                  HR:           127 bpm. Exam Location:  [facility] Procedure: 2D Echo, Cardiac Doppler, Color Doppler and Intracardiac            Opacification Agent Indications:    AFlutter  History:        Patient has  prior history of Echocardiogram examinations, most                 recent 02/01/2018. CHF, CAD, COPD, Arrythmias:Atrial Flutter;                 Risk Factors:Hypertension, Dyslipidemia and Current Smoker.  Sonographer:    Wenda Low Referring Phys: [place]  1. Left ventricular ejection fraction, by estimation, is 60 to 65%. The left ventricle has normal function. The left ventricle has no regional wall motion abnormalities. There is mild left ventricular hypertrophy. Left ventricular diastolic parameters are indeterminate. There is the interventricular septum is flattened in systole and diastole, consistent with right ventricular pressure and volume overload.  2. Right ventricular systolic function is mildly reduced. The right ventricular size is mildly enlarged. There is moderately elevated pulmonary artery systolic pressure. The estimated right ventricular systolic pressure is 70.3 mmHg.  3. Left atrial size was mildly dilated.  4. Right atrial size was mildly dilated.  5. The mitral valve is grossly normal. Trivial mitral valve regurgitation.  6. Tricuspid valve regurgitation is moderate.  7. The aortic valve is tricuspid. There is moderate calcification of the aortic valve. Aortic valve regurgitation is not visualized. Aortic valve sclerosis/calcification is present, without any evidence of aortic stenosis. Aortic valve mean gradient measures 6.0 mmHg.  8. Aortic dilatation noted. There is mild dilatation of the aortic root, measuring 41 mm.  9. The inferior vena cava is dilated in size with <50% respiratory variability, suggesting right atrial pressure of 15 mmHg. Comparison(s): Prior images reviewed side by side. LVEF remains normal range. There is mild RV dysfunction with evidence of moderate pulmonary hypertension. FINDINGS  Left Ventricle: Left ventricular ejection fraction, by estimation, is 60 to 65%. The left ventricle has normal function. The left ventricle has no  regional wall motion abnormalities. Definity contrast agent was given IV to delineate the left ventricular  endocardial borders. The left ventricular internal cavity size was normal in size. There is mild left ventricular hypertrophy. The interventricular septum is flattened in systole and diastole, consistent with right ventricular pressure and volume overload. Left ventricular diastolic parameters are indeterminate. Right Ventricle: The right ventricular size is mildly enlarged. No increase in right ventricular wall thickness. Right ventricular systolic function is mildly reduced. There  is moderately elevated pulmonary artery systolic pressure. The tricuspid regurgitant velocity is 2.84 m/s, and with an assumed right atrial pressure of 15 mmHg, the estimated right ventricular systolic pressure is 23.5 mmHg. Left Atrium: Left atrial size was mildly dilated. Right Atrium: Right atrial size was mildly dilated. Pericardium: There is no evidence of pericardial effusion. Presence of epicardial fat layer. Mitral Valve: The mitral valve is grossly normal. Mild mitral annular calcification. Trivial mitral valve regurgitation. MV peak gradient, 4.6 mmHg. The mean mitral valve gradient is 3.0 mmHg. Tricuspid Valve: The tricuspid valve is grossly normal. Tricuspid valve regurgitation is moderate. Aortic Valve: The aortic valve is tricuspid. There is moderate calcification of the aortic valve. There is mild aortic valve annular calcification. Aortic valve regurgitation is not visualized. Aortic valve sclerosis/calcification is present, without any  evidence of aortic stenosis. Aortic valve mean gradient measures 6.0 mmHg. Aortic valve peak gradient measures 10.8 mmHg. Aortic valve area, by VTI measures 2.47 cm. Pulmonic Valve: The pulmonic valve was grossly normal. Pulmonic valve regurgitation is trivial. Aorta: Aortic dilatation noted. There is mild dilatation of the aortic root, measuring 41 mm. Venous: The inferior vena  cava is dilated in size with less than 50% respiratory variability, suggesting right atrial pressure of 15 mmHg. IAS/Shunts: No atrial level shunt detected by color flow Doppler.  LEFT VENTRICLE PLAX 2D LVIDd:         5.00 cm   Diastology LVIDs:         3.00 cm   LV e' medial:    10.20 cm/s LV PW:         1.20 cm   LV E/e' medial:  9.7 LV IVS:        1.20 cm   LV e' lateral:   11.20 cm/s LVOT diam:     2.00 cm   LV E/e' lateral: 8.9 LV SV:         63 LV SV Index:   25 LVOT Area:     3.14 cm  RIGHT VENTRICLE RV Basal diam:  3.95 cm RV Mid diam:    3.10 cm LEFT ATRIUM             Index        RIGHT ATRIUM           Index LA diam:        4.50 cm 1.81 cm/m   RA Area:     25.20 cm LA Vol (A2C):   80.9 ml 32.46 ml/m  RA Volume:   78.20 ml  31.38 ml/m LA Vol (A4C):   97.7 ml 39.20 ml/m LA Biplane Vol: 89.3 ml 35.83 ml/m  AORTIC VALVE                     PULMONIC VALVE AV Area (Vmax):    2.55 cm      PV Vmax:       0.80 m/s AV Area (Vmean):   2.33 cm      PV Peak grad:  2.6 mmHg AV Area (VTI):     2.47 cm AV Vmax:           164.00 cm/s AV Vmean:          109.000 cm/s AV VTI:            0.256 m AV Peak Grad:      10.8 mmHg AV Mean Grad:      6.0 mmHg LVOT Vmax:  133.00 cm/s LVOT Vmean:        80.700 cm/s LVOT VTI:          0.201 m LVOT/AV VTI ratio: 0.79  AORTA Ao Root diam: 3.70 cm Ao Asc diam:  3.40 cm MITRAL VALVE               TRICUSPID VALVE MV Area (PHT): 4.80 cm    TR Peak grad:   32.3 mmHg MV Area VTI:   4.10 cm    TR Vmax:        284.00 cm/s MV Peak grad:  4.6 mmHg MV Mean grad:  3.0 mmHg    SHUNTS MV Vmax:       1.07 m/s    Systemic VTI:  0.20 m MV Vmean:      74.4 cm/s   Systemic Diam: 2.00 cm MV Decel Time: 158 msec MV E velocity: 99.40 cm/s Rozann Lesches MD Electronically signed by Rozann Lesches MD Signature Date/Time: 06/01/2021/11:17:43 AM    Final    ECHO TEE  Result Date: 06/03/2021    TRANSESOPHOGEAL ECHO REPORT   Patient Name:   [name] Date of Exam: 06/03/2021 Medical  Rec #:  [id number]          Height:       73.0 in Accession #:    [id number]         Weight:       263.7 lb Date of Birth:  Sep 05, 1955           BSA:          2.420 m Patient Age:    22 years           BP:           127/81 mmHg Patient Gender: M                  HR:           126 bpm. Exam Location:  [facility] Procedure: Transesophageal Echo, Cardiac Doppler and Color Doppler Indications:    Atrial Fluuter I48.92  History:        Patient has prior history of Echocardiogram examinations, most                 recent 06/01/2021. COPD; Risk Factors:Hypertension, Diabetes,                 Dyslipidemia and Current Smoker. Hx of Colon cancer.  Sonographer:    Alvino Chapel RCS Referring Phys: [id number] [place]: TEE procedure time was 6 minutes. The transesophogeal probe was passed without difficulty through the esophogus of the patient. Imaged were obtained with the patient in a left lateral decubitus position. Local oropharyngeal anesthetic was provided with viscous lidocaine. Sedation performed by different physician. The patient was monitored while under deep sedation. Image quality was adequate. The patient developed no complications during the procedure. IMPRESSIONS  1. Left ventricular ejection fraction, by estimation, is 55 to 60%. The left ventricle has normal function. The left ventricle has no regional wall motion abnormalities.  2. Right ventricular systolic function was not well visualized. The right ventricular size is normal.  3. Left atrial size was moderately dilated. No left atrial/left atrial appendage thrombus was detected. The LAA emptying velocity was 60 cm/s.  4. The mitral valve is grossly normal. Mild mitral valve regurgitation.  5. Tricuspid valve regurgitation is moderate.  6. The aortic valve is tricuspid. There is mild calcification of the aortic  valve. Aortic valve regurgitation is not visualized.  7. There is Moderate (Grade III) plaque involving the descending aorta. FINDINGS   Left Ventricle: Left ventricular ejection fraction, by estimation, is 55 to 60%. The left ventricle has normal function. The left ventricle has no regional wall motion abnormalities. The left ventricular internal cavity size was normal in size. Right Ventricle: The right ventricular size is normal. Right vetricular wall thickness was not assessed. Right ventricular systolic function was not well visualized. Left Atrium: Left atrial size was moderately dilated. No left atrial/left atrial appendage thrombus was detected. The LAA emptying velocity was 60 cm/s. Right Atrium: Right atrial size was normal in size. Pericardium: There is no evidence of pericardial effusion. Mitral Valve: The mitral valve is grossly normal. Mild mitral valve regurgitation. Tricuspid Valve: The tricuspid valve is grossly normal. Tricuspid valve regurgitation is moderate. Aortic Valve: The aortic valve is tricuspid. There is mild calcification of the aortic valve. Aortic valve regurgitation is not visualized. Pulmonic Valve: The pulmonic valve was grossly normal. Pulmonic valve regurgitation is trivial. Aorta: The aortic root is normal in size and structure. There is moderate (Grade III) plaque involving the descending aorta. IAS/Shunts: No atrial level shunt detected by color flow Doppler. Rozann Lesches MD Electronically signed by Rozann Lesches MD Signature Date/Time: 06/03/2021/12:25:21 PM    Final     Micro Results  Recent Results (from the past 240 hour(s))  Resp Panel by RT-PCR (Flu A&B, Covid) Nasopharyngeal Swab     Status: None   Collection Time: 05/31/21  8:57 AM   Specimen: Nasopharyngeal Swab; Nasopharyngeal(NP) swabs in vial transport medium  Result Value Ref Range Status   SARS Coronavirus 2 by RT PCR NEGATIVE NEGATIVE Final    Comment: (NOTE) SARS-CoV-2 target nucleic acids are NOT DETECTED.  The SARS-CoV-2 RNA is generally detectable in upper respiratory specimens during the acute phase of infection. The  lowest concentration of SARS-CoV-2 viral copies this assay can detect is 138 copies/mL. A negative result does not preclude SARS-Cov-2 infection and should not be used as the sole basis for treatment or other patient management decisions. A negative result may occur with  improper specimen collection/handling, submission of specimen other than nasopharyngeal swab, presence of viral mutation(s) within the areas targeted by this assay, and inadequate number of viral copies(<138 copies/mL). A negative result must be combined with clinical observations, patient history, and epidemiological information. The expected result is Negative.  Fact Sheet for Patients:  EntrepreneurPulse.com.au  Fact Sheet for Healthcare Providers:  IncredibleEmployment.be  This test is no t yet approved or cleared by the [place] FDA and  has been authorized for detection and/or diagnosis of SARS-CoV-2 by FDA under an Emergency Use Authorization (EUA). This EUA will remain  in effect (meaning this test can be used) for the duration of the COVID-19 declaration under Section 564(b)(1) of the Act, 21 U.S.C.section 360bbb-3(b)(1), unless the authorization is terminated  or revoked sooner.       Influenza A by PCR NEGATIVE NEGATIVE Final   Influenza B by PCR NEGATIVE NEGATIVE Final    Comment: (NOTE) The Xpert Xpress SARS-CoV-2/FLU/RSV plus assay is intended as an aid in the diagnosis of influenza from Nasopharyngeal swab specimens and should not be used as a sole basis for treatment. Nasal washings and aspirates are unacceptable for Xpert Xpress SARS-CoV-2/FLU/RSV testing.  Fact Sheet for Patients: EntrepreneurPulse.com.au  Fact Sheet for Healthcare Providers: IncredibleEmployment.be  This test is not yet approved or cleared by the [place] and  has been authorized for detection and/or diagnosis of SARS-CoV-2 by FDA under  an Emergency Use Authorization (EUA). This EUA will remain in effect (meaning this test can be used) for the duration of the COVID-19 declaration under Section 564(b)(1) of the Act, 21 U.S.C. section 360bbb-3(b)(1), unless the authorization is terminated or revoked.  Performed at [facility], [address], [place], [place] [address]   MRSA Next Gen by PCR, Nasal     Status: None   Collection Time: 05/31/21  3:00 PM   Specimen: Nasal Mucosa; Nasal Swab  Result Value Ref Range Status   MRSA by PCR Next Gen NOT DETECTED NOT DETECTED Final    Comment: (NOTE) The GeneXpert MRSA Assay (FDA approved for NASAL specimens only), is one component of a comprehensive MRSA colonization surveillance program. It is not intended to diagnose MRSA infection nor to guide or monitor treatment for MRSA infections. Test performance is not FDA approved in patients less than 43 years old. Performed at [facility], [address] [place], [place] [address]        Today   Subjective    [name] today has no new complaints  No fever  Or chills   No Nausea, Vomiting or Diarrhea        Patient has been seen and examined prior to discharge   Objective   Blood pressure 106/77, pulse 75, temperature 98.2 F (36.8 C), temperature source Oral, resp. rate 18, height 6\' 1"  (1.854 m), weight 117.5 kg, SpO2 92 %.   Intake/Output Summary (Last 24 hours) at 06/05/2021 1408 Last data filed at 06/05/2021 0825 Gross per 24 hour  Intake 440 ml  Output 1400 ml  Net -960 ml   Exam Gen:- Awake Alert, no acute distress  HEENT:- [place].AT, No sclera icterus Nose-[place] 4L/min Neck-Supple Neck,No JVD,.  Lungs-improved air movement, no wheezing  CV- S1, S2 normal, regular Abd-  +ve B.Sounds, Abd Soft, No tenderness,    Extremity/Skin:-Improved edema,   good pulses Psych-affect is appropriate, oriented x3 Neuro-no new focal deficits, no tremors    Data Review   CBC w Diff:  Lab Results  Component  Value Date   WBC 10.3 06/05/2021   HGB 13.2 06/05/2021   HCT 43.1 06/05/2021   PLT 185 06/05/2021   LYMPHOPCT 14 05/31/2021   MONOPCT 10 05/31/2021   EOSPCT 0 05/31/2021   BASOPCT 1 05/31/2021    CMP:  Lab Results  Component Value Date   NA 136 06/05/2021   K 4.8 06/05/2021   CL 90 (L) 06/05/2021   CO2 38 (H) 06/05/2021   BUN 31 (H) 06/05/2021   CREATININE 1.12 06/05/2021   PROT 7.0 05/31/2021   ALBUMIN 3.8 05/31/2021   BILITOT 1.9 (H) 05/31/2021   ALKPHOS 112 05/31/2021   AST 28 05/31/2021   ALT 27 05/31/2021  .   Total Discharge time is about 33 minutes  Roxan Hockey M.D on 06/05/2021 at 2:08 PM  Go to [email or web address] -  for contact info  [facility] - Office  [phone]

## 2021-06-05 NOTE — Progress Notes (Signed)
Patient arrived on the unit at 61. A&Ox4 denied pain or discomfort. Educated on POC and safety plan. We continue to monitor.

## 2021-06-05 NOTE — Progress Notes (Signed)
SATURATION QUALIFICATIONS: (This note is used to comply with regulatory documentation for home oxygen)  Patient Saturations on Room Air at Rest = 89%  Patient Saturations on Room Air while Ambulating = 86%  Patient Saturations on 4 Liters of oxygen while Ambulating = 92%  Please briefly explain why patient needs home oxygen: patient drops down to 86% when ambulating without oxygen.

## 2021-06-07 ENCOUNTER — Encounter (HOSPITAL_COMMUNITY): Payer: Self-pay | Admitting: Cardiology

## 2021-06-07 DIAGNOSIS — I2510 Atherosclerotic heart disease of native coronary artery without angina pectoris: Secondary | ICD-10-CM | POA: Diagnosis not present

## 2021-06-07 DIAGNOSIS — J449 Chronic obstructive pulmonary disease, unspecified: Secondary | ICD-10-CM | POA: Diagnosis not present

## 2021-06-07 DIAGNOSIS — Z9181 History of falling: Secondary | ICD-10-CM | POA: Diagnosis not present

## 2021-06-07 DIAGNOSIS — R69 Illness, unspecified: Secondary | ICD-10-CM | POA: Diagnosis not present

## 2021-06-07 DIAGNOSIS — I4892 Unspecified atrial flutter: Secondary | ICD-10-CM | POA: Diagnosis not present

## 2021-06-07 DIAGNOSIS — E1165 Type 2 diabetes mellitus with hyperglycemia: Secondary | ICD-10-CM | POA: Diagnosis not present

## 2021-06-07 DIAGNOSIS — I110 Hypertensive heart disease with heart failure: Secondary | ICD-10-CM | POA: Diagnosis not present

## 2021-06-07 DIAGNOSIS — Z9981 Dependence on supplemental oxygen: Secondary | ICD-10-CM | POA: Diagnosis not present

## 2021-06-07 DIAGNOSIS — E785 Hyperlipidemia, unspecified: Secondary | ICD-10-CM | POA: Diagnosis not present

## 2021-06-07 DIAGNOSIS — I509 Heart failure, unspecified: Secondary | ICD-10-CM | POA: Diagnosis not present

## 2021-06-09 ENCOUNTER — Ambulatory Visit: Payer: BC Managed Care – PPO | Admitting: Vascular Surgery

## 2021-06-09 ENCOUNTER — Encounter (HOSPITAL_COMMUNITY): Payer: BC Managed Care – PPO

## 2021-06-10 DIAGNOSIS — I5022 Chronic systolic (congestive) heart failure: Secondary | ICD-10-CM | POA: Diagnosis not present

## 2021-06-10 DIAGNOSIS — J9601 Acute respiratory failure with hypoxia: Secondary | ICD-10-CM | POA: Diagnosis not present

## 2021-06-15 DIAGNOSIS — I4892 Unspecified atrial flutter: Secondary | ICD-10-CM | POA: Diagnosis not present

## 2021-06-15 DIAGNOSIS — R69 Illness, unspecified: Secondary | ICD-10-CM | POA: Diagnosis not present

## 2021-06-15 DIAGNOSIS — E1165 Type 2 diabetes mellitus with hyperglycemia: Secondary | ICD-10-CM | POA: Diagnosis not present

## 2021-06-15 DIAGNOSIS — I110 Hypertensive heart disease with heart failure: Secondary | ICD-10-CM | POA: Diagnosis not present

## 2021-06-15 DIAGNOSIS — E785 Hyperlipidemia, unspecified: Secondary | ICD-10-CM | POA: Diagnosis not present

## 2021-06-15 DIAGNOSIS — Z9181 History of falling: Secondary | ICD-10-CM | POA: Diagnosis not present

## 2021-06-15 DIAGNOSIS — I2510 Atherosclerotic heart disease of native coronary artery without angina pectoris: Secondary | ICD-10-CM | POA: Diagnosis not present

## 2021-06-15 DIAGNOSIS — I509 Heart failure, unspecified: Secondary | ICD-10-CM | POA: Diagnosis not present

## 2021-06-15 DIAGNOSIS — Z9981 Dependence on supplemental oxygen: Secondary | ICD-10-CM | POA: Diagnosis not present

## 2021-06-15 DIAGNOSIS — J449 Chronic obstructive pulmonary disease, unspecified: Secondary | ICD-10-CM | POA: Diagnosis not present

## 2021-06-17 DIAGNOSIS — I10 Essential (primary) hypertension: Secondary | ICD-10-CM | POA: Diagnosis not present

## 2021-06-17 DIAGNOSIS — Z6837 Body mass index (BMI) 37.0-37.9, adult: Secondary | ICD-10-CM | POA: Diagnosis not present

## 2021-06-17 DIAGNOSIS — I4892 Unspecified atrial flutter: Secondary | ICD-10-CM | POA: Diagnosis not present

## 2021-06-17 DIAGNOSIS — R609 Edema, unspecified: Secondary | ICD-10-CM | POA: Diagnosis not present

## 2021-06-17 DIAGNOSIS — I499 Cardiac arrhythmia, unspecified: Secondary | ICD-10-CM | POA: Diagnosis not present

## 2021-06-17 DIAGNOSIS — R0902 Hypoxemia: Secondary | ICD-10-CM | POA: Diagnosis not present

## 2021-06-17 DIAGNOSIS — R7309 Other abnormal glucose: Secondary | ICD-10-CM | POA: Diagnosis not present

## 2021-06-17 DIAGNOSIS — I509 Heart failure, unspecified: Secondary | ICD-10-CM | POA: Diagnosis not present

## 2021-06-17 DIAGNOSIS — E669 Obesity, unspecified: Secondary | ICD-10-CM | POA: Diagnosis not present

## 2021-06-22 DIAGNOSIS — J449 Chronic obstructive pulmonary disease, unspecified: Secondary | ICD-10-CM | POA: Diagnosis not present

## 2021-06-22 DIAGNOSIS — I110 Hypertensive heart disease with heart failure: Secondary | ICD-10-CM | POA: Diagnosis not present

## 2021-06-22 DIAGNOSIS — E1165 Type 2 diabetes mellitus with hyperglycemia: Secondary | ICD-10-CM | POA: Diagnosis not present

## 2021-06-22 DIAGNOSIS — E785 Hyperlipidemia, unspecified: Secondary | ICD-10-CM | POA: Diagnosis not present

## 2021-06-22 DIAGNOSIS — R69 Illness, unspecified: Secondary | ICD-10-CM | POA: Diagnosis not present

## 2021-06-22 DIAGNOSIS — Z9981 Dependence on supplemental oxygen: Secondary | ICD-10-CM | POA: Diagnosis not present

## 2021-06-22 DIAGNOSIS — Z9181 History of falling: Secondary | ICD-10-CM | POA: Diagnosis not present

## 2021-06-22 DIAGNOSIS — I4892 Unspecified atrial flutter: Secondary | ICD-10-CM | POA: Diagnosis not present

## 2021-06-22 DIAGNOSIS — I2510 Atherosclerotic heart disease of native coronary artery without angina pectoris: Secondary | ICD-10-CM | POA: Diagnosis not present

## 2021-06-22 DIAGNOSIS — I509 Heart failure, unspecified: Secondary | ICD-10-CM | POA: Diagnosis not present

## 2021-06-29 DIAGNOSIS — R69 Illness, unspecified: Secondary | ICD-10-CM | POA: Diagnosis not present

## 2021-06-29 DIAGNOSIS — I2510 Atherosclerotic heart disease of native coronary artery without angina pectoris: Secondary | ICD-10-CM | POA: Diagnosis not present

## 2021-06-29 DIAGNOSIS — J449 Chronic obstructive pulmonary disease, unspecified: Secondary | ICD-10-CM | POA: Diagnosis not present

## 2021-06-29 DIAGNOSIS — I110 Hypertensive heart disease with heart failure: Secondary | ICD-10-CM | POA: Diagnosis not present

## 2021-06-29 DIAGNOSIS — I4892 Unspecified atrial flutter: Secondary | ICD-10-CM | POA: Diagnosis not present

## 2021-06-29 DIAGNOSIS — I509 Heart failure, unspecified: Secondary | ICD-10-CM | POA: Diagnosis not present

## 2021-06-29 DIAGNOSIS — E1165 Type 2 diabetes mellitus with hyperglycemia: Secondary | ICD-10-CM | POA: Diagnosis not present

## 2021-06-29 DIAGNOSIS — Z9981 Dependence on supplemental oxygen: Secondary | ICD-10-CM | POA: Diagnosis not present

## 2021-06-29 DIAGNOSIS — Z9181 History of falling: Secondary | ICD-10-CM | POA: Diagnosis not present

## 2021-06-29 DIAGNOSIS — E785 Hyperlipidemia, unspecified: Secondary | ICD-10-CM | POA: Diagnosis not present

## 2021-07-08 ENCOUNTER — Ambulatory Visit (HOSPITAL_COMMUNITY)
Admission: RE | Admit: 2021-07-08 | Discharge: 2021-07-08 | Disposition: A | Discharge status: Home / Self Care | Payer: Medicare HMO | Source: Ambulatory Visit | Attending: Internal Medicine | Admitting: Internal Medicine

## 2021-07-08 ENCOUNTER — Other Ambulatory Visit: Payer: Self-pay

## 2021-07-08 ENCOUNTER — Ambulatory Visit: Payer: Medicare HMO | Admitting: Internal Medicine

## 2021-07-08 ENCOUNTER — Encounter: Payer: Self-pay | Admitting: Internal Medicine

## 2021-07-08 DIAGNOSIS — J9611 Chronic respiratory failure with hypoxia: Secondary | ICD-10-CM | POA: Diagnosis not present

## 2021-07-08 DIAGNOSIS — F17210 Nicotine dependence, cigarettes, uncomplicated: Secondary | ICD-10-CM | POA: Insufficient documentation

## 2021-07-08 DIAGNOSIS — J9612 Chronic respiratory failure with hypercapnia: Secondary | ICD-10-CM

## 2021-07-08 DIAGNOSIS — R69 Illness, unspecified: Secondary | ICD-10-CM | POA: Diagnosis not present

## 2021-07-08 DIAGNOSIS — R918 Other nonspecific abnormal finding of lung field: Secondary | ICD-10-CM

## 2021-07-08 DIAGNOSIS — J449 Chronic obstructive pulmonary disease, unspecified: Secondary | ICD-10-CM

## 2021-07-08 DIAGNOSIS — R0602 Shortness of breath: Secondary | ICD-10-CM | POA: Diagnosis not present

## 2021-07-08 MED ORDER — BREZTRI AEROSPHERE 160-9-4.8 MCG/ACT IN AERO: 2.0000 | INHALATION_SPRAY | Freq: Two times a day (BID) | RESPIRATORY_TRACT | 0 refills | Status: DC

## 2021-07-08 NOTE — Progress Notes (Signed)
[name], male    DOB: Oct 01, 1955,     MRN: [id number]   Brief patient profile:  56  yowm  active smoker   referred to pulmonary clinic in [place]  07/08/2021 by Domenic Polite  for copd eval with onset 2018    History of Present Illness  07/08/2021  Pulmonary/ 1st office eval/  / [place] Office  Chief Complaint  Patient presents with   Consult    Self referral for what patient thought was COPD and it was Afib.  Patient using oxygen at home prn.   Dyspnea:  walking around the yard x 5 min / crosses parking lots/ [employer] pushing cart not using 02 much with exertion Cough: minimal /no am flare or purulent sputum  Sleep: bed flat/ one pillow on 2lpm  SABA use:  has symbicort breztri albuterol listed and not clear how he really uses them 02  2lpm hs and prn   No obvious day to day or daytime variability or assoc excess/ purulent sputum or mucus plugs or hemoptysis or cp or chest tightness, subjective wheeze or overt sinus or hb symptoms.   Sleeping  without nocturnal  or early am exacerbation  of respiratory  c/o's or need for noct saba. Also denies any obvious fluctuation of symptoms with weather or environmental changes or other aggravating or alleviating factors except as outlined above   No unusual exposure hx or h/o childhood pna/ asthma or knowledge of premature birth.  Current Allergies, Complete Past Medical History, Past Surgical History, Family History, and Social History were reviewed in [employer] record.  ROS  The following are not active complaints unless bolded Hoarseness, sore throat, dysphagia, dental problems, itching, sneezing,  nasal congestion or discharge of excess mucus or purulent secretions, ear ache,   fever, chills, sweats, unintended wt loss or wt gain, classically pleuritic or exertional cp,  orthopnea pnd or arm/hand swelling  or leg swelling, presyncope, palpitations, abdominal pain, anorexia, nausea, vomiting, diarrhea   or change in bowel habits or change in bladder habits, change in stools or change in urine, dysuria, hematuria,  rash, arthralgias, visual complaints, headache, numbness, weakness or ataxia or problems with walking or coordination,  change in mood or  memory.          Past Medical History:  Diagnosis Date   Colon cancer ([place]) [place] 2014   Hypertension    Renal disorder    Kidney stones    Outpatient Medications Prior to Visit  Medication Sig Dispense Refill   acetaminophen (TYLENOL) 325 MG tablet Take 2 tablets (650 mg total) by mouth every 6 (six) hours as needed for mild pain (or Fever >/= 101). 12 tablet 0   albuterol (PROVENTIL) (2.5 MG/3ML) 0.083% nebulizer solution Take 3 mLs by nebulization every 6 (six) hours as needed for wheezing or shortness of breath. 75 mL 12   albuterol (VENTOLIN HFA) 108 (90 Base) MCG/ACT inhaler Inhale 2 puffs into the lungs every 6 (six) hours as needed for wheezing or shortness of breath. 18 g 2   apixaban (ELIQUIS) 5 MG TABS tablet Take 1 tablet (5 mg total) by mouth 2 (two) times daily. 60 tablet 2   atorvastatin (LIPITOR) 20 MG tablet Take 1 tablet (20 mg total) by mouth every evening. 30 tablet 3   bisoprolol (ZEBETA) 5 MG tablet Take 1 tablet (5 mg total) by mouth daily. For BP and Heart Rate Control 30 tablet 5   Budeson-Glycopyrrol-Formoterol (BREZTRI AEROSPHERE) 160-9-4.8  MCG/ACT AERO Inhale 2 puffs into the lungs daily. 10.7 g 3   glimepiride (AMARYL) 2 MG tablet Take 1 tablet (2 mg total) by mouth every morning. 30 tablet 11   Multiple Vitamin (MULTI-VITAMIN DAILY PO) Take 1 tablet by mouth daily.     nicotine (NICODERM CQ - DOSED IN MG/24 HOURS) 21 mg/24hr patch Place 1 patch (21 mg total) onto the skin daily. 28 patch 0   potassium chloride (KLOR-CON) 10 MEQ tablet Take 1 tablet (10 mEq total) by mouth daily. Take While taking Lasix/furosemide 30 tablet 2   SYMBICORT 160-4.5 MCG/ACT inhaler Inhale 2 puffs into the lungs 2 (two) times daily. 10.2 g  5   torsemide 40 MG TABS Take 40 mg by mouth every morning. 30 tablet 2   No facility-administered medications prior to visit.     Objective:     BP 138/86 (BP Location: Left Arm, Patient Position: Sitting)    Pulse 96    Temp 98.2 F (36.8 C) (Temporal)    Ht 6' (1.829 m)    Wt 264 lb (119.7 kg)    SpO2 98% Comment: a   BMI 35.80 kg/m   SpO2: 98 % (a)   HEENT : pt wearing mask not removed for exam due to covid - 19 concerns.    NECK :  without JVD/Nodes/TM/ nl carotid upstrokes bilaterally   LUNGS: no acc muscle use,  Mild barrel  contour chest wall with bilateral  Distant bs s audible wheeze and  without cough on insp or exp maneuvers  and mild  Hyperresonant  to  percussion bilaterally     CV:  RRR  no s3 or murmur or increase in P2, and no edema   ABD: Obese  soft and nontender with pos end  insp Hoover's  in the supine position. No bruits or organomegaly appreciated, bowel sounds nl  MS:   Nl gait/  ext warm without deformities, calf tenderness, cyanosis or clubbing No obvious joint restrictions   SKIN: warm and dry without lesions    NEURO:  alert, approp, nl sensorium with  no motor or cerebellar deficits apparent.       CXR PA and Lateral:   07/08/2021 :    I personally reviewed images and agree with radiology impression as follows:    Increasing left mid to lower lung zone masslike airspace opacity    Assessment   COPD  GOLD ? / active smoker  Active smoker  - 07/08/2021  After extensive coaching inhaler device,  effectiveness =    75% try breztri samples and f/u with pfts  Needs pfts but clearly  Group D in terms of symptom/risk and laba/lama/ICS  therefore appropriate rx at this point >>>  breztri 2bid and prn saba  Re SABA :  I spent extra time with pt today reviewing appropriate use of albuterol for prn use on exertion with the following points: 1) saba is for relief of sob that does not improve by walking a slower pace or resting but rather if the pt  does not improve after trying this first. 2) If the pt is convinced, as many are, that saba helps recover from activity faster then it's easy to tell if this is the case by re-challenging : ie stop, take the inhaler, then p 5 minutes try the exact same activity (intensity of workload) that just caused the symptoms and see if they are substantially diminished or not after saba 3) if there is an activity  that reproducibly causes the symptoms, try the saba 15 min before the activity on alternate days   If in fact the saba really does help, then fine to continue to use it prn but advised may need to look closer at the maintenance regimen being used to achieve better control of airways disease with exertion.      Cigarette smoker Counseled re importance of smoking cessation but did not meet time criteria for separate Ramos           Chronic respiratory failure with hypoxia and hypercapnia ([place]) HC03   06/05/21   =   38  - 07/08/2021  patient walked at a moderate pace on room air x 300 ft. At end  desat to 88%. Placed on 2LO2 cont. patient sats increased to 93% for another 150 ft   rec 2lpm hs and daytime with ex with goal > 90%     Pulmonary infiltrates on CXR 1st noted on cxr 05/31/21 on admit for chf  No evidence of infection but very rapid for a "mass"  With BOOP clearly in the ddx so needs ESR and CT chest next    Each maintenance medication was reviewed in detail including emphasizing most importantly the difference between maintenance and prns and under what circumstances the prns are to be triggered using an action plan format where appropriate.  Total time for H and P, chart review, counseling, reviewing hfa/02  device(s) , directly observing portions of ambulatory 02 saturation study/ and generating customized AVS unique to this office visit / same day charting  > 45 min                   Christinia Gully, MD 07/08/2021

## 2021-07-08 NOTE — Assessment & Plan Note (Signed)
Active smoker  - 07/08/2021  After extensive coaching inhaler device,  effectiveness =    75% try breztri samples and f/u with pfts  Needs pfts but clearly  Group D in terms of symptom/risk and laba/lama/ICS  therefore appropriate rx at this point >>>  breztri 2bid and prn saba  Re SABA :  I spent extra time with pt today reviewing appropriate use of albuterol for prn use on exertion with the following points: 1) saba is for relief of sob that does not improve by walking a slower pace or resting but rather if the pt does not improve after trying this first. 2) If the pt is convinced, as many are, that saba helps recover from activity faster then it's easy to tell if this is the case by re-challenging : ie stop, take the inhaler, then p 5 minutes try the exact same activity (intensity of workload) that just caused the symptoms and see if they are substantially diminished or not after saba 3) if there is an activity that reproducibly causes the symptoms, try the saba 15 min before the activity on alternate days   If in fact the saba really does help, then fine to continue to use it prn but advised may need to look closer at the maintenance regimen being used to achieve better control of airways disease with exertion.

## 2021-07-08 NOTE — Patient Instructions (Addendum)
Plan A = Automatic = Always=    Breztri Take 2 puffs first thing in am and then another 2 puffs about 12 hours later.    Work on inhaler technique:  relax and gently blow all the way out then take a nice smooth full deep breath back in, triggering the inhaler at same time you start breathing in.  Hold for up to 5 seconds if you can. Blow out thru nose. Rinse and gargle with water when done.  If mouth or throat bother you at all,  try brushing teeth/gums/tongue with arm and hammer toothpaste/ make a slurry and gargle and spit out.   Remember to warm up like a golfer  Plan B = Backup (to supplement plan A, not to replace it) Only use your albuterol inhaler as a rescue medication to be used if you can't catch your breath by resting or doing a relaxed purse lip breathing pattern.  - The less you use it, the better it will work when you need it. - Ok to use the inhaler up to 2 puffs  every 4 hours if you must but call for appointment if use goes up over your usual need - Don't leave home without it !!  (think of it like the spare tire for your car)   Plan C = Crisis (instead of Plan B but only if Plan B stops working) - only use your albuterol nebulizer if you first try Plan B and it fails to help > ok to use the nebulizer up to every 4 hours but if start needing it regularly call for immediate appointment  Make sure you check your oxygen saturation  AT  your highest level of activity (not after you stop)   to be sure it stays over 90% and adjust  02 flow upward to maintain this level if needed but remember to turn it back to previous settings when you stop (to conserve your supply).   Please remember to go to the  x-ray department  @  [facility] for your tests - we will call you with the results when they are available     Please schedule a follow up office visit in 6 weeks, PFTs on return  Add ct chest /esr/ alpha one / cbc with diff Raiford Simmonds

## 2021-07-08 NOTE — Assessment & Plan Note (Signed)
Counseled re importance of smoking cessation but did not meet time criteria for separate billing     Each maintenance medication was reviewed in detail including emphasizing most importantly the difference between maintenance and prns and under what circumstances the prns are to be triggered using an action plan format where appropriate.  Total time for H and P, chart review, counseling, reviewing hfa/neb/02  device(s) , directly observing portions of ambulatory 02 saturation study/ and generating customized AVS unique to this office visit / same day charting  > 45 min

## 2021-07-09 ENCOUNTER — Telehealth: Payer: Self-pay

## 2021-07-09 ENCOUNTER — Encounter: Payer: Self-pay | Admitting: Internal Medicine

## 2021-07-09 DIAGNOSIS — R918 Other nonspecific abnormal finding of lung field: Secondary | ICD-10-CM | POA: Insufficient documentation

## 2021-07-09 DIAGNOSIS — Z720 Tobacco use: Secondary | ICD-10-CM | POA: Diagnosis not present

## 2021-07-09 DIAGNOSIS — Z9181 History of falling: Secondary | ICD-10-CM | POA: Diagnosis not present

## 2021-07-09 DIAGNOSIS — I110 Hypertensive heart disease with heart failure: Secondary | ICD-10-CM | POA: Diagnosis not present

## 2021-07-09 DIAGNOSIS — E1165 Type 2 diabetes mellitus with hyperglycemia: Secondary | ICD-10-CM | POA: Diagnosis not present

## 2021-07-09 DIAGNOSIS — E785 Hyperlipidemia, unspecified: Secondary | ICD-10-CM | POA: Diagnosis not present

## 2021-07-09 DIAGNOSIS — R69 Illness, unspecified: Secondary | ICD-10-CM | POA: Diagnosis not present

## 2021-07-09 DIAGNOSIS — I4892 Unspecified atrial flutter: Secondary | ICD-10-CM | POA: Diagnosis not present

## 2021-07-09 DIAGNOSIS — Z9981 Dependence on supplemental oxygen: Secondary | ICD-10-CM | POA: Diagnosis not present

## 2021-07-09 DIAGNOSIS — J449 Chronic obstructive pulmonary disease, unspecified: Secondary | ICD-10-CM | POA: Diagnosis not present

## 2021-07-09 DIAGNOSIS — I2510 Atherosclerotic heart disease of native coronary artery without angina pectoris: Secondary | ICD-10-CM | POA: Diagnosis not present

## 2021-07-09 DIAGNOSIS — C189 Malignant neoplasm of colon, unspecified: Secondary | ICD-10-CM | POA: Diagnosis not present

## 2021-07-09 DIAGNOSIS — I509 Heart failure, unspecified: Secondary | ICD-10-CM | POA: Diagnosis not present

## 2021-07-09 NOTE — Telephone Encounter (Signed)
-----   Message from Tanda Rockers, MD sent at 07/09/2021  5:48 AM EST ----- Call patient :  Study is worrisome for pneumonia but he doesn't have any symptoms to suggest it so rec CT chest next available, hold off on abx and return for labs (ordered and pending) asap and we'll go from there

## 2021-07-09 NOTE — Assessment & Plan Note (Signed)
HC03   06/05/21   =   38  - 07/08/2021  patient walked at a moderate pace on room air x 300 ft. At end  desat to 88%. Placed on 2LO2 cont. patient sats increased to 93% for another 150 ft   rec 2lpm hs and daytime with ex with goal > 90%

## 2021-07-09 NOTE — Assessment & Plan Note (Signed)
1st noted on cxr 05/31/21 on admit for chf  No evidence of infection but very rapid for a "mass"  With BOOP clearly in the ddx so needs ESR and CT chest next    Each maintenance medication was reviewed in detail including emphasizing most importantly the difference between maintenance and prns and under what circumstances the prns are to be triggered using an action plan format where appropriate.  Total time for H and P, chart review, counseling, reviewing hfa/02  device(s) , directly observing portions of ambulatory 02 saturation study/ and generating customized AVS unique to this office visit / same day charting  > 45 min

## 2021-07-11 DIAGNOSIS — J9601 Acute respiratory failure with hypoxia: Secondary | ICD-10-CM | POA: Diagnosis not present

## 2021-07-11 DIAGNOSIS — I5022 Chronic systolic (congestive) heart failure: Secondary | ICD-10-CM | POA: Diagnosis not present

## 2021-07-12 ENCOUNTER — Other Ambulatory Visit: Payer: Self-pay | Admitting: Internal Medicine

## 2021-07-12 DIAGNOSIS — R918 Other nonspecific abnormal finding of lung field: Secondary | ICD-10-CM

## 2021-07-14 ENCOUNTER — Telehealth: Payer: Self-pay | Admitting: Internal Medicine

## 2021-07-14 DIAGNOSIS — Z9981 Dependence on supplemental oxygen: Secondary | ICD-10-CM | POA: Diagnosis not present

## 2021-07-14 DIAGNOSIS — J449 Chronic obstructive pulmonary disease, unspecified: Secondary | ICD-10-CM | POA: Diagnosis not present

## 2021-07-14 DIAGNOSIS — I2510 Atherosclerotic heart disease of native coronary artery without angina pectoris: Secondary | ICD-10-CM | POA: Diagnosis not present

## 2021-07-14 DIAGNOSIS — I4892 Unspecified atrial flutter: Secondary | ICD-10-CM | POA: Diagnosis not present

## 2021-07-14 DIAGNOSIS — Z9181 History of falling: Secondary | ICD-10-CM | POA: Diagnosis not present

## 2021-07-14 DIAGNOSIS — I509 Heart failure, unspecified: Secondary | ICD-10-CM | POA: Diagnosis not present

## 2021-07-14 DIAGNOSIS — I110 Hypertensive heart disease with heart failure: Secondary | ICD-10-CM | POA: Diagnosis not present

## 2021-07-14 DIAGNOSIS — E1165 Type 2 diabetes mellitus with hyperglycemia: Secondary | ICD-10-CM | POA: Diagnosis not present

## 2021-07-14 DIAGNOSIS — R69 Illness, unspecified: Secondary | ICD-10-CM | POA: Diagnosis not present

## 2021-07-14 DIAGNOSIS — E785 Hyperlipidemia, unspecified: Secondary | ICD-10-CM | POA: Diagnosis not present

## 2021-07-14 LAB — CBC WITH DIFFERENTIAL/PLATELET
Basophils Absolute: 0 10*3/uL (ref 0.0–0.2)
Basos: 0 %
EOS (ABSOLUTE): 0.1 10*3/uL (ref 0.0–0.4)
Eos: 1 %
Hematocrit: 42.4 % (ref 37.5–51.0)
Hemoglobin: 14.3 g/dL (ref 13.0–17.7)
Immature Grans (Abs): 0.1 10*3/uL (ref 0.0–0.1)
Immature Granulocytes: 1 %
Lymphocytes Absolute: 2.4 10*3/uL (ref 0.7–3.1)
Lymphs: 22 %
MCH: 32 pg (ref 26.6–33.0)
MCHC: 33.7 g/dL (ref 31.5–35.7)
MCV: 95 fL (ref 79–97)
Monocytes Absolute: 1 10*3/uL — ABNORMAL HIGH (ref 0.1–0.9)
Monocytes: 9 %
Neutrophils Absolute: 7.4 10*3/uL — ABNORMAL HIGH (ref 1.4–7.0)
Neutrophils: 67 %
Platelets: 214 10*3/uL (ref 150–450)
RBC: 4.47 x10E6/uL (ref 4.14–5.80)
RDW: 12.7 % (ref 11.6–15.4)
WBC: 11 10*3/uL — ABNORMAL HIGH (ref 3.4–10.8)

## 2021-07-14 LAB — SEDIMENTATION RATE: Sed Rate: 18 mm/hr (ref 0–30)

## 2021-07-14 LAB — ALPHA-1-ANTITRYPSIN PHENOTYP: A-1 Antitrypsin: 220 mg/dL — ABNORMAL HIGH (ref 101–187)

## 2021-07-14 LAB — CEA: CEA: 2.9 ng/mL (ref 0.0–4.7)

## 2021-07-14 NOTE — Telephone Encounter (Signed)
Did not see any lab results from Dr. [name] to relay to patient. Will route to Dr. [name] to see if he has had a chance to look at lab results.

## 2021-07-15 NOTE — Telephone Encounter (Signed)
Called and went over lab results with patient. He voiced understanding. Nothing further needed.

## 2021-07-19 ENCOUNTER — Other Ambulatory Visit (HOSPITAL_COMMUNITY)
Admission: RE | Admit: 2021-07-19 | Discharge: 2021-07-19 | Disposition: A | Discharge status: Home / Self Care | Payer: Medicare HMO | Source: Ambulatory Visit | Attending: Internal Medicine | Admitting: Internal Medicine

## 2021-07-19 DIAGNOSIS — Z20822 Contact with and (suspected) exposure to covid-19: Secondary | ICD-10-CM | POA: Diagnosis not present

## 2021-07-19 DIAGNOSIS — Z01818 Encounter for other preprocedural examination: Secondary | ICD-10-CM

## 2021-07-19 DIAGNOSIS — Z01812 Encounter for preprocedural laboratory examination: Secondary | ICD-10-CM | POA: Insufficient documentation

## 2021-07-19 LAB — SARS CORONAVIRUS 2 (TAT 6-24 HRS): SARS Coronavirus 2: NEGATIVE

## 2021-07-22 ENCOUNTER — Telehealth: Payer: Self-pay | Admitting: Internal Medicine

## 2021-07-22 ENCOUNTER — Ambulatory Visit (HOSPITAL_COMMUNITY)
Admission: RE | Admit: 2021-07-22 | Discharge: 2021-07-22 | Disposition: A | Discharge status: Home / Self Care | Payer: Medicare HMO | Source: Ambulatory Visit | Attending: Internal Medicine | Admitting: Internal Medicine

## 2021-07-22 DIAGNOSIS — I509 Heart failure, unspecified: Secondary | ICD-10-CM | POA: Diagnosis not present

## 2021-07-22 DIAGNOSIS — J449 Chronic obstructive pulmonary disease, unspecified: Secondary | ICD-10-CM | POA: Insufficient documentation

## 2021-07-22 DIAGNOSIS — Z9981 Dependence on supplemental oxygen: Secondary | ICD-10-CM | POA: Diagnosis not present

## 2021-07-22 DIAGNOSIS — I2510 Atherosclerotic heart disease of native coronary artery without angina pectoris: Secondary | ICD-10-CM | POA: Diagnosis not present

## 2021-07-22 DIAGNOSIS — E1165 Type 2 diabetes mellitus with hyperglycemia: Secondary | ICD-10-CM | POA: Diagnosis not present

## 2021-07-22 DIAGNOSIS — I4892 Unspecified atrial flutter: Secondary | ICD-10-CM | POA: Diagnosis not present

## 2021-07-22 DIAGNOSIS — Z9181 History of falling: Secondary | ICD-10-CM | POA: Diagnosis not present

## 2021-07-22 DIAGNOSIS — R69 Illness, unspecified: Secondary | ICD-10-CM | POA: Diagnosis not present

## 2021-07-22 DIAGNOSIS — E785 Hyperlipidemia, unspecified: Secondary | ICD-10-CM | POA: Diagnosis not present

## 2021-07-22 DIAGNOSIS — I110 Hypertensive heart disease with heart failure: Secondary | ICD-10-CM | POA: Diagnosis not present

## 2021-07-22 LAB — PULMONARY FUNCTION TEST
DL/VA % pred: 67 %
DL/VA: 2.76 ml/min/mmHg/L
DLCO unc % pred: 39 %
DLCO unc: 11.2 ml/min/mmHg
FEF 25-75 Post: 1.21 L/sec
FEF 25-75 Pre: 1 L/sec
FEF2575-%Change-Post: 21 %
FEF2575-%Pred-Post: 42 %
FEF2575-%Pred-Pre: 34 %
FEV1-%Change-Post: 3 %
FEV1-%Pred-Post: 49 %
FEV1-%Pred-Pre: 47 %
FEV1-Post: 1.81 L
FEV1-Pre: 1.74 L
FEV1FVC-%Change-Post: 0 %
FEV1FVC-%Pred-Pre: 93 %
FEV6-%Change-Post: 2 %
FEV6-%Pred-Post: 53 %
FEV6-%Pred-Pre: 52 %
FEV6-Post: 2.51 L
FEV6-Pre: 2.44 L
FEV6FVC-%Change-Post: 0 %
FEV6FVC-%Pred-Post: 102 %
FEV6FVC-%Pred-Pre: 102 %
FVC-%Change-Post: 3 %
FVC-%Pred-Post: 52 %
FVC-%Pred-Pre: 50 %
FVC-Post: 2.59 L
FVC-Pre: 2.5 L
Post FEV1/FVC ratio: 70 %
Post FEV6/FVC ratio: 97 %
Pre FEV1/FVC ratio: 70 %
Pre FEV6/FVC Ratio: 98 %
RV % pred: 70 %
RV: 1.74 L
TLC % pred: 57 %
TLC: 4.26 L

## 2021-07-22 MED ORDER — ALBUTEROL SULFATE (2.5 MG/3ML) 0.083% IN NEBU
2.5000 mg | INHALATION_SOLUTION | Freq: Once | RESPIRATORY_TRACT | Status: AC
  Administered 2021-07-22: 2.5 mg via RESPIRATORY_TRACT

## 2021-07-22 NOTE — Telephone Encounter (Signed)
Called and went over results with patient. Nothing further needed.  ?

## 2021-07-23 ENCOUNTER — Ambulatory Visit (HOSPITAL_COMMUNITY)
Admission: RE | Admit: 2021-07-23 | Discharge: 2021-07-23 | Disposition: A | Discharge status: Home / Self Care | Payer: Medicare HMO | Source: Ambulatory Visit | Attending: Internal Medicine | Admitting: Internal Medicine

## 2021-07-23 ENCOUNTER — Encounter (HOSPITAL_COMMUNITY): Payer: Self-pay

## 2021-07-23 ENCOUNTER — Other Ambulatory Visit: Payer: Self-pay

## 2021-07-23 DIAGNOSIS — R918 Other nonspecific abnormal finding of lung field: Secondary | ICD-10-CM | POA: Diagnosis not present

## 2021-07-23 DIAGNOSIS — I700 Atherosclerosis of aorta: Secondary | ICD-10-CM | POA: Diagnosis not present

## 2021-07-23 DIAGNOSIS — J189 Pneumonia, unspecified organism: Secondary | ICD-10-CM | POA: Diagnosis not present

## 2021-07-23 DIAGNOSIS — J9811 Atelectasis: Secondary | ICD-10-CM | POA: Diagnosis not present

## 2021-07-27 ENCOUNTER — Telehealth: Payer: Self-pay | Admitting: Internal Medicine

## 2021-07-28 ENCOUNTER — Telehealth: Payer: Self-pay

## 2021-07-28 DIAGNOSIS — J449 Chronic obstructive pulmonary disease, unspecified: Secondary | ICD-10-CM

## 2021-07-28 NOTE — Telephone Encounter (Signed)
Called and spoke to patient in regards to CT scan. Results came back inconclusive per Dr [name] he would like patient to have PET scan. He will call patient with the results when complete. Nothing further needed.  ?

## 2021-07-29 DIAGNOSIS — Z9181 History of falling: Secondary | ICD-10-CM | POA: Diagnosis not present

## 2021-07-29 DIAGNOSIS — E785 Hyperlipidemia, unspecified: Secondary | ICD-10-CM | POA: Diagnosis not present

## 2021-07-29 DIAGNOSIS — R69 Illness, unspecified: Secondary | ICD-10-CM | POA: Diagnosis not present

## 2021-07-29 DIAGNOSIS — I4892 Unspecified atrial flutter: Secondary | ICD-10-CM | POA: Diagnosis not present

## 2021-07-29 DIAGNOSIS — J449 Chronic obstructive pulmonary disease, unspecified: Secondary | ICD-10-CM | POA: Diagnosis not present

## 2021-07-29 DIAGNOSIS — E1165 Type 2 diabetes mellitus with hyperglycemia: Secondary | ICD-10-CM | POA: Diagnosis not present

## 2021-07-29 DIAGNOSIS — I509 Heart failure, unspecified: Secondary | ICD-10-CM | POA: Diagnosis not present

## 2021-07-29 DIAGNOSIS — I2510 Atherosclerotic heart disease of native coronary artery without angina pectoris: Secondary | ICD-10-CM | POA: Diagnosis not present

## 2021-07-29 DIAGNOSIS — Z9981 Dependence on supplemental oxygen: Secondary | ICD-10-CM | POA: Diagnosis not present

## 2021-07-29 DIAGNOSIS — I110 Hypertensive heart disease with heart failure: Secondary | ICD-10-CM | POA: Diagnosis not present

## 2021-08-02 ENCOUNTER — Other Ambulatory Visit: Payer: Self-pay | Admitting: Internal Medicine

## 2021-08-02 DIAGNOSIS — R918 Other nonspecific abnormal finding of lung field: Secondary | ICD-10-CM

## 2021-08-04 DIAGNOSIS — I4892 Unspecified atrial flutter: Secondary | ICD-10-CM | POA: Diagnosis not present

## 2021-08-04 DIAGNOSIS — I2510 Atherosclerotic heart disease of native coronary artery without angina pectoris: Secondary | ICD-10-CM | POA: Diagnosis not present

## 2021-08-04 DIAGNOSIS — I509 Heart failure, unspecified: Secondary | ICD-10-CM | POA: Diagnosis not present

## 2021-08-04 DIAGNOSIS — Z9181 History of falling: Secondary | ICD-10-CM | POA: Diagnosis not present

## 2021-08-04 DIAGNOSIS — Z9981 Dependence on supplemental oxygen: Secondary | ICD-10-CM | POA: Diagnosis not present

## 2021-08-04 DIAGNOSIS — E1165 Type 2 diabetes mellitus with hyperglycemia: Secondary | ICD-10-CM | POA: Diagnosis not present

## 2021-08-04 DIAGNOSIS — R69 Illness, unspecified: Secondary | ICD-10-CM | POA: Diagnosis not present

## 2021-08-04 DIAGNOSIS — E785 Hyperlipidemia, unspecified: Secondary | ICD-10-CM | POA: Diagnosis not present

## 2021-08-04 DIAGNOSIS — I110 Hypertensive heart disease with heart failure: Secondary | ICD-10-CM | POA: Diagnosis not present

## 2021-08-04 DIAGNOSIS — J449 Chronic obstructive pulmonary disease, unspecified: Secondary | ICD-10-CM | POA: Diagnosis not present

## 2021-08-08 DIAGNOSIS — J9601 Acute respiratory failure with hypoxia: Secondary | ICD-10-CM | POA: Diagnosis not present

## 2021-08-08 DIAGNOSIS — I5022 Chronic systolic (congestive) heart failure: Secondary | ICD-10-CM | POA: Diagnosis not present

## 2021-08-11 DIAGNOSIS — E119 Type 2 diabetes mellitus without complications: Secondary | ICD-10-CM | POA: Diagnosis not present

## 2021-08-11 DIAGNOSIS — I110 Hypertensive heart disease with heart failure: Secondary | ICD-10-CM | POA: Diagnosis not present

## 2021-08-11 DIAGNOSIS — D6869 Other thrombophilia: Secondary | ICD-10-CM | POA: Diagnosis not present

## 2021-08-11 DIAGNOSIS — J449 Chronic obstructive pulmonary disease, unspecified: Secondary | ICD-10-CM | POA: Diagnosis not present

## 2021-08-11 DIAGNOSIS — I4891 Unspecified atrial fibrillation: Secondary | ICD-10-CM | POA: Diagnosis not present

## 2021-08-11 DIAGNOSIS — I4892 Unspecified atrial flutter: Secondary | ICD-10-CM | POA: Diagnosis not present

## 2021-08-11 DIAGNOSIS — E669 Obesity, unspecified: Secondary | ICD-10-CM | POA: Diagnosis not present

## 2021-08-11 DIAGNOSIS — I509 Heart failure, unspecified: Secondary | ICD-10-CM | POA: Diagnosis not present

## 2021-08-11 DIAGNOSIS — R0902 Hypoxemia: Secondary | ICD-10-CM | POA: Diagnosis not present

## 2021-08-11 DIAGNOSIS — N529 Male erectile dysfunction, unspecified: Secondary | ICD-10-CM | POA: Diagnosis not present

## 2021-08-11 DIAGNOSIS — R69 Illness, unspecified: Secondary | ICD-10-CM | POA: Diagnosis not present

## 2021-08-11 DIAGNOSIS — E785 Hyperlipidemia, unspecified: Secondary | ICD-10-CM | POA: Diagnosis not present

## 2021-08-12 ENCOUNTER — Other Ambulatory Visit: Payer: Self-pay

## 2021-08-12 ENCOUNTER — Ambulatory Visit (HOSPITAL_COMMUNITY)
Admission: RE | Admit: 2021-08-12 | Discharge: 2021-08-12 | Disposition: A | Discharge status: Home / Self Care | Payer: Medicare HMO | Source: Ambulatory Visit | Attending: Internal Medicine | Admitting: Internal Medicine

## 2021-08-12 DIAGNOSIS — I2510 Atherosclerotic heart disease of native coronary artery without angina pectoris: Secondary | ICD-10-CM | POA: Diagnosis not present

## 2021-08-12 DIAGNOSIS — J449 Chronic obstructive pulmonary disease, unspecified: Secondary | ICD-10-CM | POA: Insufficient documentation

## 2021-08-12 DIAGNOSIS — S2243XA Multiple fractures of ribs, bilateral, initial encounter for closed fracture: Secondary | ICD-10-CM | POA: Diagnosis not present

## 2021-08-12 DIAGNOSIS — N200 Calculus of kidney: Secondary | ICD-10-CM | POA: Diagnosis not present

## 2021-08-12 DIAGNOSIS — J432 Centrilobular emphysema: Secondary | ICD-10-CM | POA: Diagnosis not present

## 2021-08-12 MED ORDER — FLUDEOXYGLUCOSE F - 18 (FDG) INJECTION
13.5000 | Freq: Once | INTRAVENOUS | Status: AC | PRN
  Administered 2021-08-12: 13.5 via INTRAVENOUS

## 2021-08-16 ENCOUNTER — Telehealth: Payer: Self-pay | Admitting: Internal Medicine

## 2021-08-16 ENCOUNTER — Telehealth: Payer: Self-pay | Admitting: Pulmonary Disease

## 2021-08-16 DIAGNOSIS — R918 Other nonspecific abnormal finding of lung field: Secondary | ICD-10-CM

## 2021-08-16 NOTE — Telephone Encounter (Signed)
Let  him know it hasn't been read ? ?Call [phone] to let radiology know it must have slipped thru somewhere ?

## 2021-08-16 NOTE — Telephone Encounter (Signed)
PCCM: ? ?Spoke with Dr. [name].  Also called and spoke with the patient regarding potential date for procedure.  Patient is agreeable to meet with me day of procedure.  We talked about this on the phone.  Tentative bronchoscopy date will be 08/26/2021, next Thursday. ? ?Orders placed for bronchoscopy. ?We will need to hold Eliquis 2 full days prior to procedure. ?Eliquis last dose 08/23/2021. ? ?Garner Nash, DO ?[facility] ?08/16/2021 6:14 PM  ? ?

## 2021-08-16 NOTE — Telephone Encounter (Signed)
See result note.  

## 2021-08-16 NOTE — Telephone Encounter (Signed)
Spoke to [facility] radiology. CT is currently being read.  ? ?

## 2021-08-16 NOTE — Telephone Encounter (Signed)
Spoke to patient.  ?He is requesting PET results from 08/12/2021. ? ?Dr. [name], please advise. Thanks ?

## 2021-08-17 ENCOUNTER — Encounter: Payer: Self-pay | Admitting: Pulmonary Disease

## 2021-08-20 ENCOUNTER — Other Ambulatory Visit (HOSPITAL_COMMUNITY): Payer: Medicare HMO

## 2021-08-23 ENCOUNTER — Other Ambulatory Visit: Payer: Self-pay | Admitting: Pulmonary Disease

## 2021-08-23 LAB — SARS CORONAVIRUS 2 (TAT 6-24 HRS): SARS Coronavirus 2: NEGATIVE

## 2021-08-23 NOTE — H&P (View-Only) (Signed)
? ?[name], male    DOB: 1955/10/14,     MRN: [id number] ? ? ?Brief patient profile:  ?74  yowm  active smoker /MM  referred to pulmonary clinic in [place]  07/08/2021 by [name]  for copd eval with onset 2018  ? ? ?History of Present Illness  ?07/08/2021  Pulmonary/ 1st Ramos eval/ [name] / [facility] Ramos  ?Chief Complaint  ?Patient presents with  ? Consult  ?  Self referral for what patient thought was COPD and it was Afib.  ?Patient using oxygen at home prn.   ?Dyspnea:  walking around the yard x 5 min / crosses parking lots/ [employer] pushing cart not using 02 much with exertion ?Cough: minimal /no am flare or purulent sputum  ?Sleep: bed flat/ one pillow on 2lpm  ?SABA use:  has symbicort breztri albuterol listed and not clear how he really uses them ?02  2lpm hs and prn  ?Rec ?Plan A = Automatic = Always=    Breztri Take 2 puffs first thing in am and then another 2 puffs about 12 hours later.  ? Work on inhaler technique:  ?Remember to warm up like a golfer ?Plan B = Backup (to supplement plan A, not to replace it) ?Only use your albuterol inhaler as a rescue medication  ?Plan C = Crisis (instead of Plan B but only if Plan B stops working) ?- only use your albuterol nebulizer if you first try Plan B and it fails to help > ok to use the nebulizer up to every 4 hours but if start needing it regularly call for immediate appointment ?Make sure you check your oxygen saturation  AT  your highest level of activity (not after you stop)   to be sure it stays over 90% ?  ? ? ? ?08/24/2021  f/u ov/[name]/[name] re: GOLD 3 / restrictive component from obesity  maint on breztri   ?Chief Complaint  ?Patient presents with  ? Follow-up  ?  Patient here to go over CT and PET scan. Patient also had PFT done. Is scheduled for Bronch Thursday. Increased Productive cough with clear sputum and feels like breathing is better.  ? Dyspnea:  MMRC1 = can walk nl pace, flat grade, can't hurry or go uphills or steps s  sob   ?Cough: cough after hs  ?Sleeping: bed is flat/ on side / 2 pillows  ?SABA use: a lot less now  ?02:  2lpm hs  and when walks > 90%  ?Covid status: vax 3  ?  ? ? ?No obvious day to day or daytime variability or assoc excess/ purulent sputum or mucus plugs or hemoptysis or cp or chest tightness, subjective wheeze or overt sinus or hb symptoms.  ? ?Sleeping as above without nocturnal  or early am exacerbation  of respiratory  c/o's or need for noct saba. Also denies any obvious fluctuation of symptoms with weather or environmental changes or other aggravating or alleviating factors except as outlined above  ? ?No unusual exposure hx or h/o childhood pna/ asthma or knowledge of premature birth. ? ?Current Allergies, Complete Past Medical History, Past Surgical History, Family History, and Social History were reviewed in [employer] record. ? ?ROS  The following are not active complaints unless bolded ?Hoarseness, sore throat, dysphagia, dental problems, itching, sneezing,  nasal congestion or discharge of excess mucus or purulent secretions, ear ache,   fever, chills, sweats, unintended wt loss or wt gain, classically pleuritic or exertional  cp,  orthopnea pnd or arm/hand swelling  or leg swelling, presyncope, palpitations, abdominal pain, anorexia, nausea, vomiting, diarrhea  or change in bowel habits or change in bladder habits, change in stools or change in urine, dysuria, hematuria,  rash, arthralgias, visual complaints, headache, numbness, weakness or ataxia or problems with walking or coordination,  change in mood or  memory. ?      ? ?Current Meds  ?Medication Sig  ? acetaminophen (TYLENOL) 325 MG tablet Take 2 tablets (650 mg total) by mouth every 6 (six) hours as needed for mild pain (or Fever >/= 101).  ? albuterol (PROVENTIL) (2.5 MG/3ML) 0.083% nebulizer solution Take 3 mLs by nebulization every 6 (six) hours as needed for wheezing or shortness of breath.  ? albuterol (VENTOLIN  HFA) 108 (90 Base) MCG/ACT inhaler Inhale 2 puffs into the lungs every 6 (six) hours as needed for wheezing or shortness of breath.  ? apixaban (ELIQUIS) 5 MG TABS tablet Take 1 tablet (5 mg total) by mouth 2 (two) times daily.  ? atorvastatin (LIPITOR) 20 MG tablet Take 1 tablet (20 mg total) by mouth every evening.  ? bisoprolol (ZEBETA) 5 MG tablet Take 1 tablet (5 mg total) by mouth daily. For BP and Heart Rate Control  ? Budeson-Glycopyrrol-Formoterol (BREZTRI AEROSPHERE) 160-9-4.8 MCG/ACT AERO Inhale 2 puffs into the lungs daily.  ? Budeson-Glycopyrrol-Formoterol (BREZTRI AEROSPHERE) 160-9-4.8 MCG/ACT AERO Inhale 2 puffs into the lungs in the morning and at bedtime.  ? glimepiride (AMARYL) 2 MG tablet Take 1 tablet (2 mg total) by mouth every morning.  ? Multiple Vitamin (MULTI-VITAMIN DAILY PO) Take 1 tablet by mouth daily.  ? nicotine (NICODERM CQ - DOSED IN MG/24 HOURS) 21 mg/24hr patch Place 1 patch (21 mg total) onto the skin daily.  ? potassium chloride (KLOR-CON) 10 MEQ tablet Take 1 tablet (10 mEq total) by mouth daily. Take While taking Lasix/furosemide  ? torsemide 40 MG TABS Take 40 mg by mouth every morning.  ?     ?  ? ? ?Objective:  ?  ?Wt Readings from Last 3 Encounters:  ?08/24/21 264 lb 12.8 oz (120.1 kg)  ?07/08/21 264 lb (119.7 kg)  ?06/05/21 259 lb 0.7 oz (117.5 kg)  ?  ? ? ?Vital signs reviewed  08/24/2021  - Note at rest 02 sats  94% on RA  ? ?General appearance:    pleasant obese wm nad    ? ?HEENT : pt wearing mask not removed for exam due to covid - 19 concerns.  ? ? ?NECK :  without JVD/Nodes/TM/ nl carotid upstrokes bilaterally ? ? ?LUNGS: no acc muscle use,  Mild barrel  contour chest wall with bilateral  Distant bs s audible wheeze and  without cough on insp or exp maneuvers  and mild  Hyperresonant  to  percussion bilaterally   ? ? ?CV:  RRR  no s3 or murmur or increase in P2, and no edema  ? ?ABD:  quite obese soft and nontender with  limited insp excursion supine but not paradox.  No bruits or organomegaly appreciated, bowel sounds nl ? ?MS:   Nl gait/  ext warm without deformities, calf tenderness, cyanosis or clubbing ?No obvious joint restrictions  ? ?SKIN: warm and dry without lesions   ? ?NEURO:  alert, approp, nl sensorium with  no motor or cerebellar deficits apparent.  ?    ?  ?    ?  ?   ?Assessment  ? ?  ? ?   ? ? ?  ?   ?

## 2021-08-23 NOTE — Progress Notes (Signed)
? ?[name], male    DOB: [birth year],     MRN: [id number] ? ? ?Brief patient profile:  ?85  yowm  active smoker /MM  referred to pulmonary clinic in [place]  07/08/2021 by Domenic Polite  for copd eval with onset 2018  ? ? ?History of Present Illness  ?07/08/2021  Pulmonary/ 1st office eval/ [name] / [facility]  ?Chief Complaint  ?Patient presents with  ? Consult  ?  Self referral for what patient thought was COPD and it was Afib.  ?Patient using oxygen at home prn.   ?Dyspnea:  walking around the yard x 5 min / crosses parking lots/ [employer] pushing cart not using 02 much with exertion ?Cough: minimal /no am flare or purulent sputum  ?Sleep: bed flat/ one pillow on 2lpm  ?SABA use:  has symbicort breztri albuterol listed and not clear how he really uses them ?02  2lpm hs and prn  ?Rec ?Plan A = Automatic = Always=    Breztri Take 2 puffs first thing in am and then another 2 puffs about 12 hours later.  ? Work on inhaler technique:  ?Remember to warm up like a golfer ?Plan B = Backup (to supplement plan A, not to replace it) ?Only use your albuterol inhaler as a rescue medication  ?Plan C = Crisis (instead of Plan B but only if Plan B stops working) ?- only use your albuterol nebulizer if you first try Plan B and it fails to help > ok to use the nebulizer up to every 4 hours but if start needing it regularly call for immediate appointment ?Make sure you check your oxygen saturation  AT  your highest level of activity (not after you stop)   to be sure it stays over 90% ?  ? ? ? ?08/24/2021  f/u ov/[place] office/[name] re: GOLD 3 / restrictive component from obesity  maint on breztri   ?Chief Complaint  ?Patient presents with  ? Follow-up  ?  Patient here to go over CT and PET scan. Patient also had PFT done. Is scheduled for Bronch Thursday. Increased Productive cough with clear sputum and feels like breathing is better.  ? Dyspnea:  MMRC1 = can walk nl pace, flat grade, can't hurry or go uphills or steps s  sob   ?Cough: cough after hs  ?Sleeping: bed is flat/ on side / 2 pillows  ?SABA use: a lot less now  ?02:  2lpm hs  and when walks > 90%  ?Covid status: vax 3  ?  ? ? ?No obvious day to day or daytime variability or assoc excess/ purulent sputum or mucus plugs or hemoptysis or cp or chest tightness, subjective wheeze or overt sinus or hb symptoms.  ? ?Sleeping as above without nocturnal  or early am exacerbation  of respiratory  c/o's or need for noct saba. Also denies any obvious fluctuation of symptoms with weather or environmental changes or other aggravating or alleviating factors except as outlined above  ? ?No unusual exposure hx or h/o childhood pna/ asthma or knowledge of premature birth. ? ?Current Allergies, Complete Past Medical History, Past Surgical History, Family History, and Social History were reviewed in [employer] record. ? ?ROS  The following are not active complaints unless bolded ?Hoarseness, sore throat, dysphagia, dental problems, itching, sneezing,  nasal congestion or discharge of excess mucus or purulent secretions, ear ache,   fever, chills, sweats, unintended wt loss or wt gain, classically pleuritic or exertional  cp,  orthopnea pnd or arm/hand swelling  or leg swelling, presyncope, palpitations, abdominal pain, anorexia, nausea, vomiting, diarrhea  or change in bowel habits or change in bladder habits, change in stools or change in urine, dysuria, hematuria,  rash, arthralgias, visual complaints, headache, numbness, weakness or ataxia or problems with walking or coordination,  change in mood or  memory. ?      ? ?Current Meds  ?Medication Sig  ? acetaminophen (TYLENOL) 325 MG tablet Take 2 tablets (650 mg total) by mouth every 6 (six) hours as needed for mild pain (or Fever >/= 101).  ? albuterol (PROVENTIL) (2.5 MG/3ML) 0.083% nebulizer solution Take 3 mLs by nebulization every 6 (six) hours as needed for wheezing or shortness of breath.  ? albuterol (VENTOLIN  HFA) 108 (90 Base) MCG/ACT inhaler Inhale 2 puffs into the lungs every 6 (six) hours as needed for wheezing or shortness of breath.  ? apixaban (ELIQUIS) 5 MG TABS tablet Take 1 tablet (5 mg total) by mouth 2 (two) times daily.  ? atorvastatin (LIPITOR) 20 MG tablet Take 1 tablet (20 mg total) by mouth every evening.  ? bisoprolol (ZEBETA) 5 MG tablet Take 1 tablet (5 mg total) by mouth daily. For BP and Heart Rate Control  ? Budeson-Glycopyrrol-Formoterol (BREZTRI AEROSPHERE) 160-9-4.8 MCG/ACT AERO Inhale 2 puffs into the lungs daily.  ? Budeson-Glycopyrrol-Formoterol (BREZTRI AEROSPHERE) 160-9-4.8 MCG/ACT AERO Inhale 2 puffs into the lungs in the morning and at bedtime.  ? glimepiride (AMARYL) 2 MG tablet Take 1 tablet (2 mg total) by mouth every morning.  ? Multiple Vitamin (MULTI-VITAMIN DAILY PO) Take 1 tablet by mouth daily.  ? nicotine (NICODERM CQ - DOSED IN MG/24 HOURS) 21 mg/24hr patch Place 1 patch (21 mg total) onto the skin daily.  ? potassium chloride (KLOR-CON) 10 MEQ tablet Take 1 tablet (10 mEq total) by mouth daily. Take While taking Lasix/furosemide  ? torsemide 40 MG TABS Take 40 mg by mouth every morning.  ?     ?  ? ? ?Objective:  ?  ?Wt Readings from Last 3 Encounters:  ?08/24/21 264 lb 12.8 oz (120.1 kg)  ?07/08/21 264 lb (119.7 kg)  ?06/05/21 259 lb 0.7 oz (117.5 kg)  ?  ? ? ?Vital signs reviewed  08/24/2021  - Note at rest 02 sats  94% on RA  ? ?General appearance:    pleasant obese wm nad    ? ?HEENT : pt wearing mask not removed for exam due to covid - 19 concerns.  ? ? ?NECK :  without JVD/Nodes/TM/ nl carotid upstrokes bilaterally ? ? ?LUNGS: no acc muscle use,  Mild barrel  contour chest wall with bilateral  Distant bs s audible wheeze and  without cough on insp or exp maneuvers  and mild  Hyperresonant  to  percussion bilaterally   ? ? ?CV:  RRR  no s3 or murmur or increase in P2, and no edema  ? ?ABD:  quite obese soft and nontender with  limited insp excursion supine but not paradox.  No bruits or organomegaly appreciated, bowel sounds nl ? ?MS:   Nl gait/  ext warm without deformities, calf tenderness, cyanosis or clubbing ?No obvious joint restrictions  ? ?SKIN: warm and dry without lesions   ? ?NEURO:  alert, approp, nl sensorium with  no motor or cerebellar deficits apparent.  ?    ?  ?    ?  ?   ?Assessment  ? ?  ? ?   ? ? ?  ?   ?

## 2021-08-24 ENCOUNTER — Ambulatory Visit: Payer: Medicare HMO | Admitting: Internal Medicine

## 2021-08-24 ENCOUNTER — Encounter (HOSPITAL_COMMUNITY): Payer: Self-pay | Admitting: Pulmonary Disease

## 2021-08-24 ENCOUNTER — Encounter: Payer: Self-pay | Admitting: Internal Medicine

## 2021-08-24 DIAGNOSIS — R918 Other nonspecific abnormal finding of lung field: Secondary | ICD-10-CM

## 2021-08-24 DIAGNOSIS — J9611 Chronic respiratory failure with hypoxia: Secondary | ICD-10-CM

## 2021-08-24 DIAGNOSIS — J9612 Chronic respiratory failure with hypercapnia: Secondary | ICD-10-CM | POA: Diagnosis not present

## 2021-08-24 DIAGNOSIS — J449 Chronic obstructive pulmonary disease, unspecified: Secondary | ICD-10-CM | POA: Diagnosis not present

## 2021-08-24 DIAGNOSIS — F17210 Nicotine dependence, cigarettes, uncomplicated: Secondary | ICD-10-CM

## 2021-08-24 DIAGNOSIS — R69 Illness, unspecified: Secondary | ICD-10-CM | POA: Diagnosis not present

## 2021-08-24 NOTE — Patient Instructions (Signed)
Work on inhaler technique:  relax and gently blow all the way out then take a nice smooth full deep breath back in, triggering the inhaler at same time you start breathing in.  Hold for up to 5 seconds if you can. Blow out thru nose. Rinse and gargle with water when done.  If mouth or throat bother you at all,  try brushing teeth/gums/tongue with arm and hammer toothpaste/ make a slurry and gargle and spit out.  ? ?   ?Keep walking and tracking your 02 saturation at peak exercise NOT after you stop with the goal is keep it over 90%  ? ?The key is to stop smoking completely before smoking completely stops you! ? ?  ?Please schedule a follow up visit in 3 months but call sooner if needed  with all  inhalers solutions in hand  ? ?

## 2021-08-24 NOTE — Assessment & Plan Note (Addendum)
Active smoker/MM  ?- 07/08/2021  After extensive coaching inhaler device,  effectiveness =    75% try breztri samples and f/u with pfts ?- 07/08/21  Alpha one AT  MM/ level  220  ?- 07/08/21  Eos 0.1  ?- PFT's  07/22/2021   FEV1 1.80 (49 % ) ratio 0.70  p 3 % improvement from saba p brezti x one puff  prior to study with DLCO  28.26 (39%)   FV curve mild concavity   ? ? Group D in terms of symptom/risk and laba/lama/ICS  therefore appropriate rx at this point >>>  Continue breztri 2bid and prn saba  ? ?- The proper method of use, as well as anticipated side effects, of a metered-dose inhaler were discussed and demonstrated to the patient using teach back method.  ? ?rec f/u in 3 m with iinhalers  in hand using a trust but verify approach to confirm accurate Medication  Reconciliation and technique.  The principal here is that until we are certain that the  patients are doing what we've asked, it makes no sense to ask them to do more.  ?  ?

## 2021-08-24 NOTE — Assessment & Plan Note (Signed)
Counseled re importance of smoking cessation but did not meet time criteria for separate billing   ? ?    ?  ? ?Each maintenance medication was reviewed in detail including emphasizing most importantly the difference between maintenance and prns and under what circumstances the prns are to be triggered using an action plan format where appropriate. ? ?Total time for H and P, chart review, counseling, reviewing hfa device(s) and generating customized AVS unique to this office visit / same day charting = 20 min  ?     ? ? ?

## 2021-08-24 NOTE — Pre-Procedure Instructions (Signed)
PCP - [place] ?Cardiologist - [facility] ? ?EKG - 06/05/21 ?Chest x-ray - 07/23/21 (CT) ?ECHO - 06/01/21 ?Cardiac Cath - n/a ?CPAP - n/a ? ?Fasting Blood Sugar:  111 ?Checks Blood Sugar:  2/week ? ?Blood Thinner Instructions: Eliquis stoped 08/22/21 ?Aspirin Instructions: n/a ? ?ERAS Protcol - yes ?COVID TEST- yes ? ?Anesthesia review: yes- hospital admit Jan for SOB/palpitations ? ?------------- ? ?SDW INSTRUCTIONS: ? ?Your procedure is scheduled on 08/26/21. Please report to [facility] Entr[facility] "A" at 0530 A.M., and check in at the Admitting office. Call this number if you have problems the morning of surgery: [phone] ? ? ?Remember: Do not eat after midnight the night before your surgery ? ?You may drink clear liquids until 0430 the morning of your surgery.   ?Clear liquids allowed are: Water, Non-Citrus Juices (without pulp), Carbonated Beverages, Clear Tea, Black Coffee Only, and Gatorade ?  ?Medications to take morning of surgery with a sip of water include: ?atorvostatin, bisoprolol, breztri inhaler. ? ?Please bring all inhalers with you the day of surgery.   ? ?As of today, STOP taking any Aspirin (unless otherwise instructed by your surgeon), Aleve, Naproxen, Ibuprofen, Motrin, Advil, Goody's, BC's, all herbal medications, fish oil, and all vitamins. ? ?  ?The Morning of Surgery ?Do not wear jewelry, make-up or nail polish. ?Do not wear lotions, powders, or perfumes/colognes, or deodorant ?Do not bring valuables to the hospital. ? is not responsible for any belongings or valuables. ? ?If you are a smoker, DO NOT Smoke 24 hours prior to surgery ? ?If you wear a CPAP at night please bring your mask the morning of surgery  ? ?Remember that you must have someone to transport you home after your surgery, and remain with you for 24 hours if you are discharged the same day. ? ?Please bring cases for contacts, glasses, hearing aids, dentures or bridgework because it cannot  be worn into surgery.  ? ?Patients discharged the day of surgery will not be allowed to drive home.  ? ?Please shower the NIGHT BEFORE/MORNING OF SURGERY (use antibacterial soap like DIAL soap if possible). Wear comfortable clothes the morning of surgery. Oral Hygiene is also important to reduce your risk of infection.  Remember - BRUSH YOUR TEETH THE MORNING OF SURGERY WITH YOUR REGULAR TOOTHPASTE ? ?Patient denies shortness of breath, fever, cough and chest pain.  ? ? ?   ? ?

## 2021-08-24 NOTE — Assessment & Plan Note (Signed)
HC03   06/05/21   =   38  ?- 07/08/2021  patient walked at a moderate pace on room air x 300 ft. At end  desat to 88%. Placed on 2LO2 cont. patient sats increased to 93% for another 150 ft  ? ?Reports improved and no longer desats walking so rec continue to monitor at peak ex and  2lpm hs  ?

## 2021-08-24 NOTE — Assessment & Plan Note (Signed)
1st noted on cxr 05/31/21 on admit for chf ?- CT 07/23/21 Rounded masslike area in the left upper lobe measuring up to 5.6 cm. ?Appearance is concerning for lung cancer. Associated left hilar ?adenopathy. Borderline sized mediastinal lymph nodes>>> PET 08/12/21  c/w Stage 3b lung ca > referred for fob/ebus (Dr Windell Norfolk)  ? ?Discussed in detail all the  indications, usual  risks and alternatives  relative to the benefits with patient who agrees to proceed with w/u as outlined.    ? ? ?

## 2021-08-25 ENCOUNTER — Encounter (HOSPITAL_COMMUNITY): Payer: Self-pay | Admitting: Pulmonary Disease

## 2021-08-25 NOTE — Anesthesia Preprocedure Evaluation (Addendum)
Anesthesia Evaluation  ?Patient identified by MRN, date of birth, ID band ?Patient awake ? ? ? ?Reviewed: ?Allergy & Precautions, NPO status , Patient's Chart, lab work & pertinent test results ? ?Airway ?Mallampati: II ? ?TM Distance: >3 FB ?Neck ROM: Full ? ? ? Dental ?no notable dental hx. ? ?  ?Pulmonary ?COPD, Current Smoker and Patient abstained from smoking.,  ?  ?Pulmonary exam normal ?breath sounds clear to auscultation ? ? ? ? ? ? Cardiovascular ?hypertension, Pt. on medications ?+ CAD and +CHF  ?Normal cardiovascular exam ?Rhythm:Regular Rate:Normal ? ? ?  ?Neuro/Psych ?negative neurological ROS ? negative psych ROS  ? GI/Hepatic ?negative GI ROS, Neg liver ROS,   ?Endo/Other  ?diabetes, Type 2 ? Renal/GU ?Renal InsufficiencyRenal disease  ?negative genitourinary ?  ?Musculoskeletal ?negative musculoskeletal ROS ?(+)  ? Abdominal ?(+) + obese,   ?Peds ?negative pediatric ROS ?(+)  Hematology ?negative hematology ROS ?(+)   ?Anesthesia Other Findings ? ? Reproductive/Obstetrics ?negative OB ROS ? ?  ? ? ? ? ? ? ? ? ? ? ? ? ? ?  ?  ? ? ? ? ? ? ? ?Anesthesia Physical ?Anesthesia Plan ? ?ASA: 3 ? ?Anesthesia Plan: General  ? ?Post-op Pain Management:   ? ?Induction: Intravenous ? ?PONV Risk Score and Plan: 1 and Ondansetron and Treatment may vary due to age or medical condition ? ?Airway Management Planned: Oral ETT ? ?Additional Equipment:  ? ?Intra-op Plan:  ? ?Post-operative Plan: Extubation in OR ? ?Informed Consent: I have reviewed the patients History and Physical, chart, labs and discussed the procedure including the risks, benefits and alternatives for the proposed anesthesia with the patient or authorized representative who has indicated his/her understanding and acceptance.  ? ? ? ?Dental advisory given ? ?Plan Discussed with: CRNA ? ?Anesthesia Plan Comments: (PAT note written 08/25/2021 by Myra Gianotti, PA-C. ?)  ? ? ? ? ? ?Anesthesia Quick Evaluation ? ?

## 2021-08-25 NOTE — Progress Notes (Signed)
Anesthesia Chart Review: SAME DAY WORK-UP ? ? Case: [id number] Date/Time: 08/26/21 0730  ? Procedure: VIDEO BRONCHOSCOPY WITH ENDOBRONCHIAL ULTRASOUND (Bilateral) - will need Guardant [id number]  ? Anesthesia type: General  ? Diagnosis: Lung mass [R91.8]  ? Pre-op diagnosis: lung mass, adenopathy  ? Location: [facility] / [facility]  ? Surgeons: Garner Nash, DO  ? ?  ? ? ?DISCUSSION: Patient is a 66 year old male scheduled for the above procedure. ? ?History includes smoking, HTN, CHF, atypical a-flutter (s/p DCCV 06/03/21), colon cancer (s/p partial colectomy 2014), varicose veins (s/p laser ablation left GSV 2020) ? ?[place] admission 05/31/21-06/05/21 for palpitations with diagnosis of aflutter with RVR with acute on chronic diastolic HF and hypoxemia. He was placed on 6L O2 and given IVCardizem and Lasix. Cardology consulted. Echo showed LVEF 60-65% with evidence of volume overload. He ultimately required TEE with DCCV 06/03/21.  Clinically improved, but required O2 at 4 L at discharge. Follow-up CXR 06/05/21 showed mild increase in left basilar opacities, consider infection or edema. Discharge medications included bronchodilators, furosemide, KCl, bisoprolol, and apixaban. He declined nicotine patch. He had referral to pulmonologist Dr. [name] afterwards with follow-up CXR which lead to chest CT and PET scan showing hypermetabolic lingular mass. Biopsy recommended for tissue diagnosis.  He currently uses home O2 2 L at night and with activity as needed. ? ?Dr. Valeta Harms advised hold Eliquis after 08/23/21 dose. Eliquis stopped 08/22/21 per 08/24/21 PAT RN documentation. He will be 12 weeks s/p DCCV by surgery date. He has hospital follow-up with cardiologist Dr. Harl Bowie scheduled for 08/27/21. ? ?He is a same-day work-up, so he is for labs as indicated and anesthesia team evaluation on the day of surgery. ? ? ?VS: Ht 6' (1.829 m)   Wt 117.9 kg   BMI 35.26 kg/m?  ?BP Readings from Last 3 Encounters:  ?08/24/21  118/70  ?07/08/21 138/86  ?06/05/21 106/77  ? ?Pulse Readings from Last 3 Encounters:  ?08/24/21 (!) 103  ?07/08/21 96  ?06/05/21 75  ?  ? ?PROVIDERS: ?Ginger Organ is PCP [facility]) ?Christinia Gully, MD is pulmonologist ?Carlyle Dolly, MD is cardiologist ? ? ?LABS: For day of surgery as indicated. ? ? ?IMAGES: ?PET Scan 08/12/21: ?IMPRESSION: ?1. Hypermetabolic lingular mass with hypermetabolic ipsilateral ?mediastinal/hilar adenopathy, consistent with primary bronchogenic ?carcinoma, T3 N2 M0 or stage IIIB disease. ?2. Right renal stones. ?3. Aortic atherosclerosis (ICD10-I70.0). Coronary artery ?calcification. ?4. Enlarged pulmonic trunk, indicative of pulmonary arterial ?hypertension. ?5.  Emphysema (ICD10-J43.9). ?  ?CT Chest 07/23/21: ?IMPRESSION: ?- Rounded masslike area in the left upper lobe measuring up to 5.6 cm. ?Appearance is concerning for lung cancer. Associated left hilar ?adenopathy. Borderline sized mediastinal lymph nodes. ?- Coronary artery disease. ?- Nonobstructing right nephrolithiasis. ?- Aortic Atherosclerosis (ICD10-I70.0). ? ? ?EKG: 06/05/21: ?Normal sinus rhythm ?Incomplete right bundle branch block ?Borderline ECG ?When compared with ECG of 04-Jun-2021 04:33, ?PREVIOUS ECG IS PRESENT ?Premature ventricular complexes RESOLVED SINCE PREVIOUS ?Confirmed by Kirk Ruths [phone]) on 06/05/2021 4:40:06 PM ? ? ?CV: ?TEE 06/03/21: ?IMPRESSIONS  ? 1. Left ventricular ejection fraction, by estimation, is 55 to 60%. The  ?left ventricle has normal function. The left ventricle has no regional  ?wall motion abnormalities.  ? 2. Right ventricular systolic function was not well visualized. The right  ?ventricular size is normal.  ? 3. Left atrial size was moderately dilated. No left atrial/left atrial  ?appendage thrombus was detected. The LAA emptying velocity was 60 cm/s.  ?  4. The mitral valve is grossly normal. Mild mitral valve regurgitation.  ? 5. Tricuspid valve  regurgitation is moderate.  ? 6. The aortic valve is tricuspid. There is mild calcification of the  ?aortic valve. Aortic valve regurgitation is not visualized.  ? 7. There is Moderate (Grade III) plaque involving the descending aorta.  ? ?TTE 06/01/21: ?IMPRESSIONS  ? 1. Left ventricular ejection fraction, by estimation, is 60 to 65%. The  ?left ventricle has normal function. The left ventricle has no regional  ?wall motion abnormalities. There is mild left ventricular hypertrophy.  ?Left ventricular diastolic parameters  ?are indeterminate. There is the interventricular septum is flattened in  ?systole and diastole, consistent with right ventricular pressure and  ?volume overload.  ? 2. Right ventricular systolic function is mildly reduced. The right  ?ventricular size is mildly enlarged. There is moderately elevated  ?pulmonary artery systolic pressure. The estimated right ventricular  ?systolic pressure is 54.0 mmHg.  ? 3. Left atrial size was mildly dilated.  ? 4. Right atrial size was mildly dilated.  ? 5. The mitral valve is grossly normal. Trivial mitral valve  ?regurgitation.  ? 6. Tricuspid valve regurgitation is moderate.  ? 7. The aortic valve is tricuspid. There is moderate calcification of the  ?aortic valve. Aortic valve regurgitation is not visualized. Aortic valve  ?sclerosis/calcification is present, without any evidence of aortic  ?stenosis. Aortic valve mean gradient  ?measures 6.0 mmHg.  ? 8. Aortic dilatation noted. There is mild dilatation of the aortic root,  ?measuring 41 mm.  ? 9. The inferior vena cava is dilated in size with <50% respiratory  ?variability, suggesting right atrial pressure of 15 mmHg.  ?- Comparison(s): Prior images reviewed side by side. LVEF remains normal  ?range. There is mild RV dysfunction with evidence of moderate pulmonary  ?hypertension.  ? ?Nuclear stress test 02/02/18: ?Blood pressure demonstrated a hypertensive response to exercise. ?There was no ST segment  deviation noted during stress. ?Defect 1: There is a medium defect of moderate severity present in the mid inferoseptal, mid inferior and apical inferior location. As regional wall motion appears grossly normal, this appears to be due to soft tissue attenuation/artifact. ?This is a low risk study. No ischemia or scar. ?Nuclear stress EF: 61%. ? ?Past Medical History:  ?Diagnosis Date  ? Atrial flutter ([place])   ? s/p DCCV 06/03/21  ? CHF (congestive heart failure) ([place])   ? Colon cancer ([place]) 04/22/2013  ? Hypertension   ? Renal disorder   ? Kidney stones  ? ? ?Past Surgical History:  ?Procedure Laterality Date  ? CARDIOVERSION N/A 06/03/2021  ? Procedure: CARDIOVERSION;  Surgeon: Satira Sark, MD;  Location: [facility] ORS;  Service: Cardiovascular;  Laterality: N/A;  ? COLONOSCOPY N/A 04/29/2013  ? Procedure: COLONOSCOPY;  Surgeon: Danie Binder, MD;  Location: [facility] SUITE;  Service: Endoscopy;  Laterality: N/A;  10:30AM  ? Cyst removed from left elbow    ? ENDOVENOUS ABLATION SAPHENOUS VEIN W/ LASER Left 01/16/2019  ? endovenous laser ablation left greater saphenous vein by Ruta Hinds MD  ? KIDNEY STONE SURGERY    ? LITHOTRIPSY    ? PARTIAL COLECTOMY    ? TEE WITHOUT CARDIOVERSION N/A 06/03/2021  ? Procedure: TRANSESOPHAGEAL ECHOCARDIOGRAM (TEE);  Surgeon: Satira Sark, MD;  Location: [facility] ORS;  Service: Cardiovascular;  Laterality: N/A;  ? ? ?MEDICATIONS: ?No current facility-administered medications for this encounter.  ? ? acetaminophen (TYLENOL) 325 MG tablet  ? albuterol (PROVENTIL) (2.5  MG/3ML) 0.083% nebulizer solution  ? albuterol (VENTOLIN HFA) 108 (90 Base) MCG/ACT inhaler  ? apixaban (ELIQUIS) 5 MG TABS tablet  ? atorvastatin (LIPITOR) 20 MG tablet  ? bisoprolol (ZEBETA) 5 MG tablet  ? Budeson-Glycopyrrol-Formoterol (BREZTRI AEROSPHERE) 160-9-4.8 MCG/ACT AERO  ? glimepiride (AMARYL) 2 MG tablet  ? Multiple Vitamin (MULTI-VITAMIN DAILY PO)  ? potassium chloride (KLOR-CON) 10 MEQ tablet  ? torsemide  40 MG TABS  ? Budeson-Glycopyrrol-Formoterol (BREZTRI AEROSPHERE) 160-9-4.8 MCG/ACT AERO  ? nicotine (NICODERM CQ - DOSED IN MG/24 HOURS) 21 mg/24hr patch  ? ? ?Myra Gianotti, PA-C ?Surgical Short Stay/Anesthesiology

## 2021-08-26 ENCOUNTER — Ambulatory Visit (HOSPITAL_COMMUNITY): Payer: Medicare HMO | Admitting: Vascular Surgery

## 2021-08-26 ENCOUNTER — Ambulatory Visit (HOSPITAL_COMMUNITY)
Admission: RE | Admit: 2021-08-26 | Discharge: 2021-08-26 | Disposition: A | Discharge status: Home / Self Care | Payer: Medicare HMO | Attending: Pulmonary Disease | Admitting: Pulmonary Disease

## 2021-08-26 ENCOUNTER — Encounter (HOSPITAL_COMMUNITY): Payer: Self-pay | Admitting: Pulmonary Disease

## 2021-08-26 ENCOUNTER — Encounter (HOSPITAL_COMMUNITY)
Admission: RE | Disposition: A | Discharge status: Home / Self Care | Payer: Self-pay | Source: Home / Self Care | Attending: Pulmonary Disease

## 2021-08-26 ENCOUNTER — Other Ambulatory Visit: Payer: Self-pay

## 2021-08-26 ENCOUNTER — Ambulatory Visit (HOSPITAL_BASED_OUTPATIENT_CLINIC_OR_DEPARTMENT_OTHER): Payer: Medicare HMO | Admitting: Vascular Surgery

## 2021-08-26 DIAGNOSIS — I509 Heart failure, unspecified: Secondary | ICD-10-CM | POA: Diagnosis not present

## 2021-08-26 DIAGNOSIS — C3412 Malignant neoplasm of upper lobe, left bronchus or lung: Secondary | ICD-10-CM | POA: Insufficient documentation

## 2021-08-26 DIAGNOSIS — R918 Other nonspecific abnormal finding of lung field: Secondary | ICD-10-CM | POA: Diagnosis not present

## 2021-08-26 DIAGNOSIS — Z85038 Personal history of other malignant neoplasm of large intestine: Secondary | ICD-10-CM | POA: Insufficient documentation

## 2021-08-26 DIAGNOSIS — Z9981 Dependence on supplemental oxygen: Secondary | ICD-10-CM | POA: Insufficient documentation

## 2021-08-26 DIAGNOSIS — J439 Emphysema, unspecified: Secondary | ICD-10-CM | POA: Diagnosis not present

## 2021-08-26 DIAGNOSIS — I2510 Atherosclerotic heart disease of native coronary artery without angina pectoris: Secondary | ICD-10-CM | POA: Insufficient documentation

## 2021-08-26 DIAGNOSIS — R599 Enlarged lymph nodes, unspecified: Secondary | ICD-10-CM

## 2021-08-26 DIAGNOSIS — Z7984 Long term (current) use of oral hypoglycemic drugs: Secondary | ICD-10-CM | POA: Insufficient documentation

## 2021-08-26 DIAGNOSIS — I5032 Chronic diastolic (congestive) heart failure: Secondary | ICD-10-CM | POA: Insufficient documentation

## 2021-08-26 DIAGNOSIS — C3490 Malignant neoplasm of unspecified part of unspecified bronchus or lung: Secondary | ICD-10-CM

## 2021-08-26 DIAGNOSIS — F17200 Nicotine dependence, unspecified, uncomplicated: Secondary | ICD-10-CM | POA: Insufficient documentation

## 2021-08-26 DIAGNOSIS — E119 Type 2 diabetes mellitus without complications: Secondary | ICD-10-CM | POA: Insufficient documentation

## 2021-08-26 DIAGNOSIS — R69 Illness, unspecified: Secondary | ICD-10-CM | POA: Diagnosis not present

## 2021-08-26 DIAGNOSIS — Z9049 Acquired absence of other specified parts of digestive tract: Secondary | ICD-10-CM | POA: Insufficient documentation

## 2021-08-26 DIAGNOSIS — Z6835 Body mass index (BMI) 35.0-35.9, adult: Secondary | ICD-10-CM | POA: Insufficient documentation

## 2021-08-26 DIAGNOSIS — E669 Obesity, unspecified: Secondary | ICD-10-CM | POA: Insufficient documentation

## 2021-08-26 DIAGNOSIS — I110 Hypertensive heart disease with heart failure: Secondary | ICD-10-CM | POA: Diagnosis not present

## 2021-08-26 HISTORY — DX: Malignant neoplasm of unspecified part of unspecified bronchus or lung: C34.90

## 2021-08-26 HISTORY — PX: VIDEO BRONCHOSCOPY WITH ENDOBRONCHIAL ULTRASOUND: SHX6177

## 2021-08-26 HISTORY — DX: Heart failure, unspecified: I50.9

## 2021-08-26 HISTORY — PX: BRONCHIAL NEEDLE ASPIRATION BIOPSY: SHX5106

## 2021-08-26 HISTORY — PX: BRONCHIAL BIOPSY: SHX5109

## 2021-08-26 HISTORY — PX: BRONCHIAL BRUSHINGS: SHX5108

## 2021-08-26 HISTORY — DX: Unspecified atrial flutter: I48.92

## 2021-08-26 LAB — CBC
HCT: 40.4 % (ref 39.0–52.0)
Hemoglobin: 13.3 g/dL (ref 13.0–17.0)
MCH: 32.8 pg (ref 26.0–34.0)
MCHC: 32.9 g/dL (ref 30.0–36.0)
MCV: 99.8 fL (ref 80.0–100.0)
Platelets: 225 10*3/uL (ref 150–400)
RBC: 4.05 MIL/uL — ABNORMAL LOW (ref 4.22–5.81)
RDW: 14.6 % (ref 11.5–15.5)
WBC: 12.1 10*3/uL — ABNORMAL HIGH (ref 4.0–10.5)
nRBC: 0 % (ref 0.0–0.2)

## 2021-08-26 LAB — BASIC METABOLIC PANEL
Anion gap: 4 — ABNORMAL LOW (ref 5–15)
BUN: 12 mg/dL (ref 8–23)
CO2: 33 mmol/L — ABNORMAL HIGH (ref 22–32)
Calcium: 9 mg/dL (ref 8.9–10.3)
Chloride: 100 mmol/L (ref 98–111)
Creatinine, Ser: 1.06 mg/dL (ref 0.61–1.24)
GFR, Estimated: >60 mL/min (ref >60)
Glucose, Bld: 118 mg/dL — ABNORMAL HIGH (ref 70–99)
Potassium: 3.9 mmol/L (ref 3.5–5.1)
Sodium: 137 mmol/L (ref 135–145)

## 2021-08-26 LAB — GLUCOSE, CAPILLARY
Glucose-Capillary: 110 mg/dL — ABNORMAL HIGH (ref 70–99)
Glucose-Capillary: 117 mg/dL — ABNORMAL HIGH (ref 70–99)

## 2021-08-26 SURGERY — BRONCHOSCOPY, WITH EBUS
Anesthesia: General | Laterality: Bilateral

## 2021-08-26 MED ORDER — PHENYLEPHRINE HCL-NACL 20-0.9 MG/250ML-% IV SOLN
INTRAVENOUS | Status: DC | PRN
  Administered 2021-08-26: 25 ug/min via INTRAVENOUS

## 2021-08-26 MED ORDER — FENTANYL CITRATE (PF) 250 MCG/5ML IJ SOLN
INTRAMUSCULAR | Status: AC
  Filled 2021-08-26: qty 5

## 2021-08-26 MED ORDER — CHLORHEXIDINE GLUCONATE 0.12 % MT SOLN
OROMUCOSAL | Status: AC
  Administered 2021-08-26: 15 mL via OROMUCOSAL
  Filled 2021-08-26: qty 15

## 2021-08-26 MED ORDER — PROPOFOL 10 MG/ML IV BOLUS
INTRAVENOUS | Status: DC | PRN
  Administered 2021-08-26: 150 mg via INTRAVENOUS

## 2021-08-26 MED ORDER — CHLORHEXIDINE GLUCONATE 0.12 % MT SOLN: 15.0000 mL | Freq: Once | OROMUCOSAL | Status: AC

## 2021-08-26 MED ORDER — PROMETHAZINE HCL 25 MG/ML IJ SOLN: 6.2500 mg | INTRAMUSCULAR | Status: DC | PRN

## 2021-08-26 MED ORDER — MIDAZOLAM HCL 2 MG/2ML IJ SOLN
INTRAMUSCULAR | Status: DC | PRN
  Administered 2021-08-26: 2 mg via INTRAVENOUS

## 2021-08-26 MED ORDER — INSULIN ASPART 100 UNIT/ML IJ SOLN: 0.0000 [IU] | INTRAMUSCULAR | Status: DC | PRN

## 2021-08-26 MED ORDER — ONDANSETRON HCL 4 MG/2ML IJ SOLN
INTRAMUSCULAR | Status: DC | PRN
  Administered 2021-08-26: 4 mg via INTRAVENOUS

## 2021-08-26 MED ORDER — ROCURONIUM BROMIDE 10 MG/ML (PF) SYRINGE
PREFILLED_SYRINGE | INTRAVENOUS | Status: DC | PRN
Start: 2021-08-26 — End: 2021-08-26
  Administered 2021-08-26: 100 mg via INTRAVENOUS

## 2021-08-26 MED ORDER — LACTATED RINGERS IV SOLN: INTRAVENOUS | Status: DC

## 2021-08-26 MED ORDER — PROPOFOL 500 MG/50ML IV EMUL
INTRAVENOUS | Status: DC | PRN
  Administered 2021-08-26: 100 ug/kg/min via INTRAVENOUS

## 2021-08-26 MED ORDER — BISOPROLOL FUMARATE 5 MG PO TABS
5.0000 mg | ORAL_TABLET | Freq: Once | ORAL | Status: AC
  Administered 2021-08-26: 5 mg via ORAL
  Filled 2021-08-26: qty 1

## 2021-08-26 MED ORDER — OXYCODONE HCL 5 MG/5ML PO SOLN: 5.0000 mg | Freq: Once | ORAL | Status: DC | PRN

## 2021-08-26 MED ORDER — LIDOCAINE 2% (20 MG/ML) 5 ML SYRINGE
INTRAMUSCULAR | Status: DC | PRN
Start: 2021-08-26 — End: 2021-08-26
  Administered 2021-08-26: 100 mg via INTRAVENOUS

## 2021-08-26 MED ORDER — FENTANYL CITRATE (PF) 250 MCG/5ML IJ SOLN
INTRAMUSCULAR | Status: DC | PRN
  Administered 2021-08-26: 100 ug via INTRAVENOUS

## 2021-08-26 MED ORDER — AMISULPRIDE (ANTIEMETIC) 5 MG/2ML IV SOLN: 10.0000 mg | Freq: Once | INTRAVENOUS | Status: DC | PRN

## 2021-08-26 MED ORDER — DEXAMETHASONE SODIUM PHOSPHATE 10 MG/ML IJ SOLN
INTRAMUSCULAR | Status: DC | PRN
Start: 2021-08-26 — End: 2021-08-26
  Administered 2021-08-26: 10 mg via INTRAVENOUS

## 2021-08-26 MED ORDER — OXYCODONE HCL 5 MG PO TABS: 5.0000 mg | ORAL_TABLET | Freq: Once | ORAL | Status: DC | PRN

## 2021-08-26 MED ORDER — SUGAMMADEX SODIUM 200 MG/2ML IV SOLN
INTRAVENOUS | Status: DC | PRN
  Administered 2021-08-26: 300 mg via INTRAVENOUS

## 2021-08-26 MED ORDER — PROPOFOL 10 MG/ML IV BOLUS
INTRAVENOUS | Status: AC
  Filled 2021-08-26: qty 20

## 2021-08-26 MED ORDER — HYDROMORPHONE HCL 1 MG/ML IJ SOLN: 0.2500 mg | INTRAMUSCULAR | Status: DC | PRN

## 2021-08-26 MED ORDER — MIDAZOLAM HCL 2 MG/2ML IJ SOLN
INTRAMUSCULAR | Status: AC
  Filled 2021-08-26: qty 2

## 2021-08-26 SURGICAL SUPPLY — 30 items
BRUSH CYTOL CELLEBRITY 1.5X140 (MISCELLANEOUS) IMPLANT
CANISTER SUCT 3000ML PPV (MISCELLANEOUS) ×4 IMPLANT
CONT SPEC 4OZ CLIKSEAL STRL BL (MISCELLANEOUS) ×4 IMPLANT
COVER BACK TABLE 60X90IN (DRAPES) ×4 IMPLANT
COVER DOME SNAP 22 D (MISCELLANEOUS) ×4 IMPLANT
FORCEPS BIOP RJ4 1.8 (CUTTING FORCEPS) IMPLANT
GAUZE SPONGE 4X4 12PLY STRL (GAUZE/BANDAGES/DRESSINGS) ×4 IMPLANT
GLOVE BIO SURGEON STRL SZ7.5 (GLOVE) ×4 IMPLANT
GOWN STRL REUS W/ TWL LRG LVL3 (GOWN DISPOSABLE) ×3 IMPLANT
GOWN STRL REUS W/TWL LRG LVL3 (GOWN DISPOSABLE) ×3
KIT CLEAN ENDO COMPLIANCE (KITS) ×8 IMPLANT
KIT TURNOVER KIT B (KITS) ×4 IMPLANT
MARKER SKIN DUAL TIP RULER LAB (MISCELLANEOUS) ×4 IMPLANT
NDL EBUS SONO TIP PENTAX (NEEDLE) ×3 IMPLANT
NEEDLE EBUS SONO TIP PENTAX (NEEDLE) ×3 IMPLANT
NS IRRIG 1000ML POUR BTL (IV SOLUTION) ×4 IMPLANT
OIL SILICONE PENTAX (PARTS (SERVICE/REPAIRS)) ×4 IMPLANT
PAD ARMBOARD 7.5X6 YLW CONV (MISCELLANEOUS) ×8 IMPLANT
SOL ANTI FOG 6CC (MISCELLANEOUS) ×3 IMPLANT
SOLUTION ANTI FOG 6CC (MISCELLANEOUS) ×1
SYR 20CC LL (SYRINGE) ×8 IMPLANT
SYR 20ML ECCENTRIC (SYRINGE) ×8 IMPLANT
SYR 50ML SLIP (SYRINGE) IMPLANT
SYR 5ML LUER SLIP (SYRINGE) ×4 IMPLANT
TOWEL OR 17X24 6PK STRL BLUE (TOWEL DISPOSABLE) ×4 IMPLANT
TRAP SPECIMEN MUCOUS 40CC (MISCELLANEOUS) IMPLANT
TUBE CONNECTING 20X1/4 (TUBING) ×8 IMPLANT
UNDERPAD 30X30 (UNDERPADS AND DIAPERS) ×4 IMPLANT
VALVE DISPOSABLE (MISCELLANEOUS) ×4 IMPLANT
WATER STERILE IRR 1000ML POUR (IV SOLUTION) ×4 IMPLANT

## 2021-08-26 NOTE — Op Note (Signed)
Video Bronchoscopy with Endobronchial Ultrasound Procedure Note ? ?Date of Operation: 08/26/2021 ? ?Pre-op Diagnosis: Lung mass ? ?Post-op Diagnosis: Lung mass ? ?Surgeon: Garner Nash, DO  ? ?Assistants: None  ? ?Anesthesia: General endotracheal anesthesia ? ?Operation: Flexible video fiberoptic bronchoscopy with endobronchial ultrasound and biopsies. ? ?Estimated Blood Loss: Minimal ? ?Complications: None  ? ?Indications and History: ?[name] is a 66 y.o. male with lung mass.  The risks, benefits, complications, treatment options and expected outcomes were discussed with the patient.  The possibilities of pneumothorax, pneumonia, reaction to medication, pulmonary aspiration, perforation of a viscus, bleeding, failure to diagnose a condition and creating a complication requiring transfusion or operation were discussed with the patient who freely signed the consent.   ? ?Description of Procedure: ?The patient was examined in the preoperative area and history and data from the preprocedure consultation were reviewed. It was deemed appropriate to proceed.  The patient was taken to [facility] 3, identified as [name] and the procedure verified as Flexible Video Fiberoptic Bronchoscopy.  A Time Out was held and the above information confirmed. After being taken to the operating room general anesthesia was initiated and the patient  was orally intubated. The video fiberoptic bronchoscope was introduced via the endotracheal tube and a general inspection was performed which showed right lung with an accessory right upper lobe, the takeoff is just above and superior to the standard apical opening of the right upper lobe.  The left lung appears normal except for visible endobronchial tumor and tumor mucosal infiltration of the left upper lobe apical division and anterior takeoff.  The lingula appeared spared. The standard scope was then withdrawn and the endobronchial ultrasound was used to  identify and characterize the peritracheal, hilar and bronchial lymph nodes. Inspection showed large hilar and left lung mass. Using real-time ultrasound guidance Wang needle biopsies were take from hilar left lung mass and left upper lobe and were sent for cytology.  Following endobronchial ultrasound transbronchial needle aspirations we switched to standard therapeutic bronchoscope and obtained cytology brushings from the left upper lobe as well as transbronchial biopsies.  All tissue specimens sent to cytology.  We then used a therapeutic bronchoscope for aspiration of the bilateral mainstem's removal of any remaining blood clots and debris.  The there are no evidence of active bleeding at the termination of the procedure.  The patient tolerated the procedure well without apparent complications. There was no significant blood loss. The bronchoscope was withdrawn. Anesthesia was reversed and the patient was taken to the PACU for recovery.  ? ?Samples: ?1. Wang needle biopsies from left hilar mass, left upper lobe ?2.  Cytology brushings left upper lobe ?3.  Transbronchial biopsy left upper lobe. ? ?Plans:  ?The patient will be discharged from the PACU to home when recovered from anesthesia. We will review the cytology, pathology results with the patient when they become available. Outpatient followup will be with Dr. [name].  ? ?Garner Nash, DO ?[facility] ?08/26/2021 8:35 AM   ? ?

## 2021-08-26 NOTE — Anesthesia Procedure Notes (Signed)
Procedure Name: Intubation ?Date/Time: 08/26/2021 7:43 AM ?Performed by: Betha Loa, CRNA ?Pre-anesthesia Checklist: Patient identified, Emergency Drugs available, Suction available and Patient being monitored ?Patient Re-evaluated:Patient Re-evaluated prior to induction ?Oxygen Delivery Method: Circle System Utilized ?Preoxygenation: Pre-oxygenation with 100% oxygen ?Induction Type: IV induction ?Ventilation: Mask ventilation without difficulty ?Laryngoscope Size: Mac and 4 ?Grade View: Grade I ?Tube type: Oral ?Number of attempts: 1 ?Airway Equipment and Method: Stylet ?Placement Confirmation: ETT inserted through vocal cords under direct vision, positive ETCO2 and breath sounds checked- equal and bilateral ?Secured at: 24 cm ?Tube secured with: Tape ?Dental Injury: Teeth and Oropharynx as per pre-operative assessment  ?Comments: R) lower loose tooth remains intact after induction and intubation. ? ? ? ? ?

## 2021-08-26 NOTE — Anesthesia Postprocedure Evaluation (Signed)
Anesthesia Post Note ? ?Patient: [name] ? ?Procedure(s) Performed: VIDEO BRONCHOSCOPY WITH ENDOBRONCHIAL ULTRASOUND (Bilateral) ?BRONCHIAL NEEDLE ASPIRATION BIOPSIES ?BRONCHIAL BRUSHINGS ?BRONCHIAL BIOPSIES ? ?  ? ?Patient location during evaluation: PACU ?Anesthesia Type: General ?Level of consciousness: awake and alert ?Pain management: pain level controlled ?Vital Signs Assessment: post-procedure vital signs reviewed and stable ?Respiratory status: spontaneous breathing, nonlabored ventilation and respiratory function stable ?Cardiovascular status: blood pressure returned to baseline and stable ?Postop Assessment: no apparent nausea or vomiting ?Anesthetic complications: no ? ? ?No notable events documented. ? ?Last Vitals:  ?Vitals:  ? 08/26/21 0920 08/26/21 0935  ?BP: 93/63 98/65  ?Pulse: 78 76  ?Resp: 17 (!) 22  ?Temp:  36.9 ?C  ?SpO2: 93% 94%  ?  ?Last Pain:  ?Vitals:  ? 08/26/21 0935  ?TempSrc:   ?PainSc: 0-No pain  ? ? ?  ?  ?  ?  ?  ?  ? ?Lynda Rainwater ? ? ? ? ?

## 2021-08-26 NOTE — Interval H&P Note (Signed)
History and Physical Interval Note: ? ?08/26/2021 ?7:10 AM ? ?Tinsley Lomas Parrilla  has presented today for surgery, with the diagnosis of lung mass, adenopathy.  The various methods of treatment have been discussed with the patient and family. After consideration of risks, benefits and other options for treatment, the patient has consented to  Procedure(s) with comments: ?VIDEO BRONCHOSCOPY WITH ENDOBRONCHIAL ULTRASOUND (Bilateral) - will need Guardant 360CDX as a surgical intervention.  The patient's history has been reviewed, patient examined, no change in status, stable for surgery.  I have reviewed the patient's chart and labs.  Questions were answered to the patient's satisfaction.   ? ?Nuclear medicine imaging concerning for hypermetabolic adenopathy. Patient is agreeable to proceed with bronchoscopy. We appreciate the referral from Dr. [name].  ? ?We discussed the risks benefits and alternatives of the biopsy and bronchoscopy. The patient is agreeable to proceed.  ? ? ?Octavio Graves Geramy Lamorte ? ? ?

## 2021-08-26 NOTE — Discharge Instructions (Signed)
Flexible Bronchoscopy, Care After ?This sheet gives you information about how to care for yourself after your test. Your doctor may also give you more specific instructions. If you have problems or questions, contact your doctor. ?Follow these instructions at home: ?Eating and drinking ?Do not eat or drink anything (not even water) for 2 hours after your test, or until your numbing medicine (local anesthetic) wears off. ?When your numbness is gone and your cough and gag reflexes have come back, you may: ?Eat only soft foods. ?Slowly drink liquids. ?The day after the test, go back to your normal diet. ?Driving ?Do not drive for 24 hours if you were given a medicine to help you relax (sedative). ?Do not drive or use heavy machinery while taking prescription pain medicine. ?General instructions ? ?Take over-the-counter and prescription medicines only as told by your doctor. ?Return to your normal activities as told. Ask what activities are safe for you. ?Do not use any products that have nicotine or tobacco in them. This includes cigarettes and e-cigarettes. If you need help quitting, ask your doctor. ?Keep all follow-up visits as told by your doctor. This is important. It is very important if you had a tissue sample (biopsy) taken. ?Get help right away if: ?You have shortness of breath that gets worse. ?You get light-headed. ?You feel like you are going to pass out (faint). ?You have chest pain. ?You cough up: ?More than a little blood. ?More blood than before. ?Summary ?Do not eat or drink anything (not even water) for 2 hours after your test, or until your numbing medicine wears off. ?Do not use cigarettes. Do not use e-cigarettes. ?Get help right away if you have chest pain. ? ?This information is not intended to replace advice given to you by your health care provider. Make sure you discuss any questions you have with your health care provider. ?Document Released: 03/06/2009 Document Revised: 04/21/2017 Document  Reviewed: 05/27/2016 ?Elsevier Patient Education ? [place]. ? ?

## 2021-08-26 NOTE — Transfer of Care (Signed)
Immediate Anesthesia Transfer of Care Note ? ?Patient: [name] ? ?Procedure(s) Performed: VIDEO BRONCHOSCOPY WITH ENDOBRONCHIAL ULTRASOUND (Bilateral) ?BRONCHIAL NEEDLE ASPIRATION BIOPSIES ?BRONCHIAL BRUSHINGS ?BRONCHIAL BIOPSIES ? ?Patient Location: PACU ? ?Anesthesia Type:General ? ?Level of Consciousness: awake, alert , patient cooperative and responds to stimulation ? ?Airway & Oxygen Therapy: Patient Spontanous Breathing and Patient connected to face mask oxygen ? ?Post-op Assessment: Report given to RN and Post -op Vital signs reviewed and stable ? ?Post vital signs: Reviewed and stable ? ?Last Vitals:  ?Vitals Value Taken Time  ?BP 102/71 08/26/21 0850  ?Temp 36.9 ?C 08/26/21 0850  ?Pulse 77 08/26/21 0853  ?Resp 20 08/26/21 0853  ?SpO2 96 % 08/26/21 0853  ?Vitals shown include unvalidated device data. ? ?Last Pain:  ?Vitals:  ? 08/26/21 0850  ?TempSrc:   ?PainSc: 0-No pain  ?   ? ?  ? ?Complications: No notable events documented. ?

## 2021-08-27 ENCOUNTER — Encounter (HOSPITAL_COMMUNITY): Payer: Self-pay | Admitting: Pulmonary Disease

## 2021-08-27 ENCOUNTER — Encounter: Payer: Self-pay | Admitting: *Deleted

## 2021-08-27 ENCOUNTER — Ambulatory Visit: Payer: Medicare HMO | Admitting: Cardiology

## 2021-08-27 VITALS — BP 117/73 | HR 80 | Ht 73.0 in | Wt 262.0 lb

## 2021-08-27 DIAGNOSIS — I4892 Unspecified atrial flutter: Secondary | ICD-10-CM

## 2021-08-27 DIAGNOSIS — I10 Essential (primary) hypertension: Secondary | ICD-10-CM

## 2021-08-27 DIAGNOSIS — D6869 Other thrombophilia: Secondary | ICD-10-CM | POA: Diagnosis not present

## 2021-08-27 DIAGNOSIS — I5032 Chronic diastolic (congestive) heart failure: Secondary | ICD-10-CM | POA: Diagnosis not present

## 2021-08-27 DIAGNOSIS — R918 Other nonspecific abnormal finding of lung field: Secondary | ICD-10-CM

## 2021-08-27 LAB — CYTOLOGY - NON PAP

## 2021-08-27 NOTE — Patient Instructions (Addendum)
Follow-Up: °Follow up with Dr. Branch in 6 months ° °Any Other Special Instructions Will Be Listed Below (If Applicable). ° ° ° ° °If you need a refill on your cardiac medications before your next appointment, please call your pharmacy. ° °

## 2021-08-27 NOTE — Progress Notes (Signed)
Oncology Nurse Navigator Documentation ? ? ?  08/27/2021  ? 12:00 PM  ?Oncology Nurse Navigator Flowsheets  ?Abnormal Finding Date 07/09/2021  ?Confirmed Diagnosis Date 08/26/2021  ?Diagnosis Status Pathology Pending  ?Navigator Follow Up Date: 09/01/2021  ?Navigator Follow Up Reason: New Patient Appointment  ?Navigator Location [facility]  ?Referral Date to RadOnc/MedOnc 08/26/2021  ?Navigator Encounter Type Introductory Phone Call  ?Treatment Phase Pre-Tx/Tx Discussion  ?Barriers/Navigation Needs Coordination of Care;Education/I received referral on Mr. Nofsinger.  I called and scheduled him to be seen with Dr. Julien Nordmann next week with labs.  Patient verbalized understanding of appt.   ?Education Other  ?Interventions Coordination of Care;Education;Psycho-Social Support  ?Acuity Level 2-Minimal Needs (1-2 Barriers Identified)  ?Coordination of Care Appts  ?Education Method Verbal  ?Time Spent with Patient 30  ?  ?

## 2021-08-27 NOTE — Progress Notes (Signed)
? ? ? ?Clinical Summary ?[name] is a 66 y.o.male seen today for follow up of the following medical problems.  ? ?1.HFpEF ?- admit Jan 2023 with volume overload ?-Jan 2023 echo LVEF 60-65%, D shaped septum, mild RV dysfunction, PASP 47 ?- diuresed during Jan 2023 admission ? ?- no recurrent edema ?- weights at home 250-252 lbs and stable. His scale differs quite a bit from clinic scales ?- taking torsemide 40mg  daily. ?- no LE edema, no SOB/DOE  ? ?2. History of chest pain ?-2019 nuclear stress no clear ischemia ? ?3.Aflutter ?- new diagnosis Jan 2023 ?- 06/03/21 TEE/DCCV ?- no recent palpitations.  ?- no bleeding on eliquis, had to hold eliquis for bronchoscopy.  ? ? ?4.COPD ?-followed by Dr [name] ? ?5. HTN ?- compliant with meds ? ?6. Dilated aortic root ?- Jan 2023 echo 4.1 cm ? ?7. OSA screen ?- needs outpatient eval ?- wants to wait on sleep eval ? ?8. Lung cancer ?- recent bronch, have not gotten official results but already received a call to schedule appoitment with oncology. ? ? ?Past Medical History:  ?Diagnosis Date  ? Atrial flutter ([place])   ? s/p DCCV 06/03/21  ? CHF (congestive heart failure) ([place])   ? Colon cancer ([place]) 04/22/2013  ? Hypertension   ? Renal disorder   ? Kidney stones  ? ? ? ?Allergies  ?Allergen Reactions  ? Penicillins Other (See Comments)  ?  Childhood Allergy  ?Has patient had a PCN reaction causing immediate rash, facial/tongue/throat swelling, SOB or lightheadedness with hypotension: No ?Has patient had a PCN reaction causing severe rash involving mucus membranes or skin necrosis: No ?Has patient had a PCN reaction that required hospitalization: No ?Has patient had a PCN reaction occurring within the last 10 years: No ?If all of the above answers are "NO", then may proceed with Cephalosporin use. ?  ? ? ? ?Current Outpatient Medications  ?Medication Sig Dispense Refill  ? acetaminophen (TYLENOL) 325 MG tablet Take 2 tablets (650 mg total) by mouth every 6 (six) hours as needed  for mild pain (or Fever >/= 101). 12 tablet 0  ? albuterol (PROVENTIL) (2.5 MG/3ML) 0.083% nebulizer solution Take 3 mLs by nebulization every 6 (six) hours as needed for wheezing or shortness of breath. 75 mL 12  ? albuterol (VENTOLIN HFA) 108 (90 Base) MCG/ACT inhaler Inhale 2 puffs into the lungs every 6 (six) hours as needed for wheezing or shortness of breath. 18 g 2  ? apixaban (ELIQUIS) 5 MG TABS tablet Take 1 tablet (5 mg total) by mouth 2 (two) times daily. 60 tablet 2  ? atorvastatin (LIPITOR) 20 MG tablet Take 1 tablet (20 mg total) by mouth every evening. 30 tablet 3  ? bisoprolol (ZEBETA) 5 MG tablet Take 1 tablet (5 mg total) by mouth daily. For BP and Heart Rate Control 30 tablet 5  ? Budeson-Glycopyrrol-Formoterol (BREZTRI AEROSPHERE) 160-9-4.8 MCG/ACT AERO Inhale 2 puffs into the lungs daily. 10.7 g 3  ? Budeson-Glycopyrrol-Formoterol (BREZTRI AEROSPHERE) 160-9-4.8 MCG/ACT AERO Inhale 2 puffs into the lungs in the morning and at bedtime. 10.7 g 0  ? glimepiride (AMARYL) 2 MG tablet Take 1 tablet (2 mg total) by mouth every morning. 30 tablet 11  ? Multiple Vitamin (MULTI-VITAMIN DAILY PO) Take 1 tablet by mouth daily.    ? nicotine (NICODERM CQ - DOSED IN MG/24 HOURS) 21 mg/24hr patch Place 1 patch (21 mg total) onto the skin daily. 28 patch 0  ? potassium chloride (  KLOR-CON) 10 MEQ tablet Take 1 tablet (10 mEq total) by mouth daily. Take While taking Lasix/furosemide 30 tablet 2  ? torsemide 40 MG TABS Take 40 mg by mouth every morning. 30 tablet 2  ? ?No current facility-administered medications for this visit.  ? ? ? ?Past Surgical History:  ?Procedure Laterality Date  ? BRONCHIAL BIOPSY  08/26/2021  ? Procedure: BRONCHIAL BIOPSIES;  Surgeon: Garner Nash, DO;  Location: [facility];  Service: Pulmonary;;  ? BRONCHIAL BRUSHINGS  08/26/2021  ? Procedure: BRONCHIAL BRUSHINGS;  Surgeon: Garner Nash, DO;  Location: [facility];  Service: Pulmonary;;  ? BRONCHIAL NEEDLE ASPIRATION BIOPSY   08/26/2021  ? Procedure: BRONCHIAL NEEDLE ASPIRATION BIOPSIES;  Surgeon: Garner Nash, DO;  Location: [facility];  Service: Pulmonary;;  ? CARDIOVERSION N/A 06/03/2021  ? Procedure: CARDIOVERSION;  Surgeon: Satira Sark, MD;  [facility];  Service: Cardiovascular;  Laterality: N/A;  ? COLONOSCOPY N/A 04/29/2013  ? Procedure: COLONOSCOPY;  Surgeon: Danie Binder, MD;  [facility];  Service: Endoscopy;  Laterality: N/A;  10:30AM  ? Cyst removed from left elbow    ? ENDOVENOUS ABLATION SAPHENOUS VEIN W/ LASER Left 01/16/2019  ? endovenous laser ablation left greater saphenous vein by Ruta Hinds MD  ? KIDNEY STONE SURGERY    ? LITHOTRIPSY    ? PARTIAL COLECTOMY    ? TEE WITHOUT CARDIOVERSION N/A 06/03/2021  ? Procedure: TRANSESOPHAGEAL ECHOCARDIOGRAM (TEE);  Surgeon: Satira Sark, MD;  [facility]: [facility];  Service: Cardiovascular;  Laterality: N/A;  ? VIDEO BRONCHOSCOPY WITH ENDOBRONCHIAL ULTRASOUND Bilateral 08/26/2021  ? Procedure: VIDEO BRONCHOSCOPY WITH ENDOBRONCHIAL ULTRASOUND;  Surgeon: Garner Nash, DO;  Location: [facility];  Service: Pulmonary;  Laterality: Bilateral;  will need Guardant [id number]  ? ? ? ?Allergies  ?Allergen Reactions  ? Penicillins Other (See Comments)  ?  Childhood Allergy  ?Has patient had a PCN reaction causing immediate rash, facial/tongue/throat swelling, SOB or lightheadedness with hypotension: No ?Has patient had a PCN reaction causing severe rash involving mucus membranes or skin necrosis: No ?Has patient had a PCN reaction that required hospitalization: No ?Has patient had a PCN reaction occurring within the last 10 years: No ?If all of the above answers are "NO", then may proceed with Cephalosporin use. ?  ? ? ? ? ?Family History  ?Problem Relation Age of Onset  ? Cancer Mother   ?     pancreatic  ? Colon cancer Neg Hx   ? ? ? ?Social History ?Mr. Belote reports that he has been smoking cigarettes. He has a 20.00 pack-year smoking history. He has  never used smokeless tobacco. ?Mr. Sabel reports that he does not currently use alcohol after a past usage of about 12.0 standard drinks per week. ? ? ?Review of Systems ?CONSTITUTIONAL: No weight loss, fever, chills, weakness or fatigue.  ?HEENT: Eyes: No visual loss, blurred vision, double vision or yellow sclerae.No hearing loss, sneezing, congestion, runny nose or sore throat.  ?SKIN: No rash or itching.  ?CARDIOVASCULAR: per hpi ?RESPIRATORY: No shortness of breath, cough or sputum.  ?GASTROINTESTINAL: No anorexia, nausea, vomiting or diarrhea. No abdominal pain or blood.  ?GENITOURINARY: No burning on urination, no polyuria ?NEUROLOGICAL: No headache, dizziness, syncope, paralysis, ataxia, numbness or tingling in the extremities. No change in bowel or bladder control.  ?MUSCULOSKELETAL: No muscle, back pain, joint pain or stiffness.  ?LYMPHATICS: No enlarged nodes. No history of splenectomy.  ?PSYCHIATRIC: No history of depression or anxiety.  ?ENDOCRINOLOGIC:  No reports of sweating, cold or heat intolerance. No polyuria or polydipsia.  ?. ? ? ?Physical Examination ?Today's Vitals  ? 08/27/21 1534  ?BP: 117/73  ?Pulse: 80  ?SpO2: 96%  ?Weight: 262 lb (118.8 kg)  ?Height: 6\' 1"  (1.854 m)  ? ?Body mass index is 34.57 kg/m?. ? ?Gen: resting comfortably, no acute distress ?HEENT: no scleral icterus, pupils equal round and reactive, no palptable cervical adenopathy,  ?CV: RRR, no m/r/g no jvd ?Resp: Clear to auscultation bilaterally ?GI: abdomen is soft, non-tender, non-distended, normal bowel sounds, no hepatosplenomegaly ?MSK: extremities are warm, left leg nonpitting edema ?Skin: warm, no rash ?Neuro:  no focal deficits ?Psych: appropriate affect ? ? ?Diagnostic Studies ?Jan 2023 echo ? ? 1. Left ventricular ejection fraction, by estimation, is 60 to 65%. The  ?left ventricle has normal function. The left ventricle has no regional  ?wall motion abnormalities. There is mild left ventricular hypertrophy.  ?Left  ventricular diastolic parameters  ?are indeterminate. There is the interventricular septum is flattened in  ?systole and diastole, consistent with right ventricular pressure and  ?volume overload.  ? 2. Ri

## 2021-09-01 ENCOUNTER — Other Ambulatory Visit: Payer: Self-pay

## 2021-09-01 ENCOUNTER — Encounter: Payer: Self-pay | Admitting: *Deleted

## 2021-09-01 ENCOUNTER — Inpatient Hospital Stay: Payer: Medicare HMO

## 2021-09-01 ENCOUNTER — Inpatient Hospital Stay: Payer: Medicare HMO | Attending: Internal Medicine | Admitting: Internal Medicine

## 2021-09-01 DIAGNOSIS — C3412 Malignant neoplasm of upper lobe, left bronchus or lung: Secondary | ICD-10-CM | POA: Diagnosis not present

## 2021-09-01 DIAGNOSIS — Z79899 Other long term (current) drug therapy: Secondary | ICD-10-CM | POA: Insufficient documentation

## 2021-09-01 DIAGNOSIS — I509 Heart failure, unspecified: Secondary | ICD-10-CM | POA: Diagnosis not present

## 2021-09-01 DIAGNOSIS — C3490 Malignant neoplasm of unspecified part of unspecified bronchus or lung: Secondary | ICD-10-CM | POA: Diagnosis not present

## 2021-09-01 DIAGNOSIS — Z87442 Personal history of urinary calculi: Secondary | ICD-10-CM

## 2021-09-01 DIAGNOSIS — I4891 Unspecified atrial fibrillation: Secondary | ICD-10-CM

## 2021-09-01 DIAGNOSIS — Z85038 Personal history of other malignant neoplasm of large intestine: Secondary | ICD-10-CM

## 2021-09-01 DIAGNOSIS — I10 Essential (primary) hypertension: Secondary | ICD-10-CM

## 2021-09-01 DIAGNOSIS — R918 Other nonspecific abnormal finding of lung field: Secondary | ICD-10-CM

## 2021-09-01 DIAGNOSIS — C3492 Malignant neoplasm of unspecified part of left bronchus or lung: Secondary | ICD-10-CM | POA: Insufficient documentation

## 2021-09-01 DIAGNOSIS — C3432 Malignant neoplasm of lower lobe, left bronchus or lung: Secondary | ICD-10-CM

## 2021-09-01 DIAGNOSIS — F17210 Nicotine dependence, cigarettes, uncomplicated: Secondary | ICD-10-CM

## 2021-09-01 DIAGNOSIS — R69 Illness, unspecified: Secondary | ICD-10-CM | POA: Diagnosis not present

## 2021-09-01 DIAGNOSIS — Z5111 Encounter for antineoplastic chemotherapy: Secondary | ICD-10-CM | POA: Diagnosis not present

## 2021-09-01 DIAGNOSIS — Z808 Family history of malignant neoplasm of other organs or systems: Secondary | ICD-10-CM | POA: Diagnosis not present

## 2021-09-01 LAB — CBC WITH DIFFERENTIAL (CANCER CENTER ONLY)
Abs Immature Granulocytes: 0.06 10*3/uL (ref 0.00–0.07)
Basophils Absolute: 0.1 10*3/uL (ref 0.0–0.1)
Basophils Relative: 0 %
Eosinophils Absolute: 0.1 10*3/uL (ref 0.0–0.5)
Eosinophils Relative: 1 %
HCT: 39.2 % (ref 39.0–52.0)
Hemoglobin: 13.1 g/dL (ref 13.0–17.0)
Immature Granulocytes: 0 %
Lymphocytes Relative: 20 %
Lymphs Abs: 2.8 10*3/uL (ref 0.7–4.0)
MCH: 33.2 pg (ref 26.0–34.0)
MCHC: 33.4 g/dL (ref 30.0–36.0)
MCV: 99.2 fL (ref 80.0–100.0)
Monocytes Absolute: 1.4 10*3/uL — ABNORMAL HIGH (ref 0.1–1.0)
Monocytes Relative: 10 %
Neutro Abs: 9.5 10*3/uL — ABNORMAL HIGH (ref 1.7–7.7)
Neutrophils Relative %: 69 %
Platelet Count: 241 10*3/uL (ref 150–400)
RBC: 3.95 MIL/uL — ABNORMAL LOW (ref 4.22–5.81)
RDW: 14.6 % (ref 11.5–15.5)
WBC Count: 13.9 10*3/uL — ABNORMAL HIGH (ref 4.0–10.5)
nRBC: 0 % (ref 0.0–0.2)

## 2021-09-01 LAB — CMP (CANCER CENTER ONLY)
ALT: 23 U/L (ref 0–44)
AST: 18 U/L (ref 15–41)
Albumin: 3.9 g/dL (ref 3.5–5.0)
Alkaline Phosphatase: 101 U/L (ref 38–126)
Anion gap: 4 — ABNORMAL LOW (ref 5–15)
BUN: 15 mg/dL (ref 8–23)
CO2: 34 mmol/L — ABNORMAL HIGH (ref 22–32)
Calcium: 9.6 mg/dL (ref 8.9–10.3)
Chloride: 101 mmol/L (ref 98–111)
Creatinine: 1.04 mg/dL (ref 0.61–1.24)
GFR, Estimated: >60 mL/min (ref >60)
Glucose, Bld: 61 mg/dL — ABNORMAL LOW (ref 70–99)
Potassium: 4.3 mmol/L (ref 3.5–5.1)
Sodium: 139 mmol/L (ref 135–145)
Total Bilirubin: 0.6 mg/dL (ref 0.3–1.2)
Total Protein: 7.4 g/dL (ref 6.5–8.1)

## 2021-09-01 MED ORDER — PROCHLORPERAZINE MALEATE 10 MG PO TABS: 10.0000 mg | ORAL_TABLET | Freq: Four times a day (QID) | ORAL | 0 refills | Status: DC | PRN

## 2021-09-01 NOTE — Telephone Encounter (Signed)
Patient discussed results with Dr. [name] on 08/16/21.  ?

## 2021-09-01 NOTE — Progress Notes (Signed)
I checked to see if Mr. [name]'s mri brain is authorized with insurance. It is not so I reached out to Dr. Valeta Harms to see whom in his office can help get the scan authorized.  Wait for responds.  ?

## 2021-09-01 NOTE — Progress Notes (Signed)
? ? [facility] ?[phone]   [phone] ? ?CONSULT NOTE ? ?REFERRING PHYSICIAN: Dr. Leory Plowman Icard ? ?REASON FOR CONSULTATION:  ?66 years old white male recently diagnosed with lung cancer. ? ?HPI ?[name] is a 66 y.o. male with past medical history significant for hypertension, atrial fibrillation, history of colon cancer in December 2014, kidney stones as well as congestive heart failure.  The patient was followed by Dr. [name] for COPD.  He was complaining of increasing shortness of breath.  He had a chest x-ray on June 05, 2021 and it showed mild increase in airspace opacity at the left lung base.  Repeat chest x-ray on 07/08/2021 showed increasing left mid to lower lung zone masslike airspace opacity.  This was followed by CT scan of the chest without contrast on July 23, 2021 and it showed rounded masslike area in the left upper lobe measuring up to 5.6 cm and the appearance is concerning for lung cancer with associated left hilar adenopathy and borderline size mediastinal lymph nodes.  A PET scan was performed on August 12, 2021 and it showed hypermetabolic lingular mass with hypermetabolic ipsilateral mediastinal/hilar adenopathy consistent with the primary bronchogenic carcinoma.  There was also a right renal stones. ?On 08/26/2021 the patient underwent flexible video fiberoptic bronchoscopy with endobronchial ultrasound and biopsies under the care of Dr. Valeta Harms.  The final pathology ([id number]) showed malignant cells consistent with squamous cell carcinoma. ?Dr. Valeta Harms kindly referred the patient to me today for evaluation and recommendation regarding treatment of his condition. ?When seen today the patient is feeling fine with no concerning complaints except for mild cough.  He denied having any chest pain, shortness of breath or hemoptysis.  He has no nausea, vomiting, diarrhea or constipation.  He has no headache or visual changes.  He has no significant  weight loss or night sweats. ?Family history significant for mother with pancreatic cancer and died at age 12.  Father had lung cancer and died at age 60.  His sister also has brain cancer. ?The patient is married and has 2 stepchildren and several grandchildren.  He used to work in [employer].  He was accompanied today by [name].  He has a history of smoking around 1 pack/day for 50 years and unfortunately continues to smoke.  He also drinks couple of alcoholic drinks every day with no history of drug abuse. ? ?HPI ? ?Past Medical History:  ?Diagnosis Date  ? Atrial flutter ([place])   ? s/p DCCV 06/03/21  ? CHF (congestive heart failure) ([place])   ? Colon cancer ([place]) 04/22/2013  ? Hypertension   ? Renal disorder   ? Kidney stones  ? ? ?Past Surgical History:  ?Procedure Laterality Date  ? BRONCHIAL BIOPSY  08/26/2021  ? Procedure: BRONCHIAL BIOPSIES;  Surgeon: Garner Nash, DO;  Location: [facility];  Service: Pulmonary;;  ? BRONCHIAL BRUSHINGS  08/26/2021  ? Procedure: BRONCHIAL BRUSHINGS;  Surgeon: Garner Nash, DO;  Location: [facility];  Service: Pulmonary;;  ? BRONCHIAL NEEDLE ASPIRATION BIOPSY  08/26/2021  ? Procedure: BRONCHIAL NEEDLE ASPIRATION BIOPSIES;  Surgeon: Garner Nash, DO;  Location: [facility];  Service: Pulmonary;;  ? CARDIOVERSION N/A 06/03/2021  ? Procedure: CARDIOVERSION;  Surgeon: Satira Sark, MD;  [facility];  Service: Cardiovascular;  Laterality: N/A;  ? COLONOSCOPY N/A 04/29/2013  ? Procedure: COLONOSCOPY;  Surgeon: Danie Binder, MD;  [facility];  Service: Endoscopy;  Laterality: N/A;  10:30AM  ? Cyst removed from left elbow    ? ENDOVENOUS ABLATION SAPHENOUS VEIN W/ LASER Left 01/16/2019  ? endovenous laser ablation left greater saphenous vein by Ruta Hinds MD  ? KIDNEY STONE SURGERY    ? LITHOTRIPSY    ? PARTIAL COLECTOMY    ? TEE WITHOUT CARDIOVERSION N/A 06/03/2021  ? Procedure: TRANSESOPHAGEAL ECHOCARDIOGRAM (TEE);  Surgeon:  Satira Sark, MD;  [facility];  Service: Cardiovascular;  Laterality: N/A;  ? VIDEO BRONCHOSCOPY WITH ENDOBRONCHIAL ULTRASOUND Bilateral 08/26/2021  ? Procedure: VIDEO BRONCHOSCOPY WITH ENDOBRONCHIAL ULTRASOUND;  Surgeon: Garner Nash, DO;  Location: [facility];  Service: Pulmonary;  Laterality: Bilateral;  will need Guardant [id number]  ? ? ?Family History  ?Problem Relation Age of Onset  ? Cancer Mother   ?     pancreatic  ? Colon cancer Neg Hx   ? ? ?Social History ?Social History  ? ?Tobacco Use  ? Smoking status: Every Day  ?  Packs/day: 1.00  ?  Years: 20.00  ?  Pack years: 20.00  ?  Types: Cigarettes  ? Smokeless tobacco: Never  ? Tobacco comments:  ?  1 pack a day [facility] 08/24/21  ?Vaping Use  ? Vaping Use: Never used  ?Substance Use Topics  ? Alcohol use: Not Currently  ?  Alcohol/week: 12.0 standard drinks  ?  Types: 6 Cans of beer, 6 Shots of liquor per week  ?  Comment: couple mixed drinks at night as of 05/31/21  ? Drug use: No  ? ? ?Allergies  ?Allergen Reactions  ? Penicillins Other (See Comments)  ?  Childhood Allergy  ?Has patient had a PCN reaction causing immediate rash, facial/tongue/throat swelling, SOB or lightheadedness with hypotension: No ?Has patient had a PCN reaction causing severe rash involving mucus membranes or skin necrosis: No ?Has patient had a PCN reaction that required hospitalization: No ?Has patient had a PCN reaction occurring within the last 10 years: No ?If all of the above answers are "NO", then may proceed with Cephalosporin use. ?  ? ? ?Current Outpatient Medications  ?Medication Sig Dispense Refill  ? acetaminophen (TYLENOL) 325 MG tablet Take 2 tablets (650 mg total) by mouth every 6 (six) hours as needed for mild pain (or Fever >/= 101). 12 tablet 0  ? albuterol (PROVENTIL) (2.5 MG/3ML) 0.083% nebulizer solution Take 3 mLs by nebulization every 6 (six) hours as needed for wheezing or shortness of breath. 75 mL 12  ? albuterol (VENTOLIN HFA) 108 (90 Base) MCG/ACT  inhaler Inhale 2 puffs into the lungs every 6 (six) hours as needed for wheezing or shortness of breath. 18 g 2  ? apixaban (ELIQUIS) 5 MG TABS tablet Take 1 tablet (5 mg total) by mouth 2 (two) times daily. 60 tablet 2  ? atorvastatin (LIPITOR) 20 MG tablet Take 1 tablet (20 mg total) by mouth every evening. 30 tablet 3  ? bisoprolol (ZEBETA) 5 MG tablet Take 1 tablet (5 mg total) by mouth daily. For BP and Heart Rate Control 30 tablet 5  ? Budeson-Glycopyrrol-Formoterol (BREZTRI AEROSPHERE) 160-9-4.8 MCG/ACT AERO Inhale 2 puffs into the lungs in the morning and at bedtime. 10.7 g 0  ? glimepiride (AMARYL) 2 MG tablet Take 1 tablet (2 mg total) by mouth every morning. 30 tablet 11  ? Multiple Vitamin (MULTI-VITAMIN DAILY PO) Take 1 tablet by mouth daily.    ? potassium chloride (KLOR-CON) 10 MEQ tablet Take 1 tablet (10 mEq total) by mouth daily. Take While  taking Lasix/furosemide 30 tablet 2  ? torsemide 40 MG TABS Take 40 mg by mouth every morning. 30 tablet 2  ? ?No current facility-administered medications for this visit.  ? ? ?Review of Systems ? ?Constitutional: positive for fatigue ?Eyes: negative ?Ears, nose, mouth, throat, and face: negative ?Respiratory: positive for cough ?Cardiovascular: negative ?Gastrointestinal: negative ?Genitourinary:negative ?Integument/breast: negative ?Hematologic/lymphatic: negative ?Musculoskeletal:negative ?Neurological: negative ?Behavioral/Psych: negative ?Endocrine: negative ?Allergic/Immunologic: negative ? ?Physical Exam ? ?MGQ:QPYPP, healthy, no distress, well nourished, well developed, and anxious ?SKIN: skin color, texture, turgor are normal, no rashes or significant lesions ?HEAD: Normocephalic, No masses, lesions, tenderness or abnormalities ?EYES: normal, PERRLA, Conjunctiva are pink and non-injected ?EARS: External ears normal, Canals clear ?OROPHARYNX:no exudate, no erythema, and lips, buccal mucosa, and tongue normal  ?NECK: supple, no adenopathy, no JVD ?LYMPH:   no palpable lymphadenopathy, no hepatosplenomegaly ?LUNGS: expiratory wheezes bilaterally ?HEART: regular rate & rhythm, no murmurs, and no gallops ?ABDOMEN:abdomen soft, non-tender, obese, normal bowel sounds

## 2021-09-01 NOTE — Progress Notes (Signed)

## 2021-09-01 NOTE — Progress Notes (Signed)
Oncology Nurse Navigator Documentation ? ? ?  09/01/2021  ?  1:00 PM 08/27/2021  ? 12:00 PM  ?Oncology Nurse Navigator Flowsheets  ?Abnormal Finding Date  07/09/2021  ?Confirmed Diagnosis Date  08/26/2021  ?Diagnosis Status  Pathology Pending  ?Navigator Follow Up Date:  09/01/2021  ?Navigator Follow Up Reason:  New Patient Appointment  ?Navigator Location [facility]  ?Referral Date to RadOnc/MedOnc  08/26/2021  ?Navigator Encounter Type Molecular Studies Introductory Phone Call  ?Treatment Phase Pre-Tx/Tx Discussion Pre-Tx/Tx Discussion  ?Barriers/Navigation Needs Coordination of Care Coordination of Care;Education  ?Education  Other  ?Interventions Coordination of Care/I followed up on [name] molecular and PDL 1 with [employer].  These test are still pending and Dr. Julien Nordmann is aware.  Coordination of Care;Education;Psycho-Social Support  ?Acuity Level 2-Minimal Needs (1-2 Barriers Identified) Level 2-Minimal Needs (1-2 Barriers Identified)  ?Coordination of Care Pathology Appts  ?Education Method  Verbal  ?Time Spent with Patient 30 30  ?  ?

## 2021-09-01 NOTE — Patient Instructions (Addendum)
Thank you for choosing [facility] to provide your care.   ?Should you have questions after your visit to the [facility]), please contact this office at [phone] between 8:30 AM and 4:30 PM.  ?Voice mails left after 4:00 PM may not be returned until the following business day.  ?Calls received after 4:30 PM will be answered by an off-site Nurse Triage Line. ?   ?Prescription Refills:  Please have your pharmacy contact us directly for most prescription requests.  Contact the office directly for refills of narcotics (pain medications). Allow 48-72 hours for refills. ? ?Appointments: Please contact the [facility] scheduling department [phone] for questions regarding [facility] appointment scheduling.  Contact the schedulers with any scheduling changes so that your appointment can be rescheduled in a timely manner.  ? ?Central Scheduling for [facility] [phone] - Call to schedule procedures such as PET scans, CT scans, MRI, Ultrasound, etc. ? ?To afford each patient quality time with our providers, please arrive 30 minutes before your scheduled appointment time.  If you arrive late for your appointment, you may be asked to reschedule.  We strive to give you quality time with our providers, and arriving late affects you and other patients whose appointments are after yours. If you are a no show for multiple scheduled visits, you may be dismissed from the clinic at the providers discretion.   ?  ?Resources: ?[place] [employer] [phone] for additional information on assistance programs or assistance connecting with community support programs   ?[place]  [phone]: Information regarding food stamps, Medicaid, and utility assistance ?[employer] [place] [employer]'s shared-ride transportation service for eligible riders who have a disability that prevents them from riding the fixed route bus.   ?Medicare [place] [phone]  Helps people with Medicare understand their rights and benefits, navigate the Medicare system, and secure the quality healthcare they deserve ?[place] [phone] Assists patients locate various types of support and financial assistance ?Cancer Care: 1-800-813-HOPE 216-622-9113) Provides financial assistance, online support groups, medication/co-pay assistance.   ?Transportation Assistance for appointments at [facility]: [phone] ? ?Again, thank you for choosing [facility] for your care.    ?  ?Smoking Tobacco Information, Adult ?Smoking tobacco can be harmful to your health. Tobacco contains a poisonous (toxic), colorless chemical called nicotine. Nicotine is addictive. It changes the brain and can make it hard to stop smoking. Tobacco also has other toxic chemicals that can hurt your body and raise your risk of many cancers. ?How can smoking tobacco affect me? ?Smoking tobacco puts you at risk for: ?Cancer. Smoking is most commonly associated with lung cancer, but can also lead to cancer in other parts of the body. ?Chronic obstructive pulmonary disease (COPD). This is a long-term lung condition that makes it hard to breathe. It also gets worse over time. ?High blood pressure (hypertension), heart disease, stroke, or heart attack. ?Lung infections, such as pneumonia. ?Cataracts. This is when the lenses in the eyes become clouded. ?Digestive problems. This may include peptic ulcers, heartburn, and gastroesophageal reflux disease (GERD). ?Oral health problems, such as gum disease and tooth loss. ?Loss of taste and smell. ?Smoking can affect your appearance by causing: ?Wrinkles. ?Yellow or stained teeth, fingers, and fingernails. ?Smoking tobacco can also affect your social life, because: ?It may be challenging to find places to smoke when away from home. Many workplaces, [employer], hotels, and public places are tobacco-free. ?Smoking is expensive. This is due  to the cost of tobacco and the  long-term costs of treating health problems from smoking. ?Secondhand smoke may affect those around you. Secondhand smoke can cause lung cancer, breathing problems, and heart disease. Children of smokers have a higher risk for: ?Sudden infant death syndrome (SIDS). ?Ear infections. ?Lung infections. ?If you currently smoke tobacco, quitting now can help you: ?Lead a longer and healthier life. ?Look, smell, breathe, and feel better over time. ?Save money. ?Protect others from the harms of secondhand smoke. ?What actions can I take to prevent health problems? ?Quit smoking ? ?Do not start smoking. Quit if you already do. ?Make a plan to quit smoking and commit to it. Look for programs to help you and ask your health care provider for recommendations and ideas. ?Set a date and write down all the reasons you want to quit. ?Let your friends and family know you are quitting so they can help and support you. Consider finding friends who also want to quit. It can be easier to quit with someone else, so that you can support each other. ?Talk with your health care provider about using nicotine replacement medicines to help you quit, such as gum, lozenges, patches, sprays, or pills. ?Do not replace cigarette smoking with electronic cigarettes, which are commonly called e-cigarettes. The safety of e-cigarettes is not known, and some may contain harmful chemicals. ?If you try to quit but return to smoking, stay positive. It is common to slip up when you first quit, so take it one day at a time. ?Be prepared for cravings. When you feel the urge to smoke, chew gum or suck on hard candy. ?Lifestyle ?Stay busy and take care of your body. ?Drink enough fluid to keep your urine pale yellow. ?Get plenty of exercise and eat a healthy diet. This can help prevent weight gain after quitting. ?Monitor your eating habits. Quitting smoking can cause you to have a larger appetite than when you smoke. ?Find ways to relax. Go out with friends or  family to a movie or a restaurant where people do not smoke. ?Ask your health care provider about having regular tests (screenings) to check for cancer. This may include blood tests, imaging tests, and other tests. ?Find ways to manage your stress, such as meditation, yoga, or exercise. ?Where to find support ?To get support to quit smoking, consider: ?Asking your health care provider for more information and resources. ?Taking classes to learn more about quitting smoking. ?Looking for local organizations that offer resources about quitting smoking. ?Joining a support group for people who want to quit smoking in your local community. ?Calling the smokefree.gov counselor helpline: 1-800-Quit-Now 330-797-4470) ?Where to find more information ?You may find more information about quitting smoking from: ?HelpGuide.org: www.helpguide.org ?https://hall.com/: smokefree.gov ?American Lung Association: www.lung.org ?Contact a health care provider if you: ?Have problems breathing. ?Notice that your lips, nose, or fingers turn blue. ?Have chest pain. ?Are coughing up blood. ?Feel faint or you pass out. ?Have other health changes that cause you to worry. ?Summary ?Smoking tobacco can negatively affect your health, the health of those around you, your finances, and your social life. ?Do not start smoking. Quit if you already do. If you need help quitting, ask your health care provider. ?Think about joining a support group for people who want to quit smoking in your local community. There are many effective programs that will help you to quit this behavior. ?This information is not intended to replace advice given to you by your health care  provider. Make sure you discuss any questions you have with your health care provider. ?Document Revised: 01/11/2021 Document Reviewed: 03/31/2020 ?Elsevier Patient Education ? [place]. ?External Beam Radiation Therapy, Care After ?This sheet gives you information about how to care for  yourself after your procedure. Your health care provider may also give you more specific instructions. If you have problems or questions, contact your health care provider. ?What can I expect after t

## 2021-09-02 ENCOUNTER — Encounter: Payer: Self-pay | Admitting: *Deleted

## 2021-09-02 ENCOUNTER — Other Ambulatory Visit: Payer: Self-pay | Admitting: *Deleted

## 2021-09-02 NOTE — Progress Notes (Signed)
Oncology Nurse Navigator Documentation ? ? ?  09/02/2021  ? 11:00 AM 09/01/2021  ?  1:00 PM 08/27/2021  ? 12:00 PM  ?Oncology Nurse Navigator Flowsheets  ?Abnormal Finding Date   07/09/2021  ?Confirmed Diagnosis Date   08/26/2021  ?Diagnosis Status   Pathology Pending  ?Navigator Follow Up Date: 09/06/2021  09/01/2021  ?Navigator Follow Up Reason: Appointment Review  New Patient Appointment  ?Navigator [facility]  ?Referral Date to RadOnc/MedOnc   08/26/2021  ?Navigator Encounter Type Other: Molecular Studies Introductory Phone Call  ?Treatment Phase Pre-Tx/Tx Discussion Pre-Tx/Tx Discussion Pre-Tx/Tx Discussion  ?Barriers/Navigation Needs Coordination of Care Coordination of Care Coordination of Care;Education  ?Education   Other  ?Interventions Coordination of Care/patients case was discussed today in cancer conference. See TB flow sheet for documentation of recommendations.  Coordination of Care Coordination of Care;Education;Psycho-Social Support  ?Acuity Level 2-Minimal Needs (1-2 Barriers Identified) Level 2-Minimal Needs (1-2 Barriers Identified) Level 2-Minimal Needs (1-2 Barriers Identified)  ?Coordination of Care Other Pathology Appts  ?Education Method   Verbal  ?Time Spent with Patient 15 30 30   ?  ?

## 2021-09-02 NOTE — Progress Notes (Signed)
The proposed treatment discussed in cancer conference is for discussion purpose only and is not a binding recommendation. The patient was not physically examined nor present for their treatment options. Therefore, final treatment plans cannot be decided.  ?

## 2021-09-03 ENCOUNTER — Ambulatory Visit: Payer: Medicare HMO | Admitting: Acute Care

## 2021-09-03 ENCOUNTER — Encounter: Payer: Self-pay | Admitting: *Deleted

## 2021-09-03 DIAGNOSIS — C3490 Malignant neoplasm of unspecified part of unspecified bronchus or lung: Secondary | ICD-10-CM

## 2021-09-03 NOTE — Progress Notes (Signed)
Oncology Nurse Navigator Documentation ? ? ?  09/03/2021  ? 11:00 AM 09/02/2021  ? 11:00 AM 09/01/2021  ?  1:00 PM 08/27/2021  ? 12:00 PM  ?Oncology Nurse Navigator Flowsheets  ?Abnormal Finding Date    07/09/2021  ?Confirmed Diagnosis Date    08/26/2021  ?Diagnosis Status    Pathology Pending  ?Navigator Follow Up Date: 09/06/2021 09/06/2021  09/01/2021  ?Navigator Follow Up Reason: Radiology Appointment Review  New Patient Appointment  ?Navigator Location [facility]  ?Referral Date to RadOnc/MedOnc    08/26/2021  ?Navigator Encounter Type Appt/Treatment Plan Review Other: Molecular Studies Introductory Phone Call  ?Treatment Initiated Date 09/13/2021     ?Patient Visit Type Other     ?Treatment Phase Pre-Tx/Tx Discussion Pre-Tx/Tx Discussion Pre-Tx/Tx Discussion Pre-Tx/Tx Discussion  ?Barriers/Navigation Needs Coordination of Care/Due to unable to get help with mri scan insurance authorization at the pulmonary office, Dr. Julien Nordmann re-ordered. Will check the auth status at a later time.  Coordination of Care Coordination of Care Coordination of Care;Education  ?Education    Other  ?Interventions Coordination of Care Coordination of Care Coordination of Care Coordination of Care;Education;Psycho-Social Support  ?Acuity Level 2-Minimal Needs (1-2 Barriers Identified) Level 2-Minimal Needs (1-2 Barriers Identified) Level 2-Minimal Needs (1-2 Barriers Identified) Level 2-Minimal Needs (1-2 Barriers Identified)  ?Coordination of Care Radiology Other Pathology Appts  ?Education Method    Verbal  ?Time Spent with Patient 30 15 30 30   ?  ?

## 2021-09-06 ENCOUNTER — Encounter: Payer: Self-pay | Admitting: *Deleted

## 2021-09-06 NOTE — Progress Notes (Addendum)
?Radiation Oncology         [phone]) [phone] ?________________________________ ? ?Name: [name]        MRN: [id number]  ?Date of Service: 09/07/2021 DOB: January 10, 1956 ? ?DX:IPJA, Carlean Jews, PA-C  Icard, Octavio Graves, DO    ? ?REFERRING PHYSICIAN: Garner Nash, DO ? ? ?DIAGNOSIS: The encounter diagnosis was Stage III squamous cell carcinoma of left lung ([place]). ? ? ?HISTORY OF PRESENT ILLNESS: [name] is a 66 y.o. male seen at the request of Dr. Valeta Harms with a newly diagnosed lung cancer. The patient was seen due to SOB and followed with Dr. [name]. Opacity was seen in the left lung and CT chest on 07/23/21 showed a mass in the LUL measuring up to 5.6 cm with borderline mediastinal adenopathy. A PET scan on 08/12/21 showed a 5.8 cm hypermetabolic lingular mass with an SUV in the 26 range, and hypermetabolic left hilar and medistinal nodes. No metastatic disease was noted. Bronchoscopy on 08/26/21 confirmed a squamous cell carcinoma. MRI Brain is pending, and he's met with Dr. Julien Nordmann to discuss chemoRT. He's scheduled to receive his first infusion of chemotherapy on 09/13/21.  ? ? ? ?PREVIOUS RADIATION THERAPY: No ? ? ?PAST MEDICAL HISTORY:  ?Past Medical History:  ?Diagnosis Date  ? Atrial flutter ([place])   ? s/p DCCV 06/03/21  ? CHF (congestive heart failure) ([place])   ? Colon cancer ([place]) 04/22/2013  ? Hypertension   ? Lung cancer ([place]) 08/26/2021  ? Renal disorder   ? Kidney stones  ?   ? ? ?PAST SURGICAL HISTORY: ?Past Surgical History:  ?Procedure Laterality Date  ? BRONCHIAL BIOPSY  08/26/2021  ? Procedure: BRONCHIAL BIOPSIES;  Surgeon: Garner Nash, DO;  Location: [facility];  Service: Pulmonary;;  ? BRONCHIAL BRUSHINGS  08/26/2021  ? Procedure: BRONCHIAL BRUSHINGS;  Surgeon: Garner Nash, DO;  Location: [facility];  Service: Pulmonary;;  ? BRONCHIAL NEEDLE ASPIRATION BIOPSY  08/26/2021  ? Procedure: BRONCHIAL NEEDLE ASPIRATION BIOPSIES;  Surgeon: Garner Nash, DO;  Location: [facility];   Service: Pulmonary;;  ? CARDIOVERSION N/A 06/03/2021  ? Procedure: CARDIOVERSION;  Surgeon: Satira Sark, MD;  Location: [facility];  Service: Cardiovascular;  Laterality: N/A;  ? COLONOSCOPY N/A 04/29/2013  ? Procedure: COLONOSCOPY;  Surgeon: Danie Binder, MD;  [facility];  Service: Endoscopy;  Laterality: N/A;  10:30AM  ? Cyst removed from left elbow    ? ENDOVENOUS ABLATION SAPHENOUS VEIN W/ LASER Left 01/16/2019  ? endovenous laser ablation left greater saphenous vein by Ruta Hinds MD  ? KIDNEY STONE SURGERY    ? LITHOTRIPSY    ? PARTIAL COLECTOMY    ? TEE WITHOUT CARDIOVERSION N/A 06/03/2021  ? Procedure: TRANSESOPHAGEAL ECHOCARDIOGRAM (TEE);  Surgeon: Satira Sark, MD;  Location: [facility];  Service: Cardiovascular;  Laterality: N/A;  ? VIDEO BRONCHOSCOPY WITH ENDOBRONCHIAL ULTRASOUND Bilateral 08/26/2021  ? Procedure: VIDEO BRONCHOSCOPY WITH ENDOBRONCHIAL ULTRASOUND;  Surgeon: Garner Nash, DO;  Location: [facility];  Service: Pulmonary;  Laterality: Bilateral;  will need Guardant [id number]  ? ? ? ?FAMILY HISTORY:  ?Family History  ?Problem Relation Age of Onset  ? Cancer Mother   ?     pancreatic  ? Colon cancer Neg Hx   ? ? ? ?SOCIAL HISTORY:  reports that he has been smoking cigarettes. He has a 20.00 pack-year smoking history. He has never used smokeless tobacco. He reports current alcohol use of about 12.0 standard drinks per week. He  reports that he does not use drugs. The patient is married and lives in [place].  ? ? ?ALLERGIES: Penicillins ? ? ?MEDICATIONS:  ?Current Outpatient Medications  ?Medication Sig Dispense Refill  ? acetaminophen (TYLENOL) 325 MG tablet Take 2 tablets (650 mg total) by mouth every 6 (six) hours as needed for mild pain (or Fever >/= 101). 12 tablet 0  ? apixaban (ELIQUIS) 5 MG TABS tablet Take 1 tablet (5 mg total) by mouth 2 (two) times daily. 60 tablet 2  ? atorvastatin (LIPITOR) 20 MG tablet Take 1 tablet (20 mg total) by mouth every evening. 30  tablet 3  ? bisoprolol (ZEBETA) 5 MG tablet Take 1 tablet (5 mg total) by mouth daily. For BP and Heart Rate Control 30 tablet 5  ? Budeson-Glycopyrrol-Formoterol (BREZTRI AEROSPHERE) 160-9-4.8 MCG/ACT AERO Inhale 2 puffs into the lungs in the morning and at bedtime. 10.7 g 0  ? glimepiride (AMARYL) 2 MG tablet Take 1 tablet (2 mg total) by mouth every morning. 30 tablet 11  ? Multiple Vitamin (MULTI-VITAMIN DAILY PO) Take 1 tablet by mouth daily.    ? potassium chloride (KLOR-CON) 10 MEQ tablet Take 1 tablet (10 mEq total) by mouth daily. Take While taking Lasix/furosemide 30 tablet 2  ? prochlorperazine (COMPAZINE) 10 MG tablet Take 1 tablet (10 mg total) by mouth every 6 (six) hours as needed for nausea or vomiting. 30 tablet 0  ? torsemide 40 MG TABS Take 40 mg by mouth every morning. 30 tablet 2  ? albuterol (PROVENTIL) (2.5 MG/3ML) 0.083% nebulizer solution Take 3 mLs by nebulization every 6 (six) hours as needed for wheezing or shortness of breath. (Patient not taking: Reported on 09/01/2021) 75 mL 12  ? albuterol (VENTOLIN HFA) 108 (90 Base) MCG/ACT inhaler Inhale 2 puffs into the lungs every 6 (six) hours as needed for wheezing or shortness of breath. (Patient not taking: Reported on 09/01/2021) 18 g 2  ? ?No current facility-administered medications for this encounter.  ? ? ? ?REVIEW OF SYSTEMS: On review of systems, the patient reports that he is doing okay overall. He's been feeling lost in the process of his diagnosis but up to the challenge of proceeding. He has shortness of breath with exertion as well as with rest and only uses oxygen at 2L [place] at home at night time. However other pulmonary notes indicate it's to be used continuously. He has a nonproductive cough and especially notes this at night time. He had a few days of hemoptysis after his biopsy but no additional episodes or any predating his biopsy. He denies any fevers, chills, night sweats, unintended weight changes. No complaints of pain or  headache are noted.  ? ?  ? ?PHYSICAL EXAM:  ?Wt Readings from Last 3 Encounters:  ?09/07/21 261 lb 6 oz (118.6 kg)  ?09/01/21 262 lb (118.8 kg)  ?08/27/21 262 lb (118.8 kg)  ? ?Temp Readings from Last 3 Encounters:  ?09/07/21 (!) 96.8 ?F (36 ?C) (Temporal)  ?09/01/21 97.9 ?F (36.6 ?C) (Tympanic)  ?08/26/21 98.4 ?F (36.9 ?C)  ? ?BP Readings from Last 3 Encounters:  ?09/07/21 136/61  ?09/01/21 131/69  ?08/27/21 117/73  ? ?Pulse Readings from Last 3 Encounters:  ?09/07/21 (!) 35  ?09/01/21 92  ?08/27/21 80  ? ?Pain Assessment ?Pain Score: 0-No pain/10 ? ?In general this is a chronically ill appearing caucasian male in no acute distress. He's alert and oriented x4 and appropriate throughout the examination. Cardiopulmonary assessment is negative for acute distress and he  exhibits normal effort.  ? ? ? ?ECOG = 1 ? ?0 - Asymptomatic (Fully active, able to carry on all predisease activities without restriction) ? ?1 - Symptomatic but completely ambulatory (Restricted in physically strenuous activity but ambulatory and able to carry out work of a light or sedentary nature. For example, light housework, office work) ? ?2 - Symptomatic, <50% in bed during the day (Ambulatory and capable of all self care but unable to carry out any work activities. Up and about more than 50% of waking hours) ? ?3 - Symptomatic, >50% in bed, but not bedbound (Capable of only limited self-care, confined to bed or chair 50% or more of waking hours) ? ?4 - Bedbound (Completely disabled. Cannot carry on any self-care. Totally confined to bed or chair) ? ?5 - Death ? ? Oken MM, Creech RH, Tormey DC, et al. [phone]). "Toxicity and response criteria of the [facility] Group". [place] Oncol. 5 (6): 649-55 ? ? ? ?LABORATORY DATA:  ?Lab Results  ?Component Value Date  ? WBC 13.9 (H) 09/01/2021  ? HGB 13.1 09/01/2021  ? HCT 39.2 09/01/2021  ? MCV 99.2 09/01/2021  ? PLT 241 09/01/2021  ? ?Lab Results  ?Component Value Date  ? NA 139  09/01/2021  ? K 4.3 09/01/2021  ? CL 101 09/01/2021  ? CO2 34 (H) 09/01/2021  ? ?Lab Results  ?Component Value Date  ? ALT 23 09/01/2021  ? AST 18 09/01/2021  ? ALKPHOS 101 09/01/2021  ? BILITOT 0.6 09/01/2021  ? ?

## 2021-09-06 NOTE — Progress Notes (Signed)
Thoracic Location of Tumor / Histology: Left Lung  ? ?Patient is followed by Dr. [name] for COPD.  He was complaining of increasing SOB.  He was sent for chest xray that showed increasing left mid to lower lung zone mass-like airspace opacity.  This was followed by CT Chest. ? ?MRI Brain 09/22/2021: ? ?Bronchoscopy 08/26/2021: ? ?PET 08/12/2021: Intensely hypermetabolic left hilar adenopathy. ?Measurement on CT is challenging without IV contrast.  Hypermetabolic ipsilateral mediastinal lymph nodes measure up to 11 mm in the prevascular space (3/87).  Intensely hypermetabolic lingular mass measures 4.9 x 5.8 cm. ? ?CT Chest 07/23/2021: Rounded mass-like area in the left upper lobe measures 5.6 x 4.1 cm.  This appears similar low density concerning for necrotic mass or less likely abscess.  Surrounding ground-glass opacities and interstitial prominence. Borderline sized mediastinal lymph nodes.  AP window lymph node measures 12 mm in short axis diameter.  Pre-vascular lymph nodes have a short axis diameter of 9 mm. Similarly sized right paratracheal lymph node. Fullness in the left hilum likely reflects adenopathy, difficult to separate from the pulmonary vessels on this noncontrast study, but probable left hilar lymph node measures up to 1.8 cm ? ? ? ?Biopsies of LUL Lung 08/26/2021 ? ? ? ?Tobacco/Marijuana/Snuff/ETOH use: Current Smoker ? ? ?Past/Anticipated interventions by cardiothoracic surgery, if any:  ? ? ?Past/Anticipated interventions by medical oncology, if any:  ?Dr. Julien Nordmann 09/01/2021 ?-I discussed with the patient his treatment options and I recommended for him a course of concurrent chemoradiation with weekly carboplatin for AUC of 2 and paclitaxel 45 Mg/M2.  This will be followed by consolidation treatment with immunotherapy if the patient has no evidence for disease progression after the induction phase. ?-The patient is expected to see Dr. Lisbeth Renshaw on 09/07/2021 for discussion of his radiotherapy option. ?-He is  expected to start the first cycle of the concurrent chemoradiation on September 13, 2021. ? ? ?Signs/Symptoms ?Weight changes, if any: No ?Respiratory complaints, if any: Notes some SOB even at rest. ?Hemoptysis, if any: Notes occasional non-productive cough, especially at night when he lays on his left side. ?Pain issues, if any: No  ? ?SAFETY ISSUES: ?Prior radiation? No ?Pacemaker/ICD? No  ?Possible current pregnancy? N/a ?Is the patient on methotrexate? No ? ?Current Complaints / other details:   ?History of Colon Cancer December 2014- Sigmoid Colectomy 2015 ? ?Uses Oxygen at bedtime- 2Liters  ?

## 2021-09-06 NOTE — Progress Notes (Signed)
Pharmacist Chemotherapy Monitoring - Initial Assessment   ? ?Anticipated start date: 09/13/2021  ? ?The following has been reviewed per standard work regarding the patient's treatment regimen: ?The patient's diagnosis, treatment plan and drug doses, and organ/hematologic function ?Lab orders and baseline tests specific to treatment regimen  ?The treatment plan start date, drug sequencing, and pre-medications ?Prior authorization status  ?Patient's documented medication list, including drug-drug interaction screen and prescriptions for anti-emetics and supportive care specific to the treatment regimen ?The drug concentrations, fluid compatibility, administration routes, and timing of the medications to be used ?The patient's access for treatment and lifetime cumulative dose history, if applicable  ?The patient's medication allergies and previous infusion related reactions, if applicable  ? ?Changes made to treatment plan:  ?N/A ? ?Follow up needed:  ?N/A ? ? ?Larene Beach, RPH, ?09/06/2021  11:15 AM ? ?

## 2021-09-07 ENCOUNTER — Other Ambulatory Visit: Payer: Self-pay

## 2021-09-07 ENCOUNTER — Ambulatory Visit
Admission: RE | Admit: 2021-09-07 | Discharge: 2021-09-07 | Disposition: A | Discharge status: Home / Self Care | Payer: Medicare HMO | Source: Ambulatory Visit | Attending: Radiation Oncology | Admitting: Radiation Oncology

## 2021-09-07 ENCOUNTER — Encounter: Payer: Self-pay | Admitting: Radiation Oncology

## 2021-09-07 ENCOUNTER — Telehealth: Payer: Self-pay | Admitting: Internal Medicine

## 2021-09-07 VITALS — BP 136/61 | HR 35 | Temp 96.8°F | Resp 20 | Ht 73.0 in | Wt 261.4 lb

## 2021-09-07 DIAGNOSIS — Z801 Family history of malignant neoplasm of trachea, bronchus and lung: Secondary | ICD-10-CM | POA: Insufficient documentation

## 2021-09-07 DIAGNOSIS — N200 Calculus of kidney: Secondary | ICD-10-CM | POA: Diagnosis not present

## 2021-09-07 DIAGNOSIS — I700 Atherosclerosis of aorta: Secondary | ICD-10-CM | POA: Diagnosis not present

## 2021-09-07 DIAGNOSIS — C3412 Malignant neoplasm of upper lobe, left bronchus or lung: Secondary | ICD-10-CM | POA: Insufficient documentation

## 2021-09-07 DIAGNOSIS — J432 Centrilobular emphysema: Secondary | ICD-10-CM | POA: Insufficient documentation

## 2021-09-07 DIAGNOSIS — I2510 Atherosclerotic heart disease of native coronary artery without angina pectoris: Secondary | ICD-10-CM | POA: Diagnosis not present

## 2021-09-07 DIAGNOSIS — I509 Heart failure, unspecified: Secondary | ICD-10-CM | POA: Insufficient documentation

## 2021-09-07 DIAGNOSIS — Z7984 Long term (current) use of oral hypoglycemic drugs: Secondary | ICD-10-CM | POA: Insufficient documentation

## 2021-09-07 DIAGNOSIS — F17210 Nicotine dependence, cigarettes, uncomplicated: Secondary | ICD-10-CM | POA: Insufficient documentation

## 2021-09-07 DIAGNOSIS — I10 Essential (primary) hypertension: Secondary | ICD-10-CM | POA: Insufficient documentation

## 2021-09-07 DIAGNOSIS — Z87442 Personal history of urinary calculi: Secondary | ICD-10-CM | POA: Diagnosis not present

## 2021-09-07 DIAGNOSIS — Z7901 Long term (current) use of anticoagulants: Secondary | ICD-10-CM | POA: Insufficient documentation

## 2021-09-07 DIAGNOSIS — Z800 Family history of malignant neoplasm of digestive organs: Secondary | ICD-10-CM | POA: Insufficient documentation

## 2021-09-07 DIAGNOSIS — I4891 Unspecified atrial fibrillation: Secondary | ICD-10-CM | POA: Insufficient documentation

## 2021-09-07 DIAGNOSIS — Z79899 Other long term (current) drug therapy: Secondary | ICD-10-CM | POA: Insufficient documentation

## 2021-09-07 DIAGNOSIS — C3492 Malignant neoplasm of unspecified part of left bronchus or lung: Secondary | ICD-10-CM

## 2021-09-07 DIAGNOSIS — R69 Illness, unspecified: Secondary | ICD-10-CM | POA: Diagnosis not present

## 2021-09-07 NOTE — Telephone Encounter (Signed)
Scheduled per 04/12 los, patient has been called and notified. ?

## 2021-09-08 ENCOUNTER — Inpatient Hospital Stay: Payer: Medicare HMO

## 2021-09-08 ENCOUNTER — Ambulatory Visit
Admission: RE | Admit: 2021-09-08 | Discharge: 2021-09-08 | Disposition: A | Discharge status: Home / Self Care | Payer: Medicare HMO | Source: Ambulatory Visit | Attending: Radiation Oncology | Admitting: Radiation Oncology

## 2021-09-08 DIAGNOSIS — J9601 Acute respiratory failure with hypoxia: Secondary | ICD-10-CM | POA: Diagnosis not present

## 2021-09-08 DIAGNOSIS — C3412 Malignant neoplasm of upper lobe, left bronchus or lung: Secondary | ICD-10-CM | POA: Diagnosis not present

## 2021-09-08 DIAGNOSIS — I5022 Chronic systolic (congestive) heart failure: Secondary | ICD-10-CM | POA: Diagnosis not present

## 2021-09-08 DIAGNOSIS — Z510 Encounter for antineoplastic radiation therapy: Secondary | ICD-10-CM | POA: Diagnosis not present

## 2021-09-08 DIAGNOSIS — R69 Illness, unspecified: Secondary | ICD-10-CM | POA: Diagnosis not present

## 2021-09-09 ENCOUNTER — Encounter: Payer: Self-pay | Admitting: *Deleted

## 2021-09-09 NOTE — Progress Notes (Signed)
Oncology Nurse Navigator Documentation ? ? ?  09/09/2021  ?  3:00 PM 09/03/2021  ? 11:00 AM 09/02/2021  ? 11:00 AM 09/01/2021  ?  1:00 PM 08/27/2021  ? 12:00 PM  ?Oncology Nurse Navigator Flowsheets  ?Abnormal Finding Date     07/09/2021  ?Confirmed Diagnosis Date     08/26/2021  ?Diagnosis Status     Pathology Pending  ?Planned Course of Treatment Chemo/Radiation Concurrent      ?Phase of Treatment Radiation      ?Chemotherapy Actual Start Date: 09/13/2021      ?Radiation Actual Start Date: 09/13/2021      ?Navigator Follow Up Date: 09/20/2021 09/06/2021 09/06/2021  09/01/2021  ?Navigator Follow Up Reason: Follow-up Appointment Radiology Appointment Review  New Patient Appointment  ?Navigator Location [facility]  ?Referral Date to RadOnc/MedOnc     08/26/2021  ?Navigator Encounter Type Appt/Treatment Plan Review Appt/Treatment Plan Review Other: Molecular Studies Introductory Phone Call  ?Treatment Initiated Date  09/13/2021     ?Patient Visit Type  Other     ?Treatment Phase Pre-Tx/Tx Discussion Pre-Tx/Tx Discussion Pre-Tx/Tx Discussion Pre-Tx/Tx Discussion Pre-Tx/Tx Discussion  ?Barriers/Navigation Needs Coordination of Care/I followed up on patient's tx schedule. He is set for his plan of care at this time.  Coordination of Care Coordination of Care Coordination of Care Coordination of Care;Education  ?Education     Other  ?Interventions Coordination of Care Coordination of Care Coordination of Care Coordination of Care Coordination of Care;Education;Psycho-Social Support  ?Acuity Level 2-Minimal Needs (1-2 Barriers Identified) Level 2-Minimal Needs (1-2 Barriers Identified) Level 2-Minimal Needs (1-2 Barriers Identified) Level 2-Minimal Needs (1-2 Barriers Identified) Level 2-Minimal Needs (1-2 Barriers Identified)  ?Coordination of Care Other Radiology Other Pathology Appts  ?Education Method     Verbal  ?Time Spent with Patient 30 30 15 30 30   ?  ?

## 2021-09-13 ENCOUNTER — Other Ambulatory Visit: Payer: Self-pay

## 2021-09-13 ENCOUNTER — Inpatient Hospital Stay: Payer: Medicare HMO

## 2021-09-13 ENCOUNTER — Inpatient Hospital Stay (HOSPITAL_BASED_OUTPATIENT_CLINIC_OR_DEPARTMENT_OTHER): Payer: Medicare HMO

## 2021-09-13 ENCOUNTER — Ambulatory Visit
Admission: RE | Admit: 2021-09-13 | Discharge: 2021-09-13 | Disposition: A | Discharge status: Home / Self Care | Payer: Medicare HMO | Source: Ambulatory Visit | Attending: Radiation Oncology | Admitting: Radiation Oncology

## 2021-09-13 VITALS — BP 110/79 | HR 87 | Temp 98.3°F | Resp 20 | Wt 262.8 lb

## 2021-09-13 DIAGNOSIS — C3492 Malignant neoplasm of unspecified part of left bronchus or lung: Secondary | ICD-10-CM

## 2021-09-13 DIAGNOSIS — Z79899 Other long term (current) drug therapy: Secondary | ICD-10-CM | POA: Diagnosis not present

## 2021-09-13 DIAGNOSIS — C3412 Malignant neoplasm of upper lobe, left bronchus or lung: Secondary | ICD-10-CM | POA: Diagnosis not present

## 2021-09-13 DIAGNOSIS — R69 Illness, unspecified: Secondary | ICD-10-CM | POA: Diagnosis not present

## 2021-09-13 DIAGNOSIS — Z510 Encounter for antineoplastic radiation therapy: Secondary | ICD-10-CM | POA: Diagnosis not present

## 2021-09-13 DIAGNOSIS — Z5111 Encounter for antineoplastic chemotherapy: Secondary | ICD-10-CM | POA: Diagnosis not present

## 2021-09-13 LAB — CMP (CANCER CENTER ONLY)
ALT: 19 U/L (ref 0–44)
AST: 16 U/L (ref 15–41)
Albumin: 3.8 g/dL (ref 3.5–5.0)
Alkaline Phosphatase: 108 U/L (ref 38–126)
Anion gap: 6 (ref 5–15)
BUN: 18 mg/dL (ref 8–23)
CO2: 32 mmol/L (ref 22–32)
Calcium: 9.2 mg/dL (ref 8.9–10.3)
Chloride: 99 mmol/L (ref 98–111)
Creatinine: 1.16 mg/dL (ref 0.61–1.24)
GFR, Estimated: >60 mL/min (ref >60)
Glucose, Bld: 242 mg/dL — ABNORMAL HIGH (ref 70–99)
Potassium: 4.2 mmol/L (ref 3.5–5.1)
Sodium: 137 mmol/L (ref 135–145)
Total Bilirubin: 0.6 mg/dL (ref 0.3–1.2)
Total Protein: 7.3 g/dL (ref 6.5–8.1)

## 2021-09-13 LAB — CBC WITH DIFFERENTIAL (CANCER CENTER ONLY)
Abs Immature Granulocytes: 0.03 10*3/uL (ref 0.00–0.07)
Basophils Absolute: 0 10*3/uL (ref 0.0–0.1)
Basophils Relative: 0 %
Eosinophils Absolute: 0 10*3/uL (ref 0.0–0.5)
Eosinophils Relative: 0 %
HCT: 41.1 % (ref 39.0–52.0)
Hemoglobin: 13.4 g/dL (ref 13.0–17.0)
Immature Granulocytes: 0 %
Lymphocytes Relative: 14 %
Lymphs Abs: 1.7 10*3/uL (ref 0.7–4.0)
MCH: 32.8 pg (ref 26.0–34.0)
MCHC: 32.6 g/dL (ref 30.0–36.0)
MCV: 100.5 fL — ABNORMAL HIGH (ref 80.0–100.0)
Monocytes Absolute: 0.7 10*3/uL (ref 0.1–1.0)
Monocytes Relative: 6 %
Neutro Abs: 9.6 10*3/uL — ABNORMAL HIGH (ref 1.7–7.7)
Neutrophils Relative %: 80 %
Platelet Count: 258 10*3/uL (ref 150–400)
RBC: 4.09 MIL/uL — ABNORMAL LOW (ref 4.22–5.81)
RDW: 14.3 % (ref 11.5–15.5)
WBC Count: 12.1 10*3/uL — ABNORMAL HIGH (ref 4.0–10.5)
nRBC: 0 % (ref 0.0–0.2)

## 2021-09-13 LAB — RAD ONC ARIA SESSION SUMMARY
Course Elapsed Days: 0
Plan Fractions Treated to Date: 1
Plan Prescribed Dose Per Fraction: 2 Gy
Plan Total Fractions Prescribed: 30
Plan Total Prescribed Dose: 60 Gy
Reference Point Dosage Given to Date: 2 Gy
Reference Point Session Dosage Given: 2 Gy
Session Number: 1

## 2021-09-13 MED ORDER — SODIUM CHLORIDE 0.9 % IV SOLN
284.8000 mg | Freq: Once | INTRAVENOUS | Status: AC
  Administered 2021-09-13: 280 mg via INTRAVENOUS
  Filled 2021-09-13: qty 28

## 2021-09-13 MED ORDER — PALONOSETRON HCL INJECTION 0.25 MG/5ML
0.2500 mg | Freq: Once | INTRAVENOUS | Status: AC
  Administered 2021-09-13: 0.25 mg via INTRAVENOUS
  Filled 2021-09-13: qty 5

## 2021-09-13 MED ORDER — SODIUM CHLORIDE 0.9 % IV SOLN
10.0000 mg | Freq: Once | INTRAVENOUS | Status: AC
  Administered 2021-09-13: 10 mg via INTRAVENOUS
  Filled 2021-09-13: qty 10

## 2021-09-13 MED ORDER — SODIUM CHLORIDE 0.9 % IV SOLN: Freq: Once | INTRAVENOUS | Status: AC

## 2021-09-13 MED ORDER — DIPHENHYDRAMINE HCL 50 MG/ML IJ SOLN
50.0000 mg | Freq: Once | INTRAMUSCULAR | Status: AC
  Administered 2021-09-13: 50 mg via INTRAVENOUS
  Filled 2021-09-13: qty 1

## 2021-09-13 MED ORDER — FAMOTIDINE IN NACL 20-0.9 MG/50ML-% IV SOLN
20.0000 mg | Freq: Once | INTRAVENOUS | Status: AC
  Administered 2021-09-13: 20 mg via INTRAVENOUS
  Filled 2021-09-13: qty 50

## 2021-09-13 MED ORDER — SODIUM CHLORIDE 0.9 % IV SOLN
45.0000 mg/m2 | Freq: Once | INTRAVENOUS | Status: AC
  Administered 2021-09-13: 114 mg via INTRAVENOUS
  Filled 2021-09-13: qty 19

## 2021-09-13 NOTE — Patient Instructions (Signed)
[facility]  Discharge Instructions: ?Thank you for choosing [facility] to provide your oncology and hematology care.  ? ?If you have a lab appointment with the [facility], please go directly to the [facility] and check in at the registration area. ?  ?Wear comfortable clothing and clothing appropriate for easy access to any Portacath or PICC line.  ? ?We strive to give you quality time with your provider. You may need to reschedule your appointment if you arrive late (15 or more minutes).  Arriving late affects you and other patients whose appointments are after yours.  Also, if you miss three or more appointments without notifying the office, you may be dismissed from the clinic at the provider?s discretion.    ?  ?For prescription refill requests, have your pharmacy contact our office and allow 72 hours for refills to be completed.   ? ?Today you received the following chemotherapy and/or immunotherapy agents: Carboplatin, Paclitaxel.    ?  ?To help prevent nausea and vomiting after your treatment, we encourage you to take your nausea medication as directed. ? ?BELOW ARE SYMPTOMS THAT SHOULD BE REPORTED IMMEDIATELY: ?*FEVER GREATER THAN 100.4 F (38 ?C) OR HIGHER ?*CHILLS OR SWEATING ?*NAUSEA AND VOMITING THAT IS NOT CONTROLLED WITH YOUR NAUSEA MEDICATION ?*UNUSUAL SHORTNESS OF BREATH ?*UNUSUAL BRUISING OR BLEEDING ?*URINARY PROBLEMS (pain or burning when urinating, or frequent urination) ?*BOWEL PROBLEMS (unusual diarrhea, constipation, pain near the anus) ?TENDERNESS IN MOUTH AND THROAT WITH OR WITHOUT PRESENCE OF ULCERS (sore throat, sores in mouth, or a toothache) ?UNUSUAL RASH, SWELLING OR PAIN  ?UNUSUAL VAGINAL DISCHARGE OR ITCHING  ? ?Items with * indicate a potential emergency and should be followed up as soon as possible or go to the Emergency Department if any problems should occur. ? ?Please show the CHEMOTHERAPY ALERT CARD or IMMUNOTHERAPY ALERT CARD  at check-in to the Emergency Department and triage nurse. ? ?Should you have questions after your visit or need to cancel or reschedule your appointment, please contact [facility]  Dept: [phone]  and follow the prompts.  Office hours are 8:00 a.m. to 4:30 p.m. Monday - Friday. Please note that voicemails left after 4:00 p.m. may not be returned until the following business day.  We are closed weekends and major holidays. You have access to a nurse at all times for urgent questions. Please call the main number to the clinic Dept: [phone] and follow the prompts. ? ? ?For any non-urgent questions, you may also contact your provider using MyChart. We now offer e-Visits for anyone 6 and older to request care online for non-urgent symptoms. For details visit [email or web address]. ?  ?Also download the MyChart app! Go to the app store, search "MyChart", open the app, select [facility], and log in with your MyChart username and password. ? ?Due to Covid, a mask is required upon entering the hospital/clinic. If you do not have a mask, one will be given to you upon arrival. For doctor visits, patients may have 1 support person aged 77 or older with them. For treatment visits, patients cannot have anyone with them due to current Covid guidelines and our immunocompromised population.  ? ?Carboplatin injection ?What is this medication? ?CARBOPLATIN (KAR boe pla tin) is a chemotherapy drug. It targets fast dividing cells, like cancer cells, and causes these cells to die. This medicine is used to treat ovarian cancer and many other cancers. ?This medicine may be used for other  purposes; ask your health care provider or pharmacist if you have questions. ?COMMON BRAND NAME(S): Paraplatin ?What should I tell my care team before I take this medication? ?They need to know if you have any of these conditions: ?blood disorders ?hearing problems ?kidney disease ?recent or ongoing radiation  therapy ?an unusual or allergic reaction to carboplatin, cisplatin, other chemotherapy, other medicines, foods, dyes, or preservatives ?pregnant or trying to get pregnant ?breast-feeding ?How should I use this medication? ?This drug is usually given as an infusion into a vein. It is administered in a hospital or clinic by a specially trained health care professional. ?Talk to your pediatrician regarding the use of this medicine in children. Special care may be needed. ?Overdosage: If you think you have taken too much of this medicine contact a poison control center or emergency room at once. ?NOTE: This medicine is only for you. Do not share this medicine with others. ?What if I miss a dose? ?It is important not to miss a dose. Call your doctor or health care professional if you are unable to keep an appointment. ?What may interact with this medication? ?medicines for seizures ?medicines to increase blood counts like filgrastim, pegfilgrastim, sargramostim ?some antibiotics like amikacin, gentamicin, neomycin, streptomycin, tobramycin ?vaccines ?Talk to your doctor or health care professional before taking any of these medicines: ?acetaminophen ?aspirin ?ibuprofen ?ketoprofen ?naproxen ?This list may not describe all possible interactions. Give your health care provider a list of all the medicines, herbs, non-prescription drugs, or dietary supplements you use. Also tell them if you smoke, drink alcohol, or use illegal drugs. Some items may interact with your medicine. ?What should I watch for while using this medication? ?Your condition will be monitored carefully while you are receiving this medicine. You will need important blood work done while you are taking this medicine. ?This drug may make you feel generally unwell. This is not uncommon, as chemotherapy can affect healthy cells as well as cancer cells. Report any side effects. Continue your course of treatment even though you feel ill unless your doctor tells  you to stop. ?In some cases, you may be given additional medicines to help with side effects. Follow all directions for their use. ?Call your doctor or health care professional for advice if you get a fever, chills or sore throat, or other symptoms of a cold or flu. Do not treat yourself. This drug decreases your body's ability to fight infections. Try to avoid being around people who are sick. ?This medicine may increase your risk to bruise or bleed. Call your doctor or health care professional if you notice any unusual bleeding. ?Be careful brushing and flossing your teeth or using a toothpick because you may get an infection or bleed more easily. If you have any dental work done, tell your dentist you are receiving this medicine. ?Avoid taking products that contain aspirin, acetaminophen, ibuprofen, naproxen, or ketoprofen unless instructed by your doctor. These medicines may hide a fever. ?Do not become pregnant while taking this medicine. Women should inform their doctor if they wish to become pregnant or think they might be pregnant. There is a potential for serious side effects to an unborn child. Talk to your health care professional or pharmacist for more information. Do not breast-feed an infant while taking this medicine. ?What side effects may I notice from receiving this medication? ?Side effects that you should report to your doctor or health care professional as soon as possible: ?allergic reactions like skin rash, itching or hives,  swelling of the face, lips, or tongue ?signs of infection - fever or chills, cough, sore throat, pain or difficulty passing urine ?signs of decreased platelets or bleeding - bruising, pinpoint red spots on the skin, black, tarry stools, nosebleeds ?signs of decreased red blood cells - unusually weak or tired, fainting spells, lightheadedness ?breathing problems ?changes in hearing ?changes in vision ?chest pain ?high blood pressure ?low blood counts - This drug may  decrease the number of white blood cells, red blood cells and platelets. You may be at increased risk for infections and bleeding. ?nausea and vomiting ?pain, swelling, redness or irritation at the injection s

## 2021-09-14 ENCOUNTER — Telehealth: Payer: Self-pay | Admitting: *Deleted

## 2021-09-14 ENCOUNTER — Other Ambulatory Visit: Payer: Self-pay

## 2021-09-14 ENCOUNTER — Ambulatory Visit
Admission: RE | Admit: 2021-09-14 | Discharge: 2021-09-14 | Disposition: A | Discharge status: Home / Self Care | Payer: Medicare HMO | Source: Ambulatory Visit | Attending: Radiation Oncology | Admitting: Radiation Oncology

## 2021-09-14 DIAGNOSIS — Z510 Encounter for antineoplastic radiation therapy: Secondary | ICD-10-CM | POA: Diagnosis not present

## 2021-09-14 DIAGNOSIS — R69 Illness, unspecified: Secondary | ICD-10-CM | POA: Diagnosis not present

## 2021-09-14 DIAGNOSIS — C3412 Malignant neoplasm of upper lobe, left bronchus or lung: Secondary | ICD-10-CM | POA: Diagnosis not present

## 2021-09-14 LAB — RAD ONC ARIA SESSION SUMMARY
Course Elapsed Days: 1
Plan Fractions Treated to Date: 2
Plan Prescribed Dose Per Fraction: 2 Gy
Plan Total Fractions Prescribed: 30
Plan Total Prescribed Dose: 60 Gy
Reference Point Dosage Given to Date: 4 Gy
Reference Point Session Dosage Given: 2 Gy
Session Number: 2

## 2021-09-14 NOTE — Telephone Encounter (Signed)
Pt. called for follow up after he received his first dose of Taxol and Carboplatin. States he is feeling "fine" no other issues except some tiredness. He is on his way to [facility] for radiation. Instructed pt. to call with any issues or concerns. ?

## 2021-09-14 NOTE — Telephone Encounter (Signed)
-----   Message from Rafael Bihari, RN sent at 09/13/2021  3:48 PM EDT ----- ?Regarding: Dr Julien Nordmann, first time Taxol/Carbo. ?Dr. Julien Nordmann pt, first time Paclitaxel/Carboplatin. Tolerated infusion well. Needs call back.  ? ?

## 2021-09-15 ENCOUNTER — Ambulatory Visit
Admission: RE | Admit: 2021-09-15 | Discharge: 2021-09-15 | Disposition: A | Discharge status: Home / Self Care | Payer: Medicare HMO | Source: Ambulatory Visit | Attending: Radiation Oncology | Admitting: Radiation Oncology

## 2021-09-15 ENCOUNTER — Other Ambulatory Visit: Payer: Self-pay

## 2021-09-15 DIAGNOSIS — C3412 Malignant neoplasm of upper lobe, left bronchus or lung: Secondary | ICD-10-CM | POA: Diagnosis not present

## 2021-09-15 DIAGNOSIS — R69 Illness, unspecified: Secondary | ICD-10-CM | POA: Diagnosis not present

## 2021-09-15 DIAGNOSIS — Z510 Encounter for antineoplastic radiation therapy: Secondary | ICD-10-CM | POA: Diagnosis not present

## 2021-09-15 LAB — RAD ONC ARIA SESSION SUMMARY
Course Elapsed Days: 2
Plan Fractions Treated to Date: 3
Plan Prescribed Dose Per Fraction: 2 Gy
Plan Total Fractions Prescribed: 30
Plan Total Prescribed Dose: 60 Gy
Reference Point Dosage Given to Date: 6 Gy
Reference Point Session Dosage Given: 2 Gy
Session Number: 3

## 2021-09-15 NOTE — Progress Notes (Incomplete)
Pt here for patient teaching.  Pt given Radiation and You booklet, skin care instructions, and Sonafine.  Reviewed areas of pertinence such as fatigue, hair loss, skin changes, throat changes, cough, and shortness of breath . Pt able to give teach back of to pat skin and use unscented/gentle soap,apply Sonafine bid and avoid applying anything to skin within 4 hours of treatment. Pt verbalizes understanding of information given and will contact nursing with any questions or concerns.   ?

## 2021-09-16 ENCOUNTER — Other Ambulatory Visit: Payer: Self-pay

## 2021-09-16 ENCOUNTER — Ambulatory Visit
Admission: RE | Admit: 2021-09-16 | Discharge: 2021-09-16 | Disposition: A | Discharge status: Home / Self Care | Payer: Medicare HMO | Source: Ambulatory Visit | Attending: Radiation Oncology | Admitting: Radiation Oncology

## 2021-09-16 DIAGNOSIS — R69 Illness, unspecified: Secondary | ICD-10-CM | POA: Diagnosis not present

## 2021-09-16 DIAGNOSIS — Z510 Encounter for antineoplastic radiation therapy: Secondary | ICD-10-CM | POA: Diagnosis not present

## 2021-09-16 DIAGNOSIS — C3412 Malignant neoplasm of upper lobe, left bronchus or lung: Secondary | ICD-10-CM | POA: Diagnosis not present

## 2021-09-16 LAB — RAD ONC ARIA SESSION SUMMARY
Course Elapsed Days: 3
Plan Fractions Treated to Date: 4
Plan Prescribed Dose Per Fraction: 2 Gy
Plan Total Fractions Prescribed: 30
Plan Total Prescribed Dose: 60 Gy
Reference Point Dosage Given to Date: 8 Gy
Reference Point Session Dosage Given: 2 Gy
Session Number: 4

## 2021-09-17 ENCOUNTER — Ambulatory Visit
Admission: RE | Admit: 2021-09-17 | Discharge: 2021-09-17 | Disposition: A | Discharge status: Home / Self Care | Payer: Medicare HMO | Source: Ambulatory Visit | Attending: Radiation Oncology | Admitting: Radiation Oncology

## 2021-09-17 ENCOUNTER — Other Ambulatory Visit: Payer: Self-pay

## 2021-09-17 DIAGNOSIS — R69 Illness, unspecified: Secondary | ICD-10-CM | POA: Diagnosis not present

## 2021-09-17 DIAGNOSIS — Z510 Encounter for antineoplastic radiation therapy: Secondary | ICD-10-CM | POA: Diagnosis not present

## 2021-09-17 DIAGNOSIS — C3412 Malignant neoplasm of upper lobe, left bronchus or lung: Secondary | ICD-10-CM | POA: Diagnosis not present

## 2021-09-17 LAB — RAD ONC ARIA SESSION SUMMARY
Course Elapsed Days: 4
Plan Fractions Treated to Date: 5
Plan Prescribed Dose Per Fraction: 2 Gy
Plan Total Fractions Prescribed: 30
Plan Total Prescribed Dose: 60 Gy
Reference Point Dosage Given to Date: 10 Gy
Reference Point Session Dosage Given: 2 Gy
Session Number: 5

## 2021-09-17 MED FILL — Dexamethasone Sodium Phosphate Inj 100 MG/10ML: INTRAMUSCULAR | Qty: 1 | Status: AC

## 2021-09-20 ENCOUNTER — Encounter (HOSPITAL_COMMUNITY): Payer: Self-pay | Admitting: *Deleted

## 2021-09-20 ENCOUNTER — Encounter: Payer: Self-pay | Admitting: Internal Medicine

## 2021-09-20 ENCOUNTER — Inpatient Hospital Stay: Payer: Medicare HMO

## 2021-09-20 ENCOUNTER — Ambulatory Visit: Payer: Medicare HMO

## 2021-09-20 ENCOUNTER — Other Ambulatory Visit: Payer: Self-pay

## 2021-09-20 ENCOUNTER — Inpatient Hospital Stay: Payer: Medicare HMO | Attending: Internal Medicine | Admitting: Internal Medicine

## 2021-09-20 ENCOUNTER — Emergency Department (HOSPITAL_COMMUNITY): Payer: Medicare HMO

## 2021-09-20 ENCOUNTER — Emergency Department (HOSPITAL_COMMUNITY)
Admission: EM | Admit: 2021-09-20 | Discharge: 2021-09-20 | Disposition: A | Discharge status: Home / Self Care | Payer: Medicare HMO | Attending: Emergency Medicine | Admitting: Emergency Medicine

## 2021-09-20 VITALS — BP 110/82 | HR 132 | Temp 97.4°F | Resp 18 | Wt 264.4 lb

## 2021-09-20 DIAGNOSIS — C3492 Malignant neoplasm of unspecified part of left bronchus or lung: Secondary | ICD-10-CM

## 2021-09-20 DIAGNOSIS — I4892 Unspecified atrial flutter: Secondary | ICD-10-CM | POA: Diagnosis not present

## 2021-09-20 DIAGNOSIS — Z7901 Long term (current) use of anticoagulants: Secondary | ICD-10-CM | POA: Diagnosis not present

## 2021-09-20 DIAGNOSIS — C3490 Malignant neoplasm of unspecified part of unspecified bronchus or lung: Secondary | ICD-10-CM | POA: Diagnosis not present

## 2021-09-20 DIAGNOSIS — R918 Other nonspecific abnormal finding of lung field: Secondary | ICD-10-CM | POA: Diagnosis not present

## 2021-09-20 DIAGNOSIS — Z5111 Encounter for antineoplastic chemotherapy: Secondary | ICD-10-CM | POA: Diagnosis not present

## 2021-09-20 DIAGNOSIS — Z7984 Long term (current) use of oral hypoglycemic drugs: Secondary | ICD-10-CM | POA: Diagnosis not present

## 2021-09-20 DIAGNOSIS — Z79899 Other long term (current) drug therapy: Secondary | ICD-10-CM | POA: Insufficient documentation

## 2021-09-20 DIAGNOSIS — C3412 Malignant neoplasm of upper lobe, left bronchus or lung: Secondary | ICD-10-CM | POA: Insufficient documentation

## 2021-09-20 DIAGNOSIS — Z452 Encounter for adjustment and management of vascular access device: Secondary | ICD-10-CM | POA: Insufficient documentation

## 2021-09-20 LAB — TROPONIN I (HIGH SENSITIVITY)
Troponin I (High Sensitivity): 12 ng/L (ref <18)
Troponin I (High Sensitivity): 14 ng/L (ref <18)

## 2021-09-20 LAB — CBC WITH DIFFERENTIAL (CANCER CENTER ONLY)
Abs Immature Granulocytes: 0.06 10*3/uL (ref 0.00–0.07)
Basophils Absolute: 0 10*3/uL (ref 0.0–0.1)
Basophils Relative: 1 %
Eosinophils Absolute: 0 10*3/uL (ref 0.0–0.5)
Eosinophils Relative: 0 %
HCT: 36.3 % — ABNORMAL LOW (ref 39.0–52.0)
Hemoglobin: 12.1 g/dL — ABNORMAL LOW (ref 13.0–17.0)
Immature Granulocytes: 1 %
Lymphocytes Relative: 11 %
Lymphs Abs: 0.9 10*3/uL (ref 0.7–4.0)
MCH: 33.2 pg (ref 26.0–34.0)
MCHC: 33.3 g/dL (ref 30.0–36.0)
MCV: 99.7 fL (ref 80.0–100.0)
Monocytes Absolute: 0.4 10*3/uL (ref 0.1–1.0)
Monocytes Relative: 5 %
Neutro Abs: 7 10*3/uL (ref 1.7–7.7)
Neutrophils Relative %: 82 %
Platelet Count: 276 10*3/uL (ref 150–400)
RBC: 3.64 MIL/uL — ABNORMAL LOW (ref 4.22–5.81)
RDW: 14.1 % (ref 11.5–15.5)
WBC Count: 8.5 10*3/uL (ref 4.0–10.5)
nRBC: 0.2 % (ref 0.0–0.2)

## 2021-09-20 LAB — BASIC METABOLIC PANEL
Anion gap: 7 (ref 5–15)
BUN: 23 mg/dL (ref 8–23)
CO2: 30 mmol/L (ref 22–32)
Calcium: 8.8 mg/dL — ABNORMAL LOW (ref 8.9–10.3)
Chloride: 101 mmol/L (ref 98–111)
Creatinine, Ser: 1.07 mg/dL (ref 0.61–1.24)
GFR, Estimated: >60 mL/min (ref >60)
Glucose, Bld: 118 mg/dL — ABNORMAL HIGH (ref 70–99)
Potassium: 4.1 mmol/L (ref 3.5–5.1)
Sodium: 138 mmol/L (ref 135–145)

## 2021-09-20 LAB — CMP (CANCER CENTER ONLY)
ALT: 20 U/L (ref 0–44)
AST: 16 U/L (ref 15–41)
Albumin: 3.5 g/dL (ref 3.5–5.0)
Alkaline Phosphatase: 90 U/L (ref 38–126)
Anion gap: 6 (ref 5–15)
BUN: 22 mg/dL (ref 8–23)
CO2: 33 mmol/L — ABNORMAL HIGH (ref 22–32)
Calcium: 8.9 mg/dL (ref 8.9–10.3)
Chloride: 100 mmol/L (ref 98–111)
Creatinine: 1 mg/dL (ref 0.61–1.24)
GFR, Estimated: >60 mL/min (ref >60)
Glucose, Bld: 170 mg/dL — ABNORMAL HIGH (ref 70–99)
Potassium: 3.8 mmol/L (ref 3.5–5.1)
Sodium: 139 mmol/L (ref 135–145)
Total Bilirubin: 0.7 mg/dL (ref 0.3–1.2)
Total Protein: 6.7 g/dL (ref 6.5–8.1)

## 2021-09-20 LAB — CBC
HCT: 37.5 % — ABNORMAL LOW (ref 39.0–52.0)
Hemoglobin: 12.5 g/dL — ABNORMAL LOW (ref 13.0–17.0)
MCH: 33.8 pg (ref 26.0–34.0)
MCHC: 33.3 g/dL (ref 30.0–36.0)
MCV: 101.4 fL — ABNORMAL HIGH (ref 80.0–100.0)
Platelets: 272 10*3/uL (ref 150–400)
RBC: 3.7 MIL/uL — ABNORMAL LOW (ref 4.22–5.81)
RDW: 14.3 % (ref 11.5–15.5)
WBC: 8.9 10*3/uL (ref 4.0–10.5)
nRBC: 0 % (ref 0.0–0.2)

## 2021-09-20 LAB — MAGNESIUM: Magnesium: 1.6 mg/dL — ABNORMAL LOW (ref 1.7–2.4)

## 2021-09-20 MED ORDER — PROPOFOL 10 MG/ML IV BOLUS
0.5000 mg/kg | Freq: Once | INTRAVENOUS | Status: AC
  Administered 2021-09-20: 59.9 mg via INTRAVENOUS
  Filled 2021-09-20: qty 20

## 2021-09-20 MED ORDER — MAGNESIUM SULFATE 2 GM/50ML IV SOLN
2.0000 g | Freq: Once | INTRAVENOUS | Status: AC
  Administered 2021-09-20: 2 g via INTRAVENOUS
  Filled 2021-09-20: qty 50

## 2021-09-20 NOTE — ED Provider Notes (Signed)
?[facility] DEPT ?Provider Note ? ? ?CSN: [id number] ?Arrival date & time: 09/20/21  4128 ? ?  ? ?History ? ?Chief Complaint  ?Patient presents with  ? Atrial Flutter  ? ? ?[name] is a 66 y.o. male.  He has a history of lung cancer and a flutter, on anticoagulation.  He went to his oncology appointment today and was found to be tachycardic.  Patient states his normal heart rate is usually in the 80s.  He has baseline shortness of breath and cough.  No chest pain or fevers.  He is active chemotherapy with Dr. Julien Nordmann.  Denies any recent change in medications.  He said he was diagnosed with A-fib and CHF back in January and was cardioverted.  Follows with [facility] cardiology ? ?The history is provided by the patient and the spouse.  ?Palpitations ?Palpitations quality:  Fast ?Onset quality:  Unable to specify ?Progression:  Unchanged ?Chronicity:  Recurrent ?Relieved by:  None tried ?Worsened by:  Nothing ?Ineffective treatments:  None tried ?Associated symptoms: cough and shortness of breath   ?Associated symptoms: no chest pain, no nausea, no near-syncope, no syncope and no vomiting   ?Risk factors: hx of atrial fibrillation   ? ?  ? ?Home Medications ?Prior to Admission medications   ?Medication Sig Start Date End Date Taking? Authorizing Provider  ?acetaminophen (TYLENOL) 325 MG tablet Take 2 tablets (650 mg total) by mouth every 6 (six) hours as needed for mild pain (or Fever >/= 101). 06/05/21   Roxan Hockey, MD  ?albuterol (PROVENTIL) (2.5 MG/3ML) 0.083% nebulizer solution Take 3 mLs by nebulization every 6 (six) hours as needed for wheezing or shortness of breath. ?Patient not taking: Reported on 09/01/2021 06/05/21   Roxan Hockey, MD  ?albuterol (VENTOLIN HFA) 108 (90 Base) MCG/ACT inhaler Inhale 2 puffs into the lungs every 6 (six) hours as needed for wheezing or shortness of breath. ?Patient not taking: Reported on 09/01/2021 06/05/21   Roxan Hockey, MD  ?apixaban  (ELIQUIS) 5 MG TABS tablet Take 1 tablet (5 mg total) by mouth 2 (two) times daily. 06/05/21   Roxan Hockey, MD  ?atorvastatin (LIPITOR) 20 MG tablet Take 1 tablet (20 mg total) by mouth every evening. 06/05/21   Roxan Hockey, MD  ?bisoprolol (ZEBETA) 5 MG tablet Take 1 tablet (5 mg total) by mouth daily. For BP and Heart Rate Control 06/06/21   Roxan Hockey, MD  ?Budeson-Glycopyrrol-Formoterol (BREZTRI AEROSPHERE) 160-9-4.8 MCG/ACT AERO Inhale 2 puffs into the lungs in the morning and at bedtime. 07/08/21   Tanda Rockers, MD  ?glimepiride (AMARYL) 2 MG tablet Take 1 tablet (2 mg total) by mouth every morning. 06/05/21 06/05/22  Roxan Hockey, MD  ?Multiple Vitamin (MULTI-VITAMIN DAILY PO) Take 1 tablet by mouth daily.    [provider]  ?potassium chloride (KLOR-CON) 10 MEQ tablet Take 1 tablet (10 mEq total) by mouth daily. Take While taking Lasix/furosemide 06/05/21   Roxan Hockey, MD  ?prochlorperazine (COMPAZINE) 10 MG tablet Take 1 tablet (10 mg total) by mouth every 6 (six) hours as needed for nausea or vomiting. 09/01/21   Curt Bears, MD  ?torsemide 40 MG TABS Take 40 mg by mouth every morning. 06/05/21   Roxan Hockey, MD  ?   ? ?Allergies    ?Penicillins   ? ?Review of Systems   ?Review of Systems  ?Constitutional:  Negative for fever.  ?HENT:  Negative for sore throat.   ?Eyes:  Negative for visual disturbance.  ?Respiratory:  Positive for cough and shortness of breath.   ?Cardiovascular:  Positive for palpitations. Negative for chest pain, syncope and near-syncope.  ?Gastrointestinal:  Negative for abdominal pain, nausea and vomiting.  ?Genitourinary:  Negative for dysuria.  ?Skin:  Negative for rash.  ?Neurological:  Negative for headaches.  ? ?Physical Exam ?Updated Vital Signs ?BP (!) 113/92 (BP Location: Left Arm)   Pulse (!) 135   Temp 97.9 ?F (36.6 ?C) (Oral)   Resp (!) 24   Ht 6' (1.829 m)   Wt 119.7 kg   SpO2 96%   BMI 35.80 kg/m?  ?Physical Exam ?Vitals and  nursing note reviewed.  ?Constitutional:   ?   General: He is not in acute distress. ?   Appearance: Normal appearance. He is well-developed.  ?HENT:  ?   Head: Normocephalic and atraumatic.  ?Eyes:  ?   Conjunctiva/sclera: Conjunctivae normal.  ?Cardiovascular:  ?   Rate and Rhythm: Regular rhythm. Tachycardia present.  ?   Heart sounds: No murmur heard. ?Pulmonary:  ?   Effort: Pulmonary effort is normal. No respiratory distress.  ?   Breath sounds: Normal breath sounds.  ?Abdominal:  ?   Palpations: Abdomen is soft.  ?   Tenderness: There is no abdominal tenderness. There is no guarding or rebound.  ?Musculoskeletal:     ?   General: No swelling.  ?   Cervical back: Neck supple.  ?   Right lower leg: Edema present.  ?   Left lower leg: Edema present.  ?Skin: ?   General: Skin is warm and dry.  ?   Capillary Refill: Capillary refill takes less than 2 seconds.  ?Neurological:  ?   General: No focal deficit present.  ?   Mental Status: He is alert.  ? ? ?ED Results / Procedures / Treatments   ?Labs ?(all labs ordered are listed, but only abnormal results are displayed) ?Labs Reviewed  ?BASIC METABOLIC PANEL - Abnormal; Notable for the following components:  ?    Result Value  ? Glucose, Bld 118 (*)   ? Calcium 8.8 (*)   ? All other components within normal limits  ?CBC - Abnormal; Notable for the following components:  ? RBC 3.70 (*)   ? Hemoglobin 12.5 (*)   ? HCT 37.5 (*)   ? MCV 101.4 (*)   ? All other components within normal limits  ?MAGNESIUM - Abnormal; Notable for the following components:  ? Magnesium 1.6 (*)   ? All other components within normal limits  ?TROPONIN I (HIGH SENSITIVITY)  ?TROPONIN I (HIGH SENSITIVITY)  ? ? ?EKG ?EKG Interpretation ? ?Date/Time:  Monday Sep 20 2021 13:34:38 EDT ?Ventricular Rate:  84 ?PR Interval:  138 ?QRS Duration: 107 ?QT Interval:  368 ?QTC Calculation: 435 ?R Axis:   89 ?Text Interpretation: Sinus rhythm Supraventricular bigeminy Left atrial enlargement Consider right  ventricular hypertrophy Baseline wander in lead(s) V4 Confirmed by [name] [phone]) on 09/20/2021 1:40:42 PM ? ?Radiology ?DG Chest 2 View ? ?Result Date: 09/20/2021 ?CLINICAL DATA:  Atrial flutter. EXAM: CHEST - 2 VIEW COMPARISON:  Radiographs 07/08/2021. CT 07/23/2021 and PET-CT 08/12/2021. FINDINGS: The heart size and mediastinal contours are stable, although the left heart border remains partially obscured by an enlarging and ill-defined mass in the lingula. The right lung is clear. There is no pleural effusion or pneumothorax. No acute osseous findings are evident. There are old left-sided rib fractures and mild degenerative changes throughout the spine. Telemetry leads overlie the chest. IMPRESSION:  1. Previously demonstrated left upper lobe mass has enlarged from prior radiographs with increased surrounding patchy airspace disease. This lesion was hypermetabolic on prior PET-CT and presumably reflects lung cancer. 2. No definite acute superimposed process. Electronically Signed   By: Richardean Sale M.D.   On: 09/20/2021 10:15   ? ?Procedures ?.Cardioversion ? ?Date/Time: 09/20/2021 1:38 PM ?Performed by: Hayden Rasmussen, MD ?Authorized by: Hayden Rasmussen, MD  ? ?Consent:  ?  Consent obtained:  Written ?  Consent given by:  Patient ?  Risks discussed:  Cutaneous burn, death, induced arrhythmia and pain ?  Alternatives discussed:  No treatment, alternative treatment and referral ?Pre-procedure details:  ?  Cardioversion basis:  Elective ?  Rhythm:  Atrial flutter ?  Electrode placement:  Anterior-posterior ?Patient sedated: Yes. Refer to sedation procedure documentation for details of sedation. ? ?Attempt one:  ?  Cardioversion mode:  Synchronous ?  Shock (joules) attempt one: 120. ?  Shock outcome:  Conversion to normal sinus rhythm ?Post-procedure details:  ?  Patient status:  Awake ?  Patient tolerance of procedure:  Tolerated well, no immediate complications  ? ? ?Medications Ordered in  ED ?Medications  ?magnesium sulfate IVPB 2 g 50 mL (0 g Intravenous Stopped 09/20/21 1219)  ?propofol (DIPRIVAN) 10 mg/mL bolus/IV push 59.9 mg (59.9 mg Intravenous Given 09/20/21 1337)  ? ? ?ED Course/ Medical Decision Making

## 2021-09-20 NOTE — Discharge Instructions (Signed)
You were seen in the emergency department for an elevated heart rate.  Your EKG showed you were in atrial flutter.  Your magnesium was mildly low and this was repleted.  You were cardioverted and are now back in normal sinus rhythm.  Please continue your regular medications and follow-up with your primary care doctor and your cardiologist.  Return to the emergency department if any worsening or concerning symptoms. ?

## 2021-09-20 NOTE — Progress Notes (Signed)
?    [facility] ?[phone]) [phone]   [phone] ? ?OFFICE PROGRESS NOTE ? ?Cory Munch, PA-C ?[address] ?[place] [address] ? ?DIAGNOSIS:  Stage IIIb (T3, N2, M0) non-small cell lung cancer, squamous cell carcinoma presented with large left upper lobe lung mass in addition to mediastinal lymphadenopathy diagnosed in April 2023. ?The patient has a history of colon cancer treated at [facility] in 2014 status post surgical resection followed by adjuvant systemic chemotherapy. ? ?PRIOR THERAPY:None. ? ?CURRENT THERAPY: Concurrent chemoradiation with weekly carboplatin for AUC of 2 and paclitaxel 45 Mg/M2.  Status post 1 cycle. ? ?INTERVAL HISTORY: ?[name] 66 y.o. male returns to the clinic today for follow-up visit accompanied by his wife.  The patient is feeling fine today with no concerning complaints except for the baseline shortness of breath.  He tolerated the first week of his concurrent chemoradiation fairly well.  He denied having any significant nausea, vomiting, diarrhea or constipation.  He has no chest pain but continues to have the baseline shortness of breath with mild cough and no hemoptysis.  He is tachycardic today.  He denied having any fever or chills.  He has no significant weight loss or night sweats.  He was here today for evaluation before starting cycle #2 of his treatment.  He is followed by Dr. Conni Elliot from [facility] in [place]. ? ?MEDICAL HISTORY: ?Past Medical History:  ?Diagnosis Date  ? Atrial flutter ([place])   ? s/p DCCV 06/03/21  ? CHF (congestive heart failure) ([place])   ? Colon cancer ([place]) 04/22/2013  ? Hypertension   ? Lung cancer ([place]) 08/26/2021  ? Renal disorder   ? Kidney stones  ? ? ?ALLERGIES:  is allergic to penicillins. ? ?MEDICATIONS:  ?Current Outpatient Medications  ?Medication Sig Dispense Refill  ? acetaminophen (TYLENOL) 325 MG tablet Take 2 tablets (650 mg total) by mouth every 6 (six) hours  as needed for mild pain (or Fever >/= 101). 12 tablet 0  ? albuterol (PROVENTIL) (2.5 MG/3ML) 0.083% nebulizer solution Take 3 mLs by nebulization every 6 (six) hours as needed for wheezing or shortness of breath. (Patient not taking: Reported on 09/01/2021) 75 mL 12  ? albuterol (VENTOLIN HFA) 108 (90 Base) MCG/ACT inhaler Inhale 2 puffs into the lungs every 6 (six) hours as needed for wheezing or shortness of breath. (Patient not taking: Reported on 09/01/2021) 18 g 2  ? apixaban (ELIQUIS) 5 MG TABS tablet Take 1 tablet (5 mg total) by mouth 2 (two) times daily. 60 tablet 2  ? atorvastatin (LIPITOR) 20 MG tablet Take 1 tablet (20 mg total) by mouth every evening. 30 tablet 3  ? bisoprolol (ZEBETA) 5 MG tablet Take 1 tablet (5 mg total) by mouth daily. For BP and Heart Rate Control 30 tablet 5  ? Budeson-Glycopyrrol-Formoterol (BREZTRI AEROSPHERE) 160-9-4.8 MCG/ACT AERO Inhale 2 puffs into the lungs in the morning and at bedtime. 10.7 g 0  ? glimepiride (AMARYL) 2 MG tablet Take 1 tablet (2 mg total) by mouth every morning. 30 tablet 11  ? Multiple Vitamin (MULTI-VITAMIN DAILY PO) Take 1 tablet by mouth daily.    ? potassium chloride (KLOR-CON) 10 MEQ tablet Take 1 tablet (10 mEq total) by mouth daily. Take While taking Lasix/furosemide 30 tablet 2  ? prochlorperazine (COMPAZINE) 10 MG tablet Take 1 tablet (10 mg total) by mouth every 6 (six) hours as needed for nausea or vomiting. 30 tablet 0  ?  torsemide 40 MG TABS Take 40 mg by mouth every morning. 30 tablet 2  ? ?No current facility-administered medications for this visit.  ? ? ?SURGICAL HISTORY:  ?Past Surgical History:  ?Procedure Laterality Date  ? BRONCHIAL BIOPSY  08/26/2021  ? Procedure: BRONCHIAL BIOPSIES;  Surgeon: Garner Nash, DO;  Location: [facility];  Service: Pulmonary;;  ? BRONCHIAL BRUSHINGS  08/26/2021  ? Procedure: BRONCHIAL BRUSHINGS;  Surgeon: Garner Nash, DO;  Location: [place];  Service: Pulmonary;;  ? BRONCHIAL NEEDLE ASPIRATION  BIOPSY  08/26/2021  ? Procedure: BRONCHIAL NEEDLE ASPIRATION BIOPSIES;  Surgeon: Garner Nash, DO;  Location: [facility];  Service: Pulmonary;;  ? CARDIOVERSION N/A 06/03/2021  ? Procedure: CARDIOVERSION;  Surgeon: Satira Sark, MD;  Location: [facility];  Service: Cardiovascular;  Laterality: N/A;  ? COLONOSCOPY N/A 04/29/2013  ? Procedure: COLONOSCOPY;  Surgeon: Danie Binder, MD;  [facility];  Service: Endoscopy;  Laterality: N/A;  10:30AM  ? Cyst removed from left elbow    ? ENDOVENOUS ABLATION SAPHENOUS VEIN W/ LASER Left 01/16/2019  ? endovenous laser ablation left greater saphenous vein by Ruta Hinds MD  ? KIDNEY STONE SURGERY    ? LITHOTRIPSY    ? PARTIAL COLECTOMY    ? TEE WITHOUT CARDIOVERSION N/A 06/03/2021  ? Procedure: TRANSESOPHAGEAL ECHOCARDIOGRAM (TEE);  Surgeon: Satira Sark, MD;  Location: [facility];  Service: Cardiovascular;  Laterality: N/A;  ? VIDEO BRONCHOSCOPY WITH ENDOBRONCHIAL ULTRASOUND Bilateral 08/26/2021  ? Procedure: VIDEO BRONCHOSCOPY WITH ENDOBRONCHIAL ULTRASOUND;  Surgeon: Garner Nash, DO;  Location: [facility];  Service: Pulmonary;  Laterality: Bilateral;  will need Guardant [id number]  ? ? ?REVIEW OF SYSTEMS:  Constitutional: positive for fatigue ?Eyes: negative ?Ears, nose, mouth, throat, and face: negative ?Respiratory: positive for dyspnea on exertion ?Cardiovascular: positive for tachypnea ?Gastrointestinal: negative ?Genitourinary:negative ?Integument/breast: negative ?Hematologic/lymphatic: negative ?Musculoskeletal:negative ?Neurological: negative ?Behavioral/Psych: negative ?Endocrine: negative ?Allergic/Immunologic: negative  ? ?PHYSICAL EXAMINATION: General appearance: alert, cooperative, fatigued, and no distress ?Head: Normocephalic, without obvious abnormality, atraumatic ?Neck: no adenopathy, no JVD, supple, symmetrical, trachea midline, and thyroid not enlarged, symmetric, no tenderness/mass/nodules ?Lymph nodes: Cervical, supraclavicular,  and axillary nodes normal. ?Resp: clear to auscultation bilaterally ?Back: symmetric, no curvature. ROM normal. No CVA tenderness. ?Cardio: regular rate and rhythm, S1, S2 normal, no murmur, click, rub or gallop ?GI: soft, non-tender; bowel sounds normal; no masses,  no organomegaly ?Extremities: extremities normal, atraumatic, no cyanosis or edema ?Neurologic: Alert and oriented X 3, normal strength and tone. Normal symmetric reflexes. Normal coordination and gait ? ?ECOG PERFORMANCE STATUS: 1 - Symptomatic but completely ambulatory ? ?Blood pressure 110/82, pulse (!) 132, temperature (!) 97.4 ?F (36.3 ?C), temperature source Tympanic, resp. rate 18, weight 264 lb 6 oz (119.9 kg), SpO2 91 %. ? ?LABORATORY DATA: ?Lab Results  ?Component Value Date  ? WBC 8.5 09/20/2021  ? HGB 12.1 (L) 09/20/2021  ? HCT 36.3 (L) 09/20/2021  ? MCV 99.7 09/20/2021  ? PLT 276 09/20/2021  ? ? ?  Chemistry   ?   ?Component Value Date/Time  ? NA 139 09/20/2021 0818  ? K 3.8 09/20/2021 0818  ? CL 100 09/20/2021 0818  ? CO2 33 (H) 09/20/2021 0818  ? BUN 22 09/20/2021 0818  ? CREATININE 1.00 09/20/2021 0818  ?    ?Component Value Date/Time  ? CALCIUM 8.9 09/20/2021 0818  ? ALKPHOS 90 09/20/2021 0818  ? AST 16 09/20/2021 0818  ? ALT 20 09/20/2021 0818  ? BILITOT 0.7 09/20/2021 0818  ?  ? ? ? ?  RADIOGRAPHIC STUDIES: ?No results found. ? ?ASSESSMENT AND PLAN: This is a very pleasant 66 years old white male with  stage IIIb (T3, N2, M0) non-small cell lung cancer, squamous cell carcinoma presented with large left upper lobe lung mass in addition to mediastinal lymphadenopathy diagnosed in April 2023. ?The patient has a history of colon cancer treated at [facility] in 2014 status post surgical resection followed by adjuvant systemic chemotherapy. ?The patient is currently undergoing a course of concurrent chemoradiation with weekly carboplatin for AUC of 2 and paclitaxel 45 Mg/M2 status post 1 cycle.  He tolerated the first week of his  treatment with chemotherapy fairly well but he has some adverse effects from radiation. ?He was supposed to start cycle #2 today but he was tachycardic.   ?Repeat EKG in the clinic showed atrial flutt

## 2021-09-20 NOTE — Patient Instructions (Signed)
Steps to Quit Smoking Smoking tobacco is the leading cause of preventable death. It can affect almost every organ in the body. Smoking puts you and people around you at risk for many serious, long-lasting (chronic) diseases. Quitting smoking can be hard, but it is one of the best things that you can do for your health. It is never too late to quit. Do not give up if you cannot quit the first time. Some people need to try many times to quit. Do your best to stick to your quit plan, and talk with your doctor if you have any questions or concerns. How do I get ready to quit? Pick a date to quit. Set a date within the next 2 weeks to give you time to prepare. Write down the reasons why you are quitting. Keep this list in places where you will see it often. Tell your family, friends, and co-workers that you are quitting. Their support is important. Talk with your doctor about the choices that may help you quit. Find out if your health insurance will pay for these treatments. Know the people, places, things, and activities that make you want to smoke (triggers). Avoid them. What first steps can I take to quit smoking? Throw away all cigarettes at home, at work, and in your car. Throw away the things that you use when you smoke, such as ashtrays and lighters. Clean your car. Empty the ashtray. Clean your home, including curtains and carpets. What can I do to help me quit smoking? Talk with your doctor about taking medicines and seeing a counselor. You are more likely to succeed when you do both. If you are pregnant or breastfeeding: Talk with your doctor about counseling or other ways to quit smoking. Do not take medicine to help you quit smoking unless your doctor tells you to. Quit right away Quit smoking completely, instead of slowly cutting back on how much you smoke over a period of time. Stopping smoking right away may be more successful than slowly quitting. Go to counseling. In-person is best  if this is an option. You are more likely to quit if you go to counseling sessions regularly. Take medicine You may take medicines to help you quit. Some medicines need a prescription, and some you can buy over-the-counter. Some medicines may contain a drug called nicotine to replace the nicotine in cigarettes. Medicines may: Help you stop having the desire to smoke (cravings). Help to stop the problems that come when you stop smoking (withdrawal symptoms). Your doctor may ask you to use: Nicotine patches, gum, or lozenges. Nicotine inhalers or sprays. Non-nicotine medicine that you take by mouth. Find resources Find resources and other ways to help you quit smoking and remain smoke-free after you quit. They include: Online chats with a counselor. Phone quitlines. Printed self-help materials. Support groups or group counseling. Text messaging programs. Mobile phone apps. Use apps on your mobile phone or tablet that can help you stick to your quit plan. Examples of free services include Quit Guide from the CDC and smokefree.gov  What can I do to make it easier to quit?  Talk to your family and friends. Ask them to support and encourage you. Call a phone quitline, such as 1-800-QUIT-NOW, reach out to support groups, or work with a counselor. Ask people who smoke to not smoke around you. Avoid places that make you want to smoke, such as: Bars. Parties. Smoke-break areas at work. Spend time with people who do not smoke. Lower   the stress in your life. Stress can make you want to smoke. Try these things to lower stress: Getting regular exercise. Doing deep-breathing exercises. Doing yoga. Meditating. What benefits will I see if I quit smoking? Over time, you may have: A better sense of smell and taste. Less coughing and sore throat. A slower heart rate. Lower blood pressure. Clearer skin. Better breathing. Fewer sick days. Summary Quitting smoking can be hard, but it is one of  the best things that you can do for your health. Do not give up if you cannot quit the first time. Some people need to try many times to quit. When you decide to quit smoking, make a plan to help you succeed. Quit smoking right away, not slowly over a period of time. When you start quitting, get help and support to keep you smoke-free. This information is not intended to replace advice given to you by your health care provider. Make sure you discuss any questions you have with your health care provider. Document Revised: 04/30/2021 Document Reviewed: 04/30/2021 Elsevier Patient Education  2023 Elsevier Inc.  

## 2021-09-20 NOTE — ED Notes (Signed)
Cardioversion:  ?Timeout with Melina Copa, MD at 1330 ?Propofol given at 1332 ?120J shock delivered at 1333 ?Repeat EKG done at 1335 ?

## 2021-09-20 NOTE — ED Triage Notes (Signed)
Pt brought over from [facility] with HR of 130's showing A-flutter, has history of one other event. Asymptomatic ?

## 2021-09-21 ENCOUNTER — Ambulatory Visit
Admission: RE | Admit: 2021-09-21 | Discharge: 2021-09-21 | Disposition: A | Discharge status: Home / Self Care | Payer: Medicare HMO | Source: Ambulatory Visit | Attending: Radiation Oncology | Admitting: Radiation Oncology

## 2021-09-21 ENCOUNTER — Other Ambulatory Visit: Payer: Self-pay

## 2021-09-21 DIAGNOSIS — R69 Illness, unspecified: Secondary | ICD-10-CM | POA: Diagnosis not present

## 2021-09-21 DIAGNOSIS — C3492 Malignant neoplasm of unspecified part of left bronchus or lung: Secondary | ICD-10-CM | POA: Diagnosis not present

## 2021-09-21 DIAGNOSIS — Z510 Encounter for antineoplastic radiation therapy: Secondary | ICD-10-CM | POA: Diagnosis not present

## 2021-09-21 DIAGNOSIS — C3412 Malignant neoplasm of upper lobe, left bronchus or lung: Secondary | ICD-10-CM | POA: Diagnosis not present

## 2021-09-21 LAB — RAD ONC ARIA SESSION SUMMARY
Course Elapsed Days: 8
Plan Fractions Treated to Date: 6
Plan Prescribed Dose Per Fraction: 2 Gy
Plan Total Fractions Prescribed: 30
Plan Total Prescribed Dose: 60 Gy
Reference Point Dosage Given to Date: 12 Gy
Reference Point Session Dosage Given: 2 Gy
Session Number: 6

## 2021-09-22 ENCOUNTER — Other Ambulatory Visit: Payer: Self-pay

## 2021-09-22 ENCOUNTER — Ambulatory Visit (HOSPITAL_COMMUNITY)
Admission: RE | Admit: 2021-09-22 | Discharge: 2021-09-22 | Disposition: A | Discharge status: Home / Self Care | Payer: Medicare HMO | Source: Ambulatory Visit | Attending: Internal Medicine | Admitting: Internal Medicine

## 2021-09-22 ENCOUNTER — Ambulatory Visit
Admission: RE | Admit: 2021-09-22 | Discharge: 2021-09-22 | Disposition: A | Discharge status: Home / Self Care | Payer: Medicare HMO | Source: Ambulatory Visit | Attending: Radiation Oncology | Admitting: Radiation Oncology

## 2021-09-22 ENCOUNTER — Telehealth: Payer: Self-pay

## 2021-09-22 DIAGNOSIS — C3490 Malignant neoplasm of unspecified part of unspecified bronchus or lung: Secondary | ICD-10-CM | POA: Insufficient documentation

## 2021-09-22 DIAGNOSIS — J3489 Other specified disorders of nose and nasal sinuses: Secondary | ICD-10-CM | POA: Diagnosis not present

## 2021-09-22 DIAGNOSIS — C3412 Malignant neoplasm of upper lobe, left bronchus or lung: Secondary | ICD-10-CM | POA: Diagnosis not present

## 2021-09-22 DIAGNOSIS — R69 Illness, unspecified: Secondary | ICD-10-CM | POA: Diagnosis not present

## 2021-09-22 DIAGNOSIS — I639 Cerebral infarction, unspecified: Secondary | ICD-10-CM | POA: Diagnosis not present

## 2021-09-22 DIAGNOSIS — C3492 Malignant neoplasm of unspecified part of left bronchus or lung: Secondary | ICD-10-CM | POA: Diagnosis not present

## 2021-09-22 DIAGNOSIS — Z510 Encounter for antineoplastic radiation therapy: Secondary | ICD-10-CM | POA: Diagnosis not present

## 2021-09-22 LAB — RAD ONC ARIA SESSION SUMMARY
Course Elapsed Days: 9
Plan Fractions Treated to Date: 7
Plan Prescribed Dose Per Fraction: 2 Gy
Plan Total Fractions Prescribed: 30
Plan Total Prescribed Dose: 60 Gy
Reference Point Dosage Given to Date: 14 Gy
Reference Point Session Dosage Given: 2 Gy
Session Number: 7

## 2021-09-22 MED ORDER — GADOBUTROL 1 MMOL/ML IV SOLN
10.0000 mL | Freq: Once | INTRAVENOUS | Status: AC | PRN
  Administered 2021-09-22: 10 mL via INTRAVENOUS

## 2021-09-22 NOTE — Telephone Encounter (Signed)
[facility] called report for MRI Brain highlighting: ? ?2. Small focus of enhancement in the right occipital lobe, which could represent a small vessel or a small metastasis. Recommend ?attention on the recommended follow-up. ?

## 2021-09-23 ENCOUNTER — Telehealth: Payer: Self-pay | Admitting: Internal Medicine

## 2021-09-23 ENCOUNTER — Other Ambulatory Visit: Payer: Self-pay

## 2021-09-23 ENCOUNTER — Ambulatory Visit
Admission: RE | Admit: 2021-09-23 | Discharge: 2021-09-23 | Disposition: A | Discharge status: Home / Self Care | Payer: Medicare HMO | Source: Ambulatory Visit | Attending: Radiation Oncology | Admitting: Radiation Oncology

## 2021-09-23 ENCOUNTER — Other Ambulatory Visit: Payer: Self-pay | Admitting: Radiation Therapy

## 2021-09-23 DIAGNOSIS — Z510 Encounter for antineoplastic radiation therapy: Secondary | ICD-10-CM | POA: Diagnosis not present

## 2021-09-23 DIAGNOSIS — R69 Illness, unspecified: Secondary | ICD-10-CM | POA: Diagnosis not present

## 2021-09-23 DIAGNOSIS — C3412 Malignant neoplasm of upper lobe, left bronchus or lung: Secondary | ICD-10-CM | POA: Diagnosis not present

## 2021-09-23 DIAGNOSIS — C3492 Malignant neoplasm of unspecified part of left bronchus or lung: Secondary | ICD-10-CM | POA: Diagnosis not present

## 2021-09-23 LAB — RAD ONC ARIA SESSION SUMMARY
Course Elapsed Days: 10
Plan Fractions Treated to Date: 8
Plan Prescribed Dose Per Fraction: 2 Gy
Plan Total Fractions Prescribed: 30
Plan Total Prescribed Dose: 60 Gy
Reference Point Dosage Given to Date: 16 Gy
Reference Point Session Dosage Given: 2 Gy
Session Number: 8

## 2021-09-23 NOTE — Telephone Encounter (Signed)
Called patient regarding upcoming appointments, patient is notified. 

## 2021-09-24 ENCOUNTER — Ambulatory Visit
Admission: RE | Admit: 2021-09-24 | Discharge: 2021-09-24 | Disposition: A | Discharge status: Home / Self Care | Payer: Medicare HMO | Source: Ambulatory Visit | Attending: Radiation Oncology | Admitting: Radiation Oncology

## 2021-09-24 ENCOUNTER — Other Ambulatory Visit: Payer: Self-pay

## 2021-09-24 DIAGNOSIS — C3492 Malignant neoplasm of unspecified part of left bronchus or lung: Secondary | ICD-10-CM | POA: Diagnosis not present

## 2021-09-24 DIAGNOSIS — Z510 Encounter for antineoplastic radiation therapy: Secondary | ICD-10-CM | POA: Diagnosis not present

## 2021-09-24 DIAGNOSIS — R69 Illness, unspecified: Secondary | ICD-10-CM | POA: Diagnosis not present

## 2021-09-24 DIAGNOSIS — C3412 Malignant neoplasm of upper lobe, left bronchus or lung: Secondary | ICD-10-CM | POA: Diagnosis not present

## 2021-09-24 LAB — RAD ONC ARIA SESSION SUMMARY
Course Elapsed Days: 11
Plan Fractions Treated to Date: 9
Plan Prescribed Dose Per Fraction: 2 Gy
Plan Total Fractions Prescribed: 30
Plan Total Prescribed Dose: 60 Gy
Reference Point Dosage Given to Date: 18 Gy
Reference Point Session Dosage Given: 2 Gy
Session Number: 9

## 2021-09-27 ENCOUNTER — Inpatient Hospital Stay: Payer: Medicare HMO

## 2021-09-27 ENCOUNTER — Telehealth: Payer: Self-pay | Admitting: Radiation Oncology

## 2021-09-27 ENCOUNTER — Other Ambulatory Visit: Payer: Self-pay | Admitting: Radiation Therapy

## 2021-09-27 ENCOUNTER — Inpatient Hospital Stay (HOSPITAL_BASED_OUTPATIENT_CLINIC_OR_DEPARTMENT_OTHER): Payer: Medicare HMO | Admitting: Physician Assistant

## 2021-09-27 ENCOUNTER — Other Ambulatory Visit: Payer: Self-pay

## 2021-09-27 ENCOUNTER — Ambulatory Visit
Admission: RE | Admit: 2021-09-27 | Discharge: 2021-09-27 | Disposition: A | Discharge status: Home / Self Care | Payer: Medicare HMO | Source: Ambulatory Visit | Attending: Radiation Oncology | Admitting: Radiation Oncology

## 2021-09-27 VITALS — BP 128/90 | HR 79 | Temp 97.8°F | Resp 20 | Ht 72.0 in | Wt 267.5 lb

## 2021-09-27 DIAGNOSIS — C3412 Malignant neoplasm of upper lobe, left bronchus or lung: Secondary | ICD-10-CM | POA: Diagnosis not present

## 2021-09-27 DIAGNOSIS — T50905A Adverse effect of unspecified drugs, medicaments and biological substances, initial encounter: Secondary | ICD-10-CM | POA: Diagnosis not present

## 2021-09-27 DIAGNOSIS — Z79899 Other long term (current) drug therapy: Secondary | ICD-10-CM | POA: Diagnosis not present

## 2021-09-27 DIAGNOSIS — Z510 Encounter for antineoplastic radiation therapy: Secondary | ICD-10-CM | POA: Diagnosis not present

## 2021-09-27 DIAGNOSIS — C7931 Secondary malignant neoplasm of brain: Secondary | ICD-10-CM

## 2021-09-27 DIAGNOSIS — Z5111 Encounter for antineoplastic chemotherapy: Secondary | ICD-10-CM | POA: Diagnosis present

## 2021-09-27 DIAGNOSIS — Z452 Encounter for adjustment and management of vascular access device: Secondary | ICD-10-CM | POA: Diagnosis not present

## 2021-09-27 DIAGNOSIS — C3492 Malignant neoplasm of unspecified part of left bronchus or lung: Secondary | ICD-10-CM

## 2021-09-27 DIAGNOSIS — R69 Illness, unspecified: Secondary | ICD-10-CM | POA: Diagnosis not present

## 2021-09-27 LAB — CBC WITH DIFFERENTIAL (CANCER CENTER ONLY)
Abs Immature Granulocytes: 0.03 10*3/uL (ref 0.00–0.07)
Basophils Absolute: 0 10*3/uL (ref 0.0–0.1)
Basophils Relative: 0 %
Eosinophils Absolute: 0 10*3/uL (ref 0.0–0.5)
Eosinophils Relative: 0 %
HCT: 39 % (ref 39.0–52.0)
Hemoglobin: 12.6 g/dL — ABNORMAL LOW (ref 13.0–17.0)
Immature Granulocytes: 1 %
Lymphocytes Relative: 22 %
Lymphs Abs: 1.3 10*3/uL (ref 0.7–4.0)
MCH: 32.9 pg (ref 26.0–34.0)
MCHC: 32.3 g/dL (ref 30.0–36.0)
MCV: 101.8 fL — ABNORMAL HIGH (ref 80.0–100.0)
Monocytes Absolute: 0.9 10*3/uL (ref 0.1–1.0)
Monocytes Relative: 16 %
Neutro Abs: 3.5 10*3/uL (ref 1.7–7.7)
Neutrophils Relative %: 61 %
Platelet Count: 198 10*3/uL (ref 150–400)
RBC: 3.83 MIL/uL — ABNORMAL LOW (ref 4.22–5.81)
RDW: 15 % (ref 11.5–15.5)
WBC Count: 5.8 10*3/uL (ref 4.0–10.5)
nRBC: 0 % (ref 0.0–0.2)

## 2021-09-27 LAB — CMP (CANCER CENTER ONLY)
ALT: 20 U/L (ref 0–44)
AST: 18 U/L (ref 15–41)
Albumin: 3.7 g/dL (ref 3.5–5.0)
Alkaline Phosphatase: 105 U/L (ref 38–126)
Anion gap: 5 (ref 5–15)
BUN: 14 mg/dL (ref 8–23)
CO2: 37 mmol/L — ABNORMAL HIGH (ref 22–32)
Calcium: 9.4 mg/dL (ref 8.9–10.3)
Chloride: 97 mmol/L — ABNORMAL LOW (ref 98–111)
Creatinine: 1.02 mg/dL (ref 0.61–1.24)
GFR, Estimated: >60 mL/min (ref >60)
Glucose, Bld: 99 mg/dL (ref 70–99)
Potassium: 4.6 mmol/L (ref 3.5–5.1)
Sodium: 139 mmol/L (ref 135–145)
Total Bilirubin: 0.7 mg/dL (ref 0.3–1.2)
Total Protein: 7.3 g/dL (ref 6.5–8.1)

## 2021-09-27 LAB — RAD ONC ARIA SESSION SUMMARY
Course Elapsed Days: 14
Plan Fractions Treated to Date: 10
Plan Prescribed Dose Per Fraction: 2 Gy
Plan Total Fractions Prescribed: 30
Plan Total Prescribed Dose: 60 Gy
Reference Point Dosage Given to Date: 20 Gy
Reference Point Session Dosage Given: 2 Gy
Session Number: 10

## 2021-09-27 MED ORDER — ALBUTEROL SULFATE HFA 108 (90 BASE) MCG/ACT IN AERS
2.0000 | INHALATION_SPRAY | Freq: Once | RESPIRATORY_TRACT | Status: AC
  Administered 2021-09-27: 2 via RESPIRATORY_TRACT

## 2021-09-27 MED ORDER — FAMOTIDINE IN NACL 20-0.9 MG/50ML-% IV SOLN
20.0000 mg | Freq: Once | INTRAVENOUS | Status: AC
  Administered 2021-09-27: 20 mg via INTRAVENOUS
  Filled 2021-09-27: qty 50

## 2021-09-27 MED ORDER — SODIUM CHLORIDE 0.9 % IV SOLN: Freq: Once | INTRAVENOUS | Status: DC | PRN

## 2021-09-27 MED ORDER — SODIUM CHLORIDE 0.9 % IV SOLN
10.0000 mg | Freq: Once | INTRAVENOUS | Status: AC
  Administered 2021-09-27: 10 mg via INTRAVENOUS
  Filled 2021-09-27: qty 10

## 2021-09-27 MED ORDER — SODIUM CHLORIDE 0.9 % IV SOLN
284.8000 mg | Freq: Once | INTRAVENOUS | Status: AC
  Administered 2021-09-27: 280 mg via INTRAVENOUS
  Filled 2021-09-27: qty 28

## 2021-09-27 MED ORDER — METHYLPREDNISOLONE SODIUM SUCC 125 MG IJ SOLR
125.0000 mg | Freq: Once | INTRAMUSCULAR | Status: AC | PRN
  Administered 2021-09-27: 125 mg via INTRAVENOUS

## 2021-09-27 MED ORDER — SODIUM CHLORIDE 0.9 % IV SOLN: Freq: Once | INTRAVENOUS | Status: AC

## 2021-09-27 MED ORDER — DIPHENHYDRAMINE HCL 50 MG/ML IJ SOLN
50.0000 mg | Freq: Once | INTRAMUSCULAR | Status: AC
  Administered 2021-09-27: 50 mg via INTRAVENOUS
  Filled 2021-09-27: qty 1

## 2021-09-27 MED ORDER — PALONOSETRON HCL INJECTION 0.25 MG/5ML
0.2500 mg | Freq: Once | INTRAVENOUS | Status: AC
  Administered 2021-09-27: 0.25 mg via INTRAVENOUS
  Filled 2021-09-27: qty 5

## 2021-09-27 MED ORDER — SODIUM CHLORIDE 0.9 % IV SOLN
45.0000 mg/m2 | Freq: Once | INTRAVENOUS | Status: AC
  Administered 2021-09-27: 114 mg via INTRAVENOUS
  Filled 2021-09-27: qty 19

## 2021-09-27 MED ORDER — ALBUTEROL SULFATE (2.5 MG/3ML) 0.083% IN NEBU: 2.5000 mg | INHALATION_SOLUTION | Freq: Once | RESPIRATORY_TRACT | Status: DC | PRN

## 2021-09-27 NOTE — Progress Notes (Signed)
?  ?DATE:  09/27/21 ? ? ?                                     ?X CHEMO/IMMUNOTHERAPY REACTION        ?  ?  ?MD: Julien Nordmann ?  ?AGENT/BLOOD PRODUCT RECEIVING TODAY:              taxol and carbo ?  ?AGENT/BLOOD PRODUCT RECEIVING IMMEDIATELY PRIOR TO REACTION:          taxol ?  ?VS: ?BP:     135/81   P:       81       SPO2:       90% 4L                ?BP:     132/50   P:       83       SPO2:       94 2L   ?  ?REACTION(S):           shortness of breath ?  ?PREMEDS:     decadron 10 mg IV, benadryl 50 mg IV, aloxi 0.25 mg IV, pepcid 20 mg IV ?  ?INTERVENTION: Solumedrol 125 mg IV, albuterol inhaler 2 puffs ?  ?Review of Systems  ?Review of Systems  ?Respiratory:  Positive for shortness of breath and wheezing.   ?All other systems reviewed and are negative.  ? ?Physical Exam  ?Physical Exam ?Vitals and nursing note reviewed.  ?Constitutional:   ?   Appearance: He is well-developed. He is not ill-appearing or toxic-appearing.  ?HENT:  ?   Head: Normocephalic and atraumatic.  ?   Nose: Nose normal.  ?Eyes:  ?   General: No scleral icterus.    ?   Right eye: No discharge.     ?   Left eye: No discharge.  ?   Conjunctiva/sclera: Conjunctivae normal.  ?Neck:  ?   Vascular: No JVD.  ?Cardiovascular:  ?   Rate and Rhythm: Normal rate and regular rhythm.  ?   Pulses: Normal pulses.  ?   Heart sounds: Normal heart sounds.  ?Pulmonary:  ?   Effort: Pulmonary effort is normal.  ?   Breath sounds: Normal breath sounds.  ?   Comments: Wheezing heard in all lung fields. Speaking in short sentences ?Abdominal:  ?   General: There is no distension.  ?Musculoskeletal:     ?   General: Normal range of motion.  ?   Cervical back: Normal range of motion.  ?Skin: ?   General: Skin is warm and dry.  ?   Capillary Refill: Capillary refill takes less than 2 seconds.  ?Neurological:  ?   Mental Status: He is oriented to person, place, and time.  ?   GCS: GCS eye subscore is 4. GCS verbal subscore is 5. GCS motor subscore is 6.  ?   Comments: Fluent  speech, no facial droop.  ?Psychiatric:     ?   Behavior: Behavior normal.  ? ? ?OUTCOME:        Patient has history of COPD and home O2 for prn use. He returned to baseline after interventions and Taxol was restarted at half rate. Patient reported feeling at baseline and tolerated remainder of treatment without adverse reactions. His oxygen usually ranges from 90-93% he reports. He was asymptomatic at time of discharge.  Dr. Julien Nordmann agreeable with plan of care.      ? ?

## 2021-09-27 NOTE — Patient Instructions (Signed)
[facility]  Discharge Instructions: Thank you for choosing [facility] to provide your oncology and hematology care.   If you have a lab appointment with the Cancer Center, please go directly to the Cancer Center and check in at the registration area.   Wear comfortable clothing and clothing appropriate for easy access to any Portacath or PICC line.   We strive to give you quality time with your provider. You may need to reschedule your appointment if you arrive late (15 or more minutes).  Arriving late affects you and other patients whose appointments are after yours.  Also, if you miss three or more appointments without notifying the office, you may be dismissed from the clinic at the provider's discretion.      For prescription refill requests, have your pharmacy contact our office and allow 72 hours for refills to be completed.    Today you received the following chemotherapy and/or immunotherapy agents: Paclitaxel (Taxol) and Carboplatin.   To help prevent nausea and vomiting after your treatment, we encourage you to take your nausea medication as directed.  BELOW ARE SYMPTOMS THAT SHOULD BE REPORTED IMMEDIATELY: *FEVER GREATER THAN 100.4 F (38 C) OR HIGHER *CHILLS OR SWEATING *NAUSEA AND VOMITING THAT IS NOT CONTROLLED WITH YOUR NAUSEA MEDICATION *UNUSUAL SHORTNESS OF BREATH *UNUSUAL BRUISING OR BLEEDING *URINARY PROBLEMS (pain or burning when urinating, or frequent urination) *BOWEL PROBLEMS (unusual diarrhea, constipation, pain near the anus) TENDERNESS IN MOUTH AND THROAT WITH OR WITHOUT PRESENCE OF ULCERS (sore throat, sores in mouth, or a toothache) UNUSUAL RASH, SWELLING OR PAIN  UNUSUAL VAGINAL DISCHARGE OR ITCHING   Items with * indicate a potential emergency and should be followed up as soon as possible or go to the Emergency Department if any problems should occur.  Please show the CHEMOTHERAPY ALERT CARD or IMMUNOTHERAPY  ALERT CARD at check-in to the Emergency Department and triage nurse.  Should you have questions after your visit or need to cancel or reschedule your appointment, please contact [facility]  Dept: [phone]  and follow the prompts.  Office hours are 8:00 a.m. to 4:30 p.m. Monday - Friday. Please note that voicemails left after 4:00 p.m. may not be returned until the following business day.  We are closed weekends and major holidays. You have access to a nurse at all times for urgent questions. Please call the main number to the clinic Dept: [phone] and follow the prompts.   For any non-urgent questions, you may also contact your provider using MyChart. We now offer e-Visits for anyone 18 and older to request care online for non-urgent symptoms. For details visit [email or web address].   Also download the MyChart app! Go to the app store, search "MyChart", open the app, select [facility], and log in with your MyChart username and password.  Due to Covid, a mask is required upon entering the hospital/clinic. If you do not have a mask, one will be given to you upon arrival. For doctor visits, patients may have 1 support person aged 18 or older with them. For treatment visits, patients cannot have anyone with them due to current Covid guidelines and our immunocompromised population.   

## 2021-09-27 NOTE — Progress Notes (Signed)
Paclitaxel restarted at a rate of 16ml/hr per Eloise Harman, PA. ?

## 2021-09-27 NOTE — Progress Notes (Signed)
Hypersensitivity Reaction note ? ?Date of event: 09/27/21 ?Time of event: 1422 ?Generic name of drug involved: Paclitaxol ?Name of provider notified of the hypersensitivity reaction: Kaitlyn E. Walisiewicz PA.  ?Was agent that likely caused hypersensitivity reaction added to Allergies List within EMR?  ?Yes. ?Chain of events including reaction signs/symptoms, treatment administered, and outcome:  ? ?Pt observed by RN to be exhibiting markedly labored breathing after drug initiated by [employer]. Pt unable to express any other symptoms to RN. Drug paused. RN administered Oxygen by [place] at 3L. Pt observed to be satting 60% with a HR of 140. PA Verline Lema was nearby and was called to assist. 1L fluid administered wide open. 125mg  Solu-Medrol administered per [facility]. Pt took two puffs of home albuterol inhaler per PA due to continued SOB. [place] increased briefly to 6L per PA. Pt saturation recovered to 100%. Pt began reporting resolution of symptoms. Pt slowly titrated down to 1L as tolerated. PA advised nursing staff to re-try drug at 1/2 previous rate. Taxol resumed at 163ml/hr. Pt tolerated infusion. ? ?Rafael Bihari, RN ?09/27/2021 2:56 PM ? ?

## 2021-09-27 NOTE — Telephone Encounter (Signed)
I called the paitent to let him know we reviewed his MRI for screening purposes due to his history of stage III lung cancer. There are two lesions that are difficult to characterize. Review of his imaging and discussion in brain oncology conferene was to repeat his scan in about 1 month  with 3T MRI protocol. Our brain oncology navigator Mont Dutton, RT will be in touch with the patient as well to schedule a follow up. If this is felt to be disease on subsequent scans, we would consider SRS treatment. He and his wife agree he is asymptomatic neurologically and would agree to the repeat scan. He will otherwise continue his chemoRT treatment as planned.  ?

## 2021-09-28 ENCOUNTER — Other Ambulatory Visit: Payer: Self-pay

## 2021-09-28 ENCOUNTER — Ambulatory Visit
Admission: RE | Admit: 2021-09-28 | Discharge: 2021-09-28 | Disposition: A | Discharge status: Home / Self Care | Payer: Medicare HMO | Source: Ambulatory Visit | Attending: Radiation Oncology | Admitting: Radiation Oncology

## 2021-09-28 DIAGNOSIS — C3412 Malignant neoplasm of upper lobe, left bronchus or lung: Secondary | ICD-10-CM | POA: Diagnosis not present

## 2021-09-28 DIAGNOSIS — C3492 Malignant neoplasm of unspecified part of left bronchus or lung: Secondary | ICD-10-CM | POA: Diagnosis not present

## 2021-09-28 DIAGNOSIS — R69 Illness, unspecified: Secondary | ICD-10-CM | POA: Diagnosis not present

## 2021-09-28 DIAGNOSIS — Z510 Encounter for antineoplastic radiation therapy: Secondary | ICD-10-CM | POA: Diagnosis not present

## 2021-09-28 LAB — RAD ONC ARIA SESSION SUMMARY
Course Elapsed Days: 15
Plan Fractions Treated to Date: 11
Plan Prescribed Dose Per Fraction: 2 Gy
Plan Total Fractions Prescribed: 30
Plan Total Prescribed Dose: 60 Gy
Reference Point Dosage Given to Date: 22 Gy
Reference Point Session Dosage Given: 2 Gy
Session Number: 11

## 2021-09-29 ENCOUNTER — Other Ambulatory Visit: Payer: Self-pay

## 2021-09-29 ENCOUNTER — Ambulatory Visit
Admission: RE | Admit: 2021-09-29 | Discharge: 2021-09-29 | Disposition: A | Discharge status: Home / Self Care | Payer: Medicare HMO | Source: Ambulatory Visit | Attending: Radiation Oncology | Admitting: Radiation Oncology

## 2021-09-29 ENCOUNTER — Telehealth: Payer: Self-pay | Admitting: Radiation Therapy

## 2021-09-29 DIAGNOSIS — R69 Illness, unspecified: Secondary | ICD-10-CM | POA: Diagnosis not present

## 2021-09-29 DIAGNOSIS — Z510 Encounter for antineoplastic radiation therapy: Secondary | ICD-10-CM | POA: Diagnosis not present

## 2021-09-29 DIAGNOSIS — C3412 Malignant neoplasm of upper lobe, left bronchus or lung: Secondary | ICD-10-CM | POA: Diagnosis not present

## 2021-09-29 DIAGNOSIS — C3492 Malignant neoplasm of unspecified part of left bronchus or lung: Secondary | ICD-10-CM | POA: Diagnosis not present

## 2021-09-29 LAB — RAD ONC ARIA SESSION SUMMARY
Course Elapsed Days: 16
Plan Fractions Treated to Date: 12
Plan Prescribed Dose Per Fraction: 2 Gy
Plan Total Fractions Prescribed: 30
Plan Total Prescribed Dose: 60 Gy
Reference Point Dosage Given to Date: 24 Gy
Reference Point Session Dosage Given: 2 Gy
Session Number: 12

## 2021-09-29 NOTE — Telephone Encounter (Signed)
Spoke with Mr. [name] about the June brain MRI that has been scheduled for him. He was already aware from [name] and happy for the call.  ? ?Mont Dutton R.T.(R)(T) ?Radiation Special Procedures Navigator  ?

## 2021-09-30 ENCOUNTER — Ambulatory Visit
Admission: RE | Admit: 2021-09-30 | Discharge: 2021-09-30 | Disposition: A | Discharge status: Home / Self Care | Payer: Medicare HMO | Source: Ambulatory Visit | Attending: Radiation Oncology | Admitting: Radiation Oncology

## 2021-09-30 ENCOUNTER — Other Ambulatory Visit: Payer: Self-pay

## 2021-09-30 DIAGNOSIS — Z510 Encounter for antineoplastic radiation therapy: Secondary | ICD-10-CM | POA: Diagnosis not present

## 2021-09-30 DIAGNOSIS — C3412 Malignant neoplasm of upper lobe, left bronchus or lung: Secondary | ICD-10-CM | POA: Diagnosis not present

## 2021-09-30 DIAGNOSIS — R69 Illness, unspecified: Secondary | ICD-10-CM | POA: Diagnosis not present

## 2021-09-30 DIAGNOSIS — C3492 Malignant neoplasm of unspecified part of left bronchus or lung: Secondary | ICD-10-CM | POA: Diagnosis not present

## 2021-09-30 LAB — RAD ONC ARIA SESSION SUMMARY
Course Elapsed Days: 17
Plan Fractions Treated to Date: 13
Plan Prescribed Dose Per Fraction: 2 Gy
Plan Total Fractions Prescribed: 30
Plan Total Prescribed Dose: 60 Gy
Reference Point Dosage Given to Date: 26 Gy
Reference Point Session Dosage Given: 2 Gy
Session Number: 13

## 2021-10-01 ENCOUNTER — Other Ambulatory Visit: Payer: Self-pay

## 2021-10-01 ENCOUNTER — Ambulatory Visit
Admission: RE | Admit: 2021-10-01 | Discharge: 2021-10-01 | Disposition: A | Discharge status: Home / Self Care | Payer: Medicare HMO | Source: Ambulatory Visit | Attending: Radiation Oncology | Admitting: Radiation Oncology

## 2021-10-01 DIAGNOSIS — R69 Illness, unspecified: Secondary | ICD-10-CM | POA: Diagnosis not present

## 2021-10-01 DIAGNOSIS — C3492 Malignant neoplasm of unspecified part of left bronchus or lung: Secondary | ICD-10-CM | POA: Diagnosis not present

## 2021-10-01 DIAGNOSIS — C3412 Malignant neoplasm of upper lobe, left bronchus or lung: Secondary | ICD-10-CM | POA: Diagnosis not present

## 2021-10-01 DIAGNOSIS — Z510 Encounter for antineoplastic radiation therapy: Secondary | ICD-10-CM | POA: Diagnosis not present

## 2021-10-01 LAB — RAD ONC ARIA SESSION SUMMARY
Course Elapsed Days: 18
Plan Fractions Treated to Date: 14
Plan Prescribed Dose Per Fraction: 2 Gy
Plan Total Fractions Prescribed: 30
Plan Total Prescribed Dose: 60 Gy
Reference Point Dosage Given to Date: 28 Gy
Reference Point Session Dosage Given: 2 Gy
Session Number: 14

## 2021-10-01 MED FILL — Dexamethasone Sodium Phosphate Inj 100 MG/10ML: INTRAMUSCULAR | Qty: 1 | Status: AC

## 2021-10-01 NOTE — Progress Notes (Signed)
[name] ?OFFICE PROGRESS NOTE ? ?[name] Samples, PA-C ?[address] ?[place] [address] ? ?DIAGNOSIS: Stage IIIb (T3, N2, M0) non-small cell lung cancer, squamous cell carcinoma presented with large left upper lobe lung mass in addition to mediastinal lymphadenopathy diagnosed in April 2023. ?The patient has a history of colon cancer treated at [facility] in 2014 status post surgical resection followed by adjuvant systemic chemotherapy. ? ?PRIOR THERAPY: None ? ?CURRENT THERAPY: Concurrent chemoradiation with weekly carboplatin for AUC of 2 and paclitaxel 45 Mg/M2.  Status post 3 cycles. ? ?INTERVAL HISTORY: ?[name] 66 y.o. male returns to the clinic today for a follow-up visit accompanied by his wife.  The patient was recently diagnosed with stage III lung cancer.  He is currently undergoing a course of concurrent chemoradiation.  He is status post 3 cycles and is tolerated fairly well except with his last infusion, he developed a reaction to Taxol which required additional albuterol and Solu-Medrol.  Going forward, he has premedications adjusted which contains solumedrol, aloxi, pepcid, and benadryl.   ? ?Otherwise the patient denies any new concerning complaints.  Denies any fever, chills, night sweats, or unexplained weight loss.  Denies any odynophagia or dysphagia.  Denies any nausea, vomiting, diarrhea, or constipation.  Denies any headache or visual changes. He denies hemoptysis or chest pain. He reports stable dyspnea with certain activities but reports it is stable/improved. He also reports his mild cough is a little better. He is here today for evaluation and repeat blood work before starting cycle #4.  His last day of radiation is tentatively scheduled for 10/29/21.  ? ? ?MEDICAL HISTORY: ?Past Medical History:  ?Diagnosis Date  ? Atrial flutter ([place])   ? s/p DCCV 06/03/21  ? CHF (congestive heart failure) ([place])   ? Colon cancer ([place]) 04/22/2013  ?  Hypertension   ? Lung cancer ([place]) 08/26/2021  ? Renal disorder   ? Kidney stones  ? ? ?ALLERGIES:  is allergic to paclitaxel and penicillins. ? ?MEDICATIONS:  ?Current Outpatient Medications  ?Medication Sig Dispense Refill  ? acetaminophen (TYLENOL) 325 MG tablet Take 2 tablets (650 mg total) by mouth every 6 (six) hours as needed for mild pain (or Fever >/= 101). 12 tablet 0  ? albuterol (PROVENTIL) (2.5 MG/3ML) 0.083% nebulizer solution Take 3 mLs by nebulization every 6 (six) hours as needed for wheezing or shortness of breath. (Patient not taking: Reported on 09/01/2021) 75 mL 12  ? albuterol (VENTOLIN HFA) 108 (90 Base) MCG/ACT inhaler Inhale 2 puffs into the lungs every 6 (six) hours as needed for wheezing or shortness of breath. (Patient not taking: Reported on 09/01/2021) 18 g 2  ? apixaban (ELIQUIS) 5 MG TABS tablet Take 1 tablet (5 mg total) by mouth 2 (two) times daily. 60 tablet 2  ? atorvastatin (LIPITOR) 20 MG tablet Take 1 tablet (20 mg total) by mouth every evening. 30 tablet 3  ? bisoprolol (ZEBETA) 5 MG tablet Take 1 tablet (5 mg total) by mouth daily. For BP and Heart Rate Control 30 tablet 5  ? Budeson-Glycopyrrol-Formoterol (BREZTRI AEROSPHERE) 160-9-4.8 MCG/ACT AERO Inhale 2 puffs into the lungs in the morning and at bedtime. 10.7 g 0  ? glimepiride (AMARYL) 2 MG tablet Take 1 tablet (2 mg total) by mouth every morning. 30 tablet 11  ? Multiple Vitamin (MULTI-VITAMIN DAILY PO) Take 1 tablet by mouth daily.    ? potassium chloride (KLOR-CON) 10 MEQ tablet Take 1 tablet (10 mEq  total) by mouth daily. Take While taking Lasix/furosemide 30 tablet 2  ? prochlorperazine (COMPAZINE) 10 MG tablet Take 1 tablet (10 mg total) by mouth every 6 (six) hours as needed for nausea or vomiting. 30 tablet 0  ? torsemide 40 MG TABS Take 40 mg by mouth every morning. 30 tablet 2  ? ?No current facility-administered medications for this visit.  ? ? ?SURGICAL HISTORY:  ?Past Surgical History:  ?Procedure Laterality  Date  ? BRONCHIAL BIOPSY  08/26/2021  ? Procedure: BRONCHIAL BIOPSIES;  Surgeon: Garner Nash, DO;  Location: [facility];  Service: Pulmonary;;  ? BRONCHIAL BRUSHINGS  08/26/2021  ? Procedure: BRONCHIAL BRUSHINGS;  Surgeon: Garner Nash, DO;  Location: [facility];  Service: Pulmonary;;  ? BRONCHIAL NEEDLE ASPIRATION BIOPSY  08/26/2021  ? Procedure: BRONCHIAL NEEDLE ASPIRATION BIOPSIES;  Surgeon: Garner Nash, DO;  Location: [facility];  Service: Pulmonary;;  ? CARDIOVERSION N/A 06/03/2021  ? Procedure: CARDIOVERSION;  Surgeon: Satira Sark, MD;  Location: [facility];  Service: Cardiovascular;  Laterality: N/A;  ? COLONOSCOPY N/A 04/29/2013  ? Procedure: COLONOSCOPY;  Surgeon: Danie Binder, MD;  [facility];  Service: Endoscopy;  Laterality: N/A;  10:30AM  ? Cyst removed from left elbow    ? ENDOVENOUS ABLATION SAPHENOUS VEIN W/ LASER Left 01/16/2019  ? endovenous laser ablation left greater saphenous vein by Ruta Hinds MD  ? KIDNEY STONE SURGERY    ? LITHOTRIPSY    ? PARTIAL COLECTOMY    ? TEE WITHOUT CARDIOVERSION N/A 06/03/2021  ? Procedure: TRANSESOPHAGEAL ECHOCARDIOGRAM (TEE);  Surgeon: Satira Sark, MD;  Location: [facility];  Service: Cardiovascular;  Laterality: N/A;  ? VIDEO BRONCHOSCOPY WITH ENDOBRONCHIAL ULTRASOUND Bilateral 08/26/2021  ? Procedure: VIDEO BRONCHOSCOPY WITH ENDOBRONCHIAL ULTRASOUND;  Surgeon: Garner Nash, DO;  Location: [facility];  Service: Pulmonary;  Laterality: Bilateral;  will need Guardant [id number]  ? ? ?REVIEW OF SYSTEMS:   ?Review of Systems  ?Constitutional: Negative for appetite change, chills, fatigue, fever and unexpected weight change.  ?HENT:   Negative for mouth sores, nosebleeds, sore throat and trouble swallowing.   ?Eyes: Negative for eye problems and icterus.  ?Respiratory: Positive for dyspnea on exertion (stable/improved). Positive for mild intermittent cough. Negative for  hemoptysis, and wheezing.   ?Cardiovascular: Negative for chest  pain and leg swelling.  ?Gastrointestinal: Negative for abdominal pain, constipation, diarrhea, nausea and vomiting.  ?Genitourinary: Negative for bladder incontinence, difficulty urinating, dysuria, frequency and hematuria.   ?Musculoskeletal: Negative for back pain, gait problem, neck pain and neck stiffness.  ?Skin: Negative for itching and rash.  ?Neurological: Negative for dizziness, extremity weakness, gait problem, headaches, light-headedness and seizures.  ?Hematological: Negative for adenopathy. Does not bruise/bleed easily.  ?Psychiatric/Behavioral: Negative for confusion, depression and sleep disturbance. The patient is not nervous/anxious.   ? ? ?PHYSICAL EXAMINATION:  ?Blood pressure 122/76, pulse 80, temperature 97.6 ?F (36.4 ?C), temperature source Tympanic, resp. rate 20, height 6' (1.829 m), weight 261 lb (118.4 kg), SpO2 91 %. ? ?ECOG PERFORMANCE STATUS: 1 ? ?Physical Exam  ?Constitutional: Oriented to person, place, and time and well-developed, well-nourished, and in no distress.  ?HENT:  ?Head: Normocephalic and atraumatic.  ?Mouth/Throat: Oropharynx is clear and moist. No oropharyngeal exudate.  ?Eyes: Conjunctivae are normal. Right eye exhibits no discharge. Left eye exhibits no discharge. No scleral icterus.  ?Neck: Normal range of motion. Neck supple.  ?Cardiovascular: Normal rate, regular rhythm, normal heart sounds and intact distal pulses.   ?Pulmonary/Chest: Effort normal  and breath sounds normal. No respiratory distress. No wheezes. No rales. Had mild rhonchi which dislodged with coughing.  ?Abdominal: Soft. Bowel sounds are normal. Exhibits no distension and no mass. There is no tenderness.  ?Musculoskeletal: Normal range of motion. Exhibits no edema.  ?Lymphadenopathy:  ?  No cervical adenopathy.  ?Neurological: Alert and oriented to person, place, and time. Exhibits normal muscle tone. Gait normal. Coordination normal.  ?Skin: Skin is warm and dry. No rash noted. Not diaphoretic. No  erythema. No pallor.  ?Psychiatric: Mood, memory and judgment normal.  ?Vitals reviewed. ? ?LABORATORY DATA: ?Lab Results  ?Component Value Date  ? WBC 7.8 10/04/2021  ? HGB 12.7 (L) 10/04/2021  ? HCT 3

## 2021-10-04 ENCOUNTER — Ambulatory Visit
Admission: RE | Admit: 2021-10-04 | Discharge: 2021-10-04 | Disposition: A | Discharge status: Home / Self Care | Payer: Medicare HMO | Source: Ambulatory Visit | Attending: Radiation Oncology | Admitting: Radiation Oncology

## 2021-10-04 ENCOUNTER — Inpatient Hospital Stay: Payer: Medicare HMO

## 2021-10-04 ENCOUNTER — Inpatient Hospital Stay (HOSPITAL_BASED_OUTPATIENT_CLINIC_OR_DEPARTMENT_OTHER): Payer: Medicare HMO | Admitting: Physician Assistant

## 2021-10-04 ENCOUNTER — Other Ambulatory Visit: Payer: Self-pay

## 2021-10-04 VITALS — BP 122/76 | HR 80 | Temp 97.6°F | Resp 20 | Ht 72.0 in | Wt 261.0 lb

## 2021-10-04 DIAGNOSIS — C3412 Malignant neoplasm of upper lobe, left bronchus or lung: Secondary | ICD-10-CM | POA: Diagnosis not present

## 2021-10-04 DIAGNOSIS — C3492 Malignant neoplasm of unspecified part of left bronchus or lung: Secondary | ICD-10-CM

## 2021-10-04 DIAGNOSIS — R69 Illness, unspecified: Secondary | ICD-10-CM | POA: Diagnosis not present

## 2021-10-04 DIAGNOSIS — Z452 Encounter for adjustment and management of vascular access device: Secondary | ICD-10-CM | POA: Diagnosis not present

## 2021-10-04 DIAGNOSIS — Z79899 Other long term (current) drug therapy: Secondary | ICD-10-CM | POA: Diagnosis not present

## 2021-10-04 DIAGNOSIS — Z510 Encounter for antineoplastic radiation therapy: Secondary | ICD-10-CM | POA: Diagnosis not present

## 2021-10-04 DIAGNOSIS — Z5111 Encounter for antineoplastic chemotherapy: Secondary | ICD-10-CM | POA: Diagnosis not present

## 2021-10-04 LAB — CBC WITH DIFFERENTIAL (CANCER CENTER ONLY)
Abs Immature Granulocytes: 0.04 10*3/uL (ref 0.00–0.07)
Basophils Absolute: 0 10*3/uL (ref 0.0–0.1)
Basophils Relative: 0 %
Eosinophils Absolute: 0 10*3/uL (ref 0.0–0.5)
Eosinophils Relative: 0 %
HCT: 37.7 % — ABNORMAL LOW (ref 39.0–52.0)
Hemoglobin: 12.7 g/dL — ABNORMAL LOW (ref 13.0–17.0)
Immature Granulocytes: 1 %
Lymphocytes Relative: 13 %
Lymphs Abs: 1 10*3/uL (ref 0.7–4.0)
MCH: 33.9 pg (ref 26.0–34.0)
MCHC: 33.7 g/dL (ref 30.0–36.0)
MCV: 100.5 fL — ABNORMAL HIGH (ref 80.0–100.0)
Monocytes Absolute: 0.5 10*3/uL (ref 0.1–1.0)
Monocytes Relative: 6 %
Neutro Abs: 6.2 10*3/uL (ref 1.7–7.7)
Neutrophils Relative %: 80 %
Platelet Count: 150 10*3/uL (ref 150–400)
RBC: 3.75 MIL/uL — ABNORMAL LOW (ref 4.22–5.81)
RDW: 14.6 % (ref 11.5–15.5)
WBC Count: 7.8 10*3/uL (ref 4.0–10.5)
nRBC: 0 % (ref 0.0–0.2)

## 2021-10-04 LAB — CMP (CANCER CENTER ONLY)
ALT: 24 U/L (ref 0–44)
AST: 21 U/L (ref 15–41)
Albumin: 3.7 g/dL (ref 3.5–5.0)
Alkaline Phosphatase: 82 U/L (ref 38–126)
Anion gap: 4 — ABNORMAL LOW (ref 5–15)
BUN: 18 mg/dL (ref 8–23)
CO2: 36 mmol/L — ABNORMAL HIGH (ref 22–32)
Calcium: 9.3 mg/dL (ref 8.9–10.3)
Chloride: 96 mmol/L — ABNORMAL LOW (ref 98–111)
Creatinine: 1.02 mg/dL (ref 0.61–1.24)
GFR, Estimated: >60 mL/min (ref >60)
Glucose, Bld: 229 mg/dL — ABNORMAL HIGH (ref 70–99)
Potassium: 5.2 mmol/L — ABNORMAL HIGH (ref 3.5–5.1)
Sodium: 136 mmol/L (ref 135–145)
Total Bilirubin: 0.8 mg/dL (ref 0.3–1.2)
Total Protein: 6.9 g/dL (ref 6.5–8.1)

## 2021-10-04 LAB — RAD ONC ARIA SESSION SUMMARY
Course Elapsed Days: 21
Plan Fractions Treated to Date: 15
Plan Prescribed Dose Per Fraction: 2 Gy
Plan Total Fractions Prescribed: 30
Plan Total Prescribed Dose: 60 Gy
Reference Point Dosage Given to Date: 30 Gy
Reference Point Session Dosage Given: 2 Gy
Session Number: 15

## 2021-10-04 MED ORDER — METHYLPREDNISOLONE SODIUM SUCC 125 MG IJ SOLR
125.0000 mg | Freq: Once | INTRAMUSCULAR | Status: AC
  Administered 2021-10-04: 125 mg via INTRAVENOUS
  Filled 2021-10-04: qty 2

## 2021-10-04 MED ORDER — SODIUM CHLORIDE 0.9 % IV SOLN: Freq: Once | INTRAVENOUS | Status: AC

## 2021-10-04 MED ORDER — SODIUM CHLORIDE 0.9 % IV SOLN
284.8000 mg | Freq: Once | INTRAVENOUS | Status: AC
  Administered 2021-10-04: 280 mg via INTRAVENOUS
  Filled 2021-10-04: qty 28

## 2021-10-04 MED ORDER — DIPHENHYDRAMINE HCL 50 MG/ML IJ SOLN
50.0000 mg | Freq: Once | INTRAMUSCULAR | Status: AC
  Administered 2021-10-04: 50 mg via INTRAVENOUS
  Filled 2021-10-04: qty 1

## 2021-10-04 MED ORDER — FAMOTIDINE IN NACL 20-0.9 MG/50ML-% IV SOLN
20.0000 mg | Freq: Once | INTRAVENOUS | Status: AC
  Administered 2021-10-04: 20 mg via INTRAVENOUS
  Filled 2021-10-04: qty 50

## 2021-10-04 MED ORDER — SODIUM CHLORIDE 0.9 % IV SOLN
45.0000 mg/m2 | Freq: Once | INTRAVENOUS | Status: AC
  Administered 2021-10-04: 114 mg via INTRAVENOUS
  Filled 2021-10-04: qty 19

## 2021-10-04 MED ORDER — PALONOSETRON HCL INJECTION 0.25 MG/5ML
0.2500 mg | Freq: Once | INTRAVENOUS | Status: AC
  Administered 2021-10-04: 0.25 mg via INTRAVENOUS
  Filled 2021-10-04: qty 5

## 2021-10-04 NOTE — Progress Notes (Signed)
Patient aware to stop taking potassium tablets for the next 4 days per Dr. Julien Nordmann and Fritz Pickerel. PA ?

## 2021-10-04 NOTE — Patient Instructions (Signed)
[facility]  Discharge Instructions: ?Thank you for choosing [facility] to provide your oncology and hematology care.  ? ?If you have a lab appointment with the [facility], please go directly to the [facility] and check in at the registration area. ?  ?Wear comfortable clothing and clothing appropriate for easy access to any Portacath or PICC line.  ? ?We strive to give you quality time with your provider. You may need to reschedule your appointment if you arrive late (15 or more minutes).  Arriving late affects you and other patients whose appointments are after yours.  Also, if you miss three or more appointments without notifying the office, you may be dismissed from the clinic at the provider?s discretion.    ?  ?For prescription refill requests, have your pharmacy contact our office and allow 72 hours for refills to be completed.   ? ?Today you received the following chemotherapy and/or immunotherapy agents : Taxol, Carboplatin    ?  ?To help prevent nausea and vomiting after your treatment, we encourage you to take your nausea medication as directed. ? ?BELOW ARE SYMPTOMS THAT SHOULD BE REPORTED IMMEDIATELY: ?*FEVER GREATER THAN 100.4 F (38 ?C) OR HIGHER ?*CHILLS OR SWEATING ?*NAUSEA AND VOMITING THAT IS NOT CONTROLLED WITH YOUR NAUSEA MEDICATION ?*UNUSUAL SHORTNESS OF BREATH ?*UNUSUAL BRUISING OR BLEEDING ?*URINARY PROBLEMS (pain or burning when urinating, or frequent urination) ?*BOWEL PROBLEMS (unusual diarrhea, constipation, pain near the anus) ?TENDERNESS IN MOUTH AND THROAT WITH OR WITHOUT PRESENCE OF ULCERS (sore throat, sores in mouth, or a toothache) ?UNUSUAL RASH, SWELLING OR PAIN  ?UNUSUAL VAGINAL DISCHARGE OR ITCHING  ? ?Items with * indicate a potential emergency and should be followed up as soon as possible or go to the Emergency Department if any problems should occur. ? ?Please show the CHEMOTHERAPY ALERT CARD or IMMUNOTHERAPY ALERT CARD at  check-in to the Emergency Department and triage nurse. ? ?Should you have questions after your visit or need to cancel or reschedule your appointment, please contact [facility]  Dept: [phone]  and follow the prompts.  Office hours are 8:00 a.m. to 4:30 p.m. Monday - Friday. Please note that voicemails left after 4:00 p.m. may not be returned until the following business day.  We are closed weekends and major holidays. You have access to a nurse at all times for urgent questions. Please call the main number to the clinic Dept: [phone] and follow the prompts. ? ? ?For any non-urgent questions, you may also contact your provider using MyChart. We now offer e-Visits for anyone 24 and older to request care online for non-urgent symptoms. For details visit [email or web address]. ?  ?Also download the MyChart app! Go to the app store, search "MyChart", open the app, select [facility], and log in with your MyChart username and password. ? ?Due to Covid, a mask is required upon entering the hospital/clinic. If you do not have a mask, one will be given to you upon arrival. For doctor visits, patients may have 1 support person aged 25 or older with them. For treatment visits, patients cannot have anyone with them due to current Covid guidelines and our immunocompromised population.  ? ?

## 2021-10-05 ENCOUNTER — Ambulatory Visit
Admission: RE | Admit: 2021-10-05 | Discharge: 2021-10-05 | Disposition: A | Discharge status: Home / Self Care | Payer: Medicare HMO | Source: Ambulatory Visit | Attending: Radiation Oncology | Admitting: Radiation Oncology

## 2021-10-05 ENCOUNTER — Other Ambulatory Visit: Payer: Self-pay

## 2021-10-05 DIAGNOSIS — C3492 Malignant neoplasm of unspecified part of left bronchus or lung: Secondary | ICD-10-CM | POA: Diagnosis not present

## 2021-10-05 DIAGNOSIS — Z510 Encounter for antineoplastic radiation therapy: Secondary | ICD-10-CM | POA: Diagnosis not present

## 2021-10-05 DIAGNOSIS — R69 Illness, unspecified: Secondary | ICD-10-CM | POA: Diagnosis not present

## 2021-10-05 DIAGNOSIS — C3412 Malignant neoplasm of upper lobe, left bronchus or lung: Secondary | ICD-10-CM | POA: Diagnosis not present

## 2021-10-05 LAB — RAD ONC ARIA SESSION SUMMARY
Course Elapsed Days: 22
Plan Fractions Treated to Date: 16
Plan Prescribed Dose Per Fraction: 2 Gy
Plan Total Fractions Prescribed: 30
Plan Total Prescribed Dose: 60 Gy
Reference Point Dosage Given to Date: 32 Gy
Reference Point Session Dosage Given: 2 Gy
Session Number: 16

## 2021-10-06 ENCOUNTER — Ambulatory Visit
Admission: RE | Admit: 2021-10-06 | Discharge: 2021-10-06 | Disposition: A | Discharge status: Home / Self Care | Payer: Medicare HMO | Source: Ambulatory Visit | Attending: Radiation Oncology | Admitting: Radiation Oncology

## 2021-10-06 ENCOUNTER — Encounter (HOSPITAL_COMMUNITY): Payer: Self-pay

## 2021-10-06 ENCOUNTER — Other Ambulatory Visit: Payer: Self-pay

## 2021-10-06 DIAGNOSIS — C3412 Malignant neoplasm of upper lobe, left bronchus or lung: Secondary | ICD-10-CM | POA: Diagnosis not present

## 2021-10-06 DIAGNOSIS — Z510 Encounter for antineoplastic radiation therapy: Secondary | ICD-10-CM | POA: Diagnosis not present

## 2021-10-06 DIAGNOSIS — R69 Illness, unspecified: Secondary | ICD-10-CM | POA: Diagnosis not present

## 2021-10-06 DIAGNOSIS — C3492 Malignant neoplasm of unspecified part of left bronchus or lung: Secondary | ICD-10-CM | POA: Diagnosis not present

## 2021-10-06 LAB — RAD ONC ARIA SESSION SUMMARY
Course Elapsed Days: 23
Plan Fractions Treated to Date: 17
Plan Prescribed Dose Per Fraction: 2 Gy
Plan Total Fractions Prescribed: 30
Plan Total Prescribed Dose: 60 Gy
Reference Point Dosage Given to Date: 34 Gy
Reference Point Session Dosage Given: 2 Gy
Session Number: 17

## 2021-10-07 ENCOUNTER — Other Ambulatory Visit: Payer: Self-pay

## 2021-10-07 ENCOUNTER — Ambulatory Visit
Admission: RE | Admit: 2021-10-07 | Discharge: 2021-10-07 | Disposition: A | Discharge status: Home / Self Care | Payer: Medicare HMO | Source: Ambulatory Visit | Attending: Radiation Oncology | Admitting: Radiation Oncology

## 2021-10-07 DIAGNOSIS — R69 Illness, unspecified: Secondary | ICD-10-CM | POA: Diagnosis not present

## 2021-10-07 DIAGNOSIS — C3412 Malignant neoplasm of upper lobe, left bronchus or lung: Secondary | ICD-10-CM | POA: Diagnosis not present

## 2021-10-07 DIAGNOSIS — C3492 Malignant neoplasm of unspecified part of left bronchus or lung: Secondary | ICD-10-CM | POA: Diagnosis not present

## 2021-10-07 DIAGNOSIS — Z510 Encounter for antineoplastic radiation therapy: Secondary | ICD-10-CM | POA: Diagnosis not present

## 2021-10-07 LAB — RAD ONC ARIA SESSION SUMMARY
Course Elapsed Days: 24
Plan Fractions Treated to Date: 18
Plan Prescribed Dose Per Fraction: 2 Gy
Plan Total Fractions Prescribed: 30
Plan Total Prescribed Dose: 60 Gy
Reference Point Dosage Given to Date: 36 Gy
Reference Point Session Dosage Given: 2 Gy
Session Number: 18

## 2021-10-08 ENCOUNTER — Other Ambulatory Visit: Payer: Self-pay

## 2021-10-08 ENCOUNTER — Ambulatory Visit
Admission: RE | Admit: 2021-10-08 | Discharge: 2021-10-08 | Disposition: A | Discharge status: Home / Self Care | Payer: Medicare HMO | Source: Ambulatory Visit | Attending: Radiation Oncology | Admitting: Radiation Oncology

## 2021-10-08 DIAGNOSIS — C3412 Malignant neoplasm of upper lobe, left bronchus or lung: Secondary | ICD-10-CM | POA: Diagnosis not present

## 2021-10-08 DIAGNOSIS — J9601 Acute respiratory failure with hypoxia: Secondary | ICD-10-CM | POA: Diagnosis not present

## 2021-10-08 DIAGNOSIS — Z510 Encounter for antineoplastic radiation therapy: Secondary | ICD-10-CM | POA: Diagnosis not present

## 2021-10-08 DIAGNOSIS — I5022 Chronic systolic (congestive) heart failure: Secondary | ICD-10-CM | POA: Diagnosis not present

## 2021-10-08 DIAGNOSIS — C3492 Malignant neoplasm of unspecified part of left bronchus or lung: Secondary | ICD-10-CM | POA: Diagnosis not present

## 2021-10-08 DIAGNOSIS — R69 Illness, unspecified: Secondary | ICD-10-CM | POA: Diagnosis not present

## 2021-10-08 LAB — RAD ONC ARIA SESSION SUMMARY
Course Elapsed Days: 25
Plan Fractions Treated to Date: 19
Plan Prescribed Dose Per Fraction: 2 Gy
Plan Total Fractions Prescribed: 30
Plan Total Prescribed Dose: 60 Gy
Reference Point Dosage Given to Date: 38 Gy
Reference Point Session Dosage Given: 2 Gy
Session Number: 19

## 2021-10-11 ENCOUNTER — Other Ambulatory Visit: Payer: Self-pay

## 2021-10-11 ENCOUNTER — Ambulatory Visit
Admission: RE | Admit: 2021-10-11 | Discharge: 2021-10-11 | Disposition: A | Discharge status: Home / Self Care | Payer: Medicare HMO | Source: Ambulatory Visit | Attending: Radiation Oncology | Admitting: Radiation Oncology

## 2021-10-11 ENCOUNTER — Inpatient Hospital Stay: Payer: Medicare HMO

## 2021-10-11 VITALS — BP 119/73 | HR 84 | Temp 98.4°F | Resp 18 | Wt 258.8 lb

## 2021-10-11 DIAGNOSIS — Z5111 Encounter for antineoplastic chemotherapy: Secondary | ICD-10-CM | POA: Diagnosis not present

## 2021-10-11 DIAGNOSIS — Z79899 Other long term (current) drug therapy: Secondary | ICD-10-CM | POA: Diagnosis not present

## 2021-10-11 DIAGNOSIS — C3412 Malignant neoplasm of upper lobe, left bronchus or lung: Secondary | ICD-10-CM | POA: Diagnosis not present

## 2021-10-11 DIAGNOSIS — C3492 Malignant neoplasm of unspecified part of left bronchus or lung: Secondary | ICD-10-CM

## 2021-10-11 DIAGNOSIS — Z452 Encounter for adjustment and management of vascular access device: Secondary | ICD-10-CM | POA: Diagnosis not present

## 2021-10-11 DIAGNOSIS — R69 Illness, unspecified: Secondary | ICD-10-CM | POA: Diagnosis not present

## 2021-10-11 DIAGNOSIS — Z510 Encounter for antineoplastic radiation therapy: Secondary | ICD-10-CM | POA: Diagnosis not present

## 2021-10-11 LAB — RAD ONC ARIA SESSION SUMMARY
Course Elapsed Days: 28
Plan Fractions Treated to Date: 20
Plan Prescribed Dose Per Fraction: 2 Gy
Plan Total Fractions Prescribed: 30
Plan Total Prescribed Dose: 60 Gy
Reference Point Dosage Given to Date: 40 Gy
Reference Point Session Dosage Given: 2 Gy
Session Number: 20

## 2021-10-11 LAB — CBC WITH DIFFERENTIAL (CANCER CENTER ONLY)
Abs Immature Granulocytes: 0.02 10*3/uL (ref 0.00–0.07)
Basophils Absolute: 0 10*3/uL (ref 0.0–0.1)
Basophils Relative: 0 %
Eosinophils Absolute: 0 10*3/uL (ref 0.0–0.5)
Eosinophils Relative: 0 %
HCT: 36.7 % — ABNORMAL LOW (ref 39.0–52.0)
Hemoglobin: 12.4 g/dL — ABNORMAL LOW (ref 13.0–17.0)
Immature Granulocytes: 1 %
Lymphocytes Relative: 19 %
Lymphs Abs: 0.5 10*3/uL — ABNORMAL LOW (ref 0.7–4.0)
MCH: 34 pg (ref 26.0–34.0)
MCHC: 33.8 g/dL (ref 30.0–36.0)
MCV: 100.5 fL — ABNORMAL HIGH (ref 80.0–100.0)
Monocytes Absolute: 0.2 10*3/uL (ref 0.1–1.0)
Monocytes Relative: 9 %
Neutro Abs: 1.9 10*3/uL (ref 1.7–7.7)
Neutrophils Relative %: 71 %
Platelet Count: 109 10*3/uL — ABNORMAL LOW (ref 150–400)
RBC: 3.65 MIL/uL — ABNORMAL LOW (ref 4.22–5.81)
RDW: 14.5 % (ref 11.5–15.5)
WBC Count: 2.6 10*3/uL — ABNORMAL LOW (ref 4.0–10.5)
nRBC: 0 % (ref 0.0–0.2)

## 2021-10-11 LAB — CMP (CANCER CENTER ONLY)
ALT: 25 U/L (ref 0–44)
AST: 21 U/L (ref 15–41)
Albumin: 3.8 g/dL (ref 3.5–5.0)
Alkaline Phosphatase: 87 U/L (ref 38–126)
Anion gap: 6 (ref 5–15)
BUN: 18 mg/dL (ref 8–23)
CO2: 34 mmol/L — ABNORMAL HIGH (ref 22–32)
Calcium: 8.9 mg/dL (ref 8.9–10.3)
Chloride: 98 mmol/L (ref 98–111)
Creatinine: 0.96 mg/dL (ref 0.61–1.24)
GFR, Estimated: >60 mL/min (ref >60)
Glucose, Bld: 203 mg/dL — ABNORMAL HIGH (ref 70–99)
Potassium: 3.8 mmol/L (ref 3.5–5.1)
Sodium: 138 mmol/L (ref 135–145)
Total Bilirubin: 0.8 mg/dL (ref 0.3–1.2)
Total Protein: 7.1 g/dL (ref 6.5–8.1)

## 2021-10-11 MED ORDER — PALONOSETRON HCL INJECTION 0.25 MG/5ML
0.2500 mg | Freq: Once | INTRAVENOUS | Status: AC
  Administered 2021-10-11: 0.25 mg via INTRAVENOUS
  Filled 2021-10-11: qty 5

## 2021-10-11 MED ORDER — SODIUM CHLORIDE 0.9 % IV SOLN
284.8000 mg | Freq: Once | INTRAVENOUS | Status: AC
  Administered 2021-10-11: 280 mg via INTRAVENOUS
  Filled 2021-10-11: qty 28

## 2021-10-11 MED ORDER — FAMOTIDINE IN NACL 20-0.9 MG/50ML-% IV SOLN
20.0000 mg | Freq: Once | INTRAVENOUS | Status: AC
  Administered 2021-10-11: 20 mg via INTRAVENOUS
  Filled 2021-10-11: qty 50

## 2021-10-11 MED ORDER — DIPHENHYDRAMINE HCL 50 MG/ML IJ SOLN
50.0000 mg | Freq: Once | INTRAMUSCULAR | Status: AC
  Administered 2021-10-11: 50 mg via INTRAVENOUS
  Filled 2021-10-11: qty 1

## 2021-10-11 MED ORDER — SODIUM CHLORIDE 0.9 % IV SOLN
45.0000 mg/m2 | Freq: Once | INTRAVENOUS | Status: AC
  Administered 2021-10-11: 114 mg via INTRAVENOUS
  Filled 2021-10-11: qty 19

## 2021-10-11 MED ORDER — METHYLPREDNISOLONE SODIUM SUCC 125 MG IJ SOLR
125.0000 mg | Freq: Once | INTRAMUSCULAR | Status: AC
  Administered 2021-10-11: 125 mg via INTRAVENOUS
  Filled 2021-10-11: qty 2

## 2021-10-11 MED ORDER — SODIUM CHLORIDE 0.9 % IV SOLN: Freq: Once | INTRAVENOUS | Status: AC

## 2021-10-11 NOTE — Patient Instructions (Signed)
[facility]  Discharge Instructions: Thank you for choosing [facility] to provide your oncology and hematology care.   If you have a lab appointment with the Cancer Center, please go directly to the Cancer Center and check in at the registration area.   Wear comfortable clothing and clothing appropriate for easy access to any Portacath or PICC line.   We strive to give you quality time with your provider. You may need to reschedule your appointment if you arrive late (15 or more minutes).  Arriving late affects you and other patients whose appointments are after yours.  Also, if you miss three or more appointments without notifying the office, you may be dismissed from the clinic at the provider's discretion.      For prescription refill requests, have your pharmacy contact our office and allow 72 hours for refills to be completed.    Today you received the following chemotherapy and/or immunotherapy agents: Taxol & Carboplatin   To help prevent nausea and vomiting after your treatment, we encourage you to take your nausea medication as directed.  BELOW ARE SYMPTOMS THAT SHOULD BE REPORTED IMMEDIATELY: *FEVER GREATER THAN 100.4 F (38 C) OR HIGHER *CHILLS OR SWEATING *NAUSEA AND VOMITING THAT IS NOT CONTROLLED WITH YOUR NAUSEA MEDICATION *UNUSUAL SHORTNESS OF BREATH *UNUSUAL BRUISING OR BLEEDING *URINARY PROBLEMS (pain or burning when urinating, or frequent urination) *BOWEL PROBLEMS (unusual diarrhea, constipation, pain near the anus) TENDERNESS IN MOUTH AND THROAT WITH OR WITHOUT PRESENCE OF ULCERS (sore throat, sores in mouth, or a toothache) UNUSUAL RASH, SWELLING OR PAIN  UNUSUAL VAGINAL DISCHARGE OR ITCHING   Items with * indicate a potential emergency and should be followed up as soon as possible or go to the Emergency Department if any problems should occur.  Please show the CHEMOTHERAPY ALERT CARD or IMMUNOTHERAPY ALERT CARD at  check-in to the Emergency Department and triage nurse.  Should you have questions after your visit or need to cancel or reschedule your appointment, please contact [facility]  Dept: [phone]  and follow the prompts.  Office hours are 8:00 a.m. to 4:30 p.m. Monday - Friday. Please note that voicemails left after 4:00 p.m. may not be returned until the following business day.  We are closed weekends and major holidays. You have access to a nurse at all times for urgent questions. Please call the main number to the clinic Dept: [phone] and follow the prompts.   For any non-urgent questions, you may also contact your provider using MyChart. We now offer e-Visits for anyone 18 and older to request care online for non-urgent symptoms. For details visit [email or web address].   Also download the MyChart app! Go to the app store, search "MyChart", open the app, select [facility], and log in with your MyChart username and password.  Due to Covid, a mask is required upon entering the hospital/clinic. If you do not have a mask, one will be given to you upon arrival. For doctor visits, patients may have 1 support person aged 18 or older with them. For treatment visits, patients cannot have anyone with them due to current Covid guidelines and our immunocompromised population.   

## 2021-10-12 ENCOUNTER — Other Ambulatory Visit: Payer: Self-pay

## 2021-10-12 ENCOUNTER — Ambulatory Visit
Admission: RE | Admit: 2021-10-12 | Discharge: 2021-10-12 | Disposition: A | Discharge status: Home / Self Care | Payer: Medicare HMO | Source: Ambulatory Visit | Attending: Radiation Oncology | Admitting: Radiation Oncology

## 2021-10-12 DIAGNOSIS — C3492 Malignant neoplasm of unspecified part of left bronchus or lung: Secondary | ICD-10-CM | POA: Diagnosis not present

## 2021-10-12 DIAGNOSIS — R69 Illness, unspecified: Secondary | ICD-10-CM | POA: Diagnosis not present

## 2021-10-12 DIAGNOSIS — Z510 Encounter for antineoplastic radiation therapy: Secondary | ICD-10-CM | POA: Diagnosis not present

## 2021-10-12 DIAGNOSIS — C3412 Malignant neoplasm of upper lobe, left bronchus or lung: Secondary | ICD-10-CM | POA: Diagnosis not present

## 2021-10-12 LAB — RAD ONC ARIA SESSION SUMMARY
Course Elapsed Days: 29
Plan Fractions Treated to Date: 21
Plan Prescribed Dose Per Fraction: 2 Gy
Plan Total Fractions Prescribed: 30
Plan Total Prescribed Dose: 60 Gy
Reference Point Dosage Given to Date: 42 Gy
Reference Point Session Dosage Given: 2 Gy
Session Number: 21

## 2021-10-13 ENCOUNTER — Encounter: Payer: Self-pay | Admitting: *Deleted

## 2021-10-13 ENCOUNTER — Ambulatory Visit
Admission: RE | Admit: 2021-10-13 | Discharge: 2021-10-13 | Disposition: A | Discharge status: Home / Self Care | Payer: Medicare HMO | Source: Ambulatory Visit | Attending: Radiation Oncology | Admitting: Radiation Oncology

## 2021-10-13 ENCOUNTER — Other Ambulatory Visit: Payer: Self-pay

## 2021-10-13 DIAGNOSIS — C3492 Malignant neoplasm of unspecified part of left bronchus or lung: Secondary | ICD-10-CM | POA: Diagnosis not present

## 2021-10-13 DIAGNOSIS — Z510 Encounter for antineoplastic radiation therapy: Secondary | ICD-10-CM | POA: Diagnosis not present

## 2021-10-13 DIAGNOSIS — C3412 Malignant neoplasm of upper lobe, left bronchus or lung: Secondary | ICD-10-CM | POA: Diagnosis not present

## 2021-10-13 DIAGNOSIS — R69 Illness, unspecified: Secondary | ICD-10-CM | POA: Diagnosis not present

## 2021-10-13 LAB — RAD ONC ARIA SESSION SUMMARY
Course Elapsed Days: 30
Plan Fractions Treated to Date: 22
Plan Prescribed Dose Per Fraction: 2 Gy
Plan Total Fractions Prescribed: 30
Plan Total Prescribed Dose: 60 Gy
Reference Point Dosage Given to Date: 44 Gy
Reference Point Session Dosage Given: 2 Gy
Session Number: 22

## 2021-10-13 NOTE — Progress Notes (Signed)
Oncology Nurse Navigator Documentation     10/13/2021   12:00 PM 09/09/2021    3:00 PM 09/03/2021   11:00 AM 09/02/2021   11:00 AM 09/01/2021    1:00 PM 08/27/2021   12:00 PM  Oncology Nurse Navigator Flowsheets  Abnormal Finding Date      07/09/2021  Confirmed Diagnosis Date      08/26/2021  Diagnosis Status      Pathology Pending  Planned Course of Treatment  Chemo/Radiation Concurrent      Phase of Treatment  Radiation      Chemotherapy Actual Start Date:  09/13/2021      Radiation Actual Start Date:  09/13/2021      Navigator Follow Up Date: 10/19/2021 09/20/2021 09/06/2021 09/06/2021  09/01/2021  Navigator Follow Up Reason: Follow-up Appointment Follow-up Appointment Radiology Appointment Review  New Patient Appointment  Navigator Location [facility]  Referral Date to RadOnc/MedOnc      08/26/2021  Navigator Encounter Type Appt/Treatment Plan Review Appt/Treatment Plan Review Appt/Treatment Plan Review Other: Molecular Studies Introductory Phone Call  Treatment Initiated Date   09/13/2021     Patient Visit Type   Other     Treatment Phase Treatment Pre-Tx/Tx Discussion Pre-Tx/Tx Discussion Pre-Tx/Tx Discussion Pre-Tx/Tx Discussion Pre-Tx/Tx Discussion  Barriers/Navigation Needs Coordination of Care/I followed up on [name] plan of care. He had his MRI brain and is set up for another scan next week.  His tx plan is scheduled at this time.  Coordination of Care Coordination of Care Coordination of Care Coordination of Care Coordination of Care;Education  Education      Other  Interventions Coordination of Care Coordination of Care Coordination of Care Coordination of Care Coordination of Care Coordination of Care;Education;Psycho-Social Support  Acuity Level 2-Minimal Needs (1-2 Barriers Identified) Level 2-Minimal Needs (1-2 Barriers Identified) Level 2-Minimal Needs (1-2 Barriers Identified) Level 2-Minimal  Needs (1-2 Barriers Identified) Level 2-Minimal Needs (1-2 Barriers Identified) Level 2-Minimal Needs (1-2 Barriers Identified)  Coordination of Care Other Other Radiology Other Pathology Appts  Education Method      Verbal  Time Spent with Patient 30 30 30 15 30  30

## 2021-10-14 ENCOUNTER — Other Ambulatory Visit: Payer: Self-pay

## 2021-10-14 ENCOUNTER — Ambulatory Visit
Admission: RE | Admit: 2021-10-14 | Discharge: 2021-10-14 | Disposition: A | Discharge status: Home / Self Care | Payer: Medicare HMO | Source: Ambulatory Visit | Attending: Radiation Oncology | Admitting: Radiation Oncology

## 2021-10-14 DIAGNOSIS — R69 Illness, unspecified: Secondary | ICD-10-CM | POA: Diagnosis not present

## 2021-10-14 DIAGNOSIS — C3412 Malignant neoplasm of upper lobe, left bronchus or lung: Secondary | ICD-10-CM | POA: Diagnosis not present

## 2021-10-14 DIAGNOSIS — C3492 Malignant neoplasm of unspecified part of left bronchus or lung: Secondary | ICD-10-CM | POA: Diagnosis not present

## 2021-10-14 DIAGNOSIS — Z510 Encounter for antineoplastic radiation therapy: Secondary | ICD-10-CM | POA: Diagnosis not present

## 2021-10-14 LAB — RAD ONC ARIA SESSION SUMMARY
Course Elapsed Days: 31
Plan Fractions Treated to Date: 23
Plan Prescribed Dose Per Fraction: 2 Gy
Plan Total Fractions Prescribed: 30
Plan Total Prescribed Dose: 60 Gy
Reference Point Dosage Given to Date: 46 Gy
Reference Point Session Dosage Given: 2 Gy
Session Number: 23

## 2021-10-14 NOTE — Progress Notes (Signed)
[place] OFFICE PROGRESS NOTE  [name] Samples, PA-C [place] [address]  DIAGNOSIS: Stage IIIb (T3, N2, M0) non-small cell lung cancer, squamous cell carcinoma presented with large left upper lobe lung mass in addition to mediastinal lymphadenopathy diagnosed in April 2023. The patient has a history of colon cancer treated at [facility] in 2014 status post surgical resection followed by adjuvant systemic chemotherapy.  PRIOR THERAPY: None  CURRENT THERAPY: Concurrent chemoradiation with weekly carboplatin for AUC of 2 and paclitaxel 45 Mg/M2.  Status post 5 cycles.  INTERVAL HISTORY: [name] 66 y.o. male returns to the clinic today for a follow-up visit accompanied by his wife.  The patient was recently diagnosed with stage III lung cancer.  He is currently undergoing a course of concurrent chemoradiation.  He is status post 5 cycles and is tolerated fairly well except with a prior infusion, he developed a reaction to Taxol which required additional albuterol and Solu-Medrol.  Going forward, he has premedications adjusted which contains solumedrol, aloxi, pepcid, and benadryl. He has been tolerating taxol well with the adjusted pre-medications.   Otherwise, the patient denies any new concerning complaints.  Denies any fever, chills, night sweats, or unexplained weight loss.  Denies any odynophagia or dysphagia.  Denies any nausea, vomiting, diarrhea, or constipation.  Denies any headache or visual changes. He denies hemoptysis or chest pain. He denies significant shortness of breath. He has a intermittent cough only if he is laying on his left side. He is here today for evaluation and repeat blood work before starting cycle #6.  His last day of radiation is tentatively scheduled for 10/29/21.       MEDICAL HISTORY: Past Medical History:  Diagnosis Date   Atrial flutter ([place])    s/p DCCV 06/03/21   CHF (congestive heart  failure) ([place])    Colon cancer ([place]) 04/22/2013   Hypertension    Lung cancer ([place]) 08/26/2021   Renal disorder    Kidney stones    ALLERGIES:  is allergic to paclitaxel and penicillins.  MEDICATIONS:  Current Outpatient Medications  Medication Sig Dispense Refill   acetaminophen (TYLENOL) 325 MG tablet Take 2 tablets (650 mg total) by mouth every 6 (six) hours as needed for mild pain (or Fever >/= 101). 12 tablet 0   apixaban (ELIQUIS) 5 MG TABS tablet Take 1 tablet (5 mg total) by mouth 2 (two) times daily. 60 tablet 2   atorvastatin (LIPITOR) 20 MG tablet Take 1 tablet (20 mg total) by mouth every evening. 30 tablet 3   bisoprolol (ZEBETA) 5 MG tablet Take 1 tablet (5 mg total) by mouth daily. For BP and Heart Rate Control 30 tablet 5   Budeson-Glycopyrrol-Formoterol (BREZTRI AEROSPHERE) 160-9-4.8 MCG/ACT AERO Inhale 2 puffs into the lungs in the morning and at bedtime. 10.7 g 0   glimepiride (AMARYL) 2 MG tablet Take 1 tablet (2 mg total) by mouth every morning. 30 tablet 11   Multiple Vitamin (MULTI-VITAMIN DAILY PO) Take 1 tablet by mouth daily.     prochlorperazine (COMPAZINE) 10 MG tablet Take 1 tablet (10 mg total) by mouth every 6 (six) hours as needed for nausea or vomiting. 30 tablet 0   torsemide 40 MG TABS Take 40 mg by mouth every morning. 30 tablet 2   albuterol (PROVENTIL) (2.5 MG/3ML) 0.083% nebulizer solution Take 3 mLs by nebulization every 6 (six) hours as needed for wheezing or shortness of breath. (Patient not taking: Reported on  09/01/2021) 75 mL 12   albuterol (VENTOLIN HFA) 108 (90 Base) MCG/ACT inhaler Inhale 2 puffs into the lungs every 6 (six) hours as needed for wheezing or shortness of breath. (Patient not taking: Reported on 09/01/2021) 18 g 2   potassium chloride (KLOR-CON) 10 MEQ tablet Take 1 tablet (10 mEq total) by mouth daily. Take While taking Lasix/furosemide (Patient not taking: Reported on 10/19/2021) 30 tablet 2   No current facility-administered  medications for this visit.    SURGICAL HISTORY:  Past Surgical History:  Procedure Laterality Date   BRONCHIAL BIOPSY  08/26/2021   Procedure: BRONCHIAL BIOPSIES;  Surgeon: Garner Nash, DO;  Location: [facility];  Service: Pulmonary;;   BRONCHIAL BRUSHINGS  08/26/2021   Procedure: BRONCHIAL BRUSHINGS;  Surgeon: Garner Nash, DO;  Location: [facility];  Service: Pulmonary;;   BRONCHIAL NEEDLE ASPIRATION BIOPSY  08/26/2021   Procedure: BRONCHIAL NEEDLE ASPIRATION BIOPSIES;  Surgeon: Garner Nash, DO;  Location: [facility];  Service: Pulmonary;;   CARDIOVERSION N/A 06/03/2021   Procedure: CARDIOVERSION;  Surgeon: Satira Sark, MD;  Location: [facility];  Service: Cardiovascular;  Laterality: N/A;   COLONOSCOPY N/A 04/29/2013   Procedure: COLONOSCOPY;  Surgeon: Danie Binder, MD;  [facility];  Service: Endoscopy;  Laterality: N/A;  10:30AM   Cyst removed from left elbow     ENDOVENOUS ABLATION SAPHENOUS VEIN W/ LASER Left 01/16/2019   endovenous laser ablation left greater saphenous vein by Ruta Hinds MD   KIDNEY STONE SURGERY     LITHOTRIPSY     PARTIAL COLECTOMY     TEE WITHOUT CARDIOVERSION N/A 06/03/2021   Procedure: TRANSESOPHAGEAL ECHOCARDIOGRAM (TEE);  Surgeon: Satira Sark, MD;  Location: [facility];  Service: Cardiovascular;  Laterality: N/A;   VIDEO BRONCHOSCOPY WITH ENDOBRONCHIAL ULTRASOUND Bilateral 08/26/2021   Procedure: VIDEO BRONCHOSCOPY WITH ENDOBRONCHIAL ULTRASOUND;  Surgeon: Garner Nash, DO;  Location: [facility];  Service: Pulmonary;  Laterality: Bilateral;  will need Guardant [id number]    REVIEW OF SYSTEMS:   Review of Systems  Constitutional: Negative for appetite change, chills, fatigue, fever and unexpected weight change.  HENT: Negative for mouth sores, nosebleeds, sore throat and trouble swallowing.   Eyes: Negative for eye problems and icterus.  Respiratory: Positive for mild cough only if laying on left side. Negative for  hemoptysis, shortness of breath and wheezing.   Cardiovascular: Negative for chest pain and leg swelling.  Gastrointestinal: Negative for abdominal pain, constipation, diarrhea, nausea and vomiting.  Genitourinary: Negative for bladder incontinence, difficulty urinating, dysuria, frequency and hematuria.   Musculoskeletal: Negative for back pain, gait problem, neck pain and neck stiffness.  Skin: Negative for itching and rash.  Neurological: Negative for dizziness, extremity weakness, gait problem, headaches, light-headedness and seizures.  Hematological: Negative for adenopathy. Does not bruise/bleed easily.  Psychiatric/Behavioral: Negative for confusion, depression and sleep disturbance. The patient is not nervous/anxious.     PHYSICAL EXAMINATION:  Blood pressure (!) 108/59, pulse 82, temperature 97.8 F (36.6 C), temperature source Axillary, resp. rate 18, height 6' (1.829 m), weight 265 lb 9.6 oz (120.5 kg), SpO2 95 %.  ECOG PERFORMANCE STATUS: 1  Physical Exam  Constitutional: Oriented to person, place, and time and well-developed, well-nourished, and in no distress.  HENT:  Head: Normocephalic and atraumatic.  Mouth/Throat: Oropharynx is clear and moist. No oropharyngeal exudate.  Eyes: Conjunctivae are normal. Right eye exhibits no discharge. Left eye exhibits no discharge. No scleral icterus.  Neck: Normal range of motion. Neck supple.  Cardiovascular: Normal rate, regular rhythm, normal heart sounds and intact distal pulses.   Pulmonary/Chest: Effort normal and breath sounds normal. No respiratory distress. No wheezes. No rales.  Abdominal: Soft. Bowel sounds are normal. Exhibits no distension and no mass. There is no tenderness.  Musculoskeletal: Normal range of motion. Exhibits no edema.  Lymphadenopathy:    No cervical adenopathy.  Neurological: Alert and oriented to person, place, and time. Exhibits normal muscle tone. Gait normal. Coordination normal.  Skin: Skin is  warm and dry. No rash noted. Not diaphoretic. No erythema. No pallor.  Psychiatric: Mood, memory and judgment normal.  Vitals reviewed.  LABORATORY DATA: Lab Results  Component Value Date   WBC 2.7 (L) 10/19/2021   HGB 11.3 (L) 10/19/2021   HCT 33.4 (L) 10/19/2021   MCV 101.5 (H) 10/19/2021   PLT 126 (L) 10/19/2021      Chemistry      Component Value Date/Time   NA 138 10/11/2021 0918   K 3.8 10/11/2021 0918   CL 98 10/11/2021 0918   CO2 34 (H) 10/11/2021 0918   BUN 18 10/11/2021 0918   CREATININE 0.96 10/11/2021 0918      Component Value Date/Time   CALCIUM 8.9 10/11/2021 0918   ALKPHOS 87 10/11/2021 0918   AST 21 10/11/2021 0918   ALT 25 10/11/2021 0918   BILITOT 0.8 10/11/2021 0918       RADIOGRAPHIC STUDIES:  DG Chest 2 View  Result Date: 09/20/2021 CLINICAL DATA:  Atrial flutter. EXAM: CHEST - 2 VIEW COMPARISON:  Radiographs 07/08/2021. CT 07/23/2021 and PET-CT 08/12/2021. FINDINGS: The heart size and mediastinal contours are stable, although the left heart border remains partially obscured by an enlarging and ill-defined mass in the lingula. The right lung is clear. There is no pleural effusion or pneumothorax. No acute osseous findings are evident. There are old left-sided rib fractures and mild degenerative changes throughout the spine. Telemetry leads overlie the chest. IMPRESSION: 1. Previously demonstrated left upper lobe mass has enlarged from prior radiographs with increased surrounding patchy airspace disease. This lesion was hypermetabolic on prior PET-CT and presumably reflects lung cancer. 2. No definite acute superimposed process. Electronically Signed   By: Richardean Sale M.D.   On: 09/20/2021 10:15   MR Brain W Wo Contrast  Result Date: 09/22/2021 CLINICAL DATA:  Non-small cell lung cancer (NSCLC), staging EXAM: MRI HEAD WITHOUT AND WITH CONTRAST TECHNIQUE: Multiplanar, multiecho pulse sequences of the brain and surrounding structures were obtained without  and with intravenous contrast. CONTRAST:  40mL GADAVIST GADOBUTROL 1 MMOL/ML IV SOLN COMPARISON:  None Available. FINDINGS: Motion limited study despite repeat attempts. Brain: Small areas of peripheral restricted diffusion in the left temporal lobe and right parietal lobe without definite correlate enhancement on the motion limited postcontrast imaging. Mild associated susceptibility artifact in the right parietal lobe, compatible with small amount of prior hemorrhage. No mass occupying acute hemorrhage. Small focus of enhancement in the right occipital lobe (series 19, image 67). No hydrocephalus, midline shift, or extra-axial fluid collection. Small remote cerebellar lacunar infarcts. Moderate scattered T2/FLAIR hyperintensities in the white matter, nonspecific but compatible with chronic microvascular disease. Vascular: Major arterial flow voids are maintained at the skull base. Skull and upper cervical spine: Normal marrow signal. Sinuses/Orbits: Mild paranasal sinus mucosal thickening. No acute orbital findings. Other: No mastoid effusions. IMPRESSION: 1. Areas of somewhat peripheral restricted diffusion left temporal and right parietal lobes without definite correlate enhancement on the motion limited postcontrast imaging. Differential considerations include early/small metastases  versus evolving small acute/subacute infarcts. Recommend short interval follow-up MRI with contrast in approximately 4 weeks to further assess. 2. Small focus of enhancement in the right occipital lobe, which could represent a small vessel or a small metastasis. Recommend attention on the recommended follow-up. These results will be called to the ordering clinician or representative by the Radiologist Assistant, and communication documented in the PACS or [employer]. Electronically Signed   By: Margaretha Sheffield M.D.   On: 09/22/2021 16:05     ASSESSMENT/PLAN:  This is a very pleasant 66 year old Caucasian male with stage  IIIb (T3, N2, M0) non-small cell lung cancer, squamous cell carcinoma.  He presented with a left upper lobe lung mass in addition to mediastinal adenopathy.  He was diagnosed in April 2023.  Of note the patient has a history of colorectal cancer treated at [facility] in 2014.  He status post surgical resection followed by adjuvant systemic chemotherapy.  The patient is currently undergoing a course of concurrent chemoradiation with weekly carboplatin for an AUC of 2 and paclitaxel 45 mg per metered squared.  He is status post 5 cycles. The patient had a reaction to paclitaxel at his last infusion which required Solu-Medrol and albuterol.  He has premeds with solumedrol, Aloxi, Pepcid, and Benadryl.   Labs were reviewed.  Recommend that he proceed with cycle #6 today's schedule.  His last day radiation is scheduled for 10/29/2021.  Therefore, the last day of chemotherapy will be next week on 10/26/2021.   I will arrange for restaging CT scan of the chest to be performed approximately 3 weeks after his last radiation date.  We will see the patient back for a  follow-up visit a few days later for evaluation and to review his scan results.  He will continue to follow closely with cardiology for his history of atrial fibrillation.  The patient has been holding his 10 meq of potassium due to hyperkalemia x1 2 weeks ago. Discussed he may resume his potassium 10 meq as prescribed by cardiology at this time as his hyperkalemia resolved.   The patient was advised to call immediately if he has any concerning symptoms in the interval. The patient voices understanding of current disease status and treatment options and is in agreement with the current care plan. All questions were answered. The patient knows to call the clinic with any problems, questions or concerns. We can certainly see the patient much sooner if necessary       Orders Placed This Encounter  Procedures   CT Chest W Contrast     Standing Status:   Future    Standing Expiration Date:   10/19/2022    Order Specific Question:   If indicated for the ordered procedure, I authorize the administration of contrast media per Radiology protocol    Answer:   Yes    Order Specific Question:   Preferred imaging location?    Answer:   [facility]     The total time spent in the appointment was 20-29 minutes.   Mackinzie Vuncannon L Alfonzia Woolum, PA-C 10/19/21

## 2021-10-15 ENCOUNTER — Other Ambulatory Visit: Payer: Self-pay

## 2021-10-15 ENCOUNTER — Ambulatory Visit
Admission: RE | Admit: 2021-10-15 | Discharge: 2021-10-15 | Disposition: A | Discharge status: Home / Self Care | Payer: Medicare HMO | Source: Ambulatory Visit | Attending: Radiation Oncology | Admitting: Radiation Oncology

## 2021-10-15 DIAGNOSIS — R69 Illness, unspecified: Secondary | ICD-10-CM | POA: Diagnosis not present

## 2021-10-15 DIAGNOSIS — Z510 Encounter for antineoplastic radiation therapy: Secondary | ICD-10-CM | POA: Diagnosis not present

## 2021-10-15 DIAGNOSIS — C3412 Malignant neoplasm of upper lobe, left bronchus or lung: Secondary | ICD-10-CM | POA: Diagnosis not present

## 2021-10-15 DIAGNOSIS — C3492 Malignant neoplasm of unspecified part of left bronchus or lung: Secondary | ICD-10-CM | POA: Diagnosis not present

## 2021-10-15 LAB — RAD ONC ARIA SESSION SUMMARY
Course Elapsed Days: 32
Plan Fractions Treated to Date: 24
Plan Prescribed Dose Per Fraction: 2 Gy
Plan Total Fractions Prescribed: 30
Plan Total Prescribed Dose: 60 Gy
Reference Point Dosage Given to Date: 48 Gy
Reference Point Session Dosage Given: 2 Gy
Session Number: 24

## 2021-10-19 ENCOUNTER — Other Ambulatory Visit: Payer: Self-pay

## 2021-10-19 ENCOUNTER — Ambulatory Visit
Admission: RE | Admit: 2021-10-19 | Discharge: 2021-10-19 | Disposition: A | Discharge status: Home / Self Care | Payer: Medicare HMO | Source: Ambulatory Visit | Attending: Radiation Oncology | Admitting: Radiation Oncology

## 2021-10-19 ENCOUNTER — Inpatient Hospital Stay: Payer: Medicare HMO

## 2021-10-19 ENCOUNTER — Inpatient Hospital Stay (HOSPITAL_BASED_OUTPATIENT_CLINIC_OR_DEPARTMENT_OTHER): Payer: Medicare HMO | Admitting: Physician Assistant

## 2021-10-19 ENCOUNTER — Encounter: Payer: Self-pay | Admitting: Physician Assistant

## 2021-10-19 VITALS — BP 108/59 | HR 82 | Temp 97.8°F | Resp 18 | Ht 72.0 in | Wt 265.6 lb

## 2021-10-19 DIAGNOSIS — Z5111 Encounter for antineoplastic chemotherapy: Secondary | ICD-10-CM | POA: Diagnosis not present

## 2021-10-19 DIAGNOSIS — Z510 Encounter for antineoplastic radiation therapy: Secondary | ICD-10-CM | POA: Diagnosis not present

## 2021-10-19 DIAGNOSIS — Z452 Encounter for adjustment and management of vascular access device: Secondary | ICD-10-CM | POA: Diagnosis not present

## 2021-10-19 DIAGNOSIS — C3412 Malignant neoplasm of upper lobe, left bronchus or lung: Secondary | ICD-10-CM | POA: Diagnosis not present

## 2021-10-19 DIAGNOSIS — C3492 Malignant neoplasm of unspecified part of left bronchus or lung: Secondary | ICD-10-CM

## 2021-10-19 DIAGNOSIS — R69 Illness, unspecified: Secondary | ICD-10-CM | POA: Diagnosis not present

## 2021-10-19 DIAGNOSIS — Z79899 Other long term (current) drug therapy: Secondary | ICD-10-CM | POA: Diagnosis not present

## 2021-10-19 LAB — CMP (CANCER CENTER ONLY)
ALT: 24 U/L (ref 0–44)
AST: 20 U/L (ref 15–41)
Albumin: 3.8 g/dL (ref 3.5–5.0)
Alkaline Phosphatase: 76 U/L (ref 38–126)
Anion gap: 5 (ref 5–15)
BUN: 15 mg/dL (ref 8–23)
CO2: 31 mmol/L (ref 22–32)
Calcium: 9.5 mg/dL (ref 8.9–10.3)
Chloride: 103 mmol/L (ref 98–111)
Creatinine: 0.93 mg/dL (ref 0.61–1.24)
GFR, Estimated: >60 mL/min (ref >60)
Glucose, Bld: 117 mg/dL — ABNORMAL HIGH (ref 70–99)
Potassium: 4.4 mmol/L (ref 3.5–5.1)
Sodium: 139 mmol/L (ref 135–145)
Total Bilirubin: 0.5 mg/dL (ref 0.3–1.2)
Total Protein: 6.5 g/dL (ref 6.5–8.1)

## 2021-10-19 LAB — CBC WITH DIFFERENTIAL (CANCER CENTER ONLY)
Abs Immature Granulocytes: 0.01 10*3/uL (ref 0.00–0.07)
Basophils Absolute: 0 10*3/uL (ref 0.0–0.1)
Basophils Relative: 0 %
Eosinophils Absolute: 0 10*3/uL (ref 0.0–0.5)
Eosinophils Relative: 0 %
HCT: 33.4 % — ABNORMAL LOW (ref 39.0–52.0)
Hemoglobin: 11.3 g/dL — ABNORMAL LOW (ref 13.0–17.0)
Immature Granulocytes: 0 %
Lymphocytes Relative: 23 %
Lymphs Abs: 0.6 10*3/uL — ABNORMAL LOW (ref 0.7–4.0)
MCH: 34.3 pg — ABNORMAL HIGH (ref 26.0–34.0)
MCHC: 33.8 g/dL (ref 30.0–36.0)
MCV: 101.5 fL — ABNORMAL HIGH (ref 80.0–100.0)
Monocytes Absolute: 0.3 10*3/uL (ref 0.1–1.0)
Monocytes Relative: 11 %
Neutro Abs: 1.8 10*3/uL (ref 1.7–7.7)
Neutrophils Relative %: 66 %
Platelet Count: 126 10*3/uL — ABNORMAL LOW (ref 150–400)
RBC: 3.29 MIL/uL — ABNORMAL LOW (ref 4.22–5.81)
RDW: 15.1 % (ref 11.5–15.5)
WBC Count: 2.7 10*3/uL — ABNORMAL LOW (ref 4.0–10.5)
nRBC: 1.1 % — ABNORMAL HIGH (ref 0.0–0.2)

## 2021-10-19 LAB — RAD ONC ARIA SESSION SUMMARY
Course Elapsed Days: 36
Plan Fractions Treated to Date: 25
Plan Prescribed Dose Per Fraction: 2 Gy
Plan Total Fractions Prescribed: 30
Plan Total Prescribed Dose: 60 Gy
Reference Point Dosage Given to Date: 50 Gy
Reference Point Session Dosage Given: 2 Gy
Session Number: 25

## 2021-10-19 MED ORDER — SODIUM CHLORIDE 0.9 % IV SOLN: Freq: Once | INTRAVENOUS | Status: AC

## 2021-10-19 MED ORDER — SODIUM CHLORIDE 0.9 % IV SOLN
284.8000 mg | Freq: Once | INTRAVENOUS | Status: AC
  Administered 2021-10-19: 280 mg via INTRAVENOUS
  Filled 2021-10-19: qty 28

## 2021-10-19 MED ORDER — DIPHENHYDRAMINE HCL 50 MG/ML IJ SOLN
50.0000 mg | Freq: Once | INTRAMUSCULAR | Status: AC
  Administered 2021-10-19: 50 mg via INTRAVENOUS
  Filled 2021-10-19: qty 1

## 2021-10-19 MED ORDER — SODIUM CHLORIDE 0.9 % IV SOLN
45.0000 mg/m2 | Freq: Once | INTRAVENOUS | Status: AC
  Administered 2021-10-19: 114 mg via INTRAVENOUS
  Filled 2021-10-19: qty 19

## 2021-10-19 MED ORDER — PALONOSETRON HCL INJECTION 0.25 MG/5ML
0.2500 mg | Freq: Once | INTRAVENOUS | Status: AC
  Administered 2021-10-19: 0.25 mg via INTRAVENOUS
  Filled 2021-10-19: qty 5

## 2021-10-19 MED ORDER — FAMOTIDINE IN NACL 20-0.9 MG/50ML-% IV SOLN
20.0000 mg | Freq: Once | INTRAVENOUS | Status: AC
  Administered 2021-10-19: 20 mg via INTRAVENOUS
  Filled 2021-10-19: qty 50

## 2021-10-19 MED ORDER — METHYLPREDNISOLONE SODIUM SUCC 125 MG IJ SOLR
125.0000 mg | Freq: Once | INTRAMUSCULAR | Status: AC
  Administered 2021-10-19: 125 mg via INTRAVENOUS
  Filled 2021-10-19: qty 2

## 2021-10-19 NOTE — Patient Instructions (Addendum)
[facility]  Discharge Instructions: Thank you for choosing [facility] to provide your oncology and hematology care.   If you have a lab appointment with the [facility], please go directly to the [facility] and check in at the registration area.   Wear comfortable clothing and clothing appropriate for easy access to any Portacath or PICC line.   We strive to give you quality time with your provider. You may need to reschedule your appointment if you arrive late (15 or more minutes).  Arriving late affects you and other patients whose appointments are after yours.  Also, if you miss three or more appointments without notifying the office, you may be dismissed from the clinic at the provider's discretion.      For prescription refill requests, have your pharmacy contact our office and allow 72 hours for refills to be completed.    Today you received the following chemotherapy and/or immunotherapy agents: Taxol Carboplatin      To help prevent nausea and vomiting after your treatment, we encourage you to take your nausea medication as directed.  BELOW ARE SYMPTOMS THAT SHOULD BE REPORTED IMMEDIATELY: *FEVER GREATER THAN 100.4 F (38 C) OR HIGHER *CHILLS OR SWEATING *NAUSEA AND VOMITING THAT IS NOT CONTROLLED WITH YOUR NAUSEA MEDICATION *UNUSUAL SHORTNESS OF BREATH *UNUSUAL BRUISING OR BLEEDING *URINARY PROBLEMS (pain or burning when urinating, or frequent urination) *BOWEL PROBLEMS (unusual diarrhea, constipation, pain near the anus) TENDERNESS IN MOUTH AND THROAT WITH OR WITHOUT PRESENCE OF ULCERS (sore throat, sores in mouth, or a toothache) UNUSUAL RASH, SWELLING OR PAIN  UNUSUAL VAGINAL DISCHARGE OR ITCHING   Items with * indicate a potential emergency and should be followed up as soon as possible or go to the Emergency Department if any problems should occur.  Please show the CHEMOTHERAPY ALERT CARD or IMMUNOTHERAPY ALERT CARD at  check-in to the Emergency Department and triage nurse.  Should you have questions after your visit or need to cancel or reschedule your appointment, please contact [facility]  Dept: [phone]  and follow the prompts.  Office hours are 8:00 a.m. to 4:30 p.m. Monday - Friday. Please note that voicemails left after 4:00 p.m. may not be returned until the following business day.  We are closed weekends and major holidays. You have access to a nurse at all times for urgent questions. Please call the main number to the clinic Dept: [phone] and follow the prompts.   For any non-urgent questions, you may also contact your provider using MyChart. We now offer e-Visits for anyone 67 and older to request care online for non-urgent symptoms. For details visit [email or web address].   Also download the MyChart app! Go to the app store, search "MyChart", open the app, select [facility], and log in with your MyChart username and password.  Due to Covid, a mask is required upon entering the hospital/clinic. If you do not have a mask, one will be given to you upon arrival. For doctor visits, patients may have 1 support person aged 67 or older with them. For treatment visits, patients cannot have anyone with them due to current Covid guidelines and our immunocompromised population.   Paclitaxel injection What is this medication? PACLITAXEL (PAK li TAX el) is a chemotherapy drug. It targets fast dividing cells, like cancer cells, and causes these cells to die. This medicine is used to treat ovarian cancer, breast cancer, lung cancer, Kaposi's sarcoma, and other cancers. This medicine  may be used for other purposes; ask your health care provider or pharmacist if you have questions. COMMON BRAND NAME(S): Onxol, Taxol What should I tell my care team before I take this medication? They need to know if you have any of these conditions: history of irregular heartbeat liver  disease low blood counts, like low white cell, platelet, or red cell counts lung or breathing disease, like asthma tingling of the fingers or toes, or other nerve disorder an unusual or allergic reaction to paclitaxel, alcohol, polyoxyethylated castor oil, other chemotherapy, other medicines, foods, dyes, or preservatives pregnant or trying to get pregnant breast-feeding How should I use this medication? This drug is given as an infusion into a vein. It is administered in a hospital or clinic by a specially trained health care professional. Talk to your pediatrician regarding the use of this medicine in children. Special care may be needed. Overdosage: If you think you have taken too much of this medicine contact a poison control center or emergency room at once. NOTE: This medicine is only for you. Do not share this medicine with others. What if I miss a dose? It is important not to miss your dose. Call your doctor or health care professional if you are unable to keep an appointment. What may interact with this medication? Do not take this medicine with any of the following medications: live virus vaccines This medicine may also interact with the following medications: antiviral medicines for hepatitis, HIV or AIDS certain antibiotics like erythromycin and clarithromycin certain medicines for fungal infections like ketoconazole and itraconazole certain medicines for seizures like carbamazepine, phenobarbital, phenytoin gemfibrozil nefazodone rifampin St. John's wort This list may not describe all possible interactions. Give your health care provider a list of all the medicines, herbs, non-prescription drugs, or dietary supplements you use. Also tell them if you smoke, drink alcohol, or use illegal drugs. Some items may interact with your medicine. What should I watch for while using this medication? Your condition will be monitored carefully while you are receiving this medicine. You  will need important blood work done while you are taking this medicine. This medicine can cause serious allergic reactions. To reduce your risk you will need to take other medicine(s) before treatment with this medicine. If you experience allergic reactions like skin rash, itching or hives, swelling of the face, lips, or tongue, tell your doctor or health care professional right away. In some cases, you may be given additional medicines to help with side effects. Follow all directions for their use. This drug may make you feel generally unwell. This is not uncommon, as chemotherapy can affect healthy cells as well as cancer cells. Report any side effects. Continue your course of treatment even though you feel ill unless your doctor tells you to stop. Call your doctor or health care professional for advice if you get a fever, chills or sore throat, or other symptoms of a cold or flu. Do not treat yourself. This drug decreases your body's ability to fight infections. Try to avoid being around people who are sick. This medicine may increase your risk to bruise or bleed. Call your doctor or health care professional if you notice any unusual bleeding. Be careful brushing and flossing your teeth or using a toothpick because you may get an infection or bleed more easily. If you have any dental work done, tell your dentist you are receiving this medicine. Avoid taking products that contain aspirin, acetaminophen, ibuprofen, naproxen, or ketoprofen unless instructed by  your doctor. These medicines may hide a fever. Do not become pregnant while taking this medicine. Women should inform their doctor if they wish to become pregnant or think they might be pregnant. There is a potential for serious side effects to an unborn child. Talk to your health care professional or pharmacist for more information. Do not breast-feed an infant while taking this medicine. Men are advised not to father a child while receiving this  medicine. This product may contain alcohol. Ask your pharmacist or healthcare provider if this medicine contains alcohol. Be sure to tell all healthcare providers you are taking this medicine. Certain medicines, like metronidazole and disulfiram, can cause an unpleasant reaction when taken with alcohol. The reaction includes flushing, headache, nausea, vomiting, sweating, and increased thirst. The reaction can last from 30 minutes to several hours. What side effects may I notice from receiving this medication? Side effects that you should report to your doctor or health care professional as soon as possible: allergic reactions like skin rash, itching or hives, swelling of the face, lips, or tongue breathing problems changes in vision fast, irregular heartbeat high or low blood pressure mouth sores pain, tingling, numbness in the hands or feet signs of decreased platelets or bleeding - bruising, pinpoint red spots on the skin, black, tarry stools, blood in the urine signs of decreased red blood cells - unusually weak or tired, feeling faint or lightheaded, falls signs of infection - fever or chills, cough, sore throat, pain or difficulty passing urine signs and symptoms of liver injury like dark yellow or brown urine; general ill feeling or flu-like symptoms; light-colored stools; loss of appetite; nausea; right upper belly pain; unusually weak or tired; yellowing of the eyes or skin swelling of the ankles, feet, hands unusually slow heartbeat Side effects that usually do not require medical attention (report to your doctor or health care professional if they continue or are bothersome): diarrhea hair loss loss of appetite muscle or joint pain nausea, vomiting pain, redness, or irritation at site where injected tiredness This list may not describe all possible side effects. Call your doctor for medical advice about side effects. You may report side effects to FDA at 1-800-FDA-1088. Where  should I keep my medication? This drug is given in a hospital or clinic and will not be stored at home. NOTE: This sheet is a summary. It may not cover all possible information. If you have questions about this medicine, talk to your doctor, pharmacist, or health care provider.  2023 Elsevier/Gold Standard (2021-04-09 00:00:00) Carboplatin injection What is this medication? CARBOPLATIN (KAR boe pla tin) is a chemotherapy drug. It targets fast dividing cells, like cancer cells, and causes these cells to die. This medicine is used to treat ovarian cancer and many other cancers. This medicine may be used for other purposes; ask your health care provider or pharmacist if you have questions. COMMON BRAND NAME(S): Paraplatin What should I tell my care team before I take this medication? They need to know if you have any of these conditions: blood disorders hearing problems kidney disease recent or ongoing radiation therapy an unusual or allergic reaction to carboplatin, cisplatin, other chemotherapy, other medicines, foods, dyes, or preservatives pregnant or trying to get pregnant breast-feeding How should I use this medication? This drug is usually given as an infusion into a vein. It is administered in a hospital or clinic by a specially trained health care professional. Talk to your pediatrician regarding the use of this medicine in children.  Special care may be needed. Overdosage: If you think you have taken too much of this medicine contact a poison control center or emergency room at once. NOTE: This medicine is only for you. Do not share this medicine with others. What if I miss a dose? It is important not to miss a dose. Call your doctor or health care professional if you are unable to keep an appointment. What may interact with this medication? medicines for seizures medicines to increase blood counts like filgrastim, pegfilgrastim, sargramostim some antibiotics like amikacin,  gentamicin, neomycin, streptomycin, tobramycin vaccines Talk to your doctor or health care professional before taking any of these medicines: acetaminophen aspirin ibuprofen ketoprofen naproxen This list may not describe all possible interactions. Give your health care provider a list of all the medicines, herbs, non-prescription drugs, or dietary supplements you use. Also tell them if you smoke, drink alcohol, or use illegal drugs. Some items may interact with your medicine. What should I watch for while using this medication? Your condition will be monitored carefully while you are receiving this medicine. You will need important blood work done while you are taking this medicine. This drug may make you feel generally unwell. This is not uncommon, as chemotherapy can affect healthy cells as well as cancer cells. Report any side effects. Continue your course of treatment even though you feel ill unless your doctor tells you to stop. In some cases, you may be given additional medicines to help with side effects. Follow all directions for their use. Call your doctor or health care professional for advice if you get a fever, chills or sore throat, or other symptoms of a cold or flu. Do not treat yourself. This drug decreases your body's ability to fight infections. Try to avoid being around people who are sick. This medicine may increase your risk to bruise or bleed. Call your doctor or health care professional if you notice any unusual bleeding. Be careful brushing and flossing your teeth or using a toothpick because you may get an infection or bleed more easily. If you have any dental work done, tell your dentist you are receiving this medicine. Avoid taking products that contain aspirin, acetaminophen, ibuprofen, naproxen, or ketoprofen unless instructed by your doctor. These medicines may hide a fever. Do not become pregnant while taking this medicine. Women should inform their doctor if they wish  to become pregnant or think they might be pregnant. There is a potential for serious side effects to an unborn child. Talk to your health care professional or pharmacist for more information. Do not breast-feed an infant while taking this medicine. What side effects may I notice from receiving this medication? Side effects that you should report to your doctor or health care professional as soon as possible: allergic reactions like skin rash, itching or hives, swelling of the face, lips, or tongue signs of infection - fever or chills, cough, sore throat, pain or difficulty passing urine signs of decreased platelets or bleeding - bruising, pinpoint red spots on the skin, black, tarry stools, nosebleeds signs of decreased red blood cells - unusually weak or tired, fainting spells, lightheadedness breathing problems changes in hearing changes in vision chest pain high blood pressure low blood counts - This drug may decrease the number of white blood cells, red blood cells and platelets. You may be at increased risk for infections and bleeding. nausea and vomiting pain, swelling, redness or irritation at the injection site pain, tingling, numbness in the hands or feet problems  with balance, talking, walking trouble passing urine or change in the amount of urine Side effects that usually do not require medical attention (report to your doctor or health care professional if they continue or are bothersome): hair loss loss of appetite metallic taste in the mouth or changes in taste This list may not describe all possible side effects. Call your doctor for medical advice about side effects. You may report side effects to FDA at 1-800-FDA-1088. Where should I keep my medication? This drug is given in a hospital or clinic and will not be stored at home. NOTE: This sheet is a summary. It may not cover all possible information. If you have questions about this medicine, talk to your doctor, pharmacist,  or health care provider.  2023 Elsevier/Gold Standard (2007-10-17 00:00:00)

## 2021-10-20 ENCOUNTER — Ambulatory Visit
Admission: RE | Admit: 2021-10-20 | Discharge: 2021-10-20 | Disposition: A | Discharge status: Home / Self Care | Payer: Medicare HMO | Source: Ambulatory Visit | Attending: Radiation Oncology | Admitting: Radiation Oncology

## 2021-10-20 ENCOUNTER — Other Ambulatory Visit: Payer: Self-pay

## 2021-10-20 DIAGNOSIS — Z510 Encounter for antineoplastic radiation therapy: Secondary | ICD-10-CM | POA: Diagnosis not present

## 2021-10-20 DIAGNOSIS — R69 Illness, unspecified: Secondary | ICD-10-CM | POA: Diagnosis not present

## 2021-10-20 DIAGNOSIS — C3412 Malignant neoplasm of upper lobe, left bronchus or lung: Secondary | ICD-10-CM | POA: Diagnosis not present

## 2021-10-20 DIAGNOSIS — C3492 Malignant neoplasm of unspecified part of left bronchus or lung: Secondary | ICD-10-CM | POA: Diagnosis not present

## 2021-10-20 LAB — RAD ONC ARIA SESSION SUMMARY
Course Elapsed Days: 37
Plan Fractions Treated to Date: 26
Plan Prescribed Dose Per Fraction: 2 Gy
Plan Total Fractions Prescribed: 30
Plan Total Prescribed Dose: 60 Gy
Reference Point Dosage Given to Date: 52 Gy
Reference Point Session Dosage Given: 2 Gy
Session Number: 26

## 2021-10-21 ENCOUNTER — Ambulatory Visit
Admission: RE | Admit: 2021-10-21 | Discharge: 2021-10-21 | Disposition: A | Discharge status: Home / Self Care | Payer: Medicare HMO | Source: Ambulatory Visit | Attending: Radiation Oncology | Admitting: Radiation Oncology

## 2021-10-21 ENCOUNTER — Other Ambulatory Visit: Payer: Self-pay

## 2021-10-21 ENCOUNTER — Ambulatory Visit (HOSPITAL_COMMUNITY)
Admission: RE | Admit: 2021-10-21 | Discharge: 2021-10-21 | Disposition: A | Discharge status: Home / Self Care | Payer: Medicare HMO | Source: Ambulatory Visit | Attending: Radiation Oncology | Admitting: Radiation Oncology

## 2021-10-21 DIAGNOSIS — C7931 Secondary malignant neoplasm of brain: Secondary | ICD-10-CM | POA: Insufficient documentation

## 2021-10-21 DIAGNOSIS — Z510 Encounter for antineoplastic radiation therapy: Secondary | ICD-10-CM | POA: Diagnosis not present

## 2021-10-21 DIAGNOSIS — R69 Illness, unspecified: Secondary | ICD-10-CM | POA: Diagnosis not present

## 2021-10-21 DIAGNOSIS — C3492 Malignant neoplasm of unspecified part of left bronchus or lung: Secondary | ICD-10-CM | POA: Diagnosis present

## 2021-10-21 DIAGNOSIS — C3412 Malignant neoplasm of upper lobe, left bronchus or lung: Secondary | ICD-10-CM | POA: Diagnosis not present

## 2021-10-21 LAB — RAD ONC ARIA SESSION SUMMARY
Course Elapsed Days: 38
Plan Fractions Treated to Date: 27
Plan Prescribed Dose Per Fraction: 2 Gy
Plan Total Fractions Prescribed: 30
Plan Total Prescribed Dose: 60 Gy
Reference Point Dosage Given to Date: 54 Gy
Reference Point Session Dosage Given: 2 Gy
Session Number: 27

## 2021-10-21 MED ORDER — GADOBUTROL 1 MMOL/ML IV SOLN
10.0000 mL | Freq: Once | INTRAVENOUS | Status: AC | PRN
  Administered 2021-10-21: 10 mL via INTRAVENOUS

## 2021-10-22 ENCOUNTER — Other Ambulatory Visit: Payer: Self-pay

## 2021-10-22 ENCOUNTER — Ambulatory Visit
Admission: RE | Admit: 2021-10-22 | Discharge: 2021-10-22 | Disposition: A | Discharge status: Home / Self Care | Payer: Medicare HMO | Source: Ambulatory Visit | Attending: Radiation Oncology | Admitting: Radiation Oncology

## 2021-10-22 ENCOUNTER — Telehealth: Payer: Self-pay | Admitting: Internal Medicine

## 2021-10-22 DIAGNOSIS — C3492 Malignant neoplasm of unspecified part of left bronchus or lung: Secondary | ICD-10-CM | POA: Diagnosis not present

## 2021-10-22 DIAGNOSIS — R69 Illness, unspecified: Secondary | ICD-10-CM | POA: Diagnosis not present

## 2021-10-22 DIAGNOSIS — C3412 Malignant neoplasm of upper lobe, left bronchus or lung: Secondary | ICD-10-CM | POA: Diagnosis not present

## 2021-10-22 DIAGNOSIS — Z510 Encounter for antineoplastic radiation therapy: Secondary | ICD-10-CM | POA: Diagnosis not present

## 2021-10-22 LAB — RAD ONC ARIA SESSION SUMMARY
Course Elapsed Days: 39
Plan Fractions Treated to Date: 28
Plan Prescribed Dose Per Fraction: 2 Gy
Plan Total Fractions Prescribed: 30
Plan Total Prescribed Dose: 60 Gy
Reference Point Dosage Given to Date: 56 Gy
Reference Point Session Dosage Given: 2 Gy
Session Number: 28

## 2021-10-22 NOTE — Telephone Encounter (Signed)
Called patient regarding upcoming appointments, patient Is notified.

## 2021-10-23 DIAGNOSIS — R69 Illness, unspecified: Secondary | ICD-10-CM | POA: Diagnosis not present

## 2021-10-23 DIAGNOSIS — C3492 Malignant neoplasm of unspecified part of left bronchus or lung: Secondary | ICD-10-CM | POA: Diagnosis not present

## 2021-10-23 DIAGNOSIS — Z510 Encounter for antineoplastic radiation therapy: Secondary | ICD-10-CM | POA: Diagnosis not present

## 2021-10-23 DIAGNOSIS — C3412 Malignant neoplasm of upper lobe, left bronchus or lung: Secondary | ICD-10-CM | POA: Diagnosis not present

## 2021-10-25 ENCOUNTER — Telehealth: Payer: Self-pay | Admitting: Internal Medicine

## 2021-10-25 ENCOUNTER — Other Ambulatory Visit: Payer: Self-pay

## 2021-10-25 ENCOUNTER — Other Ambulatory Visit: Payer: Self-pay | Admitting: Radiation Therapy

## 2021-10-25 ENCOUNTER — Ambulatory Visit
Admission: RE | Admit: 2021-10-25 | Discharge: 2021-10-25 | Disposition: A | Discharge status: Home / Self Care | Payer: Medicare HMO | Source: Ambulatory Visit | Attending: Radiation Oncology | Admitting: Radiation Oncology

## 2021-10-25 ENCOUNTER — Inpatient Hospital Stay: Payer: Medicare HMO | Attending: Internal Medicine

## 2021-10-25 DIAGNOSIS — F1090 Alcohol use, unspecified, uncomplicated: Secondary | ICD-10-CM | POA: Insufficient documentation

## 2021-10-25 DIAGNOSIS — C3412 Malignant neoplasm of upper lobe, left bronchus or lung: Secondary | ICD-10-CM | POA: Insufficient documentation

## 2021-10-25 DIAGNOSIS — Z510 Encounter for antineoplastic radiation therapy: Secondary | ICD-10-CM | POA: Diagnosis not present

## 2021-10-25 DIAGNOSIS — C3492 Malignant neoplasm of unspecified part of left bronchus or lung: Secondary | ICD-10-CM | POA: Diagnosis not present

## 2021-10-25 DIAGNOSIS — R69 Illness, unspecified: Secondary | ICD-10-CM | POA: Diagnosis not present

## 2021-10-25 DIAGNOSIS — G969 Disorder of central nervous system, unspecified: Secondary | ICD-10-CM

## 2021-10-25 DIAGNOSIS — F17210 Nicotine dependence, cigarettes, uncomplicated: Secondary | ICD-10-CM | POA: Insufficient documentation

## 2021-10-25 DIAGNOSIS — Z5111 Encounter for antineoplastic chemotherapy: Secondary | ICD-10-CM | POA: Insufficient documentation

## 2021-10-25 DIAGNOSIS — I639 Cerebral infarction, unspecified: Secondary | ICD-10-CM | POA: Insufficient documentation

## 2021-10-25 LAB — RAD ONC ARIA SESSION SUMMARY
Course Elapsed Days: 42
Plan Fractions Treated to Date: 29
Plan Prescribed Dose Per Fraction: 2 Gy
Plan Total Fractions Prescribed: 30
Plan Total Prescribed Dose: 60 Gy
Reference Point Dosage Given to Date: 58 Gy
Reference Point Session Dosage Given: 2 Gy
Session Number: 29

## 2021-10-25 NOTE — Telephone Encounter (Signed)
Scheduled appt per 6/5 referral. Pt is aware of appt date and time. Pt is aware to arrive 15 mins prior to appt time and to bring and updated insurance card. Pt is aware of appt location.

## 2021-10-26 ENCOUNTER — Inpatient Hospital Stay: Payer: Medicare HMO

## 2021-10-26 ENCOUNTER — Ambulatory Visit
Admission: RE | Admit: 2021-10-26 | Discharge: 2021-10-26 | Disposition: A | Discharge status: Home / Self Care | Payer: Medicare HMO | Source: Ambulatory Visit | Attending: Radiation Oncology | Admitting: Radiation Oncology

## 2021-10-26 ENCOUNTER — Other Ambulatory Visit: Payer: Medicare HMO

## 2021-10-26 ENCOUNTER — Other Ambulatory Visit: Payer: Self-pay

## 2021-10-26 ENCOUNTER — Ambulatory Visit: Payer: Medicare HMO | Admitting: Internal Medicine

## 2021-10-26 VITALS — BP 109/73 | HR 77 | Temp 98.0°F | Resp 20 | Wt 263.5 lb

## 2021-10-26 DIAGNOSIS — R69 Illness, unspecified: Secondary | ICD-10-CM | POA: Diagnosis not present

## 2021-10-26 DIAGNOSIS — C3492 Malignant neoplasm of unspecified part of left bronchus or lung: Secondary | ICD-10-CM

## 2021-10-26 DIAGNOSIS — Z510 Encounter for antineoplastic radiation therapy: Secondary | ICD-10-CM | POA: Diagnosis not present

## 2021-10-26 DIAGNOSIS — C3412 Malignant neoplasm of upper lobe, left bronchus or lung: Secondary | ICD-10-CM | POA: Diagnosis not present

## 2021-10-26 DIAGNOSIS — F1090 Alcohol use, unspecified, uncomplicated: Secondary | ICD-10-CM | POA: Diagnosis not present

## 2021-10-26 DIAGNOSIS — F17210 Nicotine dependence, cigarettes, uncomplicated: Secondary | ICD-10-CM | POA: Diagnosis not present

## 2021-10-26 DIAGNOSIS — Z5111 Encounter for antineoplastic chemotherapy: Secondary | ICD-10-CM | POA: Diagnosis not present

## 2021-10-26 DIAGNOSIS — I639 Cerebral infarction, unspecified: Secondary | ICD-10-CM | POA: Diagnosis not present

## 2021-10-26 LAB — CBC WITH DIFFERENTIAL (CANCER CENTER ONLY)
Abs Immature Granulocytes: 0.03 10*3/uL (ref 0.00–0.07)
Basophils Absolute: 0 10*3/uL (ref 0.0–0.1)
Basophils Relative: 0 %
Eosinophils Absolute: 0 10*3/uL (ref 0.0–0.5)
Eosinophils Relative: 0 %
HCT: 31.8 % — ABNORMAL LOW (ref 39.0–52.0)
Hemoglobin: 11 g/dL — ABNORMAL LOW (ref 13.0–17.0)
Immature Granulocytes: 1 %
Lymphocytes Relative: 14 %
Lymphs Abs: 0.5 10*3/uL — ABNORMAL LOW (ref 0.7–4.0)
MCH: 34.5 pg — ABNORMAL HIGH (ref 26.0–34.0)
MCHC: 34.6 g/dL (ref 30.0–36.0)
MCV: 99.7 fL (ref 80.0–100.0)
Monocytes Absolute: 0.4 10*3/uL (ref 0.1–1.0)
Monocytes Relative: 12 %
Neutro Abs: 2.3 10*3/uL (ref 1.7–7.7)
Neutrophils Relative %: 73 %
Platelet Count: 130 10*3/uL — ABNORMAL LOW (ref 150–400)
RBC: 3.19 MIL/uL — ABNORMAL LOW (ref 4.22–5.81)
RDW: 15.8 % — ABNORMAL HIGH (ref 11.5–15.5)
WBC Count: 3.2 10*3/uL — ABNORMAL LOW (ref 4.0–10.5)
nRBC: 0.6 % — ABNORMAL HIGH (ref 0.0–0.2)

## 2021-10-26 LAB — CMP (CANCER CENTER ONLY)
ALT: 22 U/L (ref 0–44)
AST: 19 U/L (ref 15–41)
Albumin: 3.8 g/dL (ref 3.5–5.0)
Alkaline Phosphatase: 73 U/L (ref 38–126)
Anion gap: 5 (ref 5–15)
BUN: 19 mg/dL (ref 8–23)
CO2: 33 mmol/L — ABNORMAL HIGH (ref 22–32)
Calcium: 9.5 mg/dL (ref 8.9–10.3)
Chloride: 100 mmol/L (ref 98–111)
Creatinine: 0.9 mg/dL (ref 0.61–1.24)
GFR, Estimated: >60 mL/min (ref >60)
Glucose, Bld: 205 mg/dL — ABNORMAL HIGH (ref 70–99)
Potassium: 4 mmol/L (ref 3.5–5.1)
Sodium: 138 mmol/L (ref 135–145)
Total Bilirubin: 0.5 mg/dL (ref 0.3–1.2)
Total Protein: 6.6 g/dL (ref 6.5–8.1)

## 2021-10-26 LAB — RAD ONC ARIA SESSION SUMMARY
Course Elapsed Days: 43
Plan Fractions Treated to Date: 30
Plan Prescribed Dose Per Fraction: 2 Gy
Plan Total Fractions Prescribed: 30
Plan Total Prescribed Dose: 60 Gy
Reference Point Dosage Given to Date: 60 Gy
Reference Point Session Dosage Given: 2 Gy
Session Number: 30

## 2021-10-26 MED ORDER — FAMOTIDINE IN NACL 20-0.9 MG/50ML-% IV SOLN
20.0000 mg | Freq: Once | INTRAVENOUS | Status: AC
  Administered 2021-10-26: 20 mg via INTRAVENOUS
  Filled 2021-10-26: qty 50

## 2021-10-26 MED ORDER — SODIUM CHLORIDE 0.9 % IV SOLN: Freq: Once | INTRAVENOUS | Status: AC

## 2021-10-26 MED ORDER — DIPHENHYDRAMINE HCL 50 MG/ML IJ SOLN
50.0000 mg | Freq: Once | INTRAMUSCULAR | Status: AC
  Administered 2021-10-26: 50 mg via INTRAVENOUS
  Filled 2021-10-26: qty 1

## 2021-10-26 MED ORDER — METHYLPREDNISOLONE SODIUM SUCC 125 MG IJ SOLR
125.0000 mg | Freq: Once | INTRAMUSCULAR | Status: AC
  Administered 2021-10-26: 125 mg via INTRAVENOUS
  Filled 2021-10-26: qty 2

## 2021-10-26 MED ORDER — SODIUM CHLORIDE 0.9 % IV SOLN
284.8000 mg | Freq: Once | INTRAVENOUS | Status: AC
  Administered 2021-10-26: 280 mg via INTRAVENOUS
  Filled 2021-10-26: qty 28

## 2021-10-26 MED ORDER — PALONOSETRON HCL INJECTION 0.25 MG/5ML
0.2500 mg | Freq: Once | INTRAVENOUS | Status: AC
  Administered 2021-10-26: 0.25 mg via INTRAVENOUS
  Filled 2021-10-26: qty 5

## 2021-10-26 MED ORDER — SODIUM CHLORIDE 0.9 % IV SOLN
45.0000 mg/m2 | Freq: Once | INTRAVENOUS | Status: AC
  Administered 2021-10-26: 114 mg via INTRAVENOUS
  Filled 2021-10-26: qty 19

## 2021-10-26 NOTE — Patient Instructions (Addendum)
[facility]   Discharge Instructions: Thank you for choosing [facility] to provide your oncology and hematology care.   If you have a lab appointment with the Cancer Center, please go directly to the Cancer Center and check in at the registration area.   Wear comfortable clothing and clothing appropriate for easy access to any Portacath or PICC line.   We strive to give you quality time with your provider. You may need to reschedule your appointment if you arrive late (15 or more minutes).  Arriving late affects you and other patients whose appointments are after yours.  Also, if you miss three or more appointments without notifying the office, you may be dismissed from the clinic at the provider's discretion.      For prescription refill requests, have your pharmacy contact our office and allow 72 hours for refills to be completed.    Today you received the following chemotherapy and/or immunotherapy agents: paclitaxel and carboplatin.      To help prevent nausea and vomiting after your treatment, we encourage you to take your nausea medication as directed.  BELOW ARE SYMPTOMS THAT SHOULD BE REPORTED IMMEDIATELY: *FEVER GREATER THAN 100.4 F (38 C) OR HIGHER *CHILLS OR SWEATING *NAUSEA AND VOMITING THAT IS NOT CONTROLLED WITH YOUR NAUSEA MEDICATION *UNUSUAL SHORTNESS OF BREATH *UNUSUAL BRUISING OR BLEEDING *URINARY PROBLEMS (pain or burning when urinating, or frequent urination) *BOWEL PROBLEMS (unusual diarrhea, constipation, pain near the anus) TENDERNESS IN MOUTH AND THROAT WITH OR WITHOUT PRESENCE OF ULCERS (sore throat, sores in mouth, or a toothache) UNUSUAL RASH, SWELLING OR PAIN  UNUSUAL VAGINAL DISCHARGE OR ITCHING   Items with * indicate a potential emergency and should be followed up as soon as possible or go to the Emergency Department if any problems should occur.  Please show the CHEMOTHERAPY ALERT CARD or IMMUNOTHERAPY ALERT  CARD at check-in to the Emergency Department and triage nurse.  Should you have questions after your visit or need to cancel or reschedule your appointment, please contact [facility]  Dept: [phone]  and follow the prompts.  Office hours are 8:00 a.m. to 4:30 p.m. Monday - Friday. Please note that voicemails left after 4:00 p.m. may not be returned until the following business day.  We are closed weekends and major holidays. You have access to a nurse at all times for urgent questions. Please call the main number to the clinic Dept: [phone] and follow the prompts.   For any non-urgent questions, you may also contact your provider using MyChart. We now offer e-Visits for anyone 18 and older to request care online for non-urgent symptoms. For details visit [email or web address].   Also download the MyChart app! Go to the app store, search "MyChart", open the app, select [facility], and log in with your MyChart username and password.  Due to Covid, a mask is required upon entering the hospital/clinic. If you do not have a mask, one will be given to you upon arrival. For doctor visits, patients may have 1 support person aged 18 or older with them. For treatment visits, patients cannot have anyone with them due to current Covid guidelines and our immunocompromised population.   

## 2021-10-27 ENCOUNTER — Other Ambulatory Visit: Payer: Self-pay

## 2021-10-27 ENCOUNTER — Ambulatory Visit: Payer: Medicare HMO

## 2021-10-27 ENCOUNTER — Ambulatory Visit
Admission: RE | Admit: 2021-10-27 | Discharge: 2021-10-27 | Disposition: A | Discharge status: Home / Self Care | Payer: Medicare HMO | Source: Ambulatory Visit | Attending: Radiation Oncology | Admitting: Radiation Oncology

## 2021-10-27 DIAGNOSIS — C3492 Malignant neoplasm of unspecified part of left bronchus or lung: Secondary | ICD-10-CM | POA: Diagnosis not present

## 2021-10-27 DIAGNOSIS — R69 Illness, unspecified: Secondary | ICD-10-CM | POA: Diagnosis not present

## 2021-10-27 DIAGNOSIS — C3412 Malignant neoplasm of upper lobe, left bronchus or lung: Secondary | ICD-10-CM | POA: Diagnosis not present

## 2021-10-27 DIAGNOSIS — Z510 Encounter for antineoplastic radiation therapy: Secondary | ICD-10-CM | POA: Diagnosis not present

## 2021-10-27 LAB — RAD ONC ARIA SESSION SUMMARY
Course Elapsed Days: 44
Plan Fractions Treated to Date: 1
Plan Prescribed Dose Per Fraction: 2 Gy
Plan Total Fractions Prescribed: 3
Plan Total Prescribed Dose: 6 Gy
Reference Point Dosage Given to Date: 62 Gy
Reference Point Session Dosage Given: 2 Gy
Session Number: 31

## 2021-10-28 ENCOUNTER — Other Ambulatory Visit: Payer: Self-pay

## 2021-10-28 ENCOUNTER — Ambulatory Visit: Payer: Medicare HMO

## 2021-10-28 ENCOUNTER — Ambulatory Visit
Admission: RE | Admit: 2021-10-28 | Discharge: 2021-10-28 | Disposition: A | Discharge status: Home / Self Care | Payer: Medicare HMO | Source: Ambulatory Visit | Attending: Radiation Oncology | Admitting: Radiation Oncology

## 2021-10-28 DIAGNOSIS — C3492 Malignant neoplasm of unspecified part of left bronchus or lung: Secondary | ICD-10-CM | POA: Diagnosis not present

## 2021-10-28 LAB — RAD ONC ARIA SESSION SUMMARY
Course Elapsed Days: 45
Plan Fractions Treated to Date: 2
Plan Prescribed Dose Per Fraction: 2 Gy
Plan Total Fractions Prescribed: 3
Plan Total Prescribed Dose: 6 Gy
Reference Point Dosage Given to Date: 64 Gy
Reference Point Session Dosage Given: 2 Gy
Session Number: 32

## 2021-10-29 ENCOUNTER — Other Ambulatory Visit: Payer: Self-pay

## 2021-10-29 ENCOUNTER — Encounter: Payer: Self-pay | Admitting: Radiation Oncology

## 2021-10-29 ENCOUNTER — Ambulatory Visit
Admission: RE | Admit: 2021-10-29 | Discharge: 2021-10-29 | Disposition: A | Discharge status: Home / Self Care | Payer: Medicare HMO | Source: Ambulatory Visit | Attending: Radiation Oncology | Admitting: Radiation Oncology

## 2021-10-29 DIAGNOSIS — R69 Illness, unspecified: Secondary | ICD-10-CM | POA: Diagnosis not present

## 2021-10-29 DIAGNOSIS — C3412 Malignant neoplasm of upper lobe, left bronchus or lung: Secondary | ICD-10-CM | POA: Diagnosis not present

## 2021-10-29 DIAGNOSIS — Z510 Encounter for antineoplastic radiation therapy: Secondary | ICD-10-CM | POA: Diagnosis not present

## 2021-10-29 DIAGNOSIS — C3492 Malignant neoplasm of unspecified part of left bronchus or lung: Secondary | ICD-10-CM | POA: Diagnosis not present

## 2021-10-29 LAB — RAD ONC ARIA SESSION SUMMARY
Course Elapsed Days: 46
Plan Fractions Treated to Date: 3
Plan Prescribed Dose Per Fraction: 2 Gy
Plan Total Fractions Prescribed: 3
Plan Total Prescribed Dose: 6 Gy
Reference Point Dosage Given to Date: 66 Gy
Reference Point Session Dosage Given: 2 Gy
Session Number: 33

## 2021-11-01 ENCOUNTER — Encounter: Payer: Self-pay | Admitting: Internal Medicine

## 2021-11-01 ENCOUNTER — Inpatient Hospital Stay (HOSPITAL_BASED_OUTPATIENT_CLINIC_OR_DEPARTMENT_OTHER): Payer: Medicare HMO | Admitting: Internal Medicine

## 2021-11-01 ENCOUNTER — Other Ambulatory Visit: Payer: Self-pay

## 2021-11-01 VITALS — BP 107/67 | HR 97 | Temp 97.9°F | Resp 17 | Wt 267.5 lb

## 2021-11-01 DIAGNOSIS — R9089 Other abnormal findings on diagnostic imaging of central nervous system: Secondary | ICD-10-CM | POA: Diagnosis not present

## 2021-11-01 DIAGNOSIS — Z5111 Encounter for antineoplastic chemotherapy: Secondary | ICD-10-CM | POA: Diagnosis not present

## 2021-11-01 DIAGNOSIS — I639 Cerebral infarction, unspecified: Secondary | ICD-10-CM

## 2021-11-01 DIAGNOSIS — C3412 Malignant neoplasm of upper lobe, left bronchus or lung: Secondary | ICD-10-CM | POA: Diagnosis not present

## 2021-11-01 DIAGNOSIS — R69 Illness, unspecified: Secondary | ICD-10-CM | POA: Diagnosis not present

## 2021-11-01 NOTE — Progress Notes (Signed)
                                                                                                                                                             Patient Name: [name] MRN: [id number] DOB: [birth year] Referring Physician: June Leap Date of Service: 10/29/2021 [facility], [place]                                                        End Of Treatment Note  Diagnoses: C34.12-Malignant neoplasm of upper lobe, left bronchus or lung  Cancer Staging: Stage IIIB, cT3N2M0, NSCLC, squamous cell carcinoma of the LUL  Intent: Curative  Radiation Treatment Dates: 09/13/2021 through 10/29/2021 Site Technique Total Dose (Gy) Dose per Fx (Gy) Completed Fx Beam Energies  Lung, Left: Lung_L 3D 60/60 2 30/30 6X  Lung, Left: Lung_L_Bst 3D 6/6 2 3/3 6X   Narrative: The patient tolerated radiation therapy relatively well. He developed fatigue and anticipated skin changes in the treatment field as well was esophagitis but did not require medication to manage his comfort with eating.   Plan: The patient will receive a call in about one month from the radiation oncology department. He will continue follow up with Dr. Julien Nordmann as well.   ________________________________________________    Carola Rhine, [facility]

## 2021-11-01 NOTE — Progress Notes (Signed)
[facility] at [facility] [place], [place] [address] [phone]   New Patient Evaluation  Date of Service: 11/01/21 Patient Name: [name] Patient MRN: [id number] Patient DOB: June 14, 1955 Provider: Ventura Sellers, MD  Identifying Statement:  [name] is a 66 y.o. male with Abnormal brain MRI - Plan: MR BRAIN W WO CONTRAST  Cerebrovascular accident (CVA), unspecified mechanism ([name]) who presents for initial consultation and evaluation regarding cancer associated neurologic deficits.    Referring Provider: Kyung Rudd, MD [address] [place],   [id number]  Primary Cancer:  Oncologic History: Oncology History  Stage III squamous cell carcinoma of left lung ([place])  09/01/2021 Initial Diagnosis   Stage III squamous cell carcinoma of left lung ([place])   09/13/2021 -  Chemotherapy   Patient is on Treatment Plan : LUNG Carboplatin / Paclitaxel + XRT q7d       History of Present Illness: The patient's records from the referring physician were obtained and reviewed and the patient interviewed to confirm this HPI.  [name] presents today for evaluation for abnormal brain MRI.  [name] obtained a screening brain MRI 1 month ago with new diagnosis of non small cell lung cancer.  Scan demonstrated three abnormal foci of uncertain etiology; none of these were symptomatic in any way.  We then recommended follow up MRI 1 month later, which [name] obtained this week.  [name] has recently completed chemoradiation for stage 3 lung cancer with [name].  Medications: Current Outpatient Medications on File Prior to Visit  Medication Sig Dispense Refill   acetaminophen (TYLENOL) 325 MG tablet Take 2 tablets (650 mg total) by mouth every 6 (six) hours as needed for mild pain (or Fever >/= 101). 12 tablet 0   apixaban (ELIQUIS) 5 MG TABS tablet Take 1 tablet (5 mg total) by mouth 2 (two) times daily. 60 tablet 2   atorvastatin (LIPITOR)  20 MG tablet Take 1 tablet (20 mg total) by mouth every evening. 30 tablet 3   bisoprolol (ZEBETA) 5 MG tablet Take 1 tablet (5 mg total) by mouth daily. For BP and Heart Rate Control 30 tablet 5   Budeson-Glycopyrrol-Formoterol (BREZTRI AEROSPHERE) 160-9-4.8 MCG/ACT AERO Inhale 2 puffs into the lungs in the morning and at bedtime. 10.7 g 0   glimepiride (AMARYL) 2 MG tablet Take 1 tablet (2 mg total) by mouth every morning. 30 tablet 11   Multiple Vitamin (MULTI-VITAMIN DAILY PO) Take 1 tablet by mouth daily.     potassium chloride (KLOR-CON) 10 MEQ tablet Take 1 tablet (10 mEq total) by mouth daily. Take While taking Lasix/furosemide 30 tablet 2   torsemide 40 MG TABS Take 40 mg by mouth every morning. 30 tablet 2   albuterol (PROVENTIL) (2.5 MG/3ML) 0.083% nebulizer solution Take 3 mLs by nebulization every 6 (six) hours as needed for wheezing or shortness of breath. (Patient not taking: Reported on 09/01/2021) 75 mL 12   albuterol (VENTOLIN HFA) 108 (90 Base) MCG/ACT inhaler Inhale 2 puffs into the lungs every 6 (six) hours as needed for wheezing or shortness of breath. (Patient not taking: Reported on 09/01/2021) 18 g 2   prochlorperazine (COMPAZINE) 10 MG tablet Take 1 tablet (10 mg total) by mouth every 6 (six) hours as needed for nausea or vomiting. (Patient not taking: Reported on 11/01/2021) 30 tablet 0   No current facility-administered medications on file prior to visit.    Allergies:  Allergies  Allergen  Reactions   Paclitaxel Shortness Of Breath    Hypersensitivity reaction. See progress note dated 09/27/2021.   Penicillins Other (See Comments)    Childhood Allergy  Has patient had a PCN reaction causing immediate rash, facial/tongue/throat swelling, SOB or lightheadedness with hypotension: No Has patient had a PCN reaction causing severe rash involving mucus membranes or skin necrosis: No Has patient had a PCN reaction that required hospitalization: No Has patient had a PCN reaction  occurring within the last 10 years: No If all of the above answers are "NO", then may proceed with Cephalosporin use.    Past Medical History:  Past Medical History:  Diagnosis Date   Atrial flutter ([place])    s/p DCCV 06/03/21   CHF (congestive heart failure) ([place])    Colon cancer ([facility]) 04/22/2013   Hypertension    Lung cancer ([place]) 08/26/2021   Renal disorder    Kidney stones   Past Surgical History:  Past Surgical History:  Procedure Laterality Date   BRONCHIAL BIOPSY  08/26/2021   Procedure: BRONCHIAL BIOPSIES;  Surgeon: Cameron Nash, DO;  Location: [facility];  Service: Pulmonary;;   BRONCHIAL BRUSHINGS  08/26/2021   Procedure: BRONCHIAL BRUSHINGS;  Surgeon: Cameron Nash, DO;  Location: [facility];  Service: Pulmonary;;   BRONCHIAL NEEDLE ASPIRATION BIOPSY  08/26/2021   Procedure: BRONCHIAL NEEDLE ASPIRATION BIOPSIES;  Surgeon: Cameron Nash, DO;  Location: [facility];  Service: Pulmonary;;   CARDIOVERSION N/A 06/03/2021   Procedure: CARDIOVERSION;  Surgeon: Cameron Sark, MD;  Location: [facility];  Service: Cardiovascular;  Laterality: N/A;   COLONOSCOPY N/A 04/29/2013   Procedure: COLONOSCOPY;  Surgeon: Cameron Binder, MD;  [facility];  Service: Endoscopy;  Laterality: N/A;  10:30AM   Cyst removed from left elbow     ENDOVENOUS ABLATION SAPHENOUS VEIN W/ LASER Left 01/16/2019   endovenous laser ablation left greater saphenous vein by Cameron Hinds MD   KIDNEY STONE SURGERY     LITHOTRIPSY     PARTIAL COLECTOMY     TEE WITHOUT CARDIOVERSION N/A 06/03/2021   Procedure: TRANSESOPHAGEAL ECHOCARDIOGRAM (TEE);  Surgeon: Cameron Sark, MD;  Location: [facility];  Service: Cardiovascular;  Laterality: N/A;   VIDEO BRONCHOSCOPY WITH ENDOBRONCHIAL ULTRASOUND Bilateral 08/26/2021   Procedure: VIDEO BRONCHOSCOPY WITH ENDOBRONCHIAL ULTRASOUND;  Surgeon: Cameron Nash, DO;  Location: [facility];  Service: Pulmonary;  Laterality: Bilateral;  will need Guardant  [id number]   Social History:  Social History   Socioeconomic History   Marital status: Married    Spouse name: Not on file   Number of children: Not on file   Years of education: Not on file   Highest education level: Not on file  Occupational History   Not on file  Tobacco Use   Smoking status: Every Day    Packs/day: 0.50    Years: 20.00    Total pack years: 10.00    Types: Cigarettes   Smokeless tobacco: Never   Tobacco comments:    1 pack a day MRC 08/24/21  Vaping Use   Vaping Use: Never used  Substance and Sexual Activity   Alcohol use: Yes    Alcohol/week: 12.0 standard drinks of alcohol    Types: 6 Cans of beer, 6 Shots of liquor per week    Comment: couple mixed drinks at night as of 05/31/21   Drug use: No   Sexual activity: Not on file  Other Topics Concern   Not on file  Social History  Narrative   Not on file   Social Determinants of Health   Financial Resource Strain: Not on file  Food Insecurity: Not on file  Transportation Needs: Not on file  Physical Activity: Not on file  Stress: Not on file  Social Connections: Not on file  Intimate Partner Violence: Not on file   Family History:  Family History  Problem Relation Age of Onset   Cancer Mother        pancreatic   Colon cancer Neg Hx     Review of Systems: Constitutional: Doesn't report fevers, chills or abnormal weight loss Eyes: Doesn't report blurriness of vision Ears, nose, mouth, throat, and face: Doesn't report sore throat Respiratory: Doesn't report cough, dyspnea or wheezes Cardiovascular: Doesn't report palpitation, chest discomfort  Gastrointestinal:  Doesn't report nausea, constipation, diarrhea GU: Doesn't report incontinence Skin: Doesn't report skin rashes Neurological: Per HPI Musculoskeletal: Doesn't report joint pain Behavioral/Psych: Doesn't report anxiety  Physical Exam: Vitals:   11/01/21 1356  BP: 107/67  Pulse: 97  Resp: 17  Temp: 97.9 F (36.6 C)  SpO2: 92%    KPS: 90. General: Alert, cooperative, pleasant, in no acute distress Head: Normal EENT: No conjunctival injection or scleral icterus.  Lungs: Resp effort normal Cardiac: Regular rate Abdomen: Non-distended abdomen Skin: No rashes cyanosis or petechiae. Extremities: No clubbing or edema  Neurologic Exam: Mental Status: Awake, alert, attentive to examiner. Oriented to self and environment. Language is fluent with intact comprehension.  Cranial Nerves: Visual acuity is grossly normal. Visual fields are full. Extra-ocular movements intact. No ptosis. Face is symmetric Motor: Tone and bulk are normal. Power is full in both arms and legs. Reflexes are symmetric, no pathologic reflexes present.  Sensory: Intact to light touch Gait: Normal.   Labs: I have reviewed the data as listed    Component Value Date/Time   NA 138 10/26/2021 0928   K 4.0 10/26/2021 0928   CL 100 10/26/2021 0928   CO2 33 (H) 10/26/2021 0928   GLUCOSE 205 (H) 10/26/2021 0928   BUN 19 10/26/2021 0928   CREATININE 0.90 10/26/2021 0928   CALCIUM 9.5 10/26/2021 0928   PROT 6.6 10/26/2021 0928   ALBUMIN 3.8 10/26/2021 0928   AST 19 10/26/2021 0928   ALT 22 10/26/2021 0928   ALKPHOS 73 10/26/2021 0928   BILITOT 0.5 10/26/2021 0928   GFRNONAA >60 10/26/2021 0928   GFRAA >60 02/02/2018 0507   Lab Results  Component Value Date   WBC 3.2 (L) 10/26/2021   NEUTROABS 2.3 10/26/2021   HGB 11.0 (L) 10/26/2021   HCT 31.8 (L) 10/26/2021   MCV 99.7 10/26/2021   PLT 130 (L) 10/26/2021    Imaging:  MR Brain W Wo Contrast  Result Date: 10/22/2021 CLINICAL DATA:  Evaluate brain metastases EXAM: MRI HEAD WITHOUT AND WITH CONTRAST TECHNIQUE: Multiplanar, multiecho pulse sequences of the brain and surrounding structures were obtained without and with intravenous contrast. CONTRAST:  58mL GADAVIST GADOBUTROL 1 MMOL/ML IV SOLN COMPARISON:  Brain MRI 09/22/2021 FINDINGS: Brain: Again seen is curvilinear DWI signal in the  right parietal lobe with associated SWI signal dropout, unchanged compared to the study from 09/22/2021. Is no postcontrast enhancement on the T1 postcontrast black blood sequence or on the sagittal T1 postcontrast. There is T2 hyperintensity in the left peritrigonal white matter in the region of the focus of DWI signal seen on the study from 09/22/2021. The previously seen diffusion restriction has significantly decreased. There is no postcontrast enhancement. No enhancing lesions  are seen. Is no evidence of acute infarct, intracranial hemorrhage, or extra-axial fluid collection. Parenchymal volume is normal. The ventricles are normal in size. Patchy FLAIR signal abnormality throughout the subcortical and periventricular white matter likely reflects sequela of background chronic white matter microangiopathy. There is no mass effect or midline shift. Vascular: Normal flow voids. Skull and upper cervical spine: Normal marrow signal. Sinuses/Orbits: The paranasal sinuses are clear. The globes and orbits are unremarkable. Other: None. IMPRESSION: 1. Persistent curvilinear DWI signal abnormality and SWI signal dropout in the right parietal lobe but without postcontrast enhancement. This finding is favored to reflect chronic blood products which may be related to a remote cortical infarct. 2. Decreased DWI signal in the left peritrigonal white matter with persistent T2 signal abnormality favored to reflect evolving subacute to chronic infarct. 3. No convincing evidence of metastatic disease. Electronically Signed   By: Valetta Mole M.D.   On: 10/22/2021 17:14    [place] Clinician Interpretation: I have personally reviewed the radiological images as listed.  My interpretation, in the context of the patient's clinical presentation, is  vascular phenomena   Assessment/Plan Abnormal brain MRI - Plan: MR BRAIN W WO CONTRAST  Cerebrovascular accident (CVA), unspecified mechanism ([place])  [name] presents with  radiographic syndrome most consistent with vascular/stroke etiology.  Two of the lesions were DWI+, neither progressed in neoplastic fashion over 1 month.  The third lesion was enhancing without DWI signal; this resolved on second MRI.  Residual DWI signal and susceptibility within right parietal lesion are likely consistent with small amount of chronic blood product.  There is no sign of any active bleeding.    Etiology for stroke is likely cardio-embolic, given relatively recent diagnosis of atrial flutter.  [name] is now anticoagulated with Eliquis, tolerating it well.  [name] will be at high risk for stroke if anticoagulation need to be held.    For transient enhancing lesion, it is possible this represents a small metastasis that was impacted by blood brain barrier penetration of carboplatin.    To verify no recurrence outside of systemic therapy, we recommended repeating brain MRI study in 6 months. [name] is agreeable with this plan.  We spent twenty additional minutes teaching regarding the natural history, biology, and historical experience in the treatment of neurologic complications of cancer.   We appreciate the opportunity to participate in the care of [name].   We ask that [name] return to clinic in 6 months following next brain MRI, or sooner as needed.  All questions were answered. The patient knows to call the clinic with any problems, questions or concerns. No barriers to learning were detected.  The total time spent in the encounter was 40 minutes and more than 50% was on counseling and review of test results   Cameron Sellers, MD Medical Director of Neuro-Oncology [facility] 11/01/21 3:57 PM

## 2021-11-02 ENCOUNTER — Telehealth: Payer: Self-pay | Admitting: Internal Medicine

## 2021-11-02 NOTE — Telephone Encounter (Signed)
Per 6/12 los called and spoke to pt about appointment.  Pt confirmed appointment

## 2021-11-04 ENCOUNTER — Other Ambulatory Visit: Payer: Self-pay | Admitting: Radiation Therapy

## 2021-11-08 DIAGNOSIS — J9601 Acute respiratory failure with hypoxia: Secondary | ICD-10-CM | POA: Diagnosis not present

## 2021-11-08 DIAGNOSIS — I5022 Chronic systolic (congestive) heart failure: Secondary | ICD-10-CM | POA: Diagnosis not present

## 2021-11-11 ENCOUNTER — Encounter: Payer: Self-pay | Admitting: *Deleted

## 2021-11-11 NOTE — Progress Notes (Signed)
I followed up on Mr. [name] treatment plan. He is scheduled for a CT scan and follow up with Dr. Julien Nordmann in the next few weeks.

## 2021-11-18 ENCOUNTER — Ambulatory Visit (HOSPITAL_COMMUNITY)
Admission: RE | Admit: 2021-11-18 | Discharge: 2021-11-18 | Disposition: A | Discharge status: Home / Self Care | Payer: Medicare HMO | Source: Ambulatory Visit | Attending: Physician Assistant | Admitting: Physician Assistant

## 2021-11-18 DIAGNOSIS — C3490 Malignant neoplasm of unspecified part of unspecified bronchus or lung: Secondary | ICD-10-CM | POA: Diagnosis not present

## 2021-11-18 DIAGNOSIS — C3492 Malignant neoplasm of unspecified part of left bronchus or lung: Secondary | ICD-10-CM | POA: Insufficient documentation

## 2021-11-18 DIAGNOSIS — R918 Other nonspecific abnormal finding of lung field: Secondary | ICD-10-CM | POA: Diagnosis not present

## 2021-11-18 DIAGNOSIS — I700 Atherosclerosis of aorta: Secondary | ICD-10-CM | POA: Diagnosis not present

## 2021-11-18 DIAGNOSIS — J439 Emphysema, unspecified: Secondary | ICD-10-CM | POA: Diagnosis not present

## 2021-11-18 MED ORDER — IOHEXOL 300 MG/ML  SOLN
75.0000 mL | Freq: Once | INTRAMUSCULAR | Status: AC | PRN
  Administered 2021-11-18: 75 mL via INTRAVENOUS

## 2021-11-18 MED ORDER — SODIUM CHLORIDE (PF) 0.9 % IJ SOLN
INTRAMUSCULAR | Status: AC
  Filled 2021-11-18: qty 50

## 2021-11-21 NOTE — Progress Notes (Signed)
[name], male    DOB: Apr 04, 1956,     MRN: [id number]   Brief patient profile:  38  yowm  active smoker /MM  referred to pulmonary clinic in [place]  07/08/2021 by Domenic Polite  for copd eval with onset 2018   1/0/23    History of Present Illness  07/08/2021  Pulmonary/ 1st office eval/ Shine Mikes / [place] [facility]  Chief Complaint  Patient presents with   Consult    Self referral for what patient thought was COPD and it was Afib.  Patient using oxygen at home prn.   Dyspnea:  walking around the yard x 5 min / crosses parking lots/ [employer] pushing cart not using 02 much with exertion Cough: minimal /no am flare or purulent sputum  Sleep: bed flat/ one pillow on 2lpm  SABA use:  has symbicort breztri albuterol listed and not clear how he really uses them 02  2lpm hs and prn  Rec Plan A = Automatic = Always=    Breztri Take 2 puffs first thing in am and then another 2 puffs about 12 hours later.   Work on inhaler technique:  Remember to warm up like a golfer Plan B = Backup (to supplement plan A, not to replace it) Only use your albuterol inhaler as a rescue medication  Plan C = Crisis (instead of Plan B but only if Plan B stops working) - only use your albuterol nebulizer if you first try Plan B and it fails to help > ok to use the nebulizer up to every 4 hours but if start needing it regularly call for immediate appointment Make sure you check your oxygen saturation  AT  your highest level of activity (not after you stop)   to be sure it stays over 90% Cxr 07/08/21 ? Lung mass  >>> CT 07/23/21 Rounded masslike area in the left upper lobe measuring up to 5.6 cm. Appearance is concerning for lung cancer. Associated left hilar adenopathy. Borderline sized mediastinal lymph nodes>>> PET 08/12/21  c/w Stage 3b lung ca > referred for fob/ebus (Dr Windell Norfolk)  Sq cell ca  Rx chemo and RT completed June 9th 2023   08/24/2021  f/u ov/[place] office/Cecile Gillispie re: GOLD 3 / restrictive component  from obesity  maint on breztri   Chief Complaint  Patient presents with   Follow-up    Patient here to go over CT and PET scan. Patient also had PFT done. Is scheduled for Bronch Thursday. Increased Productive cough with clear sputum and feels like breathing is better.  Dyspnea:  MMRC1 = can walk nl pace, flat grade, can't hurry or go uphills or steps s sob   Cough: cough after hs  Sleeping: bed is flat/ on side / 2 pillows  SABA use: a lot less now  02:  2lpm hs  and when walks > 90%  Covid status: vax 3  Rec Work on inhaler technique:   Keep walking and tracking your 02 saturation at peak exercise NOT after you stop with the goal is keep it over 90%  The key is to stop smoking completely before smoking completely stops you!      11/22/2021  f/u ov/[place] office/Elora Wolter re: GOLD 3/ 02 dep  maint on breztri (but out x sev days) and still smoking   Chief Complaint  Patient presents with   Follow-up    Ct done on 6/29. Coughing has improved.   Dyspnea:  still MMRC 1  Cough: better  Sleeping:  on side L / flat bed / 2 pillows  SABA use: neither puff nor neb 02: prn rarely uses      No obvious day to day or daytime variability or assoc excess/ purulent sputum or mucus plugs or hemoptysis or cp or chest tightness, subjective wheeze or overt sinus or hb symptoms.   Sleeping better  without nocturnal  or early am exacerbation  of respiratory  c/o's or need for noct saba. Also denies any obvious fluctuation of symptoms with weather or environmental changes or other aggravating or alleviating factors except as outlined above   No unusual exposure hx or h/o childhood pna/ asthma or knowledge of premature birth.  Current Allergies, Complete Past Medical History, Past Surgical History, Family History, and Social History were reviewed in [employer] record.  ROS  The following are not active complaints unless bolded Hoarseness, sore throat, dysphagia, dental problems,  itching, sneezing,  nasal congestion or discharge of excess mucus or purulent secretions, ear ache,   fever, chills, sweats, unintended wt loss or wt gain, classically pleuritic or exertional cp,  orthopnea pnd or arm/hand swelling  or leg swelling, presyncope, palpitations, abdominal pain, anorexia, nausea, vomiting, diarrhea  or change in bowel habits or change in bladder habits, change in stools or change in urine, dysuria, hematuria,  rash, arthralgias, visual complaints, headache, numbness, weakness or ataxia or problems with walking or coordination,  change in mood or  memory.        Current Meds  Medication Sig   acetaminophen (TYLENOL) 325 MG tablet Take 2 tablets (650 mg total) by mouth every 6 (six) hours as needed for mild pain (or Fever >/= 101).   albuterol (PROVENTIL) (2.5 MG/3ML) 0.083% nebulizer solution Take 3 mLs by nebulization every 6 (six) hours as needed for wheezing or shortness of breath.   albuterol (VENTOLIN HFA) 108 (90 Base) MCG/ACT inhaler Inhale 2 puffs into the lungs every 6 (six) hours as needed for wheezing or shortness of breath.   apixaban (ELIQUIS) 5 MG TABS tablet Take 1 tablet (5 mg total) by mouth 2 (two) times daily.   atorvastatin (LIPITOR) 20 MG tablet Take 1 tablet (20 mg total) by mouth every evening.   bisoprolol (ZEBETA) 5 MG tablet Take 1 tablet (5 mg total) by mouth daily. For BP and Heart Rate Control   Budeson-Glycopyrrol-Formoterol (BREZTRI AEROSPHERE) 160-9-4.8 MCG/ACT AERO Inhale 2 puffs into the lungs in the morning and at bedtime.   glimepiride (AMARYL) 2 MG tablet Take 1 tablet (2 mg total) by mouth every morning.   Multiple Vitamin (MULTI-VITAMIN DAILY PO) Take 1 tablet by mouth daily.   potassium chloride (KLOR-CON) 10 MEQ tablet Take 1 tablet (10 mEq total) by mouth daily. Take While taking Lasix/furosemide   torsemide 40 MG TABS Take 40 mg by mouth every morning.            Objective:     11/22/2021        266   08/24/21 264 lb 12.8 oz  (120.1 kg)  07/08/21 264 lb (119.7 kg)  06/05/21 259 lb 0.7 oz (117.5 kg)    Vital signs reviewed  11/22/2021  - Note at rest 02 sats  94% on RA   General appearance:    hoarse obese wm nad   HEENT : Oropharynx  clear  Nasal turbinates nl    NECK :  without  apparent JVD/ palpable Nodes/TM    LUNGS: no acc muscle use,  Mild barrel  contour chest wall with bilateral  Distant bs s audible wheeze and  without cough on insp or exp maneuvers  and mild  Hyperresonant  to  percussion bilaterally     CV:  RRR  no s3 or murmur or increase in P2, and no edema   ABD:  obese soft and nontender with pos end  insp Hoover's  in the supine position.  No bruits or organomegaly appreciated   MS:  Nl gait/ ext warm without deformities Or obvious joint restrictions  calf tenderness, cyanosis or clubbing     SKIN: warm and dry without lesions    NEURO:  alert, approp, nl sensorium with  no motor or cerebellar deficits apparent.      I personally reviewed images and agree with radiology impression as follows:   Chest CT with contrast 11/18/21  1. Reduced size of the masslike region in the left upper lobe along with new internal cavitation. Current size on axial images is 6.9 by 1.8 cm. 2. AP window lymph node mildly enlarged, currently 1.8 cm in short axis. 3. Aortic Atherosclerosis (ICD10-I70.0) and Emphysema (ICD10-J43.9). Coronary atherosclerosis. 4. Scarring or atelectasis along both hemidiaphragms      Assessment

## 2021-11-22 ENCOUNTER — Encounter: Payer: Self-pay | Admitting: Internal Medicine

## 2021-11-22 ENCOUNTER — Ambulatory Visit: Payer: Medicare HMO | Admitting: Internal Medicine

## 2021-11-22 DIAGNOSIS — F17210 Nicotine dependence, cigarettes, uncomplicated: Secondary | ICD-10-CM

## 2021-11-22 DIAGNOSIS — R918 Other nonspecific abnormal finding of lung field: Secondary | ICD-10-CM | POA: Diagnosis not present

## 2021-11-22 DIAGNOSIS — J449 Chronic obstructive pulmonary disease, unspecified: Secondary | ICD-10-CM

## 2021-11-22 DIAGNOSIS — J9611 Chronic respiratory failure with hypoxia: Secondary | ICD-10-CM | POA: Diagnosis not present

## 2021-11-22 DIAGNOSIS — R69 Illness, unspecified: Secondary | ICD-10-CM | POA: Diagnosis not present

## 2021-11-22 DIAGNOSIS — J9612 Chronic respiratory failure with hypercapnia: Secondary | ICD-10-CM

## 2021-11-22 MED ORDER — BREZTRI AEROSPHERE 160-9-4.8 MCG/ACT IN AERO
2.0000 | INHALATION_SPRAY | Freq: Two times a day (BID) | RESPIRATORY_TRACT | 0 refills | Status: DC
Start: 2021-11-22 — End: 2022-03-09

## 2021-11-22 NOTE — Assessment & Plan Note (Signed)
Active smoker/MM  - 07/08/2021  After extensive coaching inhaler device,  effectiveness =    75% try breztri samples and f/u with pfts - 07/08/21  Alpha one AT  MM/ level  220  - 07/08/21  Eos 0.1  - PFT's  07/22/2021   FEV1 1.80 (49 % ) ratio 0.70  p 3 % improvement from saba p brezti x one puff  prior to study with DLCO  28.26 (39%)   FV curve mild concavity    - 11/22/2021   Walked on RA  x  3   lap(s) =  approx 300  ft  @ mod pace, stopped due to sob with lowest 02 sats 91%   Group D (now reclassified as E) in terms of symptom/risk and laba/lama/ICS  therefore appropriate rx at this point >>>  breztri plus approp saba   Re SABA :  I spent extra time with pt today reviewing appropriate use of albuterol for prn use on exertion with the following points: 1) saba is for relief of sob that does not improve by walking a slower pace or resting but rather if the pt does not improve after trying this first. 2) If the pt is convinced, as many are, that saba helps recover from activity faster then it's easy to tell if this is the case by re-challenging : ie stop, take the inhaler, then p 5 minutes try the exact same activity (intensity of workload) that just caused the symptoms and see if they are substantially diminished or not after saba 3) if there is an activity that reproducibly causes the symptoms, try the saba 15 min before the activity on alternate days   If in fact the saba really does help, then fine to continue to use it prn but advised may need to look closer at the maintenance regimen being used to achieve better control of airways disease with exertion.

## 2021-11-22 NOTE — Assessment & Plan Note (Signed)
1st noted on cxr 05/31/21 on admit for chf - CT 07/23/21 Rounded masslike area in the left upper lobe measuring up to 5.6 cm. Appearance is concerning for lung cancer. Associated left hilar adenopathy. Borderline sized mediastinal lymph nodes>>> PET 08/12/21  c/w Stage 3b lung ca > referred for fob/ebus (Dr Windell Norfolk) Sq cell ca Rx chemo and RT completed June 9th 2023  - CT 11/18/21 marginally improved, cavity now in LUL c/w tissue necrosis   F/u with Dr Earlie Server planned

## 2021-11-22 NOTE — Assessment & Plan Note (Signed)
Counseled re importance of smoking cessation but did not meet time criteria for separate billing     Each maintenance medication was reviewed in detail including emphasizing most importantly the difference between maintenance and prns and under what circumstances the prns are to be triggered using an action plan format where appropriate.  Total time for H and P, chart review, counseling, reviewing hfa/neb  device(s) , directly observing portions of ambulatory 02 saturation study/ and generating customized AVS unique to this office visit / same day charting  > 30 min

## 2021-11-22 NOTE — Patient Instructions (Signed)
Plan A = Automatic = Always=    Breztri (or Symbicort ) 2 puffs first thing in am and the pm doses are optional   Plan B = Backup (to supplement plan A, not to replace it) Only use your albuterol inhaler as a rescue medication to be used if you can't catch your breath by resting or doing a relaxed purse lip breathing pattern.  - The less you use it, the better it will work when you need it. - Ok to use the inhaler up to 2 puffs  every 4 hours if you must but call for appointment if use goes up over your usual need - Don't leave home without it !!  (think of it like the spare tire for your car)   Plan C = Crisis (instead of Plan B but only if Plan B stops working) - only use your albuterol nebulizer if you first try Plan B and it fails to help > ok to use the nebulizer up to every 4 hours but if start needing it regularly call for immediate appointment   Make sure you check your oxygen saturation  AT  your highest level of activity (not after you stop)   to be sure it stays over 90% and adjust  02 flow upward to maintain this level if needed but remember to turn it back to previous settings when you stop (to conserve your supply).   Please schedule a follow up visit in 6  months but call sooner if needed

## 2021-11-22 NOTE — Assessment & Plan Note (Signed)
HC03   06/05/21   =   38  - 07/08/2021  patient walked at a moderate pace on room air x 300 ft. At end  desat to 88%. Placed on 2LO2 cont. patient sats increased to 93% for another 150 ft  - 11/22/2021 no desats walking 300 ft stopped due to sob   No need for amb 02 at this point

## 2021-11-24 ENCOUNTER — Inpatient Hospital Stay: Payer: Medicare HMO | Attending: Internal Medicine | Admitting: Internal Medicine

## 2021-11-24 ENCOUNTER — Other Ambulatory Visit: Payer: Self-pay

## 2021-11-24 VITALS — BP 123/73 | HR 86 | Temp 97.7°F | Resp 16 | Wt 270.1 lb

## 2021-11-24 DIAGNOSIS — C3412 Malignant neoplasm of upper lobe, left bronchus or lung: Secondary | ICD-10-CM | POA: Diagnosis not present

## 2021-11-24 DIAGNOSIS — C3492 Malignant neoplasm of unspecified part of left bronchus or lung: Secondary | ICD-10-CM | POA: Diagnosis not present

## 2021-11-24 DIAGNOSIS — Z85038 Personal history of other malignant neoplasm of large intestine: Secondary | ICD-10-CM | POA: Insufficient documentation

## 2021-11-24 DIAGNOSIS — J449 Chronic obstructive pulmonary disease, unspecified: Secondary | ICD-10-CM | POA: Insufficient documentation

## 2021-11-24 DIAGNOSIS — Z9049 Acquired absence of other specified parts of digestive tract: Secondary | ICD-10-CM | POA: Diagnosis not present

## 2021-11-24 DIAGNOSIS — Z79899 Other long term (current) drug therapy: Secondary | ICD-10-CM | POA: Insufficient documentation

## 2021-11-24 DIAGNOSIS — Z5112 Encounter for antineoplastic immunotherapy: Secondary | ICD-10-CM | POA: Insufficient documentation

## 2021-11-24 NOTE — Progress Notes (Signed)
[facility] Telephone:(336) [phone]   [phone]  OFFICE PROGRESS NOTE  [name], [name] [id number]  DIAGNOSIS:  Stage IIIb (T3, N2, M0) non-small cell lung cancer, squamous cell carcinoma presented with large left upper lobe lung mass in addition to mediastinal lymphadenopathy diagnosed in April 2023. The patient has a history of colon cancer treated at [facility] in 2014 status post surgical resection followed by adjuvant systemic chemotherapy.  PRIOR THERAPY: Concurrent chemoradiation with weekly carboplatin for AUC of 2 and paclitaxel 45 Mg/M2.  Status post 7 cycle.  Last cycle was given on 10/26/2021 with partial response.  CURRENT THERAPY: Consolidation treatment with immunotherapy with Imfinzi 1500 Mg IV every 4 weeks.  First dose December 01, 2021.  INTERVAL HISTORY: [name] 66 y.o. male returns to the clinic today for follow-up visit accompanied by his wife.  The patient is feeling fine today with no concerning complaints.  He has significant improvement in the dysphagia and odynophagia in the last few weeks.  He has no current weight loss or night sweats.  He has no nausea, vomiting, diarrhea or constipation.  He has no chest pain but continues to have shortness of breath with exertion with no cough or hemoptysis.  The patient has no headache or visual changes.  He had repeat CT scan of the chest performed recently and he is here for evaluation and discussion of his scan results.  MEDICAL HISTORY: Past Medical History:  Diagnosis Date   Atrial flutter ([place])    s/p DCCV 06/03/21   CHF (congestive heart failure) ([place])    Colon cancer ([place]) 04/22/2013   Hypertension    Lung cancer ([place]) 08/26/2021   Renal disorder    Kidney stones    ALLERGIES:  is allergic to paclitaxel and penicillins.  MEDICATIONS:  Current Outpatient Medications  Medication Sig Dispense Refill   acetaminophen (TYLENOL)  325 MG tablet Take 2 tablets (650 mg total) by mouth every 6 (six) hours as needed for mild pain (or Fever >/= 101). 12 tablet 0   albuterol (PROVENTIL) (2.5 MG/3ML) 0.083% nebulizer solution Take 3 mLs by nebulization every 6 (six) hours as needed for wheezing or shortness of breath. 75 mL 12   albuterol (VENTOLIN HFA) 108 (90 Base) MCG/ACT inhaler Inhale 2 puffs into the lungs every 6 (six) hours as needed for wheezing or shortness of breath. 18 g 2   apixaban (ELIQUIS) 5 MG TABS tablet Take 1 tablet (5 mg total) by mouth 2 (two) times daily. 60 tablet 2   atorvastatin (LIPITOR) 20 MG tablet Take 1 tablet (20 mg total) by mouth every evening. 30 tablet 3   bisoprolol (ZEBETA) 5 MG tablet Take 1 tablet (5 mg total) by mouth daily. For BP and Heart Rate Control 30 tablet 5   Budeson-Glycopyrrol-Formoterol (BREZTRI AEROSPHERE) 160-9-4.8 MCG/ACT AERO Inhale 2 puffs into the lungs in the morning and at bedtime. 10.7 g 0   Budeson-Glycopyrrol-Formoterol (BREZTRI AEROSPHERE) 160-9-4.8 MCG/ACT AERO Inhale 2 puffs into the lungs in the morning and at bedtime. 10.7 g 0   glimepiride (AMARYL) 2 MG tablet Take 1 tablet (2 mg total) by mouth every morning. 30 tablet 11   Multiple Vitamin (MULTI-VITAMIN DAILY PO) Take 1 tablet by mouth daily.     potassium chloride (KLOR-CON) 10 MEQ tablet Take 1 tablet (10 mEq total) by mouth daily. Take While taking Lasix/furosemide 30 tablet 2   prochlorperazine (COMPAZINE) 10  MG tablet Take 1 tablet (10 mg total) by mouth every 6 (six) hours as needed for nausea or vomiting. (Patient not taking: Reported on 11/22/2021) 30 tablet 0   torsemide 40 MG TABS Take 40 mg by mouth every morning. 30 tablet 2   No current facility-administered medications for this visit.    SURGICAL HISTORY:  Past Surgical History:  Procedure Laterality Date   BRONCHIAL BIOPSY  08/26/2021   Procedure: BRONCHIAL BIOPSIES;  Surgeon: [place] Nash, DO;  Location: [facility];  Service: Pulmonary;;    BRONCHIAL BRUSHINGS  08/26/2021   Procedure: BRONCHIAL BRUSHINGS;  Surgeon: [place] Nash, DO;  Location: [facility];  Service: Pulmonary;;   BRONCHIAL NEEDLE ASPIRATION BIOPSY  08/26/2021   Procedure: BRONCHIAL NEEDLE ASPIRATION BIOPSIES;  Surgeon: [place] Nash, DO;  Location: [facility];  Service: Pulmonary;;   CARDIOVERSION N/A 06/03/2021   Procedure: CARDIOVERSION;  Surgeon: Satira Sark, MD;  Location: [facility];  Service: Cardiovascular;  Laterality: N/A;   COLONOSCOPY N/A 04/29/2013   Procedure: COLONOSCOPY;  Surgeon: Danie Binder, MD;  Location[facility];  Service: Endoscopy;  Laterality: N/A;  10:30AM   Cyst removed from left elbow     ENDOVENOUS ABLATION SAPHENOUS VEIN W/ LASER Left 01/16/2019   endovenous laser ablation left greater saphenous vein by Ruta Hinds MD   KIDNEY STONE SURGERY     LITHOTRIPSY     PARTIAL COLECTOMY     TEE WITHOUT CARDIOVERSION N/A 06/03/2021   Procedure: TRANSESOPHAGEAL ECHOCARDIOGRAM (TEE);  Surgeon: Satira Sark, MD;  Location: [facility];  Service: Cardiovascular;  Laterality: N/A;   VIDEO BRONCHOSCOPY WITH ENDOBRONCHIAL ULTRASOUND Bilateral 08/26/2021   Procedure: VIDEO BRONCHOSCOPY WITH ENDOBRONCHIAL ULTRASOUND;  Surgeon: [place] Nash, DO;  Location: [facility];  Service: Pulmonary;  Laterality: Bilateral;  will need Guardant [id number]    REVIEW OF SYSTEMS:  Constitutional: negative Eyes: negative Ears, nose, mouth, throat, and face: negative Respiratory: positive for dyspnea on exertion Cardiovascular: negative Gastrointestinal: negative Genitourinary:negative Integument/breast: negative Hematologic/lymphatic: negative Musculoskeletal:negative Neurological: negative Behavioral/Psych: negative Endocrine: negative Allergic/Immunologic: negative   PHYSICAL EXAMINATION: General appearance: alert, cooperative, fatigued, and no distress Head: Normocephalic, without obvious abnormality, atraumatic Neck: no adenopathy,  no JVD, supple, symmetrical, trachea midline, and thyroid not enlarged, symmetric, no tenderness/mass/nodules Lymph nodes: Cervical, supraclavicular, and axillary nodes normal. Resp: clear to auscultation bilaterally Back: symmetric, no curvature. ROM normal. No CVA tenderness. Cardio: regular rate and rhythm, S1, S2 normal, no murmur, click, rub or gallop GI: soft, non-tender; bowel sounds normal; no masses,  no organomegaly Extremities: extremities normal, atraumatic, no cyanosis or edema Neurologic: Alert and oriented X 3, normal strength and tone. Normal symmetric reflexes. Normal coordination and gait  ECOG PERFORMANCE STATUS: 1 - Symptomatic but completely ambulatory  Blood pressure 123/73, pulse 86, temperature 97.7 F (36.5 C), temperature source Oral, resp. rate 16, weight 270 lb 1.6 oz (122.5 kg), SpO2 95 %.  LABORATORY DATA: Lab Results  Component Value Date   WBC 3.2 (L) 10/26/2021   HGB 11.0 (L) 10/26/2021   HCT 31.8 (L) 10/26/2021   MCV 99.7 10/26/2021   PLT 130 (L) 10/26/2021      Chemistry      Component Value Date/Time   NA 138 10/26/2021 0928   K 4.0 10/26/2021 0928   CL 100 10/26/2021 0928   CO2 33 (H) 10/26/2021 0928   BUN 19 10/26/2021 0928   CREATININE 0.90 10/26/2021 0928      Component Value Date/Time   CALCIUM  9.5 10/26/2021 0928   ALKPHOS 73 10/26/2021 0928   AST 19 10/26/2021 0928   ALT 22 10/26/2021 0928   BILITOT 0.5 10/26/2021 5027       RADIOGRAPHIC STUDIES: CT Chest W Contrast  Result Date: 11/19/2021 CLINICAL DATA:  Non-small cell lung cancer restaging * Tracking Code: BO * EXAM: CT CHEST WITH CONTRAST TECHNIQUE: Multidetector CT imaging of the chest was performed during intravenous contrast administration. RADIATION DOSE REDUCTION: This exam was performed according to the departmental dose-optimization program which includes automated exposure control, adjustment of the mA and/or kV according to patient size and/or use of iterative  reconstruction technique. CONTRAST:  59mL OMNIPAQUE IOHEXOL 300 MG/ML  SOLN COMPARISON:  PET-CT 08/12/2021 and chest CT 07/23/2021 FINDINGS: Cardiovascular: Coronary, aortic arch, and branch vessel atherosclerotic vascular disease. Mediastinum/Nodes: Index AP window lymph node 1.8 cm in short axis on image 64 series 2, formerly 1.5 cm by my measurement. Smaller AP window lymph nodes are also present along with small paratracheal lymph nodes. Lungs/Pleura: Severe centrilobular emphysema. Interval cavitation of the left upper lobe mass. Associated bandlike density currently 6.9 by 1.8 cm on image 69 series 5, previously about 8.3 by 3.9 cm. Scarring or atelectasis along both hemidiaphragms. Upper Abdomen: Unremarkable Musculoskeletal: Old healed bilateral rib fractures. Thoracic spondylosis. IMPRESSION: 1. Reduced size of the masslike region in the left upper lobe along with new internal cavitation. Current size on axial images is 6.9 by 1.8 cm. 2. AP window lymph node mildly enlarged, currently 1.8 cm in short axis. 3. Aortic Atherosclerosis (ICD10-I70.0) and Emphysema (ICD10-J43.9). Coronary atherosclerosis. 4. Scarring or atelectasis along both hemidiaphragms. Electronically Signed   By: Van Clines M.D.   On: 11/19/2021 08:00    ASSESSMENT AND PLAN: This is a very pleasant 66 years old white male with  stage IIIb (T3, N2, M0) non-small cell lung cancer, squamous cell carcinoma presented with large left upper lobe lung mass in addition to mediastinal lymphadenopathy diagnosed in April 2023. The patient has a history of colon cancer treated at [facility] in 2014 status post surgical resection followed by adjuvant systemic chemotherapy. The patient underwent a course of concurrent chemoradiation with weekly carboplatin for AUC of 2 and paclitaxel 45 Mg/M2 status post 7 cycle.  Last cycle was given on 10/26/2021. The patient tolerated the previous course of his treatment fairly well except for  the mild odynophagia which significantly improved.  He continues to have baseline shortness of breath secondary to his COPD and radiation. He had repeat CT scan of the chest performed recently.  I personally and independently reviewed the scan images and discussed the result and showed the images to the patient and his wife. His scan showed reduced size of the masslike region in the left upper lobe but there is slight increase in size of AP window lymph node that could be secondary to the inflammatory process from the radiation therapy and will need close monitoring. I recommended for the patient to consider consolidation treatment with immunotherapy with Imfinzi 1500 Mg IV every 4 weeks for a total of 1 year unless the patient has evidence for disease progression or unacceptable toxicity. He was also given the option of observation and close monitoring. The patient is interested in proceeding with immunotherapy. I discussed with him the adverse effect of this treatment including but not limited to immunotherapy mediated the skin rash, diarrhea, inflammation of the lung, kidney, liver, thyroid or other endocrine dysfunction including type 1 diabetes mellitus. He is expected to  start the first dose of this treatment next week. The patient will come back for follow-up visit in 5 weeks for evaluation with the start of cycle #2. He was advised to call immediately if he has any other concerning symptoms in the interval. The patient voices understanding of current disease status and treatment options and is in agreement with the current care plan.  All questions were answered. The patient knows to call the clinic with any problems, questions or concerns. We can certainly see the patient much sooner if necessary.  The total time spent in the appointment was 35 minutes.  Disclaimer: This note was dictated with voice recognition software. Similar sounding words can inadvertently be transcribed and may not be  corrected upon review.

## 2021-11-24 NOTE — Progress Notes (Signed)
DISCONTINUE ON PATHWAY REGIMEN - Non-Small Cell Lung     Administer weekly:     Paclitaxel      Carboplatin   **Always confirm dose/schedule in your pharmacy ordering system**  REASON: Continuation Of Treatment PRIOR TREATMENT: [id number]: Carboplatin AUC=2 + Paclitaxel 45 mg/m2 Weekly During Radiation TREATMENT RESPONSE: Partial Response (PR)  START ON PATHWAY REGIMEN - Non-Small Cell Lung     A cycle is every 28 days:     Durvalumab   **Always confirm dose/schedule in your pharmacy ordering system**  Patient Characteristics: Preoperative or Nonsurgical Candidate (Clinical Staging), Stage III - Nonsurgical Candidate (Nonsquamous and Squamous), PS = 0, 1 Therapeutic Status: Preoperative or Nonsurgical Candidate (Clinical Staging) AJCC T Category: cT3 AJCC N Category: cN2 AJCC M Category: cM0 AJCC 8 Stage Grouping: IIIB ECOG Performance Status: 1 Intent of Therapy: Curative Intent, Discussed with Patient

## 2021-12-01 ENCOUNTER — Inpatient Hospital Stay: Payer: Medicare HMO

## 2021-12-01 ENCOUNTER — Other Ambulatory Visit: Payer: Self-pay

## 2021-12-01 VITALS — BP 116/74 | HR 64 | Temp 98.0°F | Resp 16 | Wt 269.0 lb

## 2021-12-01 DIAGNOSIS — C3412 Malignant neoplasm of upper lobe, left bronchus or lung: Secondary | ICD-10-CM | POA: Diagnosis not present

## 2021-12-01 DIAGNOSIS — Z79899 Other long term (current) drug therapy: Secondary | ICD-10-CM | POA: Diagnosis not present

## 2021-12-01 DIAGNOSIS — J449 Chronic obstructive pulmonary disease, unspecified: Secondary | ICD-10-CM | POA: Diagnosis not present

## 2021-12-01 DIAGNOSIS — Z5112 Encounter for antineoplastic immunotherapy: Secondary | ICD-10-CM | POA: Diagnosis not present

## 2021-12-01 DIAGNOSIS — Z9049 Acquired absence of other specified parts of digestive tract: Secondary | ICD-10-CM | POA: Diagnosis not present

## 2021-12-01 DIAGNOSIS — C3492 Malignant neoplasm of unspecified part of left bronchus or lung: Secondary | ICD-10-CM

## 2021-12-01 DIAGNOSIS — Z85038 Personal history of other malignant neoplasm of large intestine: Secondary | ICD-10-CM | POA: Diagnosis not present

## 2021-12-01 LAB — CBC WITH DIFFERENTIAL (CANCER CENTER ONLY)
Abs Immature Granulocytes: 0.01 10*3/uL (ref 0.00–0.07)
Basophils Absolute: 0 10*3/uL (ref 0.0–0.1)
Basophils Relative: 0 %
Eosinophils Absolute: 0 10*3/uL (ref 0.0–0.5)
Eosinophils Relative: 1 %
HCT: 34 % — ABNORMAL LOW (ref 39.0–52.0)
Hemoglobin: 11.5 g/dL — ABNORMAL LOW (ref 13.0–17.0)
Immature Granulocytes: 0 %
Lymphocytes Relative: 12 %
Lymphs Abs: 0.7 10*3/uL (ref 0.7–4.0)
MCH: 37.1 pg — ABNORMAL HIGH (ref 26.0–34.0)
MCHC: 33.8 g/dL (ref 30.0–36.0)
MCV: 109.7 fL — ABNORMAL HIGH (ref 80.0–100.0)
Monocytes Absolute: 0.6 10*3/uL (ref 0.1–1.0)
Monocytes Relative: 10 %
Neutro Abs: 4.9 10*3/uL (ref 1.7–7.7)
Neutrophils Relative %: 77 %
Platelet Count: 138 10*3/uL — ABNORMAL LOW (ref 150–400)
RBC: 3.1 MIL/uL — ABNORMAL LOW (ref 4.22–5.81)
RDW: 20.7 % — ABNORMAL HIGH (ref 11.5–15.5)
WBC Count: 6.3 10*3/uL (ref 4.0–10.5)
nRBC: 0 % (ref 0.0–0.2)

## 2021-12-01 LAB — CMP (CANCER CENTER ONLY)
ALT: 22 U/L (ref 0–44)
AST: 22 U/L (ref 15–41)
Albumin: 3.9 g/dL (ref 3.5–5.0)
Alkaline Phosphatase: 76 U/L (ref 38–126)
Anion gap: 5 (ref 5–15)
BUN: 15 mg/dL (ref 8–23)
CO2: 30 mmol/L (ref 22–32)
Calcium: 9.3 mg/dL (ref 8.9–10.3)
Chloride: 105 mmol/L (ref 98–111)
Creatinine: 0.97 mg/dL (ref 0.61–1.24)
GFR, Estimated: >60 mL/min (ref >60)
Glucose, Bld: 170 mg/dL — ABNORMAL HIGH (ref 70–99)
Potassium: 4.4 mmol/L (ref 3.5–5.1)
Sodium: 140 mmol/L (ref 135–145)
Total Bilirubin: 0.7 mg/dL (ref 0.3–1.2)
Total Protein: 6.8 g/dL (ref 6.5–8.1)

## 2021-12-01 LAB — TSH: TSH: 1.328 u[IU]/mL (ref 0.350–4.500)

## 2021-12-01 MED ORDER — SODIUM CHLORIDE 0.9 % IV SOLN: Freq: Once | INTRAVENOUS | Status: AC

## 2021-12-01 MED ORDER — SODIUM CHLORIDE 0.9 % IV SOLN
1500.0000 mg | Freq: Once | INTRAVENOUS | Status: AC
  Administered 2021-12-01: 1500 mg via INTRAVENOUS
  Filled 2021-12-01: qty 30

## 2021-12-01 NOTE — Patient Instructions (Signed)
[facility]  Discharge Instructions: Thank you for choosing [facility] to provide your oncology and hematology care.   If you have a lab appointment with the [facility], please go directly to the [facility] and check in at the registration area.   Wear comfortable clothing and clothing appropriate for easy access to any Portacath or PICC line.   We strive to give you quality time with your provider. You may need to reschedule your appointment if you arrive late (15 or more minutes).  Arriving late affects you and other patients whose appointments are after yours.  Also, if you miss three or more appointments without notifying the office, you may be dismissed from the clinic at the provider's discretion.      For prescription refill requests, have your pharmacy contact our office and allow 72 hours for refills to be completed.    Today you received the following chemotherapy and/or immunotherapy agents: Durvalumab/Imfinzi      To help prevent nausea and vomiting after your treatment, we encourage you to take your nausea medication as directed.  BELOW ARE SYMPTOMS THAT SHOULD BE REPORTED IMMEDIATELY: *FEVER GREATER THAN 100.4 F (38 C) OR HIGHER *CHILLS OR SWEATING *NAUSEA AND VOMITING THAT IS NOT CONTROLLED WITH YOUR NAUSEA MEDICATION *UNUSUAL SHORTNESS OF BREATH *UNUSUAL BRUISING OR BLEEDING *URINARY PROBLEMS (pain or burning when urinating, or frequent urination) *BOWEL PROBLEMS (unusual diarrhea, constipation, pain near the anus) TENDERNESS IN MOUTH AND THROAT WITH OR WITHOUT PRESENCE OF ULCERS (sore throat, sores in mouth, or a toothache) UNUSUAL RASH, SWELLING OR PAIN  UNUSUAL VAGINAL DISCHARGE OR ITCHING   Items with * indicate a potential emergency and should be followed up as soon as possible or go to the Emergency Department if any problems should occur.  Please show the CHEMOTHERAPY ALERT CARD or IMMUNOTHERAPY ALERT CARD at  check-in to the Emergency Department and triage nurse.  Should you have questions after your visit or need to cancel or reschedule your appointment, please contact [facility]  Dept: [phone]  and follow the prompts.  Office hours are 8:00 a.m. to 4:30 p.m. Monday - Friday. Please note that voicemails left after 4:00 p.m. may not be returned until the following business day.  We are closed weekends and major holidays. You have access to a nurse at all times for urgent questions. Please call the main number to the clinic Dept: [phone] and follow the prompts.   For any non-urgent questions, you may also contact your provider using MyChart. We now offer e-Visits for anyone 76 and older to request care online for non-urgent symptoms. For details visit [email or web address].   Also download the MyChart app! Go to the app store, search "MyChart", open the app, select [facility], and log in with your MyChart username and password.  Masks are optional in the cancer centers. If you would like for your care team to wear a mask while they are taking care of you, please let them know. For doctor visits, patients may have with them one support person who is at least 67 years old. At this time, visitors are not allowed in the infusion area. Durvalumab injection What is this medication? DURVALUMAB (dur VAL ue mab) is a monoclonal antibody. It is used to treat lung cancer. This medicine may be used for other purposes; ask your health care provider or pharmacist if you have questions. COMMON BRAND NAME(S): IMFINZI What should I tell my care  team before I take this medication? They need to know if you have any of these conditions: autoimmune diseases like Crohn's disease, ulcerative colitis, or lupus have had or planning to have an allogeneic stem cell transplant (uses someone else's stem cells) history of organ transplant history of radiation to the chest nervous system  problems like myasthenia gravis or Guillain-Barre syndrome an unusual or allergic reaction to durvalumab, other medicines, foods, dyes, or preservatives pregnant or trying to get pregnant breast-feeding How should I use this medication? This medicine is for infusion into a vein. It is given by a health care professional in a hospital or clinic setting. A special MedGuide will be given to you before each treatment. Be sure to read this information carefully each time. Talk to your pediatrician regarding the use of this medicine in children. Special care may be needed. Overdosage: If you think you have taken too much of this medicine contact a poison control center or emergency room at once. NOTE: This medicine is only for you. Do not share this medicine with others. What if I miss a dose? It is important not to miss your dose. Call your doctor or health care professional if you are unable to keep an appointment. What may interact with this medication? Interactions have not been studied. This list may not describe all possible interactions. Give your health care provider a list of all the medicines, herbs, non-prescription drugs, or dietary supplements you use. Also tell them if you smoke, drink alcohol, or use illegal drugs. Some items may interact with your medicine. What should I watch for while using this medication? This medication may make you feel generally unwell. Continue your course of treatment even though you feel ill unless your care team tells you to stop. You may need blood work done while you are taking this medication. Do not become pregnant while taking this medication or for 3 months after stopping it. Women should inform their care team if they wish to become pregnant or think they might be pregnant. There is a potential for serious side effects to an unborn child. Talk to your care team or pharmacist for more information. Do not breast-feed an infant while taking this medication  or for 3 months after stopping it. What side effects may I notice from receiving this medication? Side effects that you should report to your care team as soon as possible: Allergic reactions--skin rash, itching, hives, swelling of the face, lips, tongue, or throat Bloody or watery diarrhea Dizziness, loss of balance or coordination, confusion or trouble speaking Dry cough, shortness of breath or trouble breathing Flushing, mostly over the face, neck, and chest, during injection High blood sugar (hyperglycemia)--increased thirst or amount of urine, unusual weakness or fatigue, blurry vision High thyroid levels (hyperthyroidism)--fast or irregular heartbeat, weight loss, excessive sweating or sensitivity to heat, tremors or shaking, anxiety, nervousness, irregular menstrual cycle or spotting Infection--fever, chills, cough, or sore throat Liver injury--right upper belly pain, loss of appetite, nausea, light-colored stool, dark yellow or brown urine, yellowing skin or eyes, unusual weakness or fatigue Low adrenal gland function--nausea, vomiting, loss of appetite, unusual weakness or fatigue, dizziness, low blood pressure Low thyroid levels (hypothyroidism)--unusual weakness or fatigue, increased sensitivity to cold, constipation, hair loss, dry skin, weight gain, feelings of depression Pancreatitis--severe stomach pain that spreads to your back or gets worse after eating or when touched, fever, nausea, vomiting Rash, fever, and swollen lymph nodes Redness, blistering, peeling or loosening of the skin, including inside the  mouth Wheezing--trouble breathing with loud or whistling sounds Side effects that usually do not require medical attention (report these to your care team if they continue or are bothersome): Fatigue Hair loss This list may not describe all possible side effects. Call your doctor for medical advice about side effects. You may report side effects to FDA at 1-800-FDA-1088. Where  should I keep my medication? This medication is given in a hospital or clinic. It will not be stored at home. NOTE: This sheet is a summary. It may not cover all possible information. If you have questions about this medicine, talk to your doctor, pharmacist, or health care provider.  2023 Elsevier/Gold Standard (2021-04-09 00:00:00)

## 2021-12-08 ENCOUNTER — Telehealth: Payer: Self-pay | Admitting: Internal Medicine

## 2021-12-08 DIAGNOSIS — I5022 Chronic systolic (congestive) heart failure: Secondary | ICD-10-CM | POA: Diagnosis not present

## 2021-12-08 DIAGNOSIS — J9601 Acute respiratory failure with hypoxia: Secondary | ICD-10-CM | POA: Diagnosis not present

## 2021-12-08 NOTE — Progress Notes (Signed)
  Radiation Oncology         ([phone] ________________________________  Name: [name] MRN: [id number]  Date of Service: 12/13/2021  DOB: 30-Aug-1955  Post Treatment Telephone Note  Diagnosis:   Stage IIIB, cT3N2M0, NSCLC, squamous cell carcinoma of the LUL  Intent: Curative  Radiation Treatment Dates: 09/13/2021 through 10/29/2021 Site Technique Total Dose (Gy) Dose per Fx (Gy) Completed Fx Beam Energies  Lung, Left: Lung_L 3D 60/60 2 30/30 6X  Lung, Left: Lung_L_Bst 3D 6/6 2 3/3 6X   Narrative: The patient tolerated radiation therapy relatively well. He developed fatigue and anticipated skin changes in the treatment field as well was esophagitis but did not require medication to manage his comfort with eating. He reports he is doing well and is tolerating immunotherapy at this time. He's eating well and denies any difficulty now with his skin.    Impression/Plan: 1. Stage IIIB, cT3N2M0, NSCLC, squamous cell carcinoma of the LUL. The patient has been doing well since completion of radiotherapy. We discussed that we would be happy to continue to follow him as needed, but he will also continue to follow up with Dr. Julien Nordmann in medical oncology. He will also follow up with Dr. Mickeal Skinner as he had an abnormal brain MRI during his work up.     Carola Rhine, PAC

## 2021-12-08 NOTE — Telephone Encounter (Signed)
Called patient regarding upcoming August and September appointments, patient has been called and notified.

## 2021-12-13 ENCOUNTER — Ambulatory Visit
Admission: RE | Admit: 2021-12-13 | Discharge: 2021-12-13 | Disposition: A | Discharge status: Home / Self Care | Payer: Medicare HMO | Source: Ambulatory Visit | Attending: Radiation Oncology | Admitting: Radiation Oncology

## 2021-12-13 ENCOUNTER — Other Ambulatory Visit: Payer: Self-pay

## 2021-12-13 DIAGNOSIS — C3492 Malignant neoplasm of unspecified part of left bronchus or lung: Secondary | ICD-10-CM

## 2021-12-18 ENCOUNTER — Other Ambulatory Visit: Payer: Self-pay

## 2021-12-20 ENCOUNTER — Telehealth: Payer: Self-pay | Admitting: Cardiology

## 2021-12-20 NOTE — Telephone Encounter (Signed)
Patient c/o Palpitations:  High priority if patient c/o lightheadedness, shortness of breath, or chest pain  How long have you had palpitations/irregular HR/ Afib? Patient thinks his heart is out of rhythm again Are you having the symptoms now? no  Are you currently experiencing lightheadedness, SOB or CP? no  Do you have a history of afib (atrial fibrillation) or irregular heart rhythm? Yes, has his of irregular heart rhythm  Have you checked your BP or HR? (document readings if available): 90, 78  Are you experiencing any other symptoms? Has experienced in the past lightheaded and SOB.

## 2021-12-20 NOTE — Telephone Encounter (Signed)
[name] states he has felt like his heart rate has been irregular for the past 3-4 days. His highest recorded HR was 100  We will have him come in tomorrow for an EKG   I will FYI Dr.Branch

## 2021-12-21 ENCOUNTER — Ambulatory Visit (INDEPENDENT_AMBULATORY_CARE_PROVIDER_SITE_OTHER): Payer: Medicare HMO

## 2021-12-21 ENCOUNTER — Other Ambulatory Visit: Payer: Self-pay

## 2021-12-21 ENCOUNTER — Ambulatory Visit (INDEPENDENT_AMBULATORY_CARE_PROVIDER_SITE_OTHER): Payer: Medicare HMO | Admitting: *Deleted

## 2021-12-21 VITALS — BP 122/64 | HR 76 | Ht 72.0 in | Wt 265.0 lb

## 2021-12-21 DIAGNOSIS — I4892 Unspecified atrial flutter: Secondary | ICD-10-CM

## 2021-12-21 DIAGNOSIS — R002 Palpitations: Secondary | ICD-10-CM

## 2021-12-21 NOTE — Progress Notes (Signed)
Pt states that he has been SOB for the last 3 days and has some chest pressure. Repots the chest pressure comes and goes and is not affected by movement. " It feels like my heart is out of rhythm." Denies sweating and N/V at this time. Will sent EKG to Dr. Harl Bowie for review.

## 2021-12-22 ENCOUNTER — Other Ambulatory Visit: Payer: Self-pay | Admitting: Cardiology

## 2021-12-22 DIAGNOSIS — R002 Palpitations: Secondary | ICD-10-CM

## 2021-12-22 NOTE — Telephone Encounter (Signed)
Pt notified and monitor placed,

## 2021-12-22 NOTE — Telephone Encounter (Signed)
EKG reviewed, sent message to [facility] on epic messenger to arrange 7 day zio  patch  Zandra Abts MD

## 2021-12-24 ENCOUNTER — Other Ambulatory Visit: Payer: Self-pay

## 2021-12-29 ENCOUNTER — Other Ambulatory Visit: Payer: Self-pay

## 2021-12-29 ENCOUNTER — Inpatient Hospital Stay (HOSPITAL_BASED_OUTPATIENT_CLINIC_OR_DEPARTMENT_OTHER): Payer: Medicare HMO | Admitting: Internal Medicine

## 2021-12-29 ENCOUNTER — Inpatient Hospital Stay: Payer: Medicare HMO

## 2021-12-29 ENCOUNTER — Inpatient Hospital Stay: Payer: Medicare HMO | Attending: Internal Medicine

## 2021-12-29 VITALS — BP 137/82 | HR 86 | Temp 98.2°F | Resp 20 | Wt 272.7 lb

## 2021-12-29 VITALS — BP 114/72 | HR 64 | Temp 97.8°F | Resp 20

## 2021-12-29 DIAGNOSIS — Z79899 Other long term (current) drug therapy: Secondary | ICD-10-CM | POA: Insufficient documentation

## 2021-12-29 DIAGNOSIS — C3412 Malignant neoplasm of upper lobe, left bronchus or lung: Secondary | ICD-10-CM | POA: Diagnosis not present

## 2021-12-29 DIAGNOSIS — C3492 Malignant neoplasm of unspecified part of left bronchus or lung: Secondary | ICD-10-CM | POA: Diagnosis not present

## 2021-12-29 DIAGNOSIS — Z5112 Encounter for antineoplastic immunotherapy: Secondary | ICD-10-CM | POA: Insufficient documentation

## 2021-12-29 LAB — CMP (CANCER CENTER ONLY)
ALT: 22 U/L (ref 0–44)
AST: 19 U/L (ref 15–41)
Albumin: 4.2 g/dL (ref 3.5–5.0)
Alkaline Phosphatase: 86 U/L (ref 38–126)
Anion gap: 4 — ABNORMAL LOW (ref 5–15)
BUN: 15 mg/dL (ref 8–23)
CO2: 34 mmol/L — ABNORMAL HIGH (ref 22–32)
Calcium: 9.4 mg/dL (ref 8.9–10.3)
Chloride: 103 mmol/L (ref 98–111)
Creatinine: 0.88 mg/dL (ref 0.61–1.24)
GFR, Estimated: >60 mL/min (ref >60)
Glucose, Bld: 113 mg/dL — ABNORMAL HIGH (ref 70–99)
Potassium: 4.7 mmol/L (ref 3.5–5.1)
Sodium: 141 mmol/L (ref 135–145)
Total Bilirubin: 0.7 mg/dL (ref 0.3–1.2)
Total Protein: 7.4 g/dL (ref 6.5–8.1)

## 2021-12-29 LAB — CBC WITH DIFFERENTIAL (CANCER CENTER ONLY)
Abs Immature Granulocytes: 0.02 10*3/uL (ref 0.00–0.07)
Basophils Absolute: 0 10*3/uL (ref 0.0–0.1)
Basophils Relative: 0 %
Eosinophils Absolute: 0 10*3/uL (ref 0.0–0.5)
Eosinophils Relative: 0 %
HCT: 37.2 % — ABNORMAL LOW (ref 39.0–52.0)
Hemoglobin: 12.4 g/dL — ABNORMAL LOW (ref 13.0–17.0)
Immature Granulocytes: 0 %
Lymphocytes Relative: 10 %
Lymphs Abs: 0.7 10*3/uL (ref 0.7–4.0)
MCH: 37.7 pg — ABNORMAL HIGH (ref 26.0–34.0)
MCHC: 33.3 g/dL (ref 30.0–36.0)
MCV: 113.1 fL — ABNORMAL HIGH (ref 80.0–100.0)
Monocytes Absolute: 0.8 10*3/uL (ref 0.1–1.0)
Monocytes Relative: 12 %
Neutro Abs: 5.5 10*3/uL (ref 1.7–7.7)
Neutrophils Relative %: 78 %
Platelet Count: 158 10*3/uL (ref 150–400)
RBC: 3.29 MIL/uL — ABNORMAL LOW (ref 4.22–5.81)
RDW: 15.5 % (ref 11.5–15.5)
WBC Count: 7.1 10*3/uL (ref 4.0–10.5)
nRBC: 0 % (ref 0.0–0.2)

## 2021-12-29 LAB — TSH: TSH: 0.881 u[IU]/mL (ref 0.350–4.500)

## 2021-12-29 MED ORDER — SODIUM CHLORIDE 0.9 % IV SOLN: Freq: Once | INTRAVENOUS | Status: AC

## 2021-12-29 MED ORDER — SODIUM CHLORIDE 0.9 % IV SOLN
1500.0000 mg | Freq: Once | INTRAVENOUS | Status: AC
  Administered 2021-12-29: 1500 mg via INTRAVENOUS
  Filled 2021-12-29: qty 30

## 2021-12-29 NOTE — Progress Notes (Signed)
[facility] [phone]   [phone]  OFFICE PROGRESS NOTE  [name], [place] [address]  DIAGNOSIS:  Stage IIIb (T3, N2, M0) non-small cell lung cancer, squamous cell carcinoma presented with large left upper lobe lung mass in addition to mediastinal lymphadenopathy diagnosed in April 2023. The patient has a history of colon cancer treated at [facility] in 2014 status post surgical resection followed by adjuvant systemic chemotherapy.  PRIOR THERAPY: Concurrent chemoradiation with weekly carboplatin for AUC of 2 and paclitaxel 45 Mg/M2.  Status post 7 cycle.  Last cycle was given on 10/26/2021 with partial response.  CURRENT THERAPY: Consolidation treatment with immunotherapy with Imfinzi 1500 Mg IV every 4 weeks.  First dose December 01, 2021.  Status post 1 cycle.  INTERVAL HISTORY: [name] 66 y.o. male returns to the clinic today for follow-up visit accompanied by his wife.  The patient is feeling fine today with no concerning complaints except for the baseline shortness of breath.  He uses home oxygen but he did not bring it with him to the clinic and his oxygen saturation was down to 85% without oxygen in the clinic.  He denied having any chest pain, cough or hemoptysis.  He has no nausea, vomiting, diarrhea or constipation.  He has no headache or visual changes.  He denied having any significant weight loss or night sweats.  He is here today for evaluation before starting cycle #2 of consolidation treatment with immunotherapy.   MEDICAL HISTORY: Past Medical History:  Diagnosis Date   Atrial flutter ([place])    s/p DCCV 06/03/21   CHF (congestive heart failure) ([place])    Colon cancer ([place]) 04/22/2013   Hypertension    Lung cancer ([place]) 08/26/2021   Renal disorder    Kidney stones    ALLERGIES:  is allergic to paclitaxel and penicillins.  MEDICATIONS:  Current Outpatient Medications   Medication Sig Dispense Refill   acetaminophen (TYLENOL) 325 MG tablet Take 2 tablets (650 mg total) by mouth every 6 (six) hours as needed for mild pain (or Fever >/= 101). 12 tablet 0   apixaban (ELIQUIS) 5 MG TABS tablet Take 1 tablet (5 mg total) by mouth 2 (two) times daily. 60 tablet 2   atorvastatin (LIPITOR) 20 MG tablet Take 1 tablet (20 mg total) by mouth every evening. 30 tablet 3   bisoprolol (ZEBETA) 5 MG tablet Take 1 tablet (5 mg total) by mouth daily. For BP and Heart Rate Control 30 tablet 5   Budeson-Glycopyrrol-Formoterol (BREZTRI AEROSPHERE) 160-9-4.8 MCG/ACT AERO Inhale 2 puffs into the lungs in the morning and at bedtime. 10.7 g 0   Budeson-Glycopyrrol-Formoterol (BREZTRI AEROSPHERE) 160-9-4.8 MCG/ACT AERO Inhale 2 puffs into the lungs in the morning and at bedtime. 10.7 g 0   glimepiride (AMARYL) 2 MG tablet Take 1 tablet (2 mg total) by mouth every morning. 30 tablet 11   Multiple Vitamin (MULTI-VITAMIN DAILY PO) Take 1 tablet by mouth daily.     potassium chloride (KLOR-CON) 10 MEQ tablet Take 1 tablet (10 mEq total) by mouth daily. Take While taking Lasix/furosemide 30 tablet 2   prochlorperazine (COMPAZINE) 10 MG tablet Take 1 tablet (10 mg total) by mouth every 6 (six) hours as needed for nausea or vomiting. 30 tablet 0   torsemide 40 MG TABS Take 40 mg by mouth every morning. 30 tablet 2   No current facility-administered medications for this visit.  SURGICAL HISTORY:  Past Surgical History:  Procedure Laterality Date   BRONCHIAL BIOPSY  08/26/2021   Procedure: BRONCHIAL BIOPSIES;  Surgeon: Garner Nash, DO;  Location: [facility];  Service: Pulmonary;;   BRONCHIAL BRUSHINGS  08/26/2021   Procedure: BRONCHIAL BRUSHINGS;  Surgeon: Garner Nash, DO;  Location: [facility];  Service: Pulmonary;;   BRONCHIAL NEEDLE ASPIRATION BIOPSY  08/26/2021   Procedure: BRONCHIAL NEEDLE ASPIRATION BIOPSIES;  Surgeon: Garner Nash, DO;  Location: [facility];  Service:  Pulmonary;;   CARDIOVERSION N/A 06/03/2021   Procedure: CARDIOVERSION;  Surgeon: Satira Sark, MD;  Location: [facility];  Service: Cardiovascular;  Laterality: N/A;   COLONOSCOPY N/A 04/29/2013   Procedure: COLONOSCOPY;  Surgeon: Danie Binder, MD;  [facility];  Service: Endoscopy;  Laterality: N/A;  10:30AM   Cyst removed from left elbow     ENDOVENOUS ABLATION SAPHENOUS VEIN W/ LASER Left 01/16/2019   endovenous laser ablation left greater saphenous vein by Ruta Hinds MD   KIDNEY STONE SURGERY     LITHOTRIPSY     PARTIAL COLECTOMY     TEE WITHOUT CARDIOVERSION N/A 06/03/2021   Procedure: TRANSESOPHAGEAL ECHOCARDIOGRAM (TEE);  Surgeon: Satira Sark, MD;  Location: [facility];  Service: Cardiovascular;  Laterality: N/A;   VIDEO BRONCHOSCOPY WITH ENDOBRONCHIAL ULTRASOUND Bilateral 08/26/2021   Procedure: VIDEO BRONCHOSCOPY WITH ENDOBRONCHIAL ULTRASOUND;  Surgeon: Garner Nash, DO;  Location: [facility];  Service: Pulmonary;  Laterality: Bilateral;  will need Guardant [id number]    REVIEW OF SYSTEMS:  A comprehensive review of systems was negative except for: Constitutional: positive for fatigue Respiratory: positive for dyspnea on exertion   PHYSICAL EXAMINATION: General appearance: alert, cooperative, fatigued, and no distress Head: Normocephalic, without obvious abnormality, atraumatic Neck: no adenopathy, no JVD, supple, symmetrical, trachea midline, and thyroid not enlarged, symmetric, no tenderness/mass/nodules Lymph nodes: Cervical, supraclavicular, and axillary nodes normal. Resp: clear to auscultation bilaterally Back: symmetric, no curvature. ROM normal. No CVA tenderness. Cardio: regular rate and rhythm, S1, S2 normal, no murmur, click, rub or gallop GI: soft, non-tender; bowel sounds normal; no masses,  no organomegaly Extremities: extremities normal, atraumatic, no cyanosis or edema  ECOG PERFORMANCE STATUS: 1 - Symptomatic but completely  ambulatory  Blood pressure 137/82, pulse 86, temperature 98.2 F (36.8 C), temperature source Oral, resp. rate 20, weight 272 lb 11.2 oz (123.7 kg), SpO2 (!) 83 %.  LABORATORY DATA: Lab Results  Component Value Date   WBC 7.1 12/29/2021   HGB 12.4 (L) 12/29/2021   HCT 37.2 (L) 12/29/2021   MCV 113.1 (H) 12/29/2021   PLT 158 12/29/2021      Chemistry      Component Value Date/Time   NA 141 12/29/2021 1030   K 4.7 12/29/2021 1030   CL 103 12/29/2021 1030   CO2 34 (H) 12/29/2021 1030   BUN 15 12/29/2021 1030   CREATININE 0.88 12/29/2021 1030      Component Value Date/Time   CALCIUM 9.4 12/29/2021 1030   ALKPHOS 86 12/29/2021 1030   AST 19 12/29/2021 1030   ALT 22 12/29/2021 1030   BILITOT 0.7 12/29/2021 1030       RADIOGRAPHIC STUDIES: No results found.  ASSESSMENT AND PLAN: This is a very pleasant 66 years old white male with  stage IIIb (T3, N2, M0) non-small cell lung cancer, squamous cell carcinoma presented with large left upper lobe lung mass in addition to mediastinal lymphadenopathy diagnosed in April 2023. The patient has a history of colon  cancer treated at [facility] in 2014 status post surgical resection followed by adjuvant systemic chemotherapy. The patient underwent a course of concurrent chemoradiation with weekly carboplatin for AUC of 2 and paclitaxel 45 Mg/M2 status post 7 cycle.  Last cycle was given on 10/26/2021. The patient tolerated the previous course of his treatment fairly well except for the mild odynophagia which significantly improved.  He continues to have baseline shortness of breath secondary to his COPD and radiation. The patient is currently undergoing treatment with immunotherapy with Imfinzi 1500 Mg IV every 4 weeks status post 1 cycle.  He tolerated the first cycle of his treatment well with no concerning adverse effects. I recommended for him to proceed with cycle #2 today as planned. I will see him back for follow-up visit in  4 weeks for evaluation before the next cycle of his treatment. For the COPD, I strongly encouraged the patient to continue on the home oxygen and to monitor his oxygen saturation closely at home. The patient was advised to call immediately if he has any concerning symptoms in the interval. The patient voices understanding of current disease status and treatment options and is in agreement with the current care plan.  All questions were answered. The patient knows to call the clinic with any problems, questions or concerns. We can certainly see the patient much sooner if necessary.  The total time spent in the appointment was 20 minutes.  Disclaimer: This note was dictated with voice recognition software. Similar sounding words can inadvertently be transcribed and may not be corrected upon review.

## 2021-12-29 NOTE — Patient Instructions (Signed)
[facility] MEDICAL [facility]COLOGY  Discharge Instructions: Thank you for choosing [facility] to provide your oncology and hematology care.   If you have a lab appointment with the Cancer Center, please go directly to the Cancer Center and check in at the registration area.   Wear comfortable clothing and clothing appropriate for easy access to any Portacath or PICC line.   We strive to give you quality time with your provider. You may need to reschedule your appointment if you arrive late (15 or more minutes).  Arriving late affects you and other patients whose appointments are after yours.  Also, if you miss three or more appointments without notifying the office, you may be dismissed from the clinic at the provider's discretion.      For prescription refill requests, have your pharmacy contact our office and allow 72 hours for refills to be completed.    Today you received the following chemotherapy and/or immunotherapy agents; Imfinzi      To help prevent nausea and vomiting after your treatment, we encourage you to take your nausea medication as directed.  BELOW ARE SYMPTOMS THAT SHOULD BE REPORTED IMMEDIATELY: *FEVER GREATER THAN 100.4 F (38 C) OR HIGHER *CHILLS OR SWEATING *NAUSEA AND VOMITING THAT IS NOT CONTROLLED WITH YOUR NAUSEA MEDICATION *UNUSUAL SHORTNESS OF BREATH *UNUSUAL BRUISING OR BLEEDING *URINARY PROBLEMS (pain or burning when urinating, or frequent urination) *BOWEL PROBLEMS (unusual diarrhea, constipation, pain near the anus) TENDERNESS IN MOUTH AND THROAT WITH OR WITHOUT PRESENCE OF ULCERS (sore throat, sores in mouth, or a toothache) UNUSUAL RASH, SWELLING OR PAIN  UNUSUAL VAGINAL DISCHARGE OR ITCHING   Items with * indicate a potential emergency and should be followed up as soon as possible or go to the Emergency Department if any problems should occur.  Please show the CHEMOTHERAPY ALERT CARD or IMMUNOTHERAPY ALERT CARD at check-in to  the Emergency Department and triage nurse.  Should you have questions after your visit or need to cancel or reschedule your appointment, please contact [facility]  Dept: [phone]  and follow the prompts.  Office hours are 8:00 a.m. to 4:30 p.m. Monday - Friday. Please note that voicemails left after 4:00 p.m. may not be returned until the following business day.  We are closed weekends and major holidays. You have access to a nurse at all times for urgent questions. Please call the main number to the clinic Dept: [phone] and follow the prompts.   For any non-urgent questions, you may also contact your provider using MyChart. We now offer e-Visits for anyone 18 and older to request care online for non-urgent symptoms. For details visit [email or web address].   Also download the MyChart app! Go to the app store, search "MyChart", open the app, select [facility], and log in with your MyChart username and password.  Masks are optional in the cancer centers. If you would like for your care team to wear a mask while they are taking care of you, please let them know. You may have one support person who is at least 67 years old accompany you for your appointments. 

## 2022-01-08 DIAGNOSIS — J9601 Acute respiratory failure with hypoxia: Secondary | ICD-10-CM | POA: Diagnosis not present

## 2022-01-08 DIAGNOSIS — I5022 Chronic systolic (congestive) heart failure: Secondary | ICD-10-CM | POA: Diagnosis not present

## 2022-01-10 DIAGNOSIS — R002 Palpitations: Secondary | ICD-10-CM | POA: Diagnosis not present

## 2022-01-14 ENCOUNTER — Other Ambulatory Visit: Payer: Self-pay | Admitting: *Deleted

## 2022-01-14 ENCOUNTER — Encounter: Payer: Self-pay | Admitting: *Deleted

## 2022-01-14 NOTE — Patient Outreach (Signed)
  Care Coordination   Initial Visit Note   01/14/2022 Name: [name] MRN: [id number] DOB: 11/18/1955  [name] is a 66 y.o. year old male who sees [name] Samples, [place] for primary care. I spoke with  Mattie Marlin Jurek by phone today.  What matters to the patients health and wellness today?  Diagnosed with cancer in January this year.  Completed chemo and radiation, now will have immunotherapy treatment every 4 weeks for the next year.  Report usually have AWV with PCP in October.  Discusses cost of Breztri and Eliquis being expensive, paid almost $400 for the two about two weeks ago.   Otherwise, state he is "good."  Monitors blood pressure, heart rate, and blood sugar every other day, all are reportedly stable.     Goals Addressed               This Visit's Progress     Decrease cost of medications (pt-stated)        Care Coordination Interventions: Patient interviewed about adult health maintenance status including  Depression screen    Falls risk assessment    Blood Pressure    Hemoglobin A1c    Advised patient to discuss  AWV with primary care provider  Provided education about pharmacy team and medication assistance, referral placed         SDOH assessments and interventions completed:  Yes  SDOH Interventions Today    Flowsheet Row Most Recent Value  SDOH Interventions   Food Insecurity Interventions Intervention Not Indicated  Financial Strain Interventions Intervention Not Indicated  Housing Interventions Intervention Not Indicated  Transportation Interventions Intervention Not Indicated        Care Coordination Interventions Activated:  Yes  Care Coordination Interventions:  Yes, provided   Follow up plan: Referral made to pharmacy team    Encounter Outcome:  Pt. Visit Completed   Valente David, RN, MSN, [facility] Care Management Coordinator [phone]

## 2022-01-14 NOTE — Patient Instructions (Signed)
Visit Information  Thank you for taking time to visit with me today. Please don't hesitate to contact me if I can be of assistance to you.  Following are the goals we discussed today:  Schedule AWV with PCP  Please call the Suicide and Crisis Lifeline: 988 call the [place] National Suicide Prevention Lifeline: 8481736501 or TTY: (608)111-4819 TTY 919-887-9558) to talk to a trained counselor call 1-800-273-TALK (toll free, 24 hour hotline) call the [facility]: [phone] call 911 if you are experiencing a Mental Health or [place] or need someone to talk to.  Patient verbalizes understanding of instructions and care plan provided today and agrees to view in [facility]. Active MyChart status and patient understanding of how to access instructions and care plan via MyChart confirmed with patient.     The patient has been provided with contact information for the care management team and has been advised to call with any health related questions or concerns.   Valente David, RN, MSN, [facility] Care Management Coordinator [phone]

## 2022-01-26 ENCOUNTER — Inpatient Hospital Stay: Payer: Medicare HMO

## 2022-01-26 ENCOUNTER — Inpatient Hospital Stay: Payer: Medicare HMO | Attending: Internal Medicine

## 2022-01-26 ENCOUNTER — Inpatient Hospital Stay (HOSPITAL_BASED_OUTPATIENT_CLINIC_OR_DEPARTMENT_OTHER): Payer: Medicare HMO | Admitting: Internal Medicine

## 2022-01-26 ENCOUNTER — Other Ambulatory Visit: Payer: Medicare HMO

## 2022-01-26 ENCOUNTER — Other Ambulatory Visit: Payer: Self-pay

## 2022-01-26 ENCOUNTER — Ambulatory Visit: Payer: Medicare HMO | Admitting: Physician Assistant

## 2022-01-26 ENCOUNTER — Encounter: Payer: Self-pay | Admitting: Internal Medicine

## 2022-01-26 VITALS — BP 117/73 | HR 79 | Resp 18

## 2022-01-26 VITALS — BP 123/72 | HR 85 | Temp 98.0°F | Resp 16 | Wt 269.0 lb

## 2022-01-26 DIAGNOSIS — C3490 Malignant neoplasm of unspecified part of unspecified bronchus or lung: Secondary | ICD-10-CM | POA: Diagnosis not present

## 2022-01-26 DIAGNOSIS — C3492 Malignant neoplasm of unspecified part of left bronchus or lung: Secondary | ICD-10-CM

## 2022-01-26 DIAGNOSIS — C3412 Malignant neoplasm of upper lobe, left bronchus or lung: Secondary | ICD-10-CM | POA: Diagnosis not present

## 2022-01-26 DIAGNOSIS — Z79899 Other long term (current) drug therapy: Secondary | ICD-10-CM | POA: Diagnosis not present

## 2022-01-26 DIAGNOSIS — Z5112 Encounter for antineoplastic immunotherapy: Secondary | ICD-10-CM | POA: Diagnosis not present

## 2022-01-26 LAB — CBC WITH DIFFERENTIAL (CANCER CENTER ONLY)
Abs Immature Granulocytes: 0.02 10*3/uL (ref 0.00–0.07)
Basophils Absolute: 0 10*3/uL (ref 0.0–0.1)
Basophils Relative: 0 %
Eosinophils Absolute: 0 10*3/uL (ref 0.0–0.5)
Eosinophils Relative: 0 %
HCT: 38.3 % — ABNORMAL LOW (ref 39.0–52.0)
Hemoglobin: 12.8 g/dL — ABNORMAL LOW (ref 13.0–17.0)
Immature Granulocytes: 0 %
Lymphocytes Relative: 9 %
Lymphs Abs: 0.6 10*3/uL — ABNORMAL LOW (ref 0.7–4.0)
MCH: 36.7 pg — ABNORMAL HIGH (ref 26.0–34.0)
MCHC: 33.4 g/dL (ref 30.0–36.0)
MCV: 109.7 fL — ABNORMAL HIGH (ref 80.0–100.0)
Monocytes Absolute: 0.8 10*3/uL (ref 0.1–1.0)
Monocytes Relative: 12 %
Neutro Abs: 5.2 10*3/uL (ref 1.7–7.7)
Neutrophils Relative %: 79 %
Platelet Count: 165 10*3/uL (ref 150–400)
RBC: 3.49 MIL/uL — ABNORMAL LOW (ref 4.22–5.81)
RDW: 14.2 % (ref 11.5–15.5)
WBC Count: 6.7 10*3/uL (ref 4.0–10.5)
nRBC: 0 % (ref 0.0–0.2)

## 2022-01-26 LAB — CMP (CANCER CENTER ONLY)
ALT: 21 U/L (ref 0–44)
AST: 21 U/L (ref 15–41)
Albumin: 4 g/dL (ref 3.5–5.0)
Alkaline Phosphatase: 97 U/L (ref 38–126)
Anion gap: 5 (ref 5–15)
BUN: 23 mg/dL (ref 8–23)
CO2: 34 mmol/L — ABNORMAL HIGH (ref 22–32)
Calcium: 9.5 mg/dL (ref 8.9–10.3)
Chloride: 100 mmol/L (ref 98–111)
Creatinine: 0.98 mg/dL (ref 0.61–1.24)
GFR, Estimated: >60 mL/min (ref >60)
Glucose, Bld: 232 mg/dL — ABNORMAL HIGH (ref 70–99)
Potassium: 4.1 mmol/L (ref 3.5–5.1)
Sodium: 139 mmol/L (ref 135–145)
Total Bilirubin: 0.7 mg/dL (ref 0.3–1.2)
Total Protein: 6.9 g/dL (ref 6.5–8.1)

## 2022-01-26 MED ORDER — SODIUM CHLORIDE 0.9 % IV SOLN: Freq: Once | INTRAVENOUS | Status: AC

## 2022-01-26 MED ORDER — SODIUM CHLORIDE 0.9 % IV SOLN
1500.0000 mg | Freq: Once | INTRAVENOUS | Status: AC
  Administered 2022-01-26: 1500 mg via INTRAVENOUS
  Filled 2022-01-26: qty 30

## 2022-01-26 NOTE — Patient Instructions (Signed)
Steps to Quit Smoking Smoking tobacco is the leading cause of preventable death. It can affect almost every organ in the body. Smoking puts you and people around you at risk for many serious, long-lasting (chronic) diseases. Quitting smoking can be hard, but it is one of the best things that you can do for your health. It is never too late to quit. Do not give up if you cannot quit the first time. Some people need to try many times to quit. Do your best to stick to your quit plan, and talk with your doctor if you have any questions or concerns. How do I get ready to quit? Pick a date to quit. Set a date within the next 2 weeks to give you time to prepare. Write down the reasons why you are quitting. Keep this list in places where you will see it often. Tell your family, friends, and co-workers that you are quitting. Their support is important. Talk with your doctor about the choices that may help you quit. Find out if your health insurance will pay for these treatments. Know the people, places, things, and activities that make you want to smoke (triggers). Avoid them. What first steps can I take to quit smoking? Throw away all cigarettes at home, at work, and in your car. Throw away the things that you use when you smoke, such as ashtrays and lighters. Clean your car. Empty the ashtray. Clean your home, including curtains and carpets. What can I do to help me quit smoking? Talk with your doctor about taking medicines and seeing a counselor. You are more likely to succeed when you do both. If you are pregnant or breastfeeding: Talk with your doctor about counseling or other ways to quit smoking. Do not take medicine to help you quit smoking unless your doctor tells you to. Quit right away Quit smoking completely, instead of slowly cutting back on how much you smoke over a period of time. Stopping smoking right away may be more successful than slowly quitting. Go to counseling. In-person is best  if this is an option. You are more likely to quit if you go to counseling sessions regularly. Take medicine You may take medicines to help you quit. Some medicines need a prescription, and some you can buy over-the-counter. Some medicines may contain a drug called nicotine to replace the nicotine in cigarettes. Medicines may: Help you stop having the desire to smoke (cravings). Help to stop the problems that come when you stop smoking (withdrawal symptoms). Your doctor may ask you to use: Nicotine patches, gum, or lozenges. Nicotine inhalers or sprays. Non-nicotine medicine that you take by mouth. Find resources Find resources and other ways to help you quit smoking and remain smoke-free after you quit. They include: Online chats with a counselor. Phone quitlines. Printed self-help materials. Support groups or group counseling. Text messaging programs. Mobile phone apps. Use apps on your mobile phone or tablet that can help you stick to your quit plan. Examples of free services include Quit Guide from the CDC and smokefree.gov  What can I do to make it easier to quit?  Talk to your family and friends. Ask them to support and encourage you. Call a phone quitline, such as 1-800-QUIT-NOW, reach out to support groups, or work with a counselor. Ask people who smoke to not smoke around you. Avoid places that make you want to smoke, such as: Bars. Parties. Smoke-break areas at work. Spend time with people who do not smoke. Lower   the stress in your life. Stress can make you want to smoke. Try these things to lower stress: Getting regular exercise. Doing deep-breathing exercises. Doing yoga. Meditating. What benefits will I see if I quit smoking? Over time, you may have: A better sense of smell and taste. Less coughing and sore throat. A slower heart rate. Lower blood pressure. Clearer skin. Better breathing. Fewer sick days. Summary Quitting smoking can be hard, but it is one of  the best things that you can do for your health. Do not give up if you cannot quit the first time. Some people need to try many times to quit. When you decide to quit smoking, make a plan to help you succeed. Quit smoking right away, not slowly over a period of time. When you start quitting, get help and support to keep you smoke-free. This information is not intended to replace advice given to you by your health care provider. Make sure you discuss any questions you have with your health care provider. Document Revised: 04/30/2021 Document Reviewed: 04/30/2021 Elsevier Patient Education  2023 Elsevier Inc.  

## 2022-01-26 NOTE — Patient Instructions (Signed)
[facility] ONCOLOGY  Discharge Instructions: Thank you for choosing [facility] to provide your oncology and hematology care.   If you have a lab appointment with the [facility], please go directly to the [facility] and check in at the registration area.   Wear comfortable clothing and clothing appropriate for easy access to any Portacath or PICC line.   We strive to give you quality time with your provider. You may need to reschedule your appointment if you arrive late (15 or more minutes).  Arriving late affects you and other patients whose appointments are after yours.  Also, if you miss three or more appointments without notifying the office, you may be dismissed from the clinic at the provider's discretion.      For prescription refill requests, have your pharmacy contact our office and allow 72 hours for refills to be completed.    Today you received the following chemotherapy and/or immunotherapy agents: durvalumab      To help prevent nausea and vomiting after your treatment, we encourage you to take your nausea medication as directed.  BELOW ARE SYMPTOMS THAT SHOULD BE REPORTED IMMEDIATELY: *FEVER GREATER THAN 100.4 F (38 C) OR HIGHER *CHILLS OR SWEATING *NAUSEA AND VOMITING THAT IS NOT CONTROLLED WITH YOUR NAUSEA MEDICATION *UNUSUAL SHORTNESS OF BREATH *UNUSUAL BRUISING OR BLEEDING *URINARY PROBLEMS (pain or burning when urinating, or frequent urination) *BOWEL PROBLEMS (unusual diarrhea, constipation, pain near the anus) TENDERNESS IN MOUTH AND THROAT WITH OR WITHOUT PRESENCE OF ULCERS (sore throat, sores in mouth, or a toothache) UNUSUAL RASH, SWELLING OR PAIN  UNUSUAL VAGINAL DISCHARGE OR ITCHING   Items with * indicate a potential emergency and should be followed up as soon as possible or go to the Emergency Department if any problems should occur.  Please show the CHEMOTHERAPY ALERT CARD or IMMUNOTHERAPY ALERT CARD at check-in to  the Emergency Department and triage nurse.  Should you have questions after your visit or need to cancel or reschedule your appointment, please contact [facility]  Dept: [phone]  and follow the prompts.  Office hours are 8:00 a.m. to 4:30 p.m. Monday - Friday. Please note that voicemails left after 4:00 p.m. may not be returned until the following business day.  We are closed weekends and major holidays. You have access to a nurse at all times for urgent questions. Please call the main number to the clinic Dept: [phone] and follow the prompts.   For any non-urgent questions, you may also contact your provider using MyChart. We now offer e-Visits for anyone 45 and older to request care online for non-urgent symptoms. For details visit [email or web address].   Also download the MyChart app! Go to the app store, search "MyChart", open the app, select [facility], and log in with your MyChart username and password.  Masks are optional in the cancer centers. If you would like for your care team to wear a mask while they are taking care of you, please let them know. You may have one support person who is at least 67 years old accompany you for your appointments.

## 2022-01-26 NOTE — Progress Notes (Signed)
[place] [phone]   Fax:(336) [phone]  OFFICE PROGRESS NOTE  [name], [place] [address]  DIAGNOSIS:  Stage IIIb (T3, N2, M0) non-small cell lung cancer, squamous cell carcinoma presented with large left upper lobe lung mass in addition to mediastinal lymphadenopathy diagnosed in April 2023. The patient has a history of colon cancer treated at [facility] in 2014 status post surgical resection followed by adjuvant systemic chemotherapy.  PRIOR THERAPY: Concurrent chemoradiation with weekly carboplatin for AUC of 2 and paclitaxel 45 Mg/M2.  Status post 7 cycle.  Last cycle was given on 10/26/2021 with partial response.  CURRENT THERAPY: Consolidation treatment with immunotherapy with Imfinzi 1500 Mg IV every 4 weeks.  First dose December 01, 2021.  Status post 2 cycles.   INTERVAL HISTORY: [name] 66 y.o. male returns to the clinic today for follow-up visit accompanied by his wife.  The patient is feeling fine today with no concerning complaints except for the baseline shortness of breath and he is currently on home oxygen 3 L/minute nasal cannula and his oxygen saturation around 94%.  He denied having any chest pain but has mild cough with no hemoptysis.  He has no nausea, vomiting, diarrhea or constipation.  He has no headache or visual changes.  He has no fever or chills.  He lost few pounds since his last visit.  The patient has been tolerating his treatment with immunotherapy fairly well.  He is here today for evaluation before starting cycle #3.   MEDICAL HISTORY: Past Medical History:  Diagnosis Date   Atrial flutter ([place])    s/p DCCV 06/03/21   CHF (congestive heart failure) ([place])    Colon cancer ([place]) 04/22/2013   Hypertension    Lung cancer ([place]) 08/26/2021   Renal disorder    Kidney stones    ALLERGIES:  is allergic to paclitaxel and penicillins.  MEDICATIONS:  Current  Outpatient Medications  Medication Sig Dispense Refill   acetaminophen (TYLENOL) 325 MG tablet Take 2 tablets (650 mg total) by mouth every 6 (six) hours as needed for mild pain (or Fever >/= 101). 12 tablet 0   apixaban (ELIQUIS) 5 MG TABS tablet Take 1 tablet (5 mg total) by mouth 2 (two) times daily. 60 tablet 2   atorvastatin (LIPITOR) 20 MG tablet Take 1 tablet (20 mg total) by mouth every evening. 30 tablet 3   bisoprolol (ZEBETA) 5 MG tablet Take 1 tablet (5 mg total) by mouth daily. For BP and Heart Rate Control 30 tablet 5   Budeson-Glycopyrrol-Formoterol (BREZTRI AEROSPHERE) 160-9-4.8 MCG/ACT AERO Inhale 2 puffs into the lungs in the morning and at bedtime. 10.7 g 0   glimepiride (AMARYL) 2 MG tablet Take 1 tablet (2 mg total) by mouth every morning. 30 tablet 11   Multiple Vitamin (MULTI-VITAMIN DAILY PO) Take 1 tablet by mouth daily.     potassium chloride (KLOR-CON) 10 MEQ tablet Take 1 tablet (10 mEq total) by mouth daily. Take While taking Lasix/furosemide 30 tablet 2   prochlorperazine (COMPAZINE) 10 MG tablet Take 1 tablet (10 mg total) by mouth every 6 (six) hours as needed for nausea or vomiting. 30 tablet 0   torsemide 40 MG TABS Take 40 mg by mouth every morning. 30 tablet 2   No current facility-administered medications for this visit.    SURGICAL HISTORY:  Past Surgical History:  Procedure Laterality Date   BRONCHIAL BIOPSY  08/26/2021  Procedure: BRONCHIAL BIOPSIES;  Surgeon: Garner Nash, DO;  Location: [facility];  Service: Pulmonary;;   BRONCHIAL BRUSHINGS  08/26/2021   Procedure: BRONCHIAL BRUSHINGS;  Surgeon: Garner Nash, DO;  Location: [facility];  Service: Pulmonary;;   BRONCHIAL NEEDLE ASPIRATION BIOPSY  08/26/2021   Procedure: BRONCHIAL NEEDLE ASPIRATION BIOPSIES;  Surgeon: Garner Nash, DO;  Location: [facility];  Service: Pulmonary;;   CARDIOVERSION N/A 06/03/2021   Procedure: CARDIOVERSION;  Surgeon: Satira Sark, MD;  [facility]: [facility];   Service: Cardiovascular;  Laterality: N/A;   COLONOSCOPY N/A 04/29/2013   Procedure: COLONOSCOPY;  Surgeon: Danie Binder, MD;  [facility];  Service: Endoscopy;  Laterality: N/A;  10:30AM   Cyst removed from left elbow     ENDOVENOUS ABLATION SAPHENOUS VEIN W/ LASER Left 01/16/2019   endovenous laser ablation left greater saphenous vein by Ruta Hinds MD   KIDNEY STONE SURGERY     LITHOTRIPSY     PARTIAL COLECTOMY     TEE WITHOUT CARDIOVERSION N/A 06/03/2021   Procedure: TRANSESOPHAGEAL ECHOCARDIOGRAM (TEE);  Surgeon: Satira Sark, MD;  Location: [facility];  Service: Cardiovascular;  Laterality: N/A;   VIDEO BRONCHOSCOPY WITH ENDOBRONCHIAL ULTRASOUND Bilateral 08/26/2021   Procedure: VIDEO BRONCHOSCOPY WITH ENDOBRONCHIAL ULTRASOUND;  Surgeon: Garner Nash, DO;  Location: [facility];  Service: Pulmonary;  Laterality: Bilateral;  will need Guardant [id number]    REVIEW OF SYSTEMS:  A comprehensive review of systems was negative except for: Constitutional: positive for fatigue Respiratory: positive for cough and dyspnea on exertion   PHYSICAL EXAMINATION: General appearance: alert, cooperative, fatigued, and no distress Head: Normocephalic, without obvious abnormality, atraumatic Neck: no adenopathy, no JVD, supple, symmetrical, trachea midline, and thyroid not enlarged, symmetric, no tenderness/mass/nodules Lymph nodes: Cervical, supraclavicular, and axillary nodes normal. Resp: clear to auscultation bilaterally Back: symmetric, no curvature. ROM normal. No CVA tenderness. Cardio: regular rate and rhythm, S1, S2 normal, no murmur, click, rub or gallop GI: soft, non-tender; bowel sounds normal; no masses,  no organomegaly Extremities: extremities normal, atraumatic, no cyanosis or edema  ECOG PERFORMANCE STATUS: 1 - Symptomatic but completely ambulatory  Blood pressure 123/72, pulse 85, temperature 98 F (36.7 C), temperature source Oral, resp. rate 16, weight 269 lb  (122 kg), SpO2 94 %.  LABORATORY DATA: Lab Results  Component Value Date   WBC 6.7 01/26/2022   HGB 12.8 (L) 01/26/2022   HCT 38.3 (L) 01/26/2022   MCV 109.7 (H) 01/26/2022   PLT 165 01/26/2022      Chemistry      Component Value Date/Time   NA 141 12/29/2021 1030   K 4.7 12/29/2021 1030   CL 103 12/29/2021 1030   CO2 34 (H) 12/29/2021 1030   BUN 15 12/29/2021 1030   CREATININE 0.88 12/29/2021 1030      Component Value Date/Time   CALCIUM 9.4 12/29/2021 1030   ALKPHOS 86 12/29/2021 1030   AST 19 12/29/2021 1030   ALT 22 12/29/2021 1030   BILITOT 0.7 12/29/2021 1030       RADIOGRAPHIC STUDIES: No results found.  ASSESSMENT AND PLAN: This is a very pleasant 66 years old white male with  stage IIIb (T3, N2, M0) non-small cell lung cancer, squamous cell carcinoma presented with large left upper lobe lung mass in addition to mediastinal lymphadenopathy diagnosed in April 2023. The patient has a history of colon cancer treated at [facility] in 2014 status post surgical resection followed by adjuvant systemic chemotherapy. The patient  underwent a course of concurrent chemoradiation with weekly carboplatin for AUC of 2 and paclitaxel 45 Mg/M2 status post 7 cycle.  Last cycle was given on 10/26/2021. The patient tolerated the previous course of his treatment fairly well except for the mild odynophagia which significantly improved.  He continues to have baseline shortness of breath secondary to his COPD and radiation. The patient is currently undergoing treatment with immunotherapy with Imfinzi 1500 Mg IV every 4 weeks status post 2 cycles.   He has been tolerating this treatment fairly well with no concerning adverse effects. I recommended for the patient to proceed with cycle #3 today as planned. I will see him back for follow-up visit in 4 weeks for evaluation with repeat CT scan of the chest for restaging of his disease. For the COPD, I strongly encouraged the patient  to continue on the home oxygen and to monitor his oxygen saturation closely at home. The patient was advised to call immediately if he has any other concerning symptoms in the interval. The patient voices understanding of current disease status and treatment options and is in agreement with the current care plan.  All questions were answered. The patient knows to call the clinic with any problems, questions or concerns. We can certainly see the patient much sooner if necessary.  The total time spent in the appointment was 20 minutes.  Disclaimer: This note was dictated with voice recognition software. Similar sounding words can inadvertently be transcribed and may not be corrected upon review.

## 2022-02-08 DIAGNOSIS — J9601 Acute respiratory failure with hypoxia: Secondary | ICD-10-CM | POA: Diagnosis not present

## 2022-02-08 DIAGNOSIS — I5022 Chronic systolic (congestive) heart failure: Secondary | ICD-10-CM | POA: Diagnosis not present

## 2022-02-14 ENCOUNTER — Telehealth: Payer: Self-pay | Admitting: Internal Medicine

## 2022-02-14 NOTE — Telephone Encounter (Signed)
Called patient regarding upcoming October and November appointments, left a voicemail.

## 2022-02-15 ENCOUNTER — Telehealth: Payer: Self-pay | Admitting: Cardiology

## 2022-02-15 MED ORDER — BISOPROLOL FUMARATE 5 MG PO TABS
7.5000 mg | ORAL_TABLET | Freq: Every day | ORAL | 3 refills | Status: AC
Start: 2022-02-15 — End: ?

## 2022-02-15 NOTE — Telephone Encounter (Signed)
Cameron Lenis, MD  02/07/2022  3:43 PM EDT     Some extra heart beats at times on monitor, sometimes can have a few in a row. Nothing worrisome but sometimes can cause feeling of heart fluttering or beating irregular. If having a lot of symptoms still we could increase his bisoprolol to 7.5mg  daily   Zandra Abts MD

## 2022-02-15 NOTE — Telephone Encounter (Signed)
Patient notified of results. Patient agreeable to having medication increased as pt was having more symptoms. Pt had no further questions or concerns.

## 2022-02-15 NOTE — Telephone Encounter (Signed)
Patient returning call for monitor results. 

## 2022-02-21 ENCOUNTER — Ambulatory Visit (HOSPITAL_COMMUNITY)
Admission: RE | Admit: 2022-02-21 | Discharge: 2022-02-21 | Disposition: A | Discharge status: Home / Self Care | Payer: Medicare HMO | Source: Ambulatory Visit | Attending: Internal Medicine | Admitting: Internal Medicine

## 2022-02-21 DIAGNOSIS — R911 Solitary pulmonary nodule: Secondary | ICD-10-CM | POA: Diagnosis not present

## 2022-02-21 DIAGNOSIS — J90 Pleural effusion, not elsewhere classified: Secondary | ICD-10-CM | POA: Diagnosis not present

## 2022-02-21 DIAGNOSIS — C3490 Malignant neoplasm of unspecified part of unspecified bronchus or lung: Secondary | ICD-10-CM | POA: Diagnosis not present

## 2022-02-21 MED ORDER — SODIUM CHLORIDE (PF) 0.9 % IJ SOLN
INTRAMUSCULAR | Status: AC
  Filled 2022-02-21: qty 50

## 2022-02-21 MED ORDER — IOHEXOL 300 MG/ML  SOLN
75.0000 mL | Freq: Once | INTRAMUSCULAR | Status: AC | PRN
  Administered 2022-02-21: 75 mL via INTRAVENOUS

## 2022-02-23 ENCOUNTER — Other Ambulatory Visit: Payer: Self-pay | Admitting: Internal Medicine

## 2022-02-23 ENCOUNTER — Inpatient Hospital Stay (HOSPITAL_BASED_OUTPATIENT_CLINIC_OR_DEPARTMENT_OTHER): Payer: Medicare HMO | Admitting: Internal Medicine

## 2022-02-23 ENCOUNTER — Encounter: Payer: Self-pay | Admitting: Internal Medicine

## 2022-02-23 ENCOUNTER — Inpatient Hospital Stay: Payer: Medicare HMO

## 2022-02-23 ENCOUNTER — Inpatient Hospital Stay: Payer: Medicare HMO | Attending: Internal Medicine

## 2022-02-23 DIAGNOSIS — C3492 Malignant neoplasm of unspecified part of left bronchus or lung: Secondary | ICD-10-CM

## 2022-02-23 DIAGNOSIS — D508 Other iron deficiency anemias: Secondary | ICD-10-CM

## 2022-02-23 DIAGNOSIS — Z5112 Encounter for antineoplastic immunotherapy: Secondary | ICD-10-CM | POA: Insufficient documentation

## 2022-02-23 DIAGNOSIS — C3412 Malignant neoplasm of upper lobe, left bronchus or lung: Secondary | ICD-10-CM | POA: Diagnosis not present

## 2022-02-23 DIAGNOSIS — Z79899 Other long term (current) drug therapy: Secondary | ICD-10-CM | POA: Diagnosis not present

## 2022-02-23 LAB — CMP (CANCER CENTER ONLY)
ALT: 20 U/L (ref 0–44)
AST: 20 U/L (ref 15–41)
Albumin: 4 g/dL (ref 3.5–5.0)
Alkaline Phosphatase: 98 U/L (ref 38–126)
Anion gap: 6 (ref 5–15)
BUN: 17 mg/dL (ref 8–23)
CO2: 39 mmol/L — ABNORMAL HIGH (ref 22–32)
Calcium: 9.2 mg/dL (ref 8.9–10.3)
Chloride: 97 mmol/L — ABNORMAL LOW (ref 98–111)
Creatinine: 1.04 mg/dL (ref 0.61–1.24)
GFR, Estimated: >60 mL/min (ref >60)
Glucose, Bld: 205 mg/dL — ABNORMAL HIGH (ref 70–99)
Potassium: 4.7 mmol/L (ref 3.5–5.1)
Sodium: 142 mmol/L (ref 135–145)
Total Bilirubin: 0.9 mg/dL (ref 0.3–1.2)
Total Protein: 6.5 g/dL (ref 6.5–8.1)

## 2022-02-23 LAB — CBC WITH DIFFERENTIAL (CANCER CENTER ONLY)
Abs Immature Granulocytes: 0.02 10*3/uL (ref 0.00–0.07)
Basophils Absolute: 0 10*3/uL (ref 0.0–0.1)
Basophils Relative: 0 %
Eosinophils Absolute: 0 10*3/uL (ref 0.0–0.5)
Eosinophils Relative: 0 %
HCT: 38.9 % — ABNORMAL LOW (ref 39.0–52.0)
Hemoglobin: 12.5 g/dL — ABNORMAL LOW (ref 13.0–17.0)
Immature Granulocytes: 0 %
Lymphocytes Relative: 10 %
Lymphs Abs: 0.6 10*3/uL — ABNORMAL LOW (ref 0.7–4.0)
MCH: 35.1 pg — ABNORMAL HIGH (ref 26.0–34.0)
MCHC: 32.1 g/dL (ref 30.0–36.0)
MCV: 109.3 fL — ABNORMAL HIGH (ref 80.0–100.0)
Monocytes Absolute: 0.6 10*3/uL (ref 0.1–1.0)
Monocytes Relative: 10 %
Neutro Abs: 4.8 10*3/uL (ref 1.7–7.7)
Neutrophils Relative %: 80 %
Platelet Count: 148 10*3/uL — ABNORMAL LOW (ref 150–400)
RBC: 3.56 MIL/uL — ABNORMAL LOW (ref 4.22–5.81)
RDW: 14.3 % (ref 11.5–15.5)
WBC Count: 6.1 10*3/uL (ref 4.0–10.5)
nRBC: 0 % (ref 0.0–0.2)

## 2022-02-23 LAB — IRON AND IRON BINDING CAPACITY (CC-WL,HP ONLY)
Iron: 60 ug/dL (ref 45–182)
Saturation Ratios: 16 % — ABNORMAL LOW (ref 17.9–39.5)
TIBC: 368 ug/dL (ref 250–450)
UIBC: 308 ug/dL (ref 117–376)

## 2022-02-23 LAB — FERRITIN: Ferritin: 101 ng/mL (ref 24–336)

## 2022-02-23 LAB — TSH: TSH: 1.698 u[IU]/mL (ref 0.350–4.500)

## 2022-02-23 MED ORDER — SODIUM CHLORIDE 0.9 % IV SOLN
1500.0000 mg | Freq: Once | INTRAVENOUS | Status: AC
  Administered 2022-02-23: 1500 mg via INTRAVENOUS
  Filled 2022-02-23: qty 30

## 2022-02-23 MED ORDER — SODIUM CHLORIDE 0.9 % IV SOLN: Freq: Once | INTRAVENOUS | Status: AC

## 2022-02-23 NOTE — Progress Notes (Signed)
[place] Telephone:(336) [phone]   Fax:(336) [phone]  OFFICE PROGRESS NOTE  [name], [place] [address]  DIAGNOSIS:  Stage IIIb (T3, N2, M0) non-small cell lung cancer, squamous cell carcinoma presented with large left upper lobe lung mass in addition to mediastinal lymphadenopathy diagnosed in April 2023. The patient has a history of colon cancer treated at [facility] in 2014 status post surgical resection followed by adjuvant systemic chemotherapy.  PRIOR THERAPY: Concurrent chemoradiation with weekly carboplatin for AUC of 2 and paclitaxel 45 Mg/M2.  Status post 7 cycle.  Last cycle was given on 10/26/2021 with partial response.  CURRENT THERAPY: Consolidation treatment with immunotherapy with Imfinzi 1500 Mg IV every 4 weeks.  First dose December 01, 2021.  Status post 3 cycles.   INTERVAL HISTORY: [name] 66 y.o. male returns to the clinic today for follow-up visit accompanied by his wife.  The patient is feeling fine today with no concerning complaints except for the baseline shortness of breath and he is currently on home oxygen secondary to COPD.  He also has mild fatigue.  He denied having any chest pain, cough or hemoptysis.  He has no nausea, vomiting, diarrhea or constipation.  He has no headache or visual changes.  He has no recent weight loss or night sweats.  He has been tolerating his treatment with consolidation immunotherapy fairly well.  He is here today for evaluation with repeat CT scan of the chest for restaging of his disease before starting cycle #4 of his treatment.  MEDICAL HISTORY: Past Medical History:  Diagnosis Date   Atrial flutter ([place])    s/p DCCV 06/03/21   CHF (congestive heart failure) ([place])    Colon cancer ([place]) 04/22/2013   Hypertension    Lung cancer ([place]) 08/26/2021   Renal disorder    Kidney stones    ALLERGIES:  is allergic to paclitaxel and  penicillins.  MEDICATIONS:  Current Outpatient Medications  Medication Sig Dispense Refill   acetaminophen (TYLENOL) 325 MG tablet Take 2 tablets (650 mg total) by mouth every 6 (six) hours as needed for mild pain (or Fever >/= 101). 12 tablet 0   apixaban (ELIQUIS) 5 MG TABS tablet Take 1 tablet (5 mg total) by mouth 2 (two) times daily. 60 tablet 2   atorvastatin (LIPITOR) 20 MG tablet Take 1 tablet (20 mg total) by mouth every evening. 30 tablet 3   bisoprolol (ZEBETA) 5 MG tablet Take 1.5 tablets (7.5 mg total) by mouth daily. 135 tablet 3   Budeson-Glycopyrrol-Formoterol (BREZTRI AEROSPHERE) 160-9-4.8 MCG/ACT AERO Inhale 2 puffs into the lungs in the morning and at bedtime. 10.7 g 0   glimepiride (AMARYL) 2 MG tablet Take 1 tablet (2 mg total) by mouth every morning. 30 tablet 11   Multiple Vitamin (MULTI-VITAMIN DAILY PO) Take 1 tablet by mouth daily.     potassium chloride (KLOR-CON) 10 MEQ tablet Take 1 tablet (10 mEq total) by mouth daily. Take While taking Lasix/furosemide 30 tablet 2   prochlorperazine (COMPAZINE) 10 MG tablet Take 1 tablet (10 mg total) by mouth every 6 (six) hours as needed for nausea or vomiting. 30 tablet 0   torsemide 40 MG TABS Take 40 mg by mouth every morning. 30 tablet 2   No current facility-administered medications for this visit.    SURGICAL HISTORY:  Past Surgical History:  Procedure Laterality Date   BRONCHIAL BIOPSY  08/26/2021   Procedure: BRONCHIAL  BIOPSIES;  Surgeon: Garner Nash, DO;  Location: [facility];  Service: Pulmonary;;   BRONCHIAL BRUSHINGS  08/26/2021   Procedure: BRONCHIAL BRUSHINGS;  Surgeon: Garner Nash, DO;  Location: [facility];  Service: Pulmonary;;   BRONCHIAL NEEDLE ASPIRATION BIOPSY  08/26/2021   Procedure: BRONCHIAL NEEDLE ASPIRATION BIOPSIES;  Surgeon: Garner Nash, DO;  Location: [facility];  Service: Pulmonary;;   CARDIOVERSION N/A 06/03/2021   Procedure: CARDIOVERSION;  Surgeon: Satira Sark, MD;   Location: [facility];  Service: Cardiovascular;  Laterality: N/A;   COLONOSCOPY N/A 04/29/2013   Procedure: COLONOSCOPY;  Surgeon: Danie Binder, MD;  [facility];  Service: Endoscopy;  Laterality: N/A;  10:30AM   Cyst removed from left elbow     ENDOVENOUS ABLATION SAPHENOUS VEIN W/ LASER Left 01/16/2019   endovenous laser ablation left greater saphenous vein by Ruta Hinds MD   KIDNEY STONE SURGERY     LITHOTRIPSY     PARTIAL COLECTOMY     TEE WITHOUT CARDIOVERSION N/A 06/03/2021   Procedure: TRANSESOPHAGEAL ECHOCARDIOGRAM (TEE);  Surgeon: Satira Sark, MD;  Location: [facility];  Service: Cardiovascular;  Laterality: N/A;   VIDEO BRONCHOSCOPY WITH ENDOBRONCHIAL ULTRASOUND Bilateral 08/26/2021   Procedure: VIDEO BRONCHOSCOPY WITH ENDOBRONCHIAL ULTRASOUND;  Surgeon: Garner Nash, DO;  Location: [facility];  Service: Pulmonary;  Laterality: Bilateral;  will need Guardant 360CDX    REVIEW OF SYSTEMS:  Constitutional: positive for fatigue Eyes: negative Ears, nose, mouth, throat, and face: negative Respiratory: positive for dyspnea on exertion Cardiovascular: negative Gastrointestinal: negative Genitourinary:negative Integument/breast: negative Hematologic/lymphatic: negative Musculoskeletal:negative Neurological: negative Behavioral/Psych: negative Endocrine: negative Allergic/Immunologic: negative   PHYSICAL EXAMINATION: General appearance: alert, cooperative, fatigued, and no distress Head: Normocephalic, without obvious abnormality, atraumatic Neck: no adenopathy, no JVD, supple, symmetrical, trachea midline, and thyroid not enlarged, symmetric, no tenderness/mass/nodules Lymph nodes: Cervical, supraclavicular, and axillary nodes normal. Resp: clear to auscultation bilaterally Back: symmetric, no curvature. ROM normal. No CVA tenderness. Cardio: regular rate and rhythm, S1, S2 normal, no murmur, click, rub or gallop GI: soft, non-tender; bowel sounds normal; no  masses,  no organomegaly Extremities: extremities normal, atraumatic, no cyanosis or edema Neurologic: Alert and oriented X 3, normal strength and tone. Normal symmetric reflexes. Normal coordination and gait  ECOG PERFORMANCE STATUS: 1 - Symptomatic but completely ambulatory  Blood pressure 115/69, pulse 81, temperature 97.6 F (36.4 C), temperature source Oral, resp. rate 17, weight 270 lb 3 oz (122.6 kg), SpO2 90 %.  LABORATORY DATA: Lab Results  Component Value Date   WBC 6.1 02/23/2022   HGB 12.5 (L) 02/23/2022   HCT 38.9 (L) 02/23/2022   MCV 109.3 (H) 02/23/2022   PLT 148 (L) 02/23/2022      Chemistry      Component Value Date/Time   NA 142 02/23/2022 0922   K 4.7 02/23/2022 0922   CL 97 (L) 02/23/2022 0922   CO2 39 (H) 02/23/2022 0922   BUN 17 02/23/2022 0922   CREATININE 1.04 02/23/2022 0922      Component Value Date/Time   CALCIUM 9.2 02/23/2022 0922   ALKPHOS 98 02/23/2022 0922   AST 20 02/23/2022 0922   ALT 20 02/23/2022 0922   BILITOT 0.9 02/23/2022 0922       RADIOGRAPHIC STUDIES: CT Chest W Contrast  Result Date: 02/23/2022 CLINICAL DATA:  Non-small cell lung cancer. Restaging. * Tracking Code: BO * EXAM: CT CHEST WITH CONTRAST TECHNIQUE: Multidetector CT imaging of the chest was performed during intravenous contrast administration.  RADIATION DOSE REDUCTION: This exam was performed according to the departmental dose-optimization program which includes automated exposure control, adjustment of the mA and/or kV according to patient size and/or use of iterative reconstruction technique. CONTRAST:  56mL OMNIPAQUE IOHEXOL 300 MG/ML  SOLN COMPARISON:  CT 11/18/2021 FINDINGS: Cardiovascular: Mediastinum/Nodes: Lymph node adjacent to the LEFT main pulmonary artery measures 6 mm (image 61/2) decreased from 17 mm. This node was intensely hypermetabolic on comparison PET-CT scan. No measurable hilar adenopathy on the LEFT. No contralateral adenopathy. NO supraclavicular  adenopathy. Lungs/Pleura: Lingular mass is decreased in size measuring 3.1 x 1.5 cm compared to 3.3 x 1.8 cm most recent CT comparison. Cavitary portion of the mass has collapsed. There is interstitial thickening in the lingula surrounding the mass presumably related to radiation change. There is a new moderate size LEFT pleural effusion. Small LEFT lobe pulmonary nodule measuring 5 mm (57/)7 not clearly seen on prior. No RIGHT lung lesions. Upper Abdomen: Limited view of the liver, kidneys, pancreas are unremarkable. Normal adrenal glands. Musculoskeletal: No aggressive osseous lesion. IMPRESSION: 1. Interval decrease in size of lingular mass. 2. New radiation change in the lingula and new pleural fluid 3. New small LEFT lower lobe pulmonary nodule may be inflammatory. Recommend attention on routine follow-up. 4. Reduction in size of previously hypermetabolic LEFT mediastinal lymph node . Electronically Signed   By: Suzy Bouchard M.D.   On: 02/23/2022 08:50    ASSESSMENT AND PLAN: This is a very pleasant 66 years old white male with  stage IIIb (T3, N2, M0) non-small cell lung cancer, squamous cell carcinoma presented with large left upper lobe lung mass in addition to mediastinal lymphadenopathy diagnosed in April 2023. The patient has a history of colon cancer treated at [facility] in 2014 status post surgical resection followed by adjuvant systemic chemotherapy. The patient underwent a course of concurrent chemoradiation with weekly carboplatin for AUC of 2 and paclitaxel 45 Mg/M2 status post 7 cycle.  Last cycle was given on 10/26/2021. The patient tolerated the previous course of his treatment fairly well except for the mild odynophagia which significantly improved.  He continues to have baseline shortness of breath secondary to his COPD and radiation. The patient is currently undergoing treatment with immunotherapy with Imfinzi 1500 Mg IV every 4 weeks status post 3 cycles.   The patient  has been tolerating this treatment well with no concerning adverse effects. He had repeat CT scan of the chest performed recently.  I personally and independently reviewed the scan and discussed the result with the patient and his wife. His scan showed further decrease in the size of the lingular mass as well as the mediastinal lymphadenopathy.  He has new small left lower lobe pulmonary nodule that likely inflammatory and we will continue to monitor it closely. I recommended for the patient to continue his consolidation treatment with immunotherapy and he will proceed with cycle #4 today. I will see him back for follow-up visit in 4 weeks for evaluation before starting cycle #5. For the COPD, the patient will continue on his home oxygen. The patient was advised to call immediately if he has any other concerning symptoms in the interval. The patient voices understanding of current disease status and treatment options and is in agreement with the current care plan.  All questions were answered. The patient knows to call the clinic with any problems, questions or concerns. We can certainly see the patient much sooner if necessary.  The total time spent in  the appointment was 30 minutes.  Disclaimer: This note was dictated with voice recognition software. Similar sounding words can inadvertently be transcribed and may not be corrected upon review.

## 2022-02-23 NOTE — Patient Instructions (Signed)
[facility]COL[facility]  Discharge Instructions: Thank you for choosing [facility] to provide your oncology and hematology care.   If you have a lab appointment with the [facility], please go directly to the [facility] and check in at the registration area.   Wear comfortable clothing and clothing appropriate for easy access to any Portacath or PICC line.   We strive to give you quality time with your provider. You may need to reschedule your appointment if you arrive late (15 or more minutes).  Arriving late affects you and other patients whose appointments are after yours.  Also, if you miss three or more appointments without notifying the office, you may be dismissed from the clinic at the provider's discretion.      For prescription refill requests, have your pharmacy contact our office and allow 72 hours for refills to be completed.    Today you received the following chemotherapy and/or immunotherapy agents: imfinzi      To help prevent nausea and vomiting after your treatment, we encourage you to take your nausea medication as directed.  BELOW ARE SYMPTOMS THAT SHOULD BE REPORTED IMMEDIATELY: *FEVER GREATER THAN 100.4 F (38 C) OR HIGHER *CHILLS OR SWEATING *NAUSEA AND VOMITING THAT IS NOT CONTROLLED WITH YOUR NAUSEA MEDICATION *UNUSUAL SHORTNESS OF BREATH *UNUSUAL BRUISING OR BLEEDING *URINARY PROBLEMS (pain or burning when urinating, or frequent urination) *BOWEL PROBLEMS (unusual diarrhea, constipation, pain near the anus) TENDERNESS IN MOUTH AND THROAT WITH OR WITHOUT PRESENCE OF ULCERS (sore throat, sores in mouth, or a toothache) UNUSUAL RASH, SWELLING OR PAIN  UNUSUAL VAGINAL DISCHARGE OR ITCHING   Items with * indicate a potential emergency and should be followed up as soon as possible or go to the Emergency Department if any problems should occur.  Please show the CHEMOTHERAPY ALERT CARD or IMMUNOTHERAPY ALERT CARD at check-in to  the Emergency Department and triage nurse.  Should you have questions after your visit or need to cancel or reschedule your appointment, please contact [facility]  Dept: [phone]  and follow the prompts.  Office hours are 8:00 a.m. to 4:30 p.m. Monday - Friday. Please note that voicemails left after 4:00 p.m. may not be returned until the following business day.  We are closed weekends and major holidays. You have access to a nurse at all times for urgent questions. Please call the main number to the clinic Dept: [phone] and follow the prompts.   For any non-urgent questions, you may also contact your provider using MyChart. We now offer e-Visits for anyone 34 and older to request care online for non-urgent symptoms. For details visit [email or web address].   Also download the MyChart app! Go to the app store, search "MyChart", open the app, select [facility], and log in with your MyChart username and password.  Masks are optional in the cancer centers. If you would like for your care team to wear a mask while they are taking care of you, please let them know. You may have one support person who is at least 67 years old accompany you for your appointments.

## 2022-02-24 ENCOUNTER — Ambulatory Visit: Payer: Medicare HMO | Attending: Cardiology | Admitting: Cardiology

## 2022-02-24 ENCOUNTER — Encounter: Payer: Self-pay | Admitting: Cardiology

## 2022-02-24 VITALS — BP 108/60 | HR 77 | Ht 72.0 in | Wt 272.2 lb

## 2022-02-24 DIAGNOSIS — I5032 Chronic diastolic (congestive) heart failure: Secondary | ICD-10-CM | POA: Diagnosis not present

## 2022-02-24 DIAGNOSIS — R002 Palpitations: Secondary | ICD-10-CM

## 2022-02-24 DIAGNOSIS — I10 Essential (primary) hypertension: Secondary | ICD-10-CM | POA: Diagnosis not present

## 2022-02-24 DIAGNOSIS — I4892 Unspecified atrial flutter: Secondary | ICD-10-CM

## 2022-02-24 NOTE — Patient Instructions (Signed)
Medication Instructions:  Your physician recommends that you continue on your current medications as directed. Please refer to the Current Medication list given to you today.   Labwork: None  Testing/Procedures: None  Follow-Up: Follow up with Dr. Branch in 6 months.   Any Other Special Instructions Will Be Listed Below (If Applicable).     If you need a refill on your cardiac medications before your next appointment, please call your pharmacy.  

## 2022-02-24 NOTE — Progress Notes (Signed)
Clinical Summary [name] is a 66 y.o.male seen today for follow up of the following medical problems.    1.HFpEF - admit Jan 2023 with volume overload -Jan 2023 echo LVEF 60-65%, D shaped septum, mild RV dysfunction, PASP 47 - diuresed during Jan 2023 admission    - chronic legt leg edema uncanged. Weight at home around 265 lbs and stable.  - compliant with fluid pills. Chronic SOB that is up and down, typically on 3 L all day   2. History of chest pain -2019 nuclear stress no clear ischemia - no recent symptoms.    3.Aflutter - new diagnosis Jan 2023 - 06/03/21 TEE/DCCV - no recent palpitations.  - no bleeding on eliquis, had to hold eliquis for bronchoscopy.    12/2021 monitor: ectopy, 8 runs SVT longest 14 beats.  - symptoms resolved with bisoprolol 7.5 mg - no bleeding on eliquis.    4.COPD -followed by Dr [name]   5. HTN - compliant with meds   6. Dilated aortic root - Jan 2023 echo 4.1 cm   7. OSA screen - needs outpatient eval - wants to wait on sleep eval   8. Lung cancer stage IIIb - followed by oncology, pulmonary - on immunoptherapy  Past Medical History:  Diagnosis Date   Atrial flutter ([place])    s/p DCCV 06/03/21   CHF (congestive heart failure) (H[facility])    Colon cancer ([place]) 04/22/2013   Hypertension    Lung cancer ([place]) 08/26/2021   Renal disorder    Kidney stones     Allergies  Allergen Reactions   Paclitaxel Shortness Of Breath    Hypersensitivity reaction. See progress note dated 09/27/2021.   Penicillins Other (See Comments)    Childhood Allergy  Has patient had a PCN reaction causing immediate rash, facial/tongue/throat swelling, SOB or lightheadedness with hypotension: No Has patient had a PCN reaction causing severe rash involving mucus membranes or skin necrosis: No Has patient had a PCN reaction that required hospitalization: No Has patient had a PCN reaction occurring within the last 10 years: No If all of the above answers  are "NO", then may proceed with Cephalosporin use.      Current Outpatient Medications  Medication Sig Dispense Refill   acetaminophen (TYLENOL) 325 MG tablet Take 2 tablets (650 mg total) by mouth every 6 (six) hours as needed for mild pain (or Fever >/= 101). 12 tablet 0   apixaban (ELIQUIS) 5 MG TABS tablet Take 1 tablet (5 mg total) by mouth 2 (two) times daily. 60 tablet 2   atorvastatin (LIPITOR) 20 MG tablet Take 1 tablet (20 mg total) by mouth every evening. 30 tablet 3   bisoprolol (ZEBETA) 5 MG tablet Take 1.5 tablets (7.5 mg total) by mouth daily. 135 tablet 3   Budeson-Glycopyrrol-Formoterol (BREZTRI AEROSPHERE) 160-9-4.8 MCG/ACT AERO Inhale 2 puffs into the lungs in the morning and at bedtime. 10.7 g 0   glimepiride (AMARYL) 2 MG tablet Take 1 tablet (2 mg total) by mouth every morning. 30 tablet 11   Multiple Vitamin (MULTI-VITAMIN DAILY PO) Take 1 tablet by mouth daily.     potassium chloride (KLOR-CON) 10 MEQ tablet Take 1 tablet (10 mEq total) by mouth daily. Take While taking Lasix/furosemide 30 tablet 2   prochlorperazine (COMPAZINE) 10 MG tablet Take 1 tablet (10 mg total) by mouth every 6 (six) hours as needed for nausea or vomiting. 30 tablet 0   torsemide 40 MG TABS Take 40 mg  by mouth every morning. 30 tablet 2   No current facility-administered medications for this visit.     Past Surgical History:  Procedure Laterality Date   BRONCHIAL BIOPSY  08/26/2021   Procedure: BRONCHIAL BIOPSIES;  Surgeon: Garner Nash, DO;  Location: [facility];  Service: Pulmonary;;   BRONCHIAL BRUSHINGS  08/26/2021   Procedure: BRONCHIAL BRUSHINGS;  Surgeon: Garner Nash, DO;  Location: [facility];  Service: Pulmonary;;   BRONCHIAL NEEDLE ASPIRATION BIOPSY  08/26/2021   Procedure: BRONCHIAL NEEDLE ASPIRATION BIOPSIES;  Surgeon: Garner Nash, DO;  Location: [facility];  Service: Pulmonary;;   CARDIOVERSION N/A 06/03/2021   Procedure: CARDIOVERSION;  Surgeon: Satira Sark, MD;  Location: [facility];  Service: Cardiovascular;  Laterality: N/A;   COLONOSCOPY N/A 04/29/2013   Procedure: COLONOSCOPY;  Surgeon: Danie Binder, MD;  [facility];  Service: Endoscopy;  Laterality: N/A;  10:30AM   Cyst removed from left elbow     ENDOVENOUS ABLATION SAPHENOUS VEIN W/ LASER Left 01/16/2019   endovenous laser ablation left greater saphenous vein by Ruta Hinds MD   KIDNEY STONE SURGERY     LITHOTRIPSY     PARTIAL COLECTOMY     TEE WITHOUT CARDIOVERSION N/A 06/03/2021   Procedure: TRANSESOPHAGEAL ECHOCARDIOGRAM (TEE);  Surgeon: Satira Sark, MD;  Location: [facility];  Service: Cardiovascular;  Laterality: N/A;   VIDEO BRONCHOSCOPY WITH ENDOBRONCHIAL ULTRASOUND Bilateral 08/26/2021   Procedure: VIDEO BRONCHOSCOPY WITH ENDOBRONCHIAL ULTRASOUND;  Surgeon: Garner Nash, DO;  Location: [facility];  Service: Pulmonary;  Laterality: Bilateral;  will need Guardant [id number]     Allergies  Allergen Reactions   Paclitaxel Shortness Of Breath    Hypersensitivity reaction. See progress note dated 09/27/2021.   Penicillins Other (See Comments)    Childhood Allergy  Has patient had a PCN reaction causing immediate rash, facial/tongue/throat swelling, SOB or lightheadedness with hypotension: No Has patient had a PCN reaction causing severe rash involving mucus membranes or skin necrosis: No Has patient had a PCN reaction that required hospitalization: No Has patient had a PCN reaction occurring within the last 10 years: No If all of the above answers are "NO", then may proceed with Cephalosporin use.       Family History  Problem Relation Age of Onset   Cancer Mother        pancreatic   Colon cancer Neg Hx      Social History Mr. Casteneda reports that he has been smoking cigarettes. He has a 10.00 pack-year smoking history. He has never used smokeless tobacco. Mr. Gorka reports current alcohol use of about 12.0 standard drinks of alcohol per  week.   Review of Systems CONSTITUTIONAL: No weight loss, fever, chills, weakness or fatigue.  HEENT: Eyes: No visual loss, blurred vision, double vision or yellow sclerae.No hearing loss, sneezing, congestion, runny nose or sore throat.  SKIN: No rash or itching.  CARDIOVASCULAR: per hpi RESPIRATORY: No shortness of breath, cough or sputum.  GASTROINTESTINAL: No anorexia, nausea, vomiting or diarrhea. No abdominal pain or blood.  GENITOURINARY: No burning on urination, no polyuria NEUROLOGICAL: No headache, dizziness, syncope, paralysis, ataxia, numbness or tingling in the extremities. No change in bowel or bladder control.  MUSCULOSKELETAL: No muscle, back pain, joint pain or stiffness.  LYMPHATICS: No enlarged nodes. No history of splenectomy.  PSYCHIATRIC: No history of depression or anxiety.  ENDOCRINOLOGIC: No reports of sweating, cold or heat intolerance. No polyuria or polydipsia.  Marland Kitchen   Physical Examination Today's  Vitals   02/24/22 1029  BP: 108/60  Pulse: 77  SpO2: (!) 89%  Weight: 272 lb 3.2 oz (123.5 kg)  Height: 6' (1.829 m)   Body mass index is 36.92 kg/m.  Gen: resting comfortably, no acute distress HEENT: no scleral icterus, pupils equal round and reactive, no palptable cervical adenopathy,  CV: RRR, no m/r/g, no jvd Resp: Clear to auscultation bilaterally GI: abdomen is soft, non-tender, non-distended, normal bowel sounds, no hepatosplenomegaly MSK: extremities are warm, no edema.  Skin: warm, no rash Neuro:  no focal deficits Psych: appropriate affect   Diagnostic Studies  Jan 2023 echo    1. Left ventricular ejection fraction, by estimation, is 60 to 65%. The  left ventricle has normal function. The left ventricle has no regional  wall motion abnormalities. There is mild left ventricular hypertrophy.  Left ventricular diastolic parameters  are indeterminate. There is the interventricular septum is flattened in  systole and diastole, consistent with  right ventricular pressure and  volume overload.   2. Right ventricular systolic function is mildly reduced. The right  ventricular size is mildly enlarged. There is moderately elevated  pulmonary artery systolic pressure. The estimated right ventricular  systolic pressure is 29.5 mmHg.   3. Left atrial size was mildly dilated.   4. Right atrial size was mildly dilated.   5. The mitral valve is grossly normal. Trivial mitral valve  regurgitation.   6. Tricuspid valve regurgitation is moderate.   7. The aortic valve is tricuspid. There is moderate calcification of the  aortic valve. Aortic valve regurgitation is not visualized. Aortic valve  sclerosis/calcification is present, without any evidence of aortic  stenosis. Aortic valve mean gradient  measures 6.0 mmHg.   8. Aortic dilatation noted. There is mild dilatation of the aortic root,  measuring 41 mm.   9. The inferior vena cava is dilated in size with <50% respiratory  variability, suggesting right atrial pressure of 15 mmHg.   12/2021 monitor 7 day monitor   Rare supraventricular ectopy in the form of isolated PACs, couplets, triplets. Eight runs of SVT longest 14 beats   Occasional ventricular ectopy in the form of isolated PVCs (2.2%), rare couplets   Reported symptoms correlated with sinus rhythm with PVCs     Patch Wear Time:  6 days and 23 hours (2023-08-02T16:06:53-0400 to 2023-08-09T15:58:23-0400)   Patient had a min HR of 48 bpm, max HR of 193 bpm, and avg HR of 77 bpm. Predominant underlying rhythm was Sinus Rhythm. 8 Supraventricular Tachycardia runs occurred, the run with the fastest interval lasting 5 beats with a max rate of 193 bpm, the  longest lasting 14 beats with an avg rate of 104 bpm. Isolated SVEs were rare (<1.0%), SVE Couplets were rare (<1.0%), and SVE Triplets were rare (<1.0%). Isolated VEs were occasional (2.2%, 15785), VE Couplets were rare (<1.0%, 21), and no VE Triplets  were present. Ventricular  Bigeminy and Trigeminy were present.    Assessment and Plan  1.Chronic HFpEF -no recent symptoms, home weights table last several weeks. He has chronic LLE edema - continue current meds   2. Aflutter - s/p TEE/DCCV - recent monitor for palpitations showed ectopy,short runs SVT. No recurrent aflutter. SYmptoms improved with increased bisoprolol      3. HTN - he is at goal, continue current meds  F/u 6 months      Arnoldo Lenis, M.D.

## 2022-02-25 ENCOUNTER — Other Ambulatory Visit: Payer: Self-pay

## 2022-02-25 DIAGNOSIS — E6609 Other obesity due to excess calories: Secondary | ICD-10-CM | POA: Diagnosis not present

## 2022-02-25 DIAGNOSIS — R0902 Hypoxemia: Secondary | ICD-10-CM | POA: Diagnosis not present

## 2022-02-25 DIAGNOSIS — J449 Chronic obstructive pulmonary disease, unspecified: Secondary | ICD-10-CM | POA: Diagnosis not present

## 2022-02-25 DIAGNOSIS — Z6836 Body mass index (BMI) 36.0-36.9, adult: Secondary | ICD-10-CM | POA: Diagnosis not present

## 2022-02-25 DIAGNOSIS — I4892 Unspecified atrial flutter: Secondary | ICD-10-CM | POA: Diagnosis not present

## 2022-02-25 DIAGNOSIS — E782 Mixed hyperlipidemia: Secondary | ICD-10-CM | POA: Diagnosis not present

## 2022-02-25 DIAGNOSIS — Z1331 Encounter for screening for depression: Secondary | ICD-10-CM | POA: Diagnosis not present

## 2022-02-25 DIAGNOSIS — E119 Type 2 diabetes mellitus without complications: Secondary | ICD-10-CM | POA: Diagnosis not present

## 2022-02-25 DIAGNOSIS — R69 Illness, unspecified: Secondary | ICD-10-CM | POA: Diagnosis not present

## 2022-02-25 DIAGNOSIS — I10 Essential (primary) hypertension: Secondary | ICD-10-CM | POA: Diagnosis not present

## 2022-02-25 DIAGNOSIS — Z0001 Encounter for general adult medical examination with abnormal findings: Secondary | ICD-10-CM | POA: Diagnosis not present

## 2022-02-25 DIAGNOSIS — I509 Heart failure, unspecified: Secondary | ICD-10-CM | POA: Diagnosis not present

## 2022-03-03 ENCOUNTER — Ambulatory Visit: Payer: Medicare HMO | Admitting: Internal Medicine

## 2022-03-03 ENCOUNTER — Encounter: Payer: Self-pay | Admitting: Internal Medicine

## 2022-03-03 DIAGNOSIS — J9611 Chronic respiratory failure with hypoxia: Secondary | ICD-10-CM | POA: Diagnosis not present

## 2022-03-03 DIAGNOSIS — R0609 Other forms of dyspnea: Secondary | ICD-10-CM | POA: Diagnosis not present

## 2022-03-03 DIAGNOSIS — J449 Chronic obstructive pulmonary disease, unspecified: Secondary | ICD-10-CM | POA: Diagnosis not present

## 2022-03-03 DIAGNOSIS — J9612 Chronic respiratory failure with hypercapnia: Secondary | ICD-10-CM

## 2022-03-03 MED ORDER — PREDNISONE 10 MG PO TABS: ORAL_TABLET | ORAL | 0 refills | Status: DC

## 2022-03-03 NOTE — Patient Instructions (Addendum)
Prednisone 10 mg take  4 each am x 2 days,   2 each am x 2 days,  1 each am x 2 days and stop   Work on inhaler technique:  relax and gently blow all the way out then take a nice smooth full deep breath back in, triggering the inhaler at same time you start breathing in.  Hold breath in for at least  5 seconds if you can. Blow out breztri thru nose. Rinse and gargle with water when done.  If mouth or throat bother you at all,  try brushing teeth/gums/tongue with arm and hammer toothpaste/ make a slurry and gargle and spit out.  - use empty devise to practice deeper breaths in but remember to relax and blow all the way out first      Please remember to go to the lab department  for your tests - we will call you with the results when they are available.  For now will need to need to hold the imfinzi - I will notify Dr Earlie Server       Please schedule a follow up office visit in 6 weeks, call sooner if needed

## 2022-03-03 NOTE — Progress Notes (Unsigned)
[name], male    DOB: 04/08/1956,     MRN: [id number]   Brief patient profile:  22  yowm  active smoker /MM  referred to pulmonary clinic in [place]  07/08/2021 by Domenic Polite  for copd eval with onset 2018       History of Present Illness  07/08/2021  Pulmonary/ 1st office eval/ [name] / [place] Office  Chief Complaint  Patient presents with   Consult    Self referral for what patient thought was COPD and it was Afib.  Patient using oxygen at home prn.   Dyspnea:  walking around the yard x 5 min / crosses parking lots/ [employer] pushing cart not using 02 much with exertion Cough: minimal /no am flare or purulent sputum  Sleep: bed flat/ one pillow on 2lpm  SABA use:  has symbicort breztri albuterol listed and not clear how he really uses them 02  2lpm hs and prn  Rec Plan A = Automatic = Always=    Breztri Take 2 puffs first thing in am and then another 2 puffs about 12 hours later.   Work on inhaler technique:  Remember to warm up like a golfer Plan B = Backup (to supplement plan A, not to replace it) Only use your albuterol inhaler as a rescue medication  Plan C = Crisis (instead of Plan B but only if Plan B stops working) - only use your albuterol nebulizer if you first try Plan B and it fails to help > ok to use the nebulizer up to every 4 hours but if start needing it regularly call for immediate appointment Make sure you check your oxygen saturation  AT  your highest level of activity (not after you stop)   to be sure it stays over 90% Cxr 07/08/21 ? Lung mass  >>> CT 07/23/21 Rounded masslike area in the left upper lobe measuring up to 5.6 cm. Appearance is concerning for lung cancer. Associated left hilar adenopathy. Borderline sized mediastinal lymph nodes>>> PET 08/12/21  c/w Stage 3b lung ca > referred for fob/ebus (Dr Windell Norfolk)  Sq cell ca  Rx chemo and RT completed June 9th 2023   08/24/2021  f/u ov/[place] office/[name] re: GOLD 3 / restrictive component from  obesity  maint on breztri   Chief Complaint  Patient presents with   Follow-up    Patient here to go over CT and PET scan. Patient also had PFT done. Is scheduled for Bronch Thursday. Increased Productive cough with clear sputum and feels like breathing is better.  Dyspnea:  MMRC1 = can walk nl pace, flat grade, can't hurry or go uphills or steps s sob   Cough: cough after hs  Sleeping: bed is flat/ on side / 2 pillows  SABA use: a lot less now  02:  2lpm hs  and when walks > 90%  Covid status: vax 3  Rec Work on inhaler technique:   Keep walking and tracking your 02 saturation at peak exercise NOT after you stop with the goal is keep it over 90%  The key is to stop smoking completely before smoking completely stops you!      11/22/2021  f/u ov/[place] office/[name] re: GOLD 3/ 02 dep  maint on breztri (but out x sev days) and still smoking   Chief Complaint  Patient presents with   Follow-up    Ct done on 6/29. Coughing has improved.   Dyspnea:  still MMRC 1  Cough: better  Sleeping:  on side L / flat bed / 2 pillows  SABA use: neither puff nor neb 02: prn rarely uses  Rec Plan A = Automatic = Always=    Breztri (or Symbicort ) 2 puffs first thing in am and the pm doses are optional  Plan B = Backup (to supplement plan A, not to replace it) Only use your albuterol inhaler as a rescue medication  Plan C = Crisis (instead of Plan B but only if Plan B stops working) - only use your albuterol nebulizer if you first try Plan B  Make sure you check your oxygen saturation  AT  your highest level of activity (not after you stop)   to be sure it stays over 90% a Please schedule a follow up visit in 6  months but call sooner if needed    03/03/2022  f/u ov/[place] office/[name] re: GOLD 3/ prn 02 maint on breztri  / still smoking Chief Complaint  Patient presents with   Follow-up    Breathing has been worse for the last month or so   Dyspnea:  worse w/in  few weeks of imfinzy with  CT 02/21/22 showing more RT changes and small effusion on L  Cough: clear mucus assoc with smoker's rattle   esp in am Sleeping: flat bed on side 2 pillows  SABA use: occ albuterol  02: 3lpm hs and 3lpm with activity but RA ok at rest  Covid status: vax x 3      No obvious day to day or daytime variability or assoc excess/ purulent sputum or mucus plugs or hemoptysis or cp or chest tightness, subjective wheeze or overt sinus or hb symptoms.   Sleeping  without nocturnal  or early am exacerbation  of respiratory  c/o's or need for noct saba. Also denies any obvious fluctuation of symptoms with weather or environmental changes or other aggravating or alleviating factors except as outlined above   No unusual exposure hx or h/o childhood pna/ asthma or knowledge of premature birth.  Current Allergies, Complete Past Medical History, Past Surgical History, Family History, and Social History were reviewed in [employer] record.  ROS  The following are not active complaints unless bolded Hoarseness, sore throat, dysphagia, dental problems, itching, sneezing,  nasal congestion or discharge of excess mucus or purulent secretions, ear ache,   fever, chills, sweats, unintended wt loss or wt gain, classically pleuritic or exertional cp,  orthopnea pnd or arm/hand swelling  or leg swelling, presyncope, palpitations, abdominal pain, anorexia, nausea, vomiting, diarrhea  or change in bowel habits or change in bladder habits, change in stools or change in urine, dysuria, hematuria,  rash, arthralgias, visual complaints, headache, numbness, weakness or ataxia or problems with walking or coordination,  change in mood or  memory.        Current Meds  Medication Sig   acetaminophen (TYLENOL) 325 MG tablet Take 2 tablets (650 mg total) by mouth every 6 (six) hours as needed for mild pain (or Fever >/= 101).   apixaban (ELIQUIS) 5 MG TABS tablet Take 1 tablet (5 mg total) by mouth 2 (two)  times daily.   atorvastatin (LIPITOR) 20 MG tablet Take 1 tablet (20 mg total) by mouth every evening.   bisoprolol (ZEBETA) 5 MG tablet Take 1.5 tablets (7.5 mg total) by mouth daily.   Budeson-Glycopyrrol-Formoterol (BREZTRI AEROSPHERE) 160-9-4.8 MCG/ACT AERO Inhale 2 puffs into the lungs in the morning and at bedtime.   glimepiride (AMARYL) 2 MG tablet  Take 1 tablet (2 mg total) by mouth every morning.   Multiple Vitamin (MULTI-VITAMIN DAILY PO) Take 1 tablet by mouth daily.   potassium chloride (KLOR-CON) 10 MEQ tablet Take 1 tablet (10 mEq total) by mouth daily. Take While taking Lasix/furosemide   prochlorperazine (COMPAZINE) 10 MG tablet Take 1 tablet (10 mg total) by mouth every 6 (six) hours as needed for nausea or vomiting.   torsemide 40 MG TABS Take 40 mg by mouth every morning.              Objective:    Wts  03/03/2022     279   11/22/2021        266   08/24/21 264 lb 12.8 oz (120.1 kg)  07/08/21 264 lb (119.7 kg)  06/05/21 259 lb 0.7 oz (117.5 kg)     Vital signs reviewed  03/03/2022  - Note at rest 02 sats  ? % on 3lpm cont  (oximetry not matching pulse)   General appearance:    chronically ill edlerly obese wm nad   HEENT : Oropharynx  clear   Nasal turbinates nl    NECK :  without  apparent JVD/ palpable Nodes/TM    LUNGS: no acc muscle use,  Mild barrel  contour chest wall with bilateral  Distant bs s audible wheeze and  without cough on insp or exp maneuvers  and decreased bs/ dullness L base    CV:  RRR  no s3 or murmur or increase in P2, and no edema   ABD:  quite obese soft and nontender with pos end  insp Hoover's  in the supine position.  No bruits or organomegaly appreciated   MS:  Nl gait/ ext warm without deformities Or obvious joint restrictions  calf tenderness, cyanosis or clubbing     SKIN: warm and dry without lesions    NEURO:  alert, approp, nl sensorium with  no motor or cerebellar deficits apparent.       I personally reviewed  images and agree with radiology impression as follows:   Chest CT w contrast 02/21/22  1. Interval decrease in size of lingular mass. 2. New radiation change in the lingula and new pleural fluid 3. New small LEFT lower lobe pulmonary nodule may be inflammatory. Recommend attention on routine follow-up. 4. Reduction in size of previously hypermetabolic LEFT mediastinal lymph node       Lab Results  Component Value Date   ESRSEDRATE 4 03/03/2022   ESRSEDRATE 18 07/09/2021      Lab Results  Component Value Date   HGB 12.5 (L) 02/23/2022   HGB 12.8 (L) 01/26/2022   HGB 12.4 (L) 12/29/2021   HGB 14.3 07/09/2021     BNP  03/03/2022     =  230.7   Labs ordered/ reviewed:      Chemistry      Component Value Date/Time   NA 142 02/23/2022 0922   K 4.7 02/23/2022 0922   CL 97 (L) 02/23/2022 0922   CO2 39 (H) 02/23/2022 0922   BUN 17 02/23/2022 0922   CREATININE 1.04 02/23/2022 0922      Component Value Date/Time   CALCIUM 9.2 02/23/2022 0922   ALKPHOS 98 02/23/2022 0922   AST 20 02/23/2022 0922   ALT 20 02/23/2022 0922   BILITOT 0.9 02/23/2022 0922        Lab Results  Component Value Date   WBC 6.1 02/23/2022   HGB 12.5 (L) 02/23/2022   HCT 38.9 (  L) 02/23/2022   MCV 109.3 (H) 02/23/2022   PLT 148 (L) 02/23/2022        Lab Results  Component Value Date   TSH 1.698 02/23/2022          Assessment

## 2022-03-04 LAB — BRAIN NATRIURETIC PEPTIDE: BNP: 230.7 pg/mL — ABNORMAL HIGH (ref 0.0–100.0)

## 2022-03-04 LAB — SEDIMENTATION RATE: Sed Rate: 4 mm/hr (ref 0–30)

## 2022-03-04 NOTE — Assessment & Plan Note (Signed)
Active smoker/MM  - 07/08/2021  After extensive coaching inhaler device,  effectiveness =    75% try breztri samples and f/u with pfts - 07/08/21  Alpha one AT  MM/ level  220  - 07/08/21  Eos 0.1  - PFT's  07/22/2021   FEV1 1.80 (49 % ) ratio 0.70  p 3 % improvement from saba p brezti x one puff  prior to study with DLCO  28.26 (39%)   FV curve mild concavity    - 11/22/2021   Walked on RA  x  3   lap(s) =  approx 300  ft  @ mod pace, stopped due to sob with lowest 02 sats 91% 03/03/2022  After extensive coaching inhaler device,  effectiveness =    75% (short Ti)   Group D (now reclassified as E) in terms of symptom/risk and laba/lama/ICS  therefore appropriate rx at this point >>>  Continue breztri and work on optimal hfa   Prednisone 10 mg take  4 each am x 2 days,   2 each am x 2 days,  1 each am x 2 days and stop to see if there is any reversible inflammatory component (eg RT pneumonitis, doubt with ESR so low)

## 2022-03-05 ENCOUNTER — Encounter: Payer: Self-pay | Admitting: Internal Medicine

## 2022-03-05 NOTE — Assessment & Plan Note (Signed)
No evidence of chf or anemia or thyroid dz or systemic inflammatory component or asthma/ allergy at this point so main concern is adverse drug effect so best to hold off further chemo/RT until this is sorted out.  PE is a always a concern and only way to exclude is another CTa (ddimer most likely elevated here) but my clinical suspicion is low esp with hypercarbic component which would be very unusual.  Will ask pt to monitor sats with exertion on and after short course of prednisone and f/u closely with oncology.   F/u 6 weeks, sooner if needed and to ER if getting  A l ot worse or can't get sats up at rest         Each maintenance medication was reviewed in detail including emphasizing most importantly the difference between maintenance and prns and under what circumstances the prns are to be triggered using an action plan format where appropriate.  Total time for H and P, chart review, counseling, reviewing hfa/02 device(s) and generating customized AVS unique to this office visit / same day charting > 30 min for  chronic worsening  respiratory  symptoms of uncertain etiology

## 2022-03-05 NOTE — Assessment & Plan Note (Addendum)
HC03   06/05/21   =   38  - 07/08/2021  patient walked at a moderate pace on room air x 300 ft. At end  desat to 88%. Placed on 2LO2 cont. patient sats increased to 93% for another 150 ft  - 11/22/2021 no desats walking 300 ft stopped due to sob  - 03/03/2022 sats ? 78% on 3 lpm cont  With h/o sats worse  few weeks of imfinzy with CT - 02/21/22 showing more RT changes and small effusion on L >> rec trial off imfinzy and rx prednisone with close f/u sats at rest and with activity > admit if worsening  - HC03  02/23/22  = 39   Clearly worse since starting Imfinzi but not necessarily due to it with ESR relatively low but I don't have another good explanation at this point and would rec he skep the next infusion to establish a a better baseline or consider alternative   In meantime rec Make sure you check your oxygen saturation  AT  your highest level of activity (not after you stop)   to be sure it stays over 90% and adjust  02 flow upward to maintain this level if needed but remember to turn it back to previous settings when you stop (to conserve your supply).

## 2022-03-09 ENCOUNTER — Telehealth: Payer: Self-pay | Admitting: Internal Medicine

## 2022-03-09 MED ORDER — BREZTRI AEROSPHERE 160-9-4.8 MCG/ACT IN AERO: 2.0000 | INHALATION_SPRAY | Freq: Two times a day (BID) | RESPIRATORY_TRACT | 11 refills | Status: AC

## 2022-03-09 NOTE — Telephone Encounter (Signed)
New Breztri Rx sent to [facility]

## 2022-03-10 DIAGNOSIS — J9601 Acute respiratory failure with hypoxia: Secondary | ICD-10-CM | POA: Diagnosis not present

## 2022-03-10 DIAGNOSIS — I5022 Chronic systolic (congestive) heart failure: Secondary | ICD-10-CM | POA: Diagnosis not present

## 2022-03-14 ENCOUNTER — Telehealth: Payer: Self-pay | Admitting: Internal Medicine

## 2022-03-14 MED ORDER — PREDNISONE 10 MG PO TABS: ORAL_TABLET | ORAL | 0 refills | Status: DC

## 2022-03-14 NOTE — Telephone Encounter (Signed)
Refill sent to [facility]. Patients wife Terri notified.

## 2022-03-14 NOTE — Telephone Encounter (Signed)
Called and spoke to patient. He states that at last ov he was prescribed prednisone and states it really helped with his breathing and he has noticed a difference since finishing the taper (SOB is worse since being off of prednisone) Patient wants to know if we can refill the taper or send in a daily rx of prednisone for him. Denies any other symptoms besides SOB   Dr. [name] please advise.

## 2022-03-14 NOTE — Telephone Encounter (Signed)
Ok

## 2022-03-21 NOTE — Progress Notes (Deleted)
[facility] OFFICE PROGRESS NOTE  [name] Samples, PA-C [place] [address]  DIAGNOSIS: Stage IIIb (T3, N2, M0) non-small cell lung cancer, squamous cell carcinoma presented with large left upper lobe lung mass in addition to mediastinal lymphadenopathy diagnosed in April 2023. The patient has a history of colon cancer treated at [facility] in 2014 status post surgical resection followed by adjuvant systemic chemotherapy.  PRIOR THERAPY: Concurrent chemoradiation with weekly carboplatin for AUC of 2 and paclitaxel 45 Mg/M2.  Status post 7 cycle.  Last cycle was given on 10/26/2021 with partial response.  CURRENT THERAPY: Consolidation treatment with immunotherapy with Imfinzi 1500 Mg IV every 4 weeks.  First dose December 01, 2021.  Status post 5 cycles.   INTERVAL HISTORY: Nelson Noone Longoria 66 y.o. male returns to clinic today for follow-up visit accompanied by ***.  The patient is feeling fairly well today without any concerning complaints except for ***.  Being seen, the patient establish care with Dr. Dema Severin from pulmonary medicine.  The patient is currently on 3 L of supplemental oxygen via nasal cannula.  Regarding his treatment, the patient is tolerating immunotherapy with Imfinzi fairly well without any concerning adverse side effects.  The patient denies any fever, chills, or night sweats.  Weight?  He reports his baseline dyspnea on exertion cough?  He denies any chest pain or hemoptysis.  Denies any nausea, vomiting, diarrhea, or constipation.  Denies any headache or visual changes.  He denies any rashes or skin changes.  He is here today for evaluation repeat blood work before undergoing cycle #5.     MEDICAL HISTORY: Past Medical History:  Diagnosis Date   Atrial flutter ([place])    s/p DCCV 06/03/21   CHF (congestive heart failure) ([place])    Colon cancer ([place]) 04/22/2013   Hypertension    Lung cancer ([place]) 08/26/2021   Renal disorder     Kidney stones    ALLERGIES:  is allergic to paclitaxel and penicillins.  MEDICATIONS:  Current Outpatient Medications  Medication Sig Dispense Refill   acetaminophen (TYLENOL) 325 MG tablet Take 2 tablets (650 mg total) by mouth every 6 (six) hours as needed for mild pain (or Fever >/= 101). 12 tablet 0   apixaban (ELIQUIS) 5 MG TABS tablet Take 1 tablet (5 mg total) by mouth 2 (two) times daily. 60 tablet 2   atorvastatin (LIPITOR) 20 MG tablet Take 1 tablet (20 mg total) by mouth every evening. 30 tablet 3   bisoprolol (ZEBETA) 5 MG tablet Take 1.5 tablets (7.5 mg total) by mouth daily. 135 tablet 3   Budeson-Glycopyrrol-Formoterol (BREZTRI AEROSPHERE) 160-9-4.8 MCG/ACT AERO Inhale 2 puffs into the lungs in the morning and at bedtime. 10.7 g 11   glimepiride (AMARYL) 2 MG tablet Take 1 tablet (2 mg total) by mouth every morning. 30 tablet 11   Multiple Vitamin (MULTI-VITAMIN DAILY PO) Take 1 tablet by mouth daily.     potassium chloride (KLOR-CON) 10 MEQ tablet Take 1 tablet (10 mEq total) by mouth daily. Take While taking Lasix/furosemide 30 tablet 2   predniSONE (DELTASONE) 10 MG tablet Take  4 each am x 2 days,   2 each am x 2 days,  1 each am x 2 days and stop 14 tablet 0   prochlorperazine (COMPAZINE) 10 MG tablet Take 1 tablet (10 mg total) by mouth every 6 (six) hours as needed for nausea or vomiting. 30 tablet 0   torsemide 40 MG TABS  Take 40 mg by mouth every morning. 30 tablet 2   No current facility-administered medications for this visit.    SURGICAL HISTORY:  Past Surgical History:  Procedure Laterality Date   BRONCHIAL BIOPSY  08/26/2021   Procedure: BRONCHIAL BIOPSIES;  Surgeon: Garner Nash, DO;  Location: [facility];  Service: Pulmonary;;   BRONCHIAL BRUSHINGS  08/26/2021   Procedure: BRONCHIAL BRUSHINGS;  Surgeon: Garner Nash, DO;  Location: [facility];  Service: Pulmonary;;   BRONCHIAL NEEDLE ASPIRATION BIOPSY  08/26/2021   Procedure: BRONCHIAL NEEDLE  ASPIRATION BIOPSIES;  Surgeon: Garner Nash, DO;  Location: [facility];  Service: Pulmonary;;   CARDIOVERSION N/A 06/03/2021   Procedure: CARDIOVERSION;  Surgeon: Satira Sark, MD;  Location: [facility];  Service: Cardiovascular;  Laterality: N/A;   COLONOSCOPY N/A 04/29/2013   Procedure: COLONOSCOPY;  Surgeon: Danie Binder, MD;  [facility];  Service: Endoscopy;  Laterality: N/A;  10:30AM   Cyst removed from left elbow     ENDOVENOUS ABLATION SAPHENOUS VEIN W/ LASER Left 01/16/2019   endovenous laser ablation left greater saphenous vein by Ruta Hinds MD   KIDNEY STONE SURGERY     LITHOTRIPSY     PARTIAL COLECTOMY     TEE WITHOUT CARDIOVERSION N/A 06/03/2021   Procedure: TRANSESOPHAGEAL ECHOCARDIOGRAM (TEE);  Surgeon: Satira Sark, MD;  Location: [facility];  Service: Cardiovascular;  Laterality: N/A;   VIDEO BRONCHOSCOPY WITH ENDOBRONCHIAL ULTRASOUND Bilateral 08/26/2021   Procedure: VIDEO BRONCHOSCOPY WITH ENDOBRONCHIAL ULTRASOUND;  Surgeon: Garner Nash, DO;  Location: [facility];  Service: Pulmonary;  Laterality: Bilateral;  will need Guardant [id number]    REVIEW OF SYSTEMS:   Review of Systems  Constitutional: Negative for appetite change, chills, fatigue, fever and unexpected weight change.  HENT:   Negative for mouth sores, nosebleeds, sore throat and trouble swallowing.   Eyes: Negative for eye problems and icterus.  Respiratory: Negative for cough, hemoptysis, shortness of breath and wheezing.   Cardiovascular: Negative for chest pain and leg swelling.  Gastrointestinal: Negative for abdominal pain, constipation, diarrhea, nausea and vomiting.  Genitourinary: Negative for bladder incontinence, difficulty urinating, dysuria, frequency and hematuria.   Musculoskeletal: Negative for back pain, gait problem, neck pain and neck stiffness.  Skin: Negative for itching and rash.  Neurological: Negative for dizziness, extremity weakness, gait problem, headaches,  light-headedness and seizures.  Hematological: Negative for adenopathy. Does not bruise/bleed easily.  Psychiatric/Behavioral: Negative for confusion, depression and sleep disturbance. The patient is not nervous/anxious.     PHYSICAL EXAMINATION:  There were no vitals taken for this visit.  ECOG PERFORMANCE STATUS: {CHL ONC ECOG [id number]  Physical Exam  Constitutional: Oriented to person, place, and time and well-developed, well-nourished, and in no distress. No distress.  HENT:  Head: Normocephalic and atraumatic.  Mouth/Throat: Oropharynx is clear and moist. No oropharyngeal exudate.  Eyes: Conjunctivae are normal. Right eye exhibits no discharge. Left eye exhibits no discharge. No scleral icterus.  Neck: Normal range of motion. Neck supple.  Cardiovascular: Normal rate, regular rhythm, normal heart sounds and intact distal pulses.   Pulmonary/Chest: Effort normal and breath sounds normal. No respiratory distress. No wheezes. No rales.  Abdominal: Soft. Bowel sounds are normal. Exhibits no distension and no mass. There is no tenderness.  Musculoskeletal: Normal range of motion. Exhibits no edema.  Lymphadenopathy:    No cervical adenopathy.  Neurological: Alert and oriented to person, place, and time. Exhibits normal muscle tone. Gait normal. Coordination normal.  Skin: Skin is warm  and dry. No rash noted. Not diaphoretic. No erythema. No pallor.  Psychiatric: Mood, memory and judgment normal.  Vitals reviewed.  LABORATORY DATA: Lab Results  Component Value Date   WBC 6.1 02/23/2022   HGB 12.5 (L) 02/23/2022   HCT 38.9 (L) 02/23/2022   MCV 109.3 (H) 02/23/2022   PLT 148 (L) 02/23/2022      Chemistry      Component Value Date/Time   NA 142 02/23/2022 0922   K 4.7 02/23/2022 0922   CL 97 (L) 02/23/2022 0922   CO2 39 (H) 02/23/2022 0922   BUN 17 02/23/2022 0922   CREATININE 1.04 02/23/2022 0922      Component Value Date/Time   CALCIUM 9.2 02/23/2022 0922    ALKPHOS 98 02/23/2022 0922   AST 20 02/23/2022 0922   ALT 20 02/23/2022 0922   BILITOT 0.9 02/23/2022 0922       RADIOGRAPHIC STUDIES:  CT Chest W Contrast  Result Date: 02/23/2022 CLINICAL DATA:  Non-small cell lung cancer. Restaging. * Tracking Code: BO * EXAM: CT CHEST WITH CONTRAST TECHNIQUE: Multidetector CT imaging of the chest was performed during intravenous contrast administration. RADIATION DOSE REDUCTION: This exam was performed according to the departmental dose-optimization program which includes automated exposure control, adjustment of the mA and/or kV according to patient size and/or use of iterative reconstruction technique. CONTRAST:  64mL OMNIPAQUE IOHEXOL 300 MG/ML  SOLN COMPARISON:  CT 11/18/2021 FINDINGS: Cardiovascular: Mediastinum/Nodes: Lymph node adjacent to the LEFT main pulmonary artery measures 6 mm (image 61/2) decreased from 17 mm. This node was intensely hypermetabolic on comparison PET-CT scan. No measurable hilar adenopathy on the LEFT. No contralateral adenopathy. NO supraclavicular adenopathy. Lungs/Pleura: Lingular mass is decreased in size measuring 3.1 x 1.5 cm compared to 3.3 x 1.8 cm most recent CT comparison. Cavitary portion of the mass has collapsed. There is interstitial thickening in the lingula surrounding the mass presumably related to radiation change. There is a new moderate size LEFT pleural effusion. Small LEFT lobe pulmonary nodule measuring 5 mm (57/)7 not clearly seen on prior. No RIGHT lung lesions. Upper Abdomen: Limited view of the liver, kidneys, pancreas are unremarkable. Normal adrenal glands. Musculoskeletal: No aggressive osseous lesion. IMPRESSION: 1. Interval decrease in size of lingular mass. 2. New radiation change in the lingula and new pleural fluid 3. New small LEFT lower lobe pulmonary nodule may be inflammatory. Recommend attention on routine follow-up. 4. Reduction in size of previously hypermetabolic LEFT mediastinal lymph node .  Electronically Signed   By: Suzy Bouchard M.D.   On: 02/23/2022 08:50     ASSESSMENT/PLAN:  This is a very pleasant 66 year old Caucasian male with stage IIIb (T3, N2, M0) non-small cell lung cancer, squamous cell carcinoma.  He presented with a left upper lobe lung mass in addition to mediastinal adenopathy.  He was diagnosed in April 2023.  Of note the patient has a history of colorectal cancer treated at [facility] in 2014.  He status post surgical resection followed by adjuvant systemic chemotherapy.  The patient completed a course of concurrent chemoradiation with weekly carboplatin for an AUC of 2 and paclitaxel 45 mg per metered squared.  The patient is status post 7 cycles.  His last dose of treatment was given on 10/26/2021.  The patient tolerated treatment fairly well except for mild odynophagia.  He continues to have baseline shortness of breath secondary to his COPD and radiation.  The patient is currently undergoing consolidation immunotherapy with Imfinzi 1500 mg IV  every 4 weeks.  He is status post 4 cycles.  The patient was seen with Dr. Julien Nordmann today.  Labs were reviewed.  Recommend that he proceed with cycle #5 today's schedule.  We will see him back for follow-up visit in 4 weeks for evaluation and repeat blood work before undergoing cycle #6.  He is currently on home oxygen for his COPD.  He will follow with Dr. Work from pulmonary medicine.  The patient was advised to call immediately if he has any concerning symptoms in the interval. The patient voices understanding of current disease status and treatment options and is in agreement with the current care plan. All questions were answered. The patient knows to call the clinic with any problems, questions or concerns. We can certainly see the patient much sooner if necessary      No orders of the defined types were placed in this encounter.    I spent {CHL ONC TIME VISIT - FMMCR:[id number]} counseling the  patient face to face. The total time spent in the appointment was {CHL ONC TIME VISIT - HEKBT:[id number]}.  Nidia Grogan L Mao Lockner, PA-C 03/21/22

## 2022-03-22 DIAGNOSIS — E1165 Type 2 diabetes mellitus with hyperglycemia: Secondary | ICD-10-CM | POA: Diagnosis not present

## 2022-03-22 DIAGNOSIS — I10 Essential (primary) hypertension: Secondary | ICD-10-CM | POA: Diagnosis not present

## 2022-03-22 DIAGNOSIS — J449 Chronic obstructive pulmonary disease, unspecified: Secondary | ICD-10-CM | POA: Diagnosis not present

## 2022-03-23 ENCOUNTER — Other Ambulatory Visit: Payer: Self-pay | Admitting: Physician Assistant

## 2022-03-23 ENCOUNTER — Inpatient Hospital Stay: Payer: Medicare HMO | Admitting: Physician Assistant

## 2022-03-23 ENCOUNTER — Inpatient Hospital Stay: Payer: Medicare HMO | Attending: Internal Medicine

## 2022-03-23 ENCOUNTER — Inpatient Hospital Stay: Payer: Medicare HMO

## 2022-03-23 ENCOUNTER — Telehealth: Payer: Self-pay

## 2022-03-23 DIAGNOSIS — C3492 Malignant neoplasm of unspecified part of left bronchus or lung: Secondary | ICD-10-CM

## 2022-03-23 NOTE — Telephone Encounter (Signed)
This nurse reached out to patient related to missed appointments for today.  Patient stated that when he saw Dr. [name] on 10/12 he was told that their office would reach out to this providers office to cancel his appointments.  Due to his breathing Dr. [name] does not feel he should receive treatment today per the patient.  This nurse acknowledged understanding and advised that the provider will be made aware.  No further questions or concerns at this time.

## 2022-03-29 ENCOUNTER — Encounter: Payer: Self-pay | Admitting: Internal Medicine

## 2022-04-10 DIAGNOSIS — I5022 Chronic systolic (congestive) heart failure: Secondary | ICD-10-CM | POA: Diagnosis not present

## 2022-04-10 DIAGNOSIS — J9601 Acute respiratory failure with hypoxia: Secondary | ICD-10-CM | POA: Diagnosis not present

## 2022-04-12 ENCOUNTER — Ambulatory Visit: Payer: Medicare HMO | Admitting: Internal Medicine

## 2022-04-12 ENCOUNTER — Encounter: Payer: Self-pay | Admitting: Internal Medicine

## 2022-04-12 VITALS — BP 138/84 | HR 62 | Temp 97.9°F | Ht 73.0 in | Wt 269.6 lb

## 2022-04-12 DIAGNOSIS — J9611 Chronic respiratory failure with hypoxia: Secondary | ICD-10-CM | POA: Diagnosis not present

## 2022-04-12 DIAGNOSIS — J9612 Chronic respiratory failure with hypercapnia: Secondary | ICD-10-CM | POA: Diagnosis not present

## 2022-04-12 DIAGNOSIS — J449 Chronic obstructive pulmonary disease, unspecified: Secondary | ICD-10-CM | POA: Diagnosis not present

## 2022-04-12 DIAGNOSIS — F17210 Nicotine dependence, cigarettes, uncomplicated: Secondary | ICD-10-CM | POA: Diagnosis not present

## 2022-04-12 DIAGNOSIS — R69 Illness, unspecified: Secondary | ICD-10-CM | POA: Diagnosis not present

## 2022-04-12 MED ORDER — PREDNISONE 10 MG PO TABS: ORAL_TABLET | ORAL | 2 refills | Status: AC

## 2022-04-12 NOTE — Progress Notes (Unsigned)
[name], male    DOB: 1956/04/13,     MRN: [id number]   Brief patient profile:  66  yowm  active smoker /MM  referred to pulmonary clinic in [place]  07/08/2021 by Domenic Polite  for copd eval with onset 2018       History of Present Illness  07/08/2021  Pulmonary/ 1st office eval/ Jessicaann Overbaugh / [place] [facility]  Chief Complaint  Patient presents with   Consult    Self referral for what patient thought was COPD and it was Afib.  Patient using oxygen at home prn.   Dyspnea:  walking around the yard x 5 min / crosses parking lots/ [employer] pushing cart not using 02 much with exertion Cough: minimal /no am flare or purulent sputum  Sleep: bed flat/ one pillow on 2lpm  SABA use:  has symbicort breztri albuterol listed and not clear how he really uses them 02  2lpm hs and prn  Rec Plan A = Automatic = Always=    Breztri Take 2 puffs first thing in am and then another 2 puffs about 12 hours later.   Work on inhaler technique:  Remember to warm up like a golfer Plan B = Backup (to supplement plan A, not to replace it) Only use your albuterol inhaler as a rescue medication  Plan C = Crisis (instead of Plan B but only if Plan B stops working) - only use your albuterol nebulizer if you first try Plan B and it fails to help > ok to use the nebulizer up to every 4 hours but if start needing it regularly call for immediate appointment Make sure you check your oxygen saturation  AT  your highest level of activity (not after you stop)   to be sure it stays over 90% Cxr 07/08/21 ? Lung mass  >>> CT 07/23/21 Rounded masslike area in the left upper lobe measuring up to 5.6 cm. Appearance is concerning for lung cancer. Associated left hilar adenopathy. Borderline sized mediastinal lymph nodes>>> PET 08/12/21  c/w Stage 3b lung ca > referred for fob/ebus (Dr Windell Norfolk)  Sq cell ca  Rx chemo and RT completed June 9th 2023   08/24/2021  f/u ov/[place] office/Doneen Ollinger re: GOLD 3 / restrictive component from  obesity  maint on breztri   Chief Complaint  Patient presents with   Follow-up    Patient here to go over CT and PET scan. Patient also had PFT done. Is scheduled for Bronch Thursday. Increased Productive cough with clear sputum and feels like breathing is better.  Dyspnea:  MMRC1 = can walk nl pace, flat grade, can't hurry or go uphills or steps s sob   Cough: cough after hs  Sleeping: bed is flat/ on side / 2 pillows  SABA use: a lot less now  02:  2lpm hs  and when walks > 90%  Covid status: vax 3  Rec Work on inhaler technique:   Keep walking and tracking your 02 saturation at peak exercise NOT after you stop with the goal is keep it over 90%  The key is to stop smoking completely before smoking completely stops you!      11/22/2021  f/u ov/[place] office/Finn Amos re: GOLD 3/ 02 dep  maint on breztri (but out x sev days) and still smoking   Chief Complaint  Patient presents with   Follow-up    Ct done on 6/29. Coughing has improved.   Dyspnea:  still MMRC 1  Cough: better  Sleeping:  on side L / flat bed / 2 pillows  SABA use: neither puff nor neb 02: prn rarely uses  Rec Plan A = Automatic = Always=    Breztri (or Symbicort ) 2 puffs first thing in am and the pm doses are optional  Plan B = Backup (to supplement plan A, not to replace it) Only use your albuterol inhaler as a rescue medication  Plan C = Crisis (instead of Plan B but only if Plan B stops working) - only use your albuterol nebulizer if you first try Plan B  Make sure you check your oxygen saturation  AT  your highest level of activity (not after you stop)   to be sure it stays over 90% a Please schedule a follow up visit in 6  months but call sooner if needed    03/03/2022  f/u ov/[facility] office/Desarae Placide re: GOLD 3/ prn 02 maint on breztri  / still smoking Chief Complaint  Patient presents with   Follow-up    Breathing has been worse for the last month or so   Dyspnea:  worse w/in  few weeks of imfinzy with  CT 02/21/22 showing more RT changes and small effusion on L  Cough: clear mucus assoc with smoker's rattle   esp in am Sleeping: flat bed on side 2 pillows  SABA use: occ albuterol  02: 3lpm hs and 3lpm with activity but RA ok at rest  Covid status: vax x 3  Rec Prednisone 10 mg take  4 each am x 2 days,   2 each am x 2 days,  1 each am x 2 days and stop  Work on inhaler technique:   For now will need to need to hold the imfinzi - I will notify Dr Earlie Server      Please schedule a follow up office visit in 6 weeks, call sooner if needed    04/12/2022  f/u ov/[place] office/Malayshia All re: GOLD 3 copd? Imfimz reaction  maint on breztri   Chief Complaint  Patient presents with   Follow-up    Breathing is better since last ov.  Wants to discuss prednisone    Dyspnea:  [employer] ok now s 02 / better on prednisone  Cough: mucoid/ no blood, seems worse with L side down at hs  Sleeping: flat bed 2 pillows  SABA use: rarely using  02: 3lpm hs and prn but not typically using when walking      No obvious day to day or daytime variability or assoc excess/ purulent sputum or mucus plugs or hemoptysis or cp or chest tightness, subjective wheeze or overt sinus or hb symptoms.   Sleeping  without nocturnal  or early am exacerbation  of respiratory  c/o's or need for noct saba. Also denies any obvious fluctuation of symptoms with weather or environmental changes or other aggravating or alleviating factors except as outlined above   No unusual exposure hx or h/o childhood pna/ asthma or knowledge of premature birth.  Current Allergies, Complete Past Medical History, Past Surgical History, Family History, and Social History were reviewed in [employer] record.  ROS  The following are not active complaints unless bolded Hoarseness, sore throat, dysphagia, dental problems, itching, sneezing,  nasal congestion or discharge of excess mucus or purulent secretions, ear ache,   fever,  chills, sweats, unintended wt loss or wt gain, classically pleuritic or exertional cp,  orthopnea pnd or arm/hand swelling  or leg swelling, presyncope, palpitations, abdominal pain, anorexia,  nausea, vomiting, diarrhea  or change in bowel habits or change in bladder habits, change in stools or change in urine, dysuria, hematuria,  rash, arthralgias, visual complaints, headache, numbness, weakness or ataxia or problems with walking or coordination,  change in mood or  memory.        Current Meds  Medication Sig   acetaminophen (TYLENOL) 325 MG tablet Take 2 tablets (650 mg total) by mouth every 6 (six) hours as needed for mild pain (or Fever >/= 101).   apixaban (ELIQUIS) 5 MG TABS tablet Take 1 tablet (5 mg total) by mouth 2 (two) times daily.   atorvastatin (LIPITOR) 20 MG tablet Take 1 tablet (20 mg total) by mouth every evening.   bisoprolol (ZEBETA) 5 MG tablet Take 1.5 tablets (7.5 mg total) by mouth daily.   Budeson-Glycopyrrol-Formoterol (BREZTRI AEROSPHERE) 160-9-4.8 MCG/ACT AERO Inhale 2 puffs into the lungs in the morning and at bedtime.   glimepiride (AMARYL) 2 MG tablet Take 1 tablet (2 mg total) by mouth every morning.   Multiple Vitamin (MULTI-VITAMIN DAILY PO) Take 1 tablet by mouth daily.   potassium chloride (KLOR-CON) 10 MEQ tablet Take 1 tablet (10 mEq total) by mouth daily. Take While taking Lasix/furosemide   torsemide 40 MG TABS Take 40 mg by mouth every morning.                Objective:    Wts  04/12/2022      269  03/03/2022     279   11/22/2021        266   08/24/21 264 lb 12.8 oz (120.1 kg)  07/08/21 264 lb (119.7 kg)  06/05/21 259 lb 0.7 oz (117.5 kg)     Vital signs reviewed  04/12/2022  - Note at rest 02 sats  94% on RA   General appearance:    amb mod obes wm/ slt gruff voice   HEENT : Oropharynx  clear   Nasal turbinates nl    NECK :  without  apparent JVD/ palpable Nodes/TM    LUNGS: no acc muscle use,  Mild barrel  contour chest wall with  bilateral  Distant bs esp L base  and  without cough on insp or exp maneuvers  and mild  Hyperresonant  to  percussion bilaterally     CV:  RRR  no s3 or murmur or increase in P2, and no edema   ABD:  soft and nontender with pos end  insp Hoover's  in the supine position.  No bruits or organomegaly appreciated   MS:  Nl gait/ ext warm without deformities Or obvious joint restrictions  calf tenderness, cyanosis or clubbing     SKIN: warm and dry without lesions    NEURO:  alert, approp, nl sensorium with  no motor or cerebellar deficits apparent.                  Chemistry      Component Value Date/Time   NA 142 02/23/2022 0922   K 4.7 02/23/2022 0922   CL 97 (L) 02/23/2022 0922   CO2 39 (H) 02/23/2022 0922   BUN 17 02/23/2022 0922   CREATININE 1.04 02/23/2022 0922      Component Value Date/Time   CALCIUM 9.2 02/23/2022 0922   ALKPHOS 98 02/23/2022 0922   AST 20 02/23/2022 0922   ALT 20 02/23/2022 0922   BILITOT 0.9 02/23/2022 8299  Assessment

## 2022-04-12 NOTE — Patient Instructions (Addendum)
Work on inhaler technique:  relax and gently blow all the way out then take a nice smooth full deep breath back in, triggering the inhaler at same time you start breathing in.  Hold breath in for at least  5 seconds if you can. Blow out breztri  thru nose. Rinse and gargle with water when done.  If mouth or throat bother you at all,  try brushing teeth/gums/tongue with arm and hammer toothpaste/ make a slurry and gargle and spit out.  - remember how golfers take practice swings  Make sure you check your oxygen saturation  AT  your highest level of activity (not after you stop)   to be sure it stays over 90% and adjust  02 flow upward to maintain this level if needed but remember to turn it back to previous settings when you stop (to conserve your supply).   Prednisone 10 mg x 2 daily with bfast until your breathing is the best its been then 10 mg daily for a week then 5mg  (one half a 10) until return -if losing ground go back to prior dose that worked   Please schedule a follow up office visit in 6 weeks, call sooner if needed

## 2022-04-13 ENCOUNTER — Encounter: Payer: Self-pay | Admitting: Internal Medicine

## 2022-04-13 NOTE — Assessment & Plan Note (Signed)
Counseled re importance of smoking cessation but did not meet time criteria for separate billing    Each maintenance medication was reviewed in detail including emphasizing most importantly the difference between maintenance and prns and under what circumstances the prns are to be triggered using an action plan format where appropriate.  Total time for H and P, chart review, counseling, reviewing hfa/neb device(s) , directly observing portions of ambulatory 02 saturation study/ and generating customized AVS unique to this office visit / same day charting = 30 min

## 2022-04-13 NOTE — Assessment & Plan Note (Signed)
Active smoker/MM  - 07/08/2021  After extensive coaching inhaler device,  effectiveness =    75% try breztri samples and f/u with pfts - 07/08/21  Alpha one AT  MM/ level  220  - 07/08/21  Eos 0.1  - PFT's  07/22/2021   FEV1 1.80 (49 % ) ratio 0.70  p 3 % improvement from saba p brezti x one puff  prior to study with DLCO  28.26 (39%)   FV curve mild concavity    - 11/22/2021   Walked on RA  x  3   lap(s) =  approx 300  ft  @ mod pace, stopped due to sob with lowest 02 sats 91%  - 04/12/2022  After extensive coaching inhaler device,  effectiveness =    60% (short Ti) - 04/12/2022 start pred ceiling 20  and floor 5 mg    Group D (now reclassified as E) in terms of symptom/risk and laba/lama/ICS  therefore appropriate rx at this point >>>  breztri 2bid and approp saba /keepi working on better hfa with neb backup

## 2022-04-29 ENCOUNTER — Ambulatory Visit
Admission: RE | Admit: 2022-04-29 | Discharge: 2022-04-29 | Disposition: A | Discharge status: Home / Self Care | Payer: Medicare HMO | Source: Ambulatory Visit | Attending: Internal Medicine | Admitting: Internal Medicine

## 2022-04-29 DIAGNOSIS — R9089 Other abnormal findings on diagnostic imaging of central nervous system: Secondary | ICD-10-CM

## 2022-04-29 DIAGNOSIS — R9082 White matter disease, unspecified: Secondary | ICD-10-CM | POA: Diagnosis not present

## 2022-04-29 DIAGNOSIS — I6789 Other cerebrovascular disease: Secondary | ICD-10-CM | POA: Diagnosis not present

## 2022-04-29 MED ORDER — GADOPICLENOL 0.5 MMOL/ML IV SOLN
10.0000 mL | Freq: Once | INTRAVENOUS | Status: AC | PRN
  Administered 2022-04-29: 10 mL via INTRAVENOUS

## 2022-05-02 ENCOUNTER — Inpatient Hospital Stay: Payer: Medicare HMO | Attending: Internal Medicine | Admitting: Internal Medicine

## 2022-05-02 VITALS — BP 132/69 | HR 80 | Temp 97.6°F | Resp 18 | Wt 276.4 lb

## 2022-05-02 DIAGNOSIS — I639 Cerebral infarction, unspecified: Secondary | ICD-10-CM | POA: Diagnosis not present

## 2022-05-02 DIAGNOSIS — R69 Illness, unspecified: Secondary | ICD-10-CM | POA: Diagnosis not present

## 2022-05-02 DIAGNOSIS — Z923 Personal history of irradiation: Secondary | ICD-10-CM | POA: Insufficient documentation

## 2022-05-02 DIAGNOSIS — Z7901 Long term (current) use of anticoagulants: Secondary | ICD-10-CM | POA: Insufficient documentation

## 2022-05-02 DIAGNOSIS — C3492 Malignant neoplasm of unspecified part of left bronchus or lung: Secondary | ICD-10-CM

## 2022-05-02 DIAGNOSIS — Z79899 Other long term (current) drug therapy: Secondary | ICD-10-CM | POA: Insufficient documentation

## 2022-05-02 DIAGNOSIS — F17210 Nicotine dependence, cigarettes, uncomplicated: Secondary | ICD-10-CM | POA: Diagnosis not present

## 2022-05-02 DIAGNOSIS — I110 Hypertensive heart disease with heart failure: Secondary | ICD-10-CM | POA: Insufficient documentation

## 2022-05-02 DIAGNOSIS — C3412 Malignant neoplasm of upper lobe, left bronchus or lung: Secondary | ICD-10-CM | POA: Diagnosis not present

## 2022-05-02 DIAGNOSIS — I509 Heart failure, unspecified: Secondary | ICD-10-CM | POA: Insufficient documentation

## 2022-05-02 NOTE — Progress Notes (Signed)
[facility] [place], [place] [address] [phone]   Interval Evaluation  Date of Service: 05/02/22 Patient Name: [name] Patient MRN: [id number] Patient DOB: [birth year] Provider: Ventura Sellers, MD  Identifying Statement:  [name] is a 66 y.o. male with Cerebrovascular accident (CVA), unspecified mechanism ([place])  Stage III squamous cell carcinoma of left lung ([place]) - Plan: Clinic Appointment Request   Primary Cancer:  Oncologic History: Oncology History  Stage III squamous cell carcinoma of left lung ([place])  09/01/2021 Initial Diagnosis   Stage III squamous cell carcinoma of left lung ([place])   09/13/2021 - 10/26/2021 Chemotherapy   Patient is on Treatment Plan : LUNG Carboplatin / Paclitaxel + XRT q7d     12/01/2021 -  Chemotherapy   Patient is on Treatment Plan : LUNG NSCLC Durvalumab (1500) q28d       Interval History: [name] presents today for follow up after recent MRI brain.  Denies any neurologic deficits or changes.  No headaches, gait issues.  Continues on supplemental oxygen, several months since last Ifimzi treatment.  H+P (11/01/21) Patient presents today for evaluation for abnormal brain MRI.  He obtained a screening brain MRI 1 month ago with new diagnosis of non small cell lung cancer.  Scan demonstrated three abnormal foci of uncertain etiology; none of these were symptomatic in any way.  We then recommended follow up MRI 1 month later, which he obtained this week.  He has recently completed chemoradiation for stage 3 lung cancer with Dr. Julien Nordmann.  Medications: Current Outpatient Medications on File Prior to Visit  Medication Sig Dispense Refill   acetaminophen (TYLENOL) 325 MG tablet Take 2 tablets (650 mg total) by mouth every 6 (six) hours as needed for mild pain (or Fever >/= 101). 12 tablet 0   apixaban (ELIQUIS) 5 MG TABS tablet Take 1 tablet (5 mg total) by mouth 2 (two)  times daily. 60 tablet 2   atorvastatin (LIPITOR) 20 MG tablet Take 1 tablet (20 mg total) by mouth every evening. 30 tablet 3   bisoprolol (ZEBETA) 5 MG tablet Take 1.5 tablets (7.5 mg total) by mouth daily. 135 tablet 3   Budeson-Glycopyrrol-Formoterol (BREZTRI AEROSPHERE) 160-9-4.8 MCG/ACT AERO Inhale 2 puffs into the lungs in the morning and at bedtime. 10.7 g 11   glimepiride (AMARYL) 2 MG tablet Take 1 tablet (2 mg total) by mouth every morning. 30 tablet 11   Multiple Vitamin (MULTI-VITAMIN DAILY PO) Take 1 tablet by mouth daily.     potassium chloride (KLOR-CON) 10 MEQ tablet Take 1 tablet (10 mEq total) by mouth daily. Take While taking Lasix/furosemide 30 tablet 2   predniSONE (DELTASONE) 10 MG tablet 2 daily until better, then 1 daily x 1 week then 1/2 daily 100 tablet 2   torsemide 40 MG TABS Take 40 mg by mouth every morning. 30 tablet 2   No current facility-administered medications on file prior to visit.    Allergies:  Allergies  Allergen Reactions   Paclitaxel Shortness Of Breath    Hypersensitivity reaction. See progress note dated 09/27/2021.   Penicillins Other (See Comments)    Childhood Allergy  Has patient had a PCN reaction causing immediate rash, facial/tongue/throat swelling, SOB or lightheadedness with hypotension: No Has patient had a PCN reaction causing severe rash involving mucus membranes or skin necrosis: No Has patient had a PCN reaction that required hospitalization: No Has patient had a PCN reaction  occurring within the last 10 years: No If all of the above answers are "NO", then may proceed with Cephalosporin use.    Past Medical History:  Past Medical History:  Diagnosis Date   Atrial flutter ([place])    s/p DCCV 06/03/21   CHF (congestive heart failure) ([place])    Colon cancer ([place]) 04/22/2013   Hypertension    Lung cancer ([place]) 08/26/2021   Renal disorder    Kidney stones   Past Surgical History:  Past Surgical History:  Procedure Laterality  Date   BRONCHIAL BIOPSY  08/26/2021   Procedure: BRONCHIAL BIOPSIES;  Surgeon: Garner Nash, DO;  Location: [facility];  Service: Pulmonary;;   BRONCHIAL BRUSHINGS  08/26/2021   Procedure: BRONCHIAL BRUSHINGS;  Surgeon: Garner Nash, DO;  Location: [facility];  Service: Pulmonary;;   BRONCHIAL NEEDLE ASPIRATION BIOPSY  08/26/2021   Procedure: BRONCHIAL NEEDLE ASPIRATION BIOPSIES;  Surgeon: Garner Nash, DO;  Location: [facility];  Service: Pulmonary;;   CARDIOVERSION N/A 06/03/2021   Procedure: CARDIOVERSION;  Surgeon: Satira Sark, MD;  [facility];  Service: Cardiovascular;  Laterality: N/A;   COLONOSCOPY N/A 04/29/2013   Procedure: COLONOSCOPY;  Surgeon: Danie Binder, MD;  [facility];  Service: Endoscopy;  Laterality: N/A;  10:30AM   Cyst removed from left elbow     ENDOVENOUS ABLATION SAPHENOUS VEIN W/ LASER Left 01/16/2019   endovenous laser ablation left greater saphenous vein by Ruta Hinds MD   KIDNEY STONE SURGERY     LITHOTRIPSY     PARTIAL COLECTOMY     TEE WITHOUT CARDIOVERSION N/A 06/03/2021   Procedure: TRANSESOPHAGEAL ECHOCARDIOGRAM (TEE);  Surgeon: Satira Sark, MD;  Location: [facility];  Service: Cardiovascular;  Laterality: N/A;   VIDEO BRONCHOSCOPY WITH ENDOBRONCHIAL ULTRASOUND Bilateral 08/26/2021   Procedure: VIDEO BRONCHOSCOPY WITH ENDOBRONCHIAL ULTRASOUND;  Surgeon: Garner Nash, DO;  Location: [facility];  Service: Pulmonary;  Laterality: Bilateral;  will need Guardant [id number]   Social History:  Social History   Socioeconomic History   Marital status: Married    Spouse name: Not on file   Number of children: Not on file   Years of education: Not on file   Highest education level: Not on file  Occupational History   Not on file  Tobacco Use   Smoking status: Every Day    Packs/day: 0.50    Years: 20.00    Total pack years: 10.00    Types: Cigarettes   Smokeless tobacco: Never   Tobacco comments:    1 pack a  day MRC 08/24/21  Vaping Use   Vaping Use: Never used  Substance and Sexual Activity   Alcohol use: Yes    Alcohol/week: 12.0 standard drinks of alcohol    Types: 6 Cans of beer, 6 Shots of liquor per week    Comment: couple mixed drinks at night as of 05/31/21   Drug use: No   Sexual activity: Not on file  Other Topics Concern   Not on file  Social History Narrative   Not on file   Social Determinants of Health   Financial Resource Strain: Medium Risk (01/14/2022)   Overall Financial Resource Strain (CARDIA)    Difficulty of Paying Living Expenses: Somewhat hard  Food Insecurity: No Food Insecurity (01/14/2022)   Hunger Vital Sign    Worried About Running Out of Food in the Last Year: Never true    Ran Out of Food in the Last Year: Never true  Transportation Needs: No Transportation Needs (01/14/2022)   PRAPARE - Hydrologist (Medical): No    Lack of Transportation (Non-Medical): No  Physical Activity: Not on file  Stress: Not on file  Social Connections: Not on file  Intimate Partner Violence: Not on file   Family History:  Family History  Problem Relation Age of Onset   Cancer Mother        pancreatic   Colon cancer Neg Hx     Review of Systems: Constitutional: Doesn't report fevers, chills or abnormal weight loss Eyes: Doesn't report blurriness of vision Ears, nose, mouth, throat, and face: Doesn't report sore throat Respiratory: Doesn't report cough, dyspnea or wheezes Cardiovascular: Doesn't report palpitation, chest discomfort  Gastrointestinal:  Doesn't report nausea, constipation, diarrhea GU: Doesn't report incontinence Skin: Doesn't report skin rashes Neurological: Per HPI Musculoskeletal: Doesn't report joint pain Behavioral/Psych: Doesn't report anxiety  Physical Exam: Vitals:   05/02/22 1022  BP: 132/69  Pulse: 80  Resp: 18  Temp: 97.6 F (36.4 C)  SpO2: 99%   KPS: 90. General: Alert, cooperative, pleasant, in no  acute distress Head: Normal EENT: No conjunctival injection or scleral icterus.  Lungs: Resp effort normal Cardiac: Regular rate Abdomen: Non-distended abdomen Skin: No rashes cyanosis or petechiae. Extremities: No clubbing or edema  Neurologic Exam: Mental Status: Awake, alert, attentive to examiner. Oriented to self and environment. Language is fluent with intact comprehension.  Cranial Nerves: Visual acuity is grossly normal. Visual fields are full. Extra-ocular movements intact. No ptosis. Face is symmetric Motor: Tone and bulk are normal. Power is full in both arms and legs. Reflexes are symmetric, no pathologic reflexes present.  Sensory: Intact to light touch Gait: Normal.   Labs: I have reviewed the data as listed    Component Value Date/Time   NA 142 02/23/2022 0922   K 4.7 02/23/2022 0922   CL 97 (L) 02/23/2022 0922   CO2 39 (H) 02/23/2022 0922   GLUCOSE 205 (H) 02/23/2022 0922   BUN 17 02/23/2022 0922   CREATININE 1.04 02/23/2022 0922   CALCIUM 9.2 02/23/2022 0922   PROT 6.5 02/23/2022 0922   ALBUMIN 4.0 02/23/2022 0922   AST 20 02/23/2022 0922   ALT 20 02/23/2022 0922   ALKPHOS 98 02/23/2022 0922   BILITOT 0.9 02/23/2022 0922   GFRNONAA >60 02/23/2022 0922   GFRAA >60 02/02/2018 0507   Lab Results  Component Value Date   WBC 6.1 02/23/2022   NEUTROABS 4.8 02/23/2022   HGB 12.5 (L) 02/23/2022   HCT 38.9 (L) 02/23/2022   MCV 109.3 (H) 02/23/2022   PLT 148 (L) 02/23/2022    Imaging:  MR BRAIN W WO CONTRAST  Result Date: 05/01/2022 CLINICAL DATA:  Brain/CNS neoplasm.  Assess treatment response. EXAM: MRI HEAD WITHOUT AND WITH CONTRAST TECHNIQUE: Multiplanar, multiecho pulse sequences of the brain and surrounding structures were obtained without and with intravenous contrast. CONTRAST:  10 cc vue way COMPARISON:  10/21/2021 FINDINGS: Brain: Diffusion imaging is now normal. No abnormality affects the brainstem or cerebellum. Cerebral hemispheres show  moderate to severe chronic small-vessel ischemic changes throughout the white matter. Late subacute/chronic infarctions at the cortical and subcortical brain of a deep sulcus of the right parietooccipital junction and within the white matter adjacent to the atrium of the left lateral ventricle have undergone expected evolutionary changes. Mild hemosiderin deposition, particularly at the right parietooccipital location. No contrast enhancement in either location. No sign of mass, hydrocephalus or extra-axial collection. Vascular:  There is atherosclerotic calcification of the major vessels at the base of the brain. Skull and upper cervical spine: Negative Sinuses/Orbits: Clear except for minimal seasonal mucosal thickening. Orbits negative. Other: None IMPRESSION: 1. No evidence of metastatic disease. 2. Moderate to severe chronic small-vessel ischemic changes throughout the cerebral hemispheric white matter. 3. Expected evolutionary changes of late subacute/chronic infarctions at the cortical and subcortical brain of a deep sulcus of the right parietooccipital junction and within the white matter adjacent to the atrium of the left lateral ventricle. Electronically Signed   By: Nelson Chimes M.D.   On: 05/01/2022 16:53    [facility] Clinician Interpretation: I have personally reviewed the radiological images as listed.  My interpretation, in the context of the patient's clinical presentation, is  vascular phenomena   Assessment/Plan Cerebrovascular accident (CVA), unspecified mechanism ([place])  Stage III squamous cell carcinoma of left lung [facility]) - Plan: Clinic Appointment Request  ALCUS BRADLY is clinically stable today.  MRI demonstrates no evidence of any neoplastic process; we can attribute all prior findings to stroke/vascular.  Etiology for stroke is likely cardio-embolic, given relatively recent diagnosis of atrial flutter.  He is now anticoagulated with Eliquis, tolerating it well.  He will be at  high risk for stroke if anticoagulation need to be held.    We appreciate the opportunity to participate in the care of NKOSI CORTRIGHT.   We ask that Amin Fornwalt Knaak return to clinic as needed with new or progressive symptoms.  All questions were answered. The patient knows to call the clinic with any problems, questions or concerns. No barriers to learning were detected.  The total time spent in the encounter was 30 minutes and more than 50% was on counseling and review of test results   Ventura Sellers, MD Medical Director of Neuro-Oncology [facility] at [place] 05/02/22 10:30 AM

## 2022-05-10 DIAGNOSIS — I5022 Chronic systolic (congestive) heart failure: Secondary | ICD-10-CM | POA: Diagnosis not present

## 2022-05-10 DIAGNOSIS — J9601 Acute respiratory failure with hypoxia: Secondary | ICD-10-CM | POA: Diagnosis not present

## 2022-05-24 ENCOUNTER — Other Ambulatory Visit: Payer: Medicare HMO

## 2022-05-26 ENCOUNTER — Encounter: Payer: Self-pay | Admitting: Internal Medicine

## 2022-05-26 ENCOUNTER — Other Ambulatory Visit: Payer: Self-pay

## 2022-05-26 ENCOUNTER — Ambulatory Visit (INDEPENDENT_AMBULATORY_CARE_PROVIDER_SITE_OTHER): Payer: Medicare HMO | Admitting: Internal Medicine

## 2022-05-26 VITALS — BP 144/90 | HR 92 | Temp 97.6°F | Ht 73.0 in | Wt 278.2 lb

## 2022-05-26 DIAGNOSIS — J9612 Chronic respiratory failure with hypercapnia: Secondary | ICD-10-CM | POA: Diagnosis not present

## 2022-05-26 DIAGNOSIS — R918 Other nonspecific abnormal finding of lung field: Secondary | ICD-10-CM | POA: Diagnosis not present

## 2022-05-26 DIAGNOSIS — J9611 Chronic respiratory failure with hypoxia: Secondary | ICD-10-CM | POA: Diagnosis not present

## 2022-05-26 DIAGNOSIS — J449 Chronic obstructive pulmonary disease, unspecified: Secondary | ICD-10-CM

## 2022-05-26 NOTE — Patient Instructions (Addendum)
Continue breztri 2 every 12 hours   Work on inhaler technique:  relax and gently blow all the way out then take a nice smooth full deep breath back in, triggering the inhaler at same time you start breathing in.  Hold breath in for at least  5 seconds if you can. Blow out breztri thru nose. Rinse and gargle with water when done.  If mouth or throat bother you at all,  try brushing teeth/gums/tongue with arm and hammer toothpaste/ make a slurry and gargle and spit out.  - remember how golfers take practice and use the empty device to practice   Make sure you check your oxygen saturation  AT  your highest level of activity (not after you stop)   to be sure it stays over 90% and adjust  02 flow upward to maintain this level if needed but remember to turn it back to previous settings when you stop (to conserve your supply).   Prednisone ceiling is 20 mg daily and the floor 5 mg   Strongly recommend you take the most recent covid vaccine and the RSV vaccine also but I would take a week apart   Late add:  The key is to stop smoking completely before smoking completely stops you!   Please schedule a follow up office visit in 6 weeks, call sooner if needed

## 2022-05-26 NOTE — Progress Notes (Unsigned)
[name], male    DOB: [birth year],     MRN: [id number]   Brief patient profile:  45  yowm  active smoker /MM  referred to pulmonary clinic in [place]  07/08/2021 by Domenic Polite  for copd eval with onset 2018       History of Present Illness  07/08/2021  Pulmonary/ 1st office eval/ Sante[facility] / [place] Office  Chief Complaint  Patient presents with   Consult    Self referral for what patient thought was COPD and it was Afib.  Patient using oxygen at home prn.   Dyspnea:  walking around the yard x 5 min / crosses parking lots/ [employer] pushing cart not using 02 much with exertion Cough: minimal /no am flare or purulent sputum  Sleep: bed flat/ one pillow on 2lpm  SABA use:  has symbicort breztri albuterol listed and not clear how he really uses them 02  2lpm hs and prn  Rec Plan A = Automatic = Always=    Breztri Take 2 puffs first thing in am and then another 2 puffs about 12 hours later.   Work on inhaler technique:  Remember to warm up like a golfer Plan B = Backup (to supplement plan A, not to replace it) Only use your albuterol inhaler as a rescue medication  Plan C = Crisis (instead of Plan B but only if Plan B stops working) - only use your albuterol nebulizer if you first try Plan B and it fails to help > ok to use the nebulizer up to every 4 hours but if start needing it regularly call for immediate appointment Make sure you check your oxygen saturation  AT  your highest level of activity (not after you stop)   to be sure it stays over 90% Cxr 07/08/21 ? Lung mass  >>> CT 07/23/21 Rounded masslike area in the left upper lobe measuring up to 5.6 cm. Appearance is concerning for lung cancer. Associated left hilar adenopathy. Borderline sized mediastinal lymph nodes>>> PET 08/12/21  c/w Stage 3b lung ca > referred for fob/ebus (Dr Windell [place])  Sq cell ca  Rx chemo and RT completed June 9th 2023      11/22/2021  f/u ov/[place] office/Toby Ayad re: GOLD 3/ 02 dep  maint on breztri  (but out x sev days) and still smoking   Chief Complaint  Patient presents with   Follow-up    Ct done on 6/29. Coughing has improved.   Dyspnea:  still MMRC 1  Cough: better  Sleeping: on side L / flat bed / 2 pillows  SABA use: neither puff nor neb 02: prn rarely uses  Rec Plan A = Automatic = Always=    Breztri (or Symbicort ) 2 puffs first thing in am and the pm doses are optional  Plan B = Backup (to supplement plan A, not to replace it) Only use your albuterol inhaler as a rescue medication  Plan C = Crisis (instead of Plan B but only if Plan B stops working) - only use your albuterol nebulizer if you first try Plan B  Make sure you check your oxygen saturation  AT  your highest level of activity (not after you stop)   to be sure it stays over 90% a Please schedule a follow up visit in 6  months but call sooner if needed       04/12/2022  f/u ov/[place] office/Myrl Lazarus re: GOLD 3 copd? Imfimz reaction  maint on breztri  / only  better while on prednisone Chief Complaint  Patient presents with   Follow-up    Breathing is better since last ov.  Wants to discuss prednisone   Dyspnea:  [employer] ok now s 02 / better on prednisone  Cough: mucoid/ no blood, seems worse with L side down at hs  Sleeping: flat bed 2 pillows  SABA use: rarely using  02: 3lpm hs and prn but not typically using when walking  Rec Work on inhaler technique:   - remember how golfers take practice swings Make sure you check your oxygen saturation  AT  your highest level of activity (not after you stop)   to be sure it stays over 90% Prednisone 10 mg x 2 daily with bfast until your breathing is the best its been then 10 mg daily for a week then 5mg  (one half a 10) until return -if losing ground go back to prior dose that worked    05/26/2022  f/u ov/[place] office/Deray Dawes re: GOLD 3 but hypercarbic and hypoxemic RF  maint on still on 10 mg pred  and breztri 2 bid  Chief Complaint  Patient presents with    Follow-up    Using 3-4LO2 cont    Dyspnea:  still doing [employer] and walking to out building 500 ft level s 02  Cough: min clear  Sleeping: on side flat bed / 2 pillows SABA use: rarely  02: 3lpm hs  and prn  Covid status: has not done latest   No obvious day to day or daytime variability or assoc excess/ purulent sputum or mucus plugs or hemoptysis or cp or chest tightness, subjective wheeze or overt sinus or hb symptoms.   Sleeping  without nocturnal  or early am exacerbation  of respiratory  c/o's or need for noct saba. Also denies any obvious fluctuation of symptoms with weather or environmental changes or other aggravating or alleviating factors except as outlined above   No unusual exposure hx or h/o childhood pna/ asthma or knowledge of premature birth.  Current Allergies, Complete Past Medical History, Past Surgical History, Family History, and Social History were reviewed in [employer] record.  ROS  The following are not active complaints unless bolded Hoarseness, sore throat, dysphagia, dental problems, itching, sneezing,  nasal congestion or discharge of excess mucus or purulent secretions, ear ache,   fever, chills, sweats, unintended wt loss or wt gain, classically pleuritic or exertional cp,  orthopnea pnd or arm/hand swelling  or leg swelling, presyncope, palpitations, abdominal pain, anorexia, nausea, vomiting, diarrhea  or change in bowel habits or change in bladder habits, change in stools or change in urine, dysuria, hematuria,  rash, arthralgias, visual complaints, headache, numbness, weakness or ataxia or problems with walking or coordination,  change in mood or  memory.        Current Meds  Medication Sig   acetaminophen (TYLENOL) 325 MG tablet Take 2 tablets (650 mg total) by mouth every 6 (six) hours as needed for mild pain (or Fever >/= 101).   apixaban (ELIQUIS) 5 MG TABS tablet Take 1 tablet (5 mg total) by mouth 2 (two) times daily.    atorvastatin (LIPITOR) 20 MG tablet Take 1 tablet (20 mg total) by mouth every evening.   bisoprolol (ZEBETA) 5 MG tablet Take 1.5 tablets (7.5 mg total) by mouth daily.   Budeson-Glycopyrrol-Formoterol (BREZTRI AEROSPHERE) 160-9-4.8 MCG/ACT AERO Inhale 2 puffs into the lungs in the morning and at bedtime.   glimepiride (AMARYL) 2 MG  tablet Take 1 tablet (2 mg total) by mouth every morning.   Multiple Vitamin (MULTI-VITAMIN DAILY PO) Take 1 tablet by mouth daily.   potassium chloride (KLOR-CON) 10 MEQ tablet Take 1 tablet (10 mEq total) by mouth daily. Take While taking Lasix/furosemide   predniSONE (DELTASONE) 10 MG tablet 2 daily until better, then 1 daily x 1 week then 1/2 daily   torsemide 40 MG TABS Take 40 mg by mouth every morning.                  Objective:    Wts  05/26/2022          278  04/12/2022      269  03/03/2022     279   11/22/2021        266   08/24/21 264 lb 12.8 oz (120.1 kg)  07/08/21 264 lb (119.7 kg)  06/05/21 259 lb 0.7 oz (117.5 kg)    Vital signs reviewed  05/26/2022  - Note at rest 02 sats  90% on 4lpm    General appearance:    obese amb wm smoker's rattle    HEENT : Oropharynx  clear       NECK :  without  apparent JVD/ palpable Nodes/TM    LUNGS: no acc muscle use,  Mild barrel  contour chest wall with bilateral  Distant bs s audible wheeze and  without cough on insp or exp maneuvers  and mild  Hyperresonant  to  percussion bilaterally     CV:  RRR  no s3 or murmur or increase in P2, and  pitting edema L leg (baseline)  ABD:  soft and nontender with pos end  insp Hoover's  in the supine position.  No bruits or organomegaly appreciated   MS:  Nl gait/ ext warm without deformities Or obvious joint restrictions  calf tenderness, cyanosis or clubbing     SKIN: warm and dry without lesions    NEURO:  alert, approp, nl sensorium with  no motor or cerebellar deficits apparent.        I personally reviewed images and agree with radiology  impression as follows:   Chest CTwith contrast    05/27/2022  Improved vs prior, effusion on L resolved              Assessment

## 2022-05-27 ENCOUNTER — Inpatient Hospital Stay: Payer: Medicare HMO | Attending: Internal Medicine

## 2022-05-27 ENCOUNTER — Ambulatory Visit (HOSPITAL_COMMUNITY)
Admission: RE | Admit: 2022-05-27 | Discharge: 2022-05-27 | Disposition: A | Discharge status: Home / Self Care | Payer: Medicare HMO | Source: Ambulatory Visit | Attending: Physician Assistant | Admitting: Physician Assistant

## 2022-05-27 DIAGNOSIS — Z9981 Dependence on supplemental oxygen: Secondary | ICD-10-CM | POA: Insufficient documentation

## 2022-05-27 DIAGNOSIS — C3492 Malignant neoplasm of unspecified part of left bronchus or lung: Secondary | ICD-10-CM | POA: Insufficient documentation

## 2022-05-27 DIAGNOSIS — J439 Emphysema, unspecified: Secondary | ICD-10-CM | POA: Diagnosis not present

## 2022-05-27 DIAGNOSIS — Z85038 Personal history of other malignant neoplasm of large intestine: Secondary | ICD-10-CM | POA: Insufficient documentation

## 2022-05-27 DIAGNOSIS — J90 Pleural effusion, not elsewhere classified: Secondary | ICD-10-CM | POA: Diagnosis not present

## 2022-05-27 DIAGNOSIS — E034 Atrophy of thyroid (acquired): Secondary | ICD-10-CM | POA: Diagnosis not present

## 2022-05-27 DIAGNOSIS — C3412 Malignant neoplasm of upper lobe, left bronchus or lung: Secondary | ICD-10-CM | POA: Insufficient documentation

## 2022-05-27 DIAGNOSIS — J449 Chronic obstructive pulmonary disease, unspecified: Secondary | ICD-10-CM | POA: Insufficient documentation

## 2022-05-27 DIAGNOSIS — C3490 Malignant neoplasm of unspecified part of unspecified bronchus or lung: Secondary | ICD-10-CM | POA: Diagnosis not present

## 2022-05-27 LAB — CBC WITH DIFFERENTIAL (CANCER CENTER ONLY)
Abs Immature Granulocytes: 0.08 10*3/uL — ABNORMAL HIGH (ref 0.00–0.07)
Basophils Absolute: 0.1 10*3/uL (ref 0.0–0.1)
Basophils Relative: 0 %
Eosinophils Absolute: 0 10*3/uL (ref 0.0–0.5)
Eosinophils Relative: 0 %
HCT: 42.1 % (ref 39.0–52.0)
Hemoglobin: 13.8 g/dL (ref 13.0–17.0)
Immature Granulocytes: 1 %
Lymphocytes Relative: 7 %
Lymphs Abs: 0.8 10*3/uL (ref 0.7–4.0)
MCH: 34.2 pg — ABNORMAL HIGH (ref 26.0–34.0)
MCHC: 32.8 g/dL (ref 30.0–36.0)
MCV: 104.2 fL — ABNORMAL HIGH (ref 80.0–100.0)
Monocytes Absolute: 0.7 10*3/uL (ref 0.1–1.0)
Monocytes Relative: 6 %
Neutro Abs: 10.6 10*3/uL — ABNORMAL HIGH (ref 1.7–7.7)
Neutrophils Relative %: 86 %
Platelet Count: 154 10*3/uL (ref 150–400)
RBC: 4.04 MIL/uL — ABNORMAL LOW (ref 4.22–5.81)
RDW: 14.7 % (ref 11.5–15.5)
WBC Count: 12.3 10*3/uL — ABNORMAL HIGH (ref 4.0–10.5)
nRBC: 0 % (ref 0.0–0.2)

## 2022-05-27 LAB — CMP (CANCER CENTER ONLY)
ALT: 29 U/L (ref 0–44)
AST: 19 U/L (ref 15–41)
Albumin: 4 g/dL (ref 3.5–5.0)
Alkaline Phosphatase: 87 U/L (ref 38–126)
Anion gap: 4 — ABNORMAL LOW (ref 5–15)
BUN: 17 mg/dL (ref 8–23)
CO2: 35 mmol/L — ABNORMAL HIGH (ref 22–32)
Calcium: 9.6 mg/dL (ref 8.9–10.3)
Chloride: 104 mmol/L (ref 98–111)
Creatinine: 0.96 mg/dL (ref 0.61–1.24)
GFR, Estimated: >60 mL/min (ref >60)
Glucose, Bld: 159 mg/dL — ABNORMAL HIGH (ref 70–99)
Potassium: 5.1 mmol/L (ref 3.5–5.1)
Sodium: 143 mmol/L (ref 135–145)
Total Bilirubin: 0.5 mg/dL (ref 0.3–1.2)
Total Protein: 6.4 g/dL — ABNORMAL LOW (ref 6.5–8.1)

## 2022-05-27 LAB — TSH: TSH: 1.231 u[IU]/mL (ref 0.350–4.500)

## 2022-05-27 MED ORDER — IOHEXOL 300 MG/ML  SOLN
100.0000 mL | Freq: Once | INTRAMUSCULAR | Status: AC | PRN
  Administered 2022-05-27: 100 mL via INTRAVENOUS

## 2022-05-27 MED ORDER — SODIUM CHLORIDE (PF) 0.9 % IJ SOLN
INTRAMUSCULAR | Status: AC
  Filled 2022-05-27: qty 50

## 2022-05-27 NOTE — Assessment & Plan Note (Signed)
Active smoker/MM  - 07/08/2021  After extensive coaching inhaler device,  effectiveness =    75% try breztri samples and f/u with pfts - 07/08/21  Alpha one AT  MM/ level  220  - 07/08/21  Eos 0.1  - PFT's  07/22/2021   FEV1 1.80 (49 % ) ratio 0.70  p 3 % improvement from saba p brezti x one puff  prior to study with DLCO  28.26 (39%)   FV curve mild concavity    - 11/22/2021   Walked on RA  x  3   lap(s) =  approx 300  ft  @ mod pace, stopped due to sob with lowest 02 sats 91% - 04/12/2022  After extensive coaching inhaler device,  effectiveness =    60% (short Ti) - 04/12/2022 start pred ceiling 20  and floor 5 mg  - 05/26/2022  After extensive coaching inhaler device,  effectiveness =    75% > continue breztri    Group D (now reclassified as E) in terms of symptom/risk and laba/lama/ICS  therefore appropriate rx at this point >>>  breztri 2bid / pred fllor of 5 mg for now and approp saba:  Re SABA :  I spent extra time with pt today reviewing appropriate use of albuterol for prn use on exertion with the following points: 1) saba is for relief of sob that does not improve by walking a slower pace or resting but rather if the pt does not improve after trying this first. 2) If the pt is convinced, as many are, that saba helps recover from activity faster then it's easy to tell if this is the case by re-challenging : ie stop, take the inhaler, then p 5 minutes try the exact same activity (intensity of workload) that just caused the symptoms and see if they are substantially diminished or not after saba 3) if there is an activity that reproducibly causes the symptoms, try the saba 15 min before the activity on alternate days   If in fact the saba really does help, then fine to continue to use it prn but advised may need to look closer at the maintenance regimen being used to achieve better control of airways disease with exertion.

## 2022-05-27 NOTE — Assessment & Plan Note (Signed)
HC03   06/05/21   =   38  - 07/08/2021  patient walked at a moderate pace on room air x 300 ft. At end  desat to 88%. Placed on 2LO2 cont. patient sats increased to 93% for another 150 ft  - 11/22/2021 no desats walking 300 ft stopped due to sob  - 03/03/2022 sats ? 78% on 3 lpm cont  With h/o sats worse  few weeks of imfinzy with CT - 02/21/22 showing more RT changes and small effusion on L >> rec trial off imfinzy and rx prednisone with close f/u sats at rest and with activity > admit if worsening  - [facility]03  02/23/22  = 39  - 04/12/2022    RA  at rest sats 88% then on 3lpm    approx 450  ft  @ mod pace,  with lowest 02 sats 91%   Again advised: Make sure you check your oxygen saturation  AT  your highest level of activity (not after you stop)   to be sure it stays over 90% and adjust  02 flow upward to maintain this level if needed but remember to turn it back to previous settings when you stop (to conserve your supply).

## 2022-05-28 NOTE — Assessment & Plan Note (Addendum)
1st noted on cxr 05/31/21 on admit for chf - CT 07/23/21 Rounded masslike area in the left upper lobe measuring up to 5.6 cm. Appearance is concerning for lung cancer. Associated left hilar adenopathy. Borderline sized mediastinal lymph nodes>>> PET 08/12/21  c/w Stage 3b lung ca > referred for fob/ebus (Dr Windell [place]) Sq cell ca Rx chemo and RT completed June 9th 2023  - CT 11/18/21 marginally improved, cavity now in LUL c/w tissue necrosis  - CT 05/27/2022 : Improved vs prior, effusion on L resolved   Improved as above, f/u per oncology          Each maintenance medication was reviewed in detail including emphasizing most importantly the difference between maintenance and prns and under what circumstances the prns are to be triggered using an action plan format where appropriate.  Total time for H and P, chart review, counseling, reviewing hfa/n3b/02  device(s) and generating customized AVS unique to this office visit / same day charting = 30 min

## 2022-05-30 DIAGNOSIS — Z7984 Long term (current) use of oral hypoglycemic drugs: Secondary | ICD-10-CM | POA: Diagnosis not present

## 2022-05-30 DIAGNOSIS — E876 Hypokalemia: Secondary | ICD-10-CM | POA: Diagnosis not present

## 2022-05-30 DIAGNOSIS — G4733 Obstructive sleep apnea (adult) (pediatric): Secondary | ICD-10-CM | POA: Diagnosis not present

## 2022-05-30 DIAGNOSIS — D6869 Other thrombophilia: Secondary | ICD-10-CM | POA: Diagnosis not present

## 2022-05-30 DIAGNOSIS — E119 Type 2 diabetes mellitus without complications: Secondary | ICD-10-CM | POA: Diagnosis not present

## 2022-05-30 DIAGNOSIS — N529 Male erectile dysfunction, unspecified: Secondary | ICD-10-CM | POA: Diagnosis not present

## 2022-05-30 DIAGNOSIS — I4891 Unspecified atrial fibrillation: Secondary | ICD-10-CM | POA: Diagnosis not present

## 2022-05-30 DIAGNOSIS — I110 Hypertensive heart disease with heart failure: Secondary | ICD-10-CM | POA: Diagnosis not present

## 2022-05-30 DIAGNOSIS — I509 Heart failure, unspecified: Secondary | ICD-10-CM | POA: Diagnosis not present

## 2022-05-30 DIAGNOSIS — E785 Hyperlipidemia, unspecified: Secondary | ICD-10-CM | POA: Diagnosis not present

## 2022-05-30 DIAGNOSIS — I4892 Unspecified atrial flutter: Secondary | ICD-10-CM | POA: Diagnosis not present

## 2022-05-31 ENCOUNTER — Inpatient Hospital Stay: Payer: Medicare HMO | Admitting: Internal Medicine

## 2022-05-31 VITALS — BP 120/68 | HR 69 | Temp 98.1°F | Resp 18 | Wt 277.6 lb

## 2022-05-31 DIAGNOSIS — C3412 Malignant neoplasm of upper lobe, left bronchus or lung: Secondary | ICD-10-CM | POA: Diagnosis not present

## 2022-05-31 DIAGNOSIS — C3490 Malignant neoplasm of unspecified part of unspecified bronchus or lung: Secondary | ICD-10-CM | POA: Diagnosis not present

## 2022-05-31 DIAGNOSIS — Z9981 Dependence on supplemental oxygen: Secondary | ICD-10-CM | POA: Diagnosis not present

## 2022-05-31 DIAGNOSIS — J449 Chronic obstructive pulmonary disease, unspecified: Secondary | ICD-10-CM | POA: Diagnosis not present

## 2022-05-31 DIAGNOSIS — C3492 Malignant neoplasm of unspecified part of left bronchus or lung: Secondary | ICD-10-CM

## 2022-05-31 DIAGNOSIS — Z85038 Personal history of other malignant neoplasm of large intestine: Secondary | ICD-10-CM | POA: Diagnosis not present

## 2022-05-31 NOTE — Progress Notes (Signed)
[facility] [phone]) [phone]   [phone]  OFFICE PROGRESS NOTE  [name], [place] [address]  DIAGNOSIS:  Stage IIIb (T3, N2, M0) non-small cell lung cancer, squamous cell carcinoma presented with large left upper lobe lung mass in addition to mediastinal lymphadenopathy diagnosed in April 2023. The patient has a history of colon cancer treated at [facility] in 2014 status post surgical resection followed by adjuvant systemic chemotherapy.  PRIOR THERAPY: \ 1) Concurrent chemoradiation with weekly carboplatin for AUC of 2 and paclitaxel 45 Mg/M2.  Status post 7 cycle.  Last cycle was given on 10/26/2021 with partial response. 2) Consolidation treatment with immunotherapy with Imfinzi 1500 Mg IV every 4 weeks.  First dose December 01, 2021.  Status post 4 cycles.   CURRENT THERAPY: Observation.  INTERVAL HISTORY: [name] 67 y.o. male returns to the clinic today for follow-up visit accompanied by his wife.  The patient is feeling fine today with no concerning complaints except for the baseline shortness of breath and he is currently on home oxygen.  He is followed by Dr. [name] at [facility].  He denied having any current chest pain but has mild cough with no hemoptysis.  He has no nausea, vomiting, diarrhea or constipation.  He has no headache or visual changes.  He denied having any recent weight loss or night sweats.  His treatment with consolidation immunotherapy with Imfinzi was discontinued secondary to intolerance and worsening dyspnea and concern about immunotherapy mediated pneumonitis and frequent requirement of the patient for steroids for his COPD.  He is here today for evaluation and repeat CT scan of the chest for restaging of his disease.  MEDICAL HISTORY: Past Medical History:  Diagnosis Date   Atrial flutter ([place])    s/p DCCV 06/03/21   CHF (congestive heart failure) ([place])     Colon cancer ([place]) 04/22/2013   Hypertension    Lung cancer ([place]) 08/26/2021   Renal disorder    Kidney stones    ALLERGIES:  is allergic to paclitaxel and penicillins.  MEDICATIONS:  Current Outpatient Medications  Medication Sig Dispense Refill   acetaminophen (TYLENOL) 325 MG tablet Take 2 tablets (650 mg total) by mouth every 6 (six) hours as needed for mild pain (or Fever >/= 101). 12 tablet 0   apixaban (ELIQUIS) 5 MG TABS tablet Take 1 tablet (5 mg total) by mouth 2 (two) times daily. 60 tablet 2   atorvastatin (LIPITOR) 20 MG tablet Take 1 tablet (20 mg total) by mouth every evening. 30 tablet 3   bisoprolol (ZEBETA) 5 MG tablet Take 1.5 tablets (7.5 mg total) by mouth daily. 135 tablet 3   Budeson-Glycopyrrol-Formoterol (BREZTRI AEROSPHERE) 160-9-4.8 MCG/ACT AERO Inhale 2 puffs into the lungs in the morning and at bedtime. 10.7 g 11   glimepiride (AMARYL) 2 MG tablet Take 1 tablet (2 mg total) by mouth every morning. 30 tablet 11   Multiple Vitamin (MULTI-VITAMIN DAILY PO) Take 1 tablet by mouth daily.     potassium chloride (KLOR-CON) 10 MEQ tablet Take 1 tablet (10 mEq total) by mouth daily. Take While taking Lasix/furosemide 30 tablet 2   predniSONE (DELTASONE) 10 MG tablet 2 daily until better, then 1 daily x 1 week then 1/2 daily 100 tablet 2   torsemide 40 MG TABS Take 40 mg by mouth every morning. 30 tablet 2   No current facility-administered medications for this visit.  SURGICAL HISTORY:  Past Surgical History:  Procedure Laterality Date   BRONCHIAL BIOPSY  08/26/2021   Procedure: BRONCHIAL BIOPSIES;  Surgeon: Garner Nash, DO;  Location: [facility];  Service: Pulmonary;;   BRONCHIAL BRUSHINGS  08/26/2021   Procedure: BRONCHIAL BRUSHINGS;  Surgeon: Garner Nash, DO;  Location: [facility];  Service: Pulmonary;;   BRONCHIAL NEEDLE ASPIRATION BIOPSY  08/26/2021   Procedure: BRONCHIAL NEEDLE ASPIRATION BIOPSIES;  Surgeon: Garner Nash, DO;  Location: [facility];  Service: Pulmonary;;   CARDIOVERSION N/A 06/03/2021   Procedure: CARDIOVERSION;  Surgeon: Satira Sark, MD;  Location: [facility];  Service: Cardiovascular;  Laterality: N/A;   COLONOSCOPY N/A 04/29/2013   Procedure: COLONOSCOPY;  Surgeon: Danie Binder, MD;  [facility];  Service: Endoscopy;  Laterality: N/A;  10:30AM   Cyst removed from left elbow     ENDOVENOUS ABLATION SAPHENOUS VEIN W/ LASER Left 01/16/2019   endovenous laser ablation left greater saphenous vein by Ruta Hinds MD   KIDNEY STONE SURGERY     LITHOTRIPSY     PARTIAL COLECTOMY     TEE WITHOUT CARDIOVERSION N/A 06/03/2021   Procedure: TRANSESOPHAGEAL ECHOCARDIOGRAM (TEE);  Surgeon: Satira Sark, MD;  Location: [facility];  Service: Cardiovascular;  Laterality: N/A;   VIDEO BRONCHOSCOPY WITH ENDOBRONCHIAL ULTRASOUND Bilateral 08/26/2021   Procedure: VIDEO BRONCHOSCOPY WITH ENDOBRONCHIAL ULTRASOUND;  Surgeon: Garner Nash, DO;  Location: [facility];  Service: Pulmonary;  Laterality: Bilateral;  will need Guardant [id number]    REVIEW OF SYSTEMS:  Constitutional: positive for fatigue Eyes: negative Ears, nose, mouth, throat, and face: negative Respiratory: positive for dyspnea on exertion Cardiovascular: negative Gastrointestinal: negative Genitourinary:negative Integument/breast: negative Hematologic/lymphatic: negative Musculoskeletal:negative Neurological: negative Behavioral/Psych: negative Endocrine: negative Allergic/Immunologic: negative   PHYSICAL EXAMINATION: General appearance: alert, cooperative, fatigued, and no distress Head: Normocephalic, without obvious abnormality, atraumatic Neck: no adenopathy, no JVD, supple, symmetrical, trachea midline, and thyroid not enlarged, symmetric, no tenderness/mass/nodules Lymph nodes: Cervical, supraclavicular, and axillary nodes normal. Resp: clear to auscultation bilaterally Back: symmetric, no curvature. ROM normal. No CVA  tenderness. Cardio: regular rate and rhythm, S1, S2 normal, no murmur, click, rub or gallop GI: soft, non-tender; bowel sounds normal; no masses,  no organomegaly Extremities: extremities normal, atraumatic, no cyanosis or edema Neurologic: Alert and oriented X 3, normal strength and tone. Normal symmetric reflexes. Normal coordination and gait  ECOG PERFORMANCE STATUS: 1 - Symptomatic but completely ambulatory  Blood pressure 120/68, pulse 69, temperature 98.1 F (36.7 C), temperature source Oral, resp. rate 18, weight 277 lb 9 oz (125.9 kg), SpO2 94 %.  LABORATORY DATA: Lab Results  Component Value Date   WBC 12.3 (H) 05/27/2022   HGB 13.8 05/27/2022   HCT 42.1 05/27/2022   MCV 104.2 (H) 05/27/2022   PLT 154 05/27/2022      Chemistry      Component Value Date/Time   NA 143 05/27/2022 1045   K 5.1 05/27/2022 1045   CL 104 05/27/2022 1045   CO2 35 (H) 05/27/2022 1045   BUN 17 05/27/2022 1045   CREATININE 0.96 05/27/2022 1045      Component Value Date/Time   CALCIUM 9.6 05/27/2022 1045   ALKPHOS 87 05/27/2022 1045   AST 19 05/27/2022 1045   ALT 29 05/27/2022 1045   BILITOT 0.5 05/27/2022 1045       RADIOGRAPHIC STUDIES: CT Chest W Contrast  Result Date: 05/29/2022 CLINICAL DATA:  Non-small-cell lung cancer stage II, squamous cell left lung. Assess treatment  response. Chemotherapy and radiation EXAM: CT CHEST WITH CONTRAST TECHNIQUE: Multidetector CT imaging of the chest was performed during intravenous contrast administration. RADIATION DOSE REDUCTION: This exam was performed according to the departmental dose-optimization program which includes automated exposure control, adjustment of the mA and/or kV according to patient size and/or use of iterative reconstruction technique. CONTRAST:  75 ml OMNIPAQUE IOHEXOL 300 MG/ML  SOLN COMPARISON:  02/21/2022 and older FINDINGS: Cardiovascular: No significant pericardial effusion. Normal caliber thoracic aorta with some scattered  vascular calcifications. Prominent calcifications elsewhere including along the coronary arteries. Heart is nonenlarged. Mediastinum/Nodes: No specific abnormal lymph node enlargement present in the axillary region, hilum on the right. Only a few small less than 1 cm in size nodes are seen in the right hilum, not pathologic by size criteria and are unchanged from previous. A few small mediastinal nodes are seen. Example measured on the prior left-sided mediastinum at 9 mm in short axis stable on image 44 when adjusting for technique. Mild soft tissue fullness in the left hilum is seen with some associated bronchial wall thickening towards the lingula and left upper lobe. Heterogeneous thyroid gland which is small. Normal caliber thoracic esophagus. Lungs/Pleura: Centrilobular emphysematous changes are identified. There is some linear opacity along the lung bases likely scar or atelectasis. No right-sided effusion or pneumothorax. There is some increasing atelectatic changes in the right lower lobe. There is a tiny left effusion, decreased from study of 02/21/2022 patchy opacity in the left lower lobe is improved. There continues to be interstitial septal thickening involving the left upper lobe with bandlike thickening and nodularity extending from left hilum towards the lingula with some retraction. The lingular masslike area was measured on the prior at 3.1 x 1.5 cm on the prior examination. Today this area measures proximally 2.9 by 1.5 cm on series 5, image 65 and is less cavitary. There is adjacent nodular area of infiltrate just anterior to this area which is new. There is tiny 3 mm nodule left lower lobe on series 5 image 68. Five mm focus more caudal as well on image 78. These areas were less well defined on the prior. Few other tiny nodules elsewhere in the left lower lobe. Upper Abdomen: Development of a right adrenal nodule. On image 149 of series 2 this measures 3.2 x 2.7 cm and worrisome for a adrenal  metastasis. Left adrenal gland is preserved. Recommended further evaluation. There is also nonobstructing lower pole right-sided renal stone measuring 5 mm, unchanged. Musculoskeletal: Scattered degenerative changes along the spine. Old healed left-sided lateral rib fractures are again noted. Right-sided rib fractures are also seen. Overall if there is concern of osseous metastatic disease, bone scan can be performed as clinically directed. IMPRESSION: Developing right adrenal nodule worrisome for a metastatic deposit. Decreasing left effusion with some residual. The nodular area described in lingula on the prior examination appears slightly smaller today is less cavitary. There is some increasing patchy opacity seen anterior to the mass in the lingula as well as some developing atelectasis in the right lower lobe. Several tiny sub 6 mm left lower lobe lung nodules. Recommend continued surveillance. A call report by the radiologist assistant team will be made when the office opens as the office as the closed today. Electronically Signed   By: Jill Side M.D.   On: 05/29/2022 11:22    ASSESSMENT AND PLAN: This is a very pleasant 67 years old white male with  stage IIIb (T3, N2, M0) non-small cell lung cancer, squamous  cell carcinoma presented with large left upper lobe lung mass in addition to mediastinal lymphadenopathy diagnosed in April 2023. The patient has a history of colon cancer treated at [facility] in 2014 status post surgical resection followed by adjuvant systemic chemotherapy. The patient underwent a course of concurrent chemoradiation with weekly carboplatin for AUC of 2 and paclitaxel 45 Mg/M2 status post 7 cycle.  Last cycle was given on 10/26/2021. The patient tolerated the previous course of his treatment fairly well except for the mild odynophagia which significantly improved.  He continues to have baseline shortness of breath secondary to his COPD and radiation. The patient  underwent treatment with immunotherapy with Imfinzi 1500 Mg IV every 4 weeks status post 4 cycles.  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 Is currently on observation. He had repeat CT scan of the chest performed recently.  I personally and independently reviewed the scan images and discussed the result and showed the images to the patient and his wife today. His scan showed no concerning disease progression in the chest but there was new right adrenal nodule worrisome for metastatic deposit. I recommended for the patient to have a PET scan performed for further evaluation of this lesion and to rule out disease metastasis. I will see him back for follow-up visit in 3 weeks for evaluation and discussion of the PET scan results and also further recommendation regarding treatment of his condition. The patient was advised to call immediately if he has any other concerning symptoms in the interval. The patient voices understanding of current disease status and treatment options and is in agreement with the current care plan.  All questions were answered. The patient knows to call the clinic with any problems, questions or concerns. We can certainly see the patient much sooner if necessary.  The total time spent in the appointment was 30 minutes.  Disclaimer: This note was dictated with voice recognition software. Similar sounding words can inadvertently be transcribed and may not be corrected upon review.

## 2022-06-06 ENCOUNTER — Telehealth: Payer: Self-pay | Admitting: Internal Medicine

## 2022-06-06 NOTE — Telephone Encounter (Signed)
Called patient regarding upcoming January appointments, patient is notified. 

## 2022-06-16 ENCOUNTER — Encounter (HOSPITAL_COMMUNITY)
Admission: RE | Admit: 2022-06-16 | Discharge: 2022-06-16 | Disposition: A | Discharge status: Home / Self Care | Payer: Medicare HMO | Source: Ambulatory Visit | Attending: Internal Medicine | Admitting: Internal Medicine

## 2022-06-16 DIAGNOSIS — E279 Disorder of adrenal gland, unspecified: Secondary | ICD-10-CM | POA: Diagnosis not present

## 2022-06-16 DIAGNOSIS — I2510 Atherosclerotic heart disease of native coronary artery without angina pectoris: Secondary | ICD-10-CM | POA: Diagnosis not present

## 2022-06-16 DIAGNOSIS — J984 Other disorders of lung: Secondary | ICD-10-CM | POA: Diagnosis not present

## 2022-06-16 DIAGNOSIS — C3490 Malignant neoplasm of unspecified part of unspecified bronchus or lung: Secondary | ICD-10-CM

## 2022-06-16 MED ORDER — FLUDEOXYGLUCOSE F - 18 (FDG) INJECTION
15.0600 | Freq: Once | INTRAVENOUS | Status: AC | PRN
  Administered 2022-06-16: 15.06 via INTRAVENOUS

## 2022-06-21 ENCOUNTER — Other Ambulatory Visit: Payer: Self-pay

## 2022-06-21 ENCOUNTER — Inpatient Hospital Stay: Payer: Medicare HMO | Admitting: Internal Medicine

## 2022-06-21 VITALS — BP 128/69 | HR 48 | Temp 97.7°F | Resp 20 | Ht 73.0 in | Wt 278.8 lb

## 2022-06-21 DIAGNOSIS — C3490 Malignant neoplasm of unspecified part of unspecified bronchus or lung: Secondary | ICD-10-CM

## 2022-06-21 NOTE — Progress Notes (Signed)
[place] [phone]   [phone]  PROGRESS NOTE FOR TELEMEDICINE VISITS  Cameron Samples, PA-C [address] [place] [address]  I connected withNAME@ on 06/21/22 at 11:15 AM EST by video enabled telemedicine visit and verified that I am speaking with the correct person using two identifiers.   I discussed the limitations, risks, security and privacy concerns of performing an evaluation and management service by telemedicine and the availability of in-person appointments. I also discussed with the patient that there may be a patient responsible charge related to this service. The patient expressed understanding and agreed to proceed.  Other persons participating in the visit and their role in the encounter:  wife  Patient's location:  [place] exam room Provider's location: [place] office  DIAGNOSIS:  Stage IIIb (T3, N2, M0) non-small cell lung cancer, squamous cell carcinoma presented with large left upper lobe lung mass in addition to mediastinal lymphadenopathy diagnosed in April 2023. The patient has a history of colon cancer treated at [facility] in 2014 status post surgical resection followed by adjuvant systemic chemotherapy.   PRIOR THERAPY:  1) Concurrent chemoradiation with weekly carboplatin for AUC of 2 and paclitaxel 45 Mg/M2.  Status post 7 cycle.  Last cycle was given on 10/26/2021 with partial response. 2) Consolidation treatment with immunotherapy with Imfinzi 1500 Mg IV every 4 weeks.  First dose December 01, 2021.  Status post 4 cycles.    CURRENT THERAPY: Observation.  INTERVAL HISTORY: [name] 67 y.o. male returns to the clinic today for follow-up visit accompanied by his wife.  The patient is feeling fine today with no concerning complaints except for the baseline shortness of breath and he is currently on home oxygen.  He denied having any current chest pain but  has mild cough with no hemoptysis.  He has no nausea, vomiting, diarrhea or constipation.  He has no recent weight loss or night sweats.  He has no nausea, vomiting, diarrhea or constipation.  He was found on previous CT scan of the chest to have suspicious right adrenal gland metastasis.  I ordered a PET scan which was performed recently and the patient is here for evaluation and discussion of his PET scan results and recommendation regarding his condition.  MEDICAL HISTORY: Past Medical History:  Diagnosis Date   Atrial flutter ([place])    s/p DCCV 06/03/21   CHF (congestive heart failure) ([place])    Colon cancer ([place]) 04/22/2013   Hypertension    Lung cancer ([place]) 08/26/2021   Renal disorder    Kidney stones    ALLERGIES:  is allergic to paclitaxel and penicillins.  MEDICATIONS:  Current Outpatient Medications  Medication Sig Dispense Refill   acetaminophen (TYLENOL) 325 MG tablet Take 2 tablets (650 mg total) by mouth every 6 (six) hours as needed for mild pain (or Fever >/= 101). 12 tablet 0   apixaban (ELIQUIS) 5 MG TABS tablet Take 1 tablet (5 mg total) by mouth 2 (two) times daily. 60 tablet 2   atorvastatin (LIPITOR) 20 MG tablet Take 1 tablet (20 mg total) by mouth every evening. 30 tablet 3   bisoprolol (ZEBETA) 5 MG tablet Take 1.5 tablets (7.5 mg total) by mouth daily. 135 tablet 3   Budeson-Glycopyrrol-Formoterol (BREZTRI AEROSPHERE) 160-9-4.8 MCG/ACT AERO Inhale 2 puffs into the lungs in the morning and at bedtime. 10.7 g 11   glimepiride (AMARYL) 2 MG tablet Take 1 tablet (2 mg total) by  mouth every morning. 30 tablet 11   Multiple Vitamin (MULTI-VITAMIN DAILY PO) Take 1 tablet by mouth daily.     potassium chloride (KLOR-CON) 10 MEQ tablet Take 1 tablet (10 mEq total) by mouth daily. Take While taking Lasix/furosemide 30 tablet 2   predniSONE (DELTASONE) 10 MG tablet 2 daily until better, then 1 daily x 1 week then 1/2 daily 100 tablet 2   torsemide 40 MG TABS Take 40 mg by  mouth every morning. 30 tablet 2   No current facility-administered medications for this visit.    SURGICAL HISTORY:  Past Surgical History:  Procedure Laterality Date   BRONCHIAL BIOPSY  08/26/2021   Procedure: BRONCHIAL BIOPSIES;  Surgeon: Garner Nash, DO;  Location: [facility];  Service: Pulmonary;;   BRONCHIAL BRUSHINGS  08/26/2021   Procedure: BRONCHIAL BRUSHINGS;  Surgeon: Garner Nash, DO;  Location: [facility];  Service: Pulmonary;;   BRONCHIAL NEEDLE ASPIRATION BIOPSY  08/26/2021   Procedure: BRONCHIAL NEEDLE ASPIRATION BIOPSIES;  Surgeon: Garner Nash, DO;  Location: [facility];  Service: Pulmonary;;   CARDIOVERSION N/A 06/03/2021   Procedure: CARDIOVERSION;  Surgeon: Satira Sark, MD;  Location: [facility];  Service: Cardiovascular;  Laterality: N/A;   COLONOSCOPY N/A 04/29/2013   Procedure: COLONOSCOPY;  Surgeon: Danie Binder, MD;  Location[facility];  Service: Endoscopy;  Laterality: N/A;  10:30AM   Cyst removed from left elbow     ENDOVENOUS ABLATION SAPHENOUS VEIN W/ LASER Left 01/16/2019   endovenous laser ablation left greater saphenous vein by Ruta Hinds MD   KIDNEY STONE SURGERY     LITHOTRIPSY     PARTIAL COLECTOMY     TEE WITHOUT CARDIOVERSION N/A 06/03/2021   Procedure: TRANSESOPHAGEAL ECHOCARDIOGRAM (TEE);  Surgeon: Satira Sark, MD;  Location: [facility];  Service: Cardiovascular;  Laterality: N/A;   VIDEO BRONCHOSCOPY WITH ENDOBRONCHIAL ULTRASOUND Bilateral 08/26/2021   Procedure: VIDEO BRONCHOSCOPY WITH ENDOBRONCHIAL ULTRASOUND;  Surgeon: Garner Nash, DO;  Location: [facility];  Service: Pulmonary;  Laterality: Bilateral;  will need Guardant [id number]    REVIEW OF SYSTEMS:  Constitutional: positive for fatigue Eyes: negative Ears, nose, mouth, throat, and face: negative Respiratory: positive for cough and dyspnea on exertion Cardiovascular: negative Gastrointestinal: negative Genitourinary:negative Integument/breast:  negative Hematologic/lymphatic: negative Musculoskeletal:negative Neurological: negative Behavioral/Psych: negative Endocrine: negative Allergic/Immunologic: negative   ECOG PERFORMANCE STATUS: 1 - Symptomatic but completely ambulatory  Blood pressure 128/69, pulse (!) 48, temperature 97.7 F (36.5 C), temperature source Temporal, resp. rate 20, height 6\' 1"  (1.854 m), weight 278 lb 12.8 oz (126.5 kg), SpO2 94 %.  LABORATORY DATA: Lab Results  Component Value Date   WBC 12.3 (H) 05/27/2022   HGB 13.8 05/27/2022   HCT 42.1 05/27/2022   MCV 104.2 (H) 05/27/2022   PLT 154 05/27/2022      Chemistry      Component Value Date/Time   NA 143 05/27/2022 1045   K 5.1 05/27/2022 1045   CL 104 05/27/2022 1045   CO2 35 (H) 05/27/2022 1045   BUN 17 05/27/2022 1045   CREATININE 0.96 05/27/2022 1045      Component Value Date/Time   CALCIUM 9.6 05/27/2022 1045   ALKPHOS 87 05/27/2022 1045   AST 19 05/27/2022 1045   ALT 29 05/27/2022 1045   BILITOT 0.5 05/27/2022 1045       RADIOGRAPHIC STUDIES: NM PET Image Restage (PS) Skull Base to Thigh (F-18 FDG)  Result Date: 06/17/2022 CLINICAL DATA:  Subsequent treatment strategy for non-small  cell lung cancer. EXAM: NUCLEAR MEDICINE PET SKULL BASE TO THIGH TECHNIQUE: 15.06 mCi F-18 FDG was injected intravenously. Full-ring PET imaging was performed from the skull base to thigh after the radiotracer. CT data was obtained and used for attenuation correction and anatomic localization. Fasting blood glucose: 78 mg/dl COMPARISON:  Multiple prior imaging studies. The most recent PET-CT is 02/21/2022. Recent chest CT 05/27/2022. FINDINGS: Mediastinal blood pool activity: SUV max 1.81 Liver activity: SUV max NA NECK: No hypermetabolic lymph nodes in the neck. Incidental CT findings: None. CHEST: Stable treatment changes in the left lung but no areas of hypermetabolism to suggest recurrent tumor. No enlarged or hypermetabolic mediastinal lymph nodes. No  hypermetabolic chest wall mass, supraclavicular or axillary adenopathy. No new pulmonary nodules. Stable small scattered pulmonary nodules. Stable underlying lung disease. Incidental CT findings: Stable age advanced aortic and coronary artery calcifications. ABDOMEN/PELVIS: Enlarging right adrenal gland lesion measures approximately 3.9 x 2.8 cm. It demonstrates marked hypermetabolism with SUV max of 27.22 and is likely a metastatic lesion. The left adrenal gland is normal. No findings for hepatic metastatic disease. No enlarged or hypermetabolic abdominal/pelvic lymph nodes. Incidental CT findings: Stable age advanced vascular disease. Right renal calculi are noted. SKELETON: No findings suspicious for osseous metastatic disease. Areas of moderate muscular uptake likely due to muscular activity. Remote healed rib fractures are noted. Incidental CT findings: None. IMPRESSION: 1. Stable treatment changes in the left lung but no findings for local recurrent tumor or metastatic disease involving the chest. 2. Enlarging right adrenal gland lesion with marked hypermetabolism consistent with a metastatic lesion. 3. No findings for hepatic metastatic disease or osseous metastatic disease. 4. Stable age advanced vascular disease. Aortic Atherosclerosis (ICD10-I70.0). Electronically Signed   By: Marijo Sanes M.D.   On: 06/17/2022 10:59   CT Chest W Contrast  Result Date: 05/29/2022 CLINICAL DATA:  Non-small-cell lung cancer stage II, squamous cell left lung. Assess treatment response. Chemotherapy and radiation EXAM: CT CHEST WITH CONTRAST TECHNIQUE: Multidetector CT imaging of the chest was performed during intravenous contrast administration. RADIATION DOSE REDUCTION: This exam was performed according to the departmental dose-optimization program which includes automated exposure control, adjustment of the mA and/or kV according to patient size and/or use of iterative reconstruction technique. CONTRAST:  75 ml  OMNIPAQUE IOHEXOL 300 MG/ML  SOLN COMPARISON:  02/21/2022 and older FINDINGS: Cardiovascular: No significant pericardial effusion. Normal caliber thoracic aorta with some scattered vascular calcifications. Prominent calcifications elsewhere including along the coronary arteries. Heart is nonenlarged. Mediastinum/Nodes: No specific abnormal lymph node enlargement present in the axillary region, hilum on the right. Only a few small less than 1 cm in size nodes are seen in the right hilum, not pathologic by size criteria and are unchanged from previous. A few small mediastinal nodes are seen. Example measured on the prior left-sided mediastinum at 9 mm in short axis stable on image 44 when adjusting for technique. Mild soft tissue fullness in the left hilum is seen with some associated bronchial wall thickening towards the lingula and left upper lobe. Heterogeneous thyroid gland which is small. Normal caliber thoracic esophagus. Lungs/Pleura: Centrilobular emphysematous changes are identified. There is some linear opacity along the lung bases likely scar or atelectasis. No right-sided effusion or pneumothorax. There is some increasing atelectatic changes in the right lower lobe. There is a tiny left effusion, decreased from study of 02/21/2022 patchy opacity in the left lower lobe is improved. There continues to be interstitial septal thickening involving the left upper lobe with  bandlike thickening and nodularity extending from left hilum towards the lingula with some retraction. The lingular masslike area was measured on the prior at 3.1 x 1.5 cm on the prior examination. Today this area measures proximally 2.9 by 1.5 cm on series 5, image 65 and is less cavitary. There is adjacent nodular area of infiltrate just anterior to this area which is new. There is tiny 3 mm nodule left lower lobe on series 5 image 68. Five mm focus more caudal as well on image 78. These areas were less well defined on the prior. Few other  tiny nodules elsewhere in the left lower lobe. Upper Abdomen: Development of a right adrenal nodule. On image 149 of series 2 this measures 3.2 x 2.7 cm and worrisome for a adrenal metastasis. Left adrenal gland is preserved. Recommended further evaluation. There is also nonobstructing lower pole right-sided renal stone measuring 5 mm, unchanged. Musculoskeletal: Scattered degenerative changes along the spine. Old healed left-sided lateral rib fractures are again noted. Right-sided rib fractures are also seen. Overall if there is concern of osseous metastatic disease, bone scan can be performed as clinically directed. IMPRESSION: Developing right adrenal nodule worrisome for a metastatic deposit. Decreasing left effusion with some residual. The nodular area described in lingula on the prior examination appears slightly smaller today is less cavitary. There is some increasing patchy opacity seen anterior to the mass in the lingula as well as some developing atelectasis in the right lower lobe. Several tiny sub 6 mm left lower lobe lung nodules. Recommend continued surveillance. A call report by the radiologist assistant team will be made when the office opens as the office as the closed today. Electronically Signed   By: Jill Side M.D.   On: 05/29/2022 11:22    ASSESSMENT AND PLAN:  This is a very pleasant 67 years old white male with  stage IIIb (T3, N2, M0) non-small cell lung cancer, squamous cell carcinoma presented with large left upper lobe lung mass in addition to mediastinal lymphadenopathy diagnosed in April 2023. The patient has a history of colon cancer treated at [facility] in 2014 status post surgical resection followed by adjuvant systemic chemotherapy. The patient underwent a course of concurrent chemoradiation with weekly carboplatin for AUC of 2 and paclitaxel 45 Mg/M2 status post 7 cycle.  Last cycle was given on 10/26/2021. The patient tolerated the previous course of his  treatment fairly well except for the mild odynophagia which significantly improved.  He continues to have baseline shortness of breath secondary to his COPD and radiation. The patient underwent treatment with immunotherapy with Imfinzi 1500 Mg IV every 4 weeks status post 4 cycles.  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 Is currently on observation. He had repeat CT scan of the chest performed recently.  I personally and independently reviewed the scan images and discussed the result and showed the images to the patient and his wife today. His scan showed no concerning disease progression in the chest but there was new right adrenal nodule worrisome for metastatic deposit. The patient had a PET scan performed recently.  I personally and independently reviewed the PET scan images and discussed the result and showed the images to the patient today. His scan showed developing right adrenal nodule worrisome for metastatic deposit but there is no other concerning finding for disease progression in the lung or other metastatic disease. I had a  lengthy discussion with the patient today about his condition and treatment options.  With his pulmonary status, it will be hard to consider The patient for biopsy of the right adrenal gland.  I recommended for the patient to see radiation oncology for discussion of palliative radiotherapy to this lesion. The patient and his wife are in agreement with the current plan. I will see him back for follow-up visit in 3 months for evaluation and repeat CT scan of the chest, abdomen pelvis for restaging of his disease. The patient was advised to call immediately if he has any other concerning symptoms in the interval. I discussed the assessment and treatment plan with the patient. The patient was provided an opportunity to ask questions and all were  answered. The patient agreed with the plan and demonstrated an understanding of the instructions.   The patient was advised to call back or seek an in-person evaluation if the symptoms worsen or if the condition fails to improve as anticipated.  I provided 30 minutes of face-to-face video visit time during this encounter, and > 50% was spent counseling as documented under my assessment & plan.  Eilleen Kempf, MD 06/21/2022 11:08 AM  Disclaimer: This note was dictated with voice recognition software. Similar sounding words can inadvertently be transcribed and may not be corrected upon review.

## 2022-06-22 ENCOUNTER — Encounter: Payer: Self-pay | Admitting: Radiation Oncology

## 2022-06-22 NOTE — Progress Notes (Signed)
Nursing interview for a 67 y.o. male w/ Stage IIIb (T3, N2, M0) non-small cell lung cancer, squamous cell carcinoma presented with large left upper lobe lung mass in addition to mediastinal lymphadenopathy.   I verified patient's identity and began nursing interview.   Patient reports occasional SOB (hx of COPD), managed w/ 3-4 liters of non-continuous oxygen. No other issues reported at this time.  Meaningful use complete.  This concludes the interview.   Leandra Kern, LPN

## 2022-06-23 ENCOUNTER — Ambulatory Visit
Admission: RE | Admit: 2022-06-23 | Discharge: 2022-06-23 | Disposition: A | Discharge status: Home / Self Care | Payer: Medicare HMO | Source: Ambulatory Visit | Attending: Radiation Oncology | Admitting: Radiation Oncology

## 2022-06-23 ENCOUNTER — Other Ambulatory Visit: Payer: Self-pay

## 2022-06-23 VITALS — BP 136/88 | HR 102 | Temp 98.2°F | Resp 20 | Ht 73.0 in | Wt 276.0 lb

## 2022-06-23 DIAGNOSIS — C7971 Secondary malignant neoplasm of right adrenal gland: Secondary | ICD-10-CM

## 2022-06-23 DIAGNOSIS — R69 Illness, unspecified: Secondary | ICD-10-CM | POA: Diagnosis not present

## 2022-06-23 DIAGNOSIS — C3492 Malignant neoplasm of unspecified part of left bronchus or lung: Secondary | ICD-10-CM

## 2022-06-23 DIAGNOSIS — Z79630 Long term (current) use of alkylating agent: Secondary | ICD-10-CM | POA: Insufficient documentation

## 2022-06-23 DIAGNOSIS — C3412 Malignant neoplasm of upper lobe, left bronchus or lung: Secondary | ICD-10-CM | POA: Diagnosis not present

## 2022-06-23 DIAGNOSIS — E119 Type 2 diabetes mellitus without complications: Secondary | ICD-10-CM | POA: Diagnosis not present

## 2022-06-23 DIAGNOSIS — Z79633 Long term (current) use of mitotic inhibitor: Secondary | ICD-10-CM | POA: Diagnosis not present

## 2022-06-23 DIAGNOSIS — C189 Malignant neoplasm of colon, unspecified: Secondary | ICD-10-CM

## 2022-06-23 DIAGNOSIS — C7970 Secondary malignant neoplasm of unspecified adrenal gland: Secondary | ICD-10-CM | POA: Insufficient documentation

## 2022-06-23 DIAGNOSIS — F17210 Nicotine dependence, cigarettes, uncomplicated: Secondary | ICD-10-CM | POA: Diagnosis not present

## 2022-06-23 LAB — CBC WITH DIFFERENTIAL (CANCER CENTER ONLY)
Abs Immature Granulocytes: 0.12 10*3/uL — ABNORMAL HIGH (ref 0.00–0.07)
Basophils Absolute: 0 10*3/uL (ref 0.0–0.1)
Basophils Relative: 0 %
Eosinophils Absolute: 0.1 10*3/uL (ref 0.0–0.5)
Eosinophils Relative: 0 %
HCT: 43.3 % (ref 39.0–52.0)
Hemoglobin: 14.3 g/dL (ref 13.0–17.0)
Immature Granulocytes: 1 %
Lymphocytes Relative: 4 %
Lymphs Abs: 0.6 10*3/uL — ABNORMAL LOW (ref 0.7–4.0)
MCH: 34 pg (ref 26.0–34.0)
MCHC: 33 g/dL (ref 30.0–36.0)
MCV: 102.9 fL — ABNORMAL HIGH (ref 80.0–100.0)
Monocytes Absolute: 0.7 10*3/uL (ref 0.1–1.0)
Monocytes Relative: 4 %
Neutro Abs: 15.1 10*3/uL — ABNORMAL HIGH (ref 1.7–7.7)
Neutrophils Relative %: 91 %
Platelet Count: 173 10*3/uL (ref 150–400)
RBC: 4.21 MIL/uL — ABNORMAL LOW (ref 4.22–5.81)
RDW: 14.6 % (ref 11.5–15.5)
WBC Count: 16.6 10*3/uL — ABNORMAL HIGH (ref 4.0–10.5)
nRBC: 0 % (ref 0.0–0.2)

## 2022-06-23 LAB — CMP (CANCER CENTER ONLY)
ALT: 49 U/L — ABNORMAL HIGH (ref 0–44)
AST: 31 U/L (ref 15–41)
Albumin: 4 g/dL (ref 3.5–5.0)
Alkaline Phosphatase: 87 U/L (ref 38–126)
Anion gap: 7 (ref 5–15)
BUN: 27 mg/dL — ABNORMAL HIGH (ref 8–23)
CO2: 35 mmol/L — ABNORMAL HIGH (ref 22–32)
Calcium: 9.9 mg/dL (ref 8.9–10.3)
Chloride: 98 mmol/L (ref 98–111)
Creatinine: 0.99 mg/dL (ref 0.61–1.24)
GFR, Estimated: >60 mL/min (ref >60)
Glucose, Bld: 171 mg/dL — ABNORMAL HIGH (ref 70–99)
Potassium: 4.6 mmol/L (ref 3.5–5.1)
Sodium: 140 mmol/L (ref 135–145)
Total Bilirubin: 0.5 mg/dL (ref 0.3–1.2)
Total Protein: 7.5 g/dL (ref 6.5–8.1)

## 2022-06-23 NOTE — Progress Notes (Signed)
Radiation Oncology         [phone] ________________________________  Name: [name]        MRN: [id number]  Date of Service: 06/23/2022 DOB: 07-May-1956  SW:FUXNATF, Hulen Shouts, PA-C  Curt Bears, MD     REFERRING PHYSICIAN: Curt Bears, MD   DIAGNOSIS: The primary encounter diagnosis was Stage III squamous cell carcinoma of left lung ([place]). A diagnosis of Malignant neoplasm metastatic to right adrenal gland [facility]) was also pertinent to this visit.   HISTORY OF PRESENT ILLNESS: [name] is a 67 y.o. male with a history of Stage IIIB, cT3N2M0, NSCLC, squamous cell carcinoma of the LUL.  He was originally diagnosed after a PET scan on 08/12/21 showed a 5.8 cm hypermetabolic lingular mass with an SUV in the 26 range, and hypermetabolic left hilar and medistinal nodes. No metastatic disease was noted. Bronchoscopy on 08/26/21 confirmed a squamous cell carcinoma.  He went on to receive chemoradiation which he completed in June 2023.  He continued single agent nivolumab as consolidative immunotherapy between July and October 2023.  He has since been followed in surveillance.  Recent CT scan on 05/27/2022 showed a developing right adrenal nodule worrisome for metastatic deposit, decreasing left pleural effusion, improvement in the left lingular nodular area, and several tiny sub-6 mm left lower lobe nodules were noted.  A PET scan was repeated on 06/16/2022 and showed stable treatment changes in the left lung without evidence of recurrent disease however marked hypermetabolism with an enlarging right adrenal lesion measuring 3.9 with an SUV of 27.2.  No other metastatic or activity suspicious for new disease was identified otherwise.  He is seen today to consider stereotactic body radiotherapy in hopes of avoiding systemic treatment.  PREVIOUS RADIATION THERAPY:   09/13/2021 through 10/29/2021 Site Technique Total Dose (Gy) Dose per Fx (Gy) Completed Fx Beam Energies  Lung,  Left: Lung_L 3D 60/60 2 30/30 6X  Lung, Left: Lung_L_Bst 3D 6/6 2 3/3 6X     PAST MEDICAL HISTORY:  Past Medical History:  Diagnosis Date   Atrial flutter ([place])    s/p DCCV 06/03/21   CHF (congestive heart failure) ([place])    Colon cancer ([place]) 04/22/2013   Hypertension    Lung cancer ([place]) 08/26/2021   Renal disorder    Kidney stones       PAST SURGICAL HISTORY: Past Surgical History:  Procedure Laterality Date   BRONCHIAL BIOPSY  08/26/2021   Procedure: BRONCHIAL BIOPSIES;  Surgeon: Garner Nash, DO;  Location: [place] [facility];  Service: Pulmonary;;   BRONCHIAL BRUSHINGS  08/26/2021   Procedure: BRONCHIAL BRUSHINGS;  Surgeon: Garner Nash, DO;  Location: [place];  Service: Pulmonary;;   BRONCHIAL NEEDLE ASPIRATION BIOPSY  08/26/2021   Procedure: BRONCHIAL NEEDLE ASPIRATION BIOPSIES;  Surgeon: Garner Nash, DO;  Location: [place] [facility];  Service: Pulmonary;;   CARDIOVERSION N/A 06/03/2021   Procedure: CARDIOVERSION;  Surgeon: Satira Sark, MD;  Location: [facility];  Service: Cardiovascular;  Laterality: N/A;   COLONOSCOPY N/A 04/29/2013   Procedure: COLONOSCOPY;  Surgeon: Danie Binder, MD;  [facility];  Service: Endoscopy;  Laterality: N/A;  10:30AM   Cyst removed from left elbow     ENDOVENOUS ABLATION SAPHENOUS VEIN W/ LASER Left 01/16/2019   endovenous laser ablation left greater saphenous vein by Ruta Hinds MD   KIDNEY STONE SURGERY     LITHOTRIPSY     PARTIAL COLECTOMY     TEE WITHOUT CARDIOVERSION N/A 06/03/2021  Procedure: TRANSESOPHAGEAL ECHOCARDIOGRAM (TEE);  Surgeon: Satira Sark, MD;  Location: [facility];  Service: Cardiovascular;  Laterality: N/A;   VIDEO BRONCHOSCOPY WITH ENDOBRONCHIAL ULTRASOUND Bilateral 08/26/2021   Procedure: VIDEO BRONCHOSCOPY WITH ENDOBRONCHIAL ULTRASOUND;  Surgeon: Garner Nash, DO;  Location: [place];  Service: Pulmonary;  Laterality: Bilateral;  will need Guardant [id number]     FAMILY HISTORY:  Family  History  Problem Relation Age of Onset   Cancer Mother        pancreatic   Colon cancer Neg Hx      SOCIAL HISTORY:  reports that he has been smoking cigarettes. He has a 10.00 pack-year smoking history. He has never used smokeless tobacco. He reports current alcohol use of about 12.0 standard drinks of alcohol per week. He reports that he does not use drugs. The patient is married and lives in [place].    ALLERGIES: Paclitaxel and Penicillins   MEDICATIONS:  Current Outpatient Medications  Medication Sig Dispense Refill   acetaminophen (TYLENOL) 325 MG tablet Take 2 tablets (650 mg total) by mouth every 6 (six) hours as needed for mild pain (or Fever >/= 101). 12 tablet 0   apixaban (ELIQUIS) 5 MG TABS tablet Take 1 tablet (5 mg total) by mouth 2 (two) times daily. 60 tablet 2   atorvastatin (LIPITOR) 20 MG tablet Take 1 tablet (20 mg total) by mouth every evening. 30 tablet 3   bisoprolol (ZEBETA) 5 MG tablet Take 1.5 tablets (7.5 mg total) by mouth daily. 135 tablet 3   Budeson-Glycopyrrol-Formoterol (BREZTRI AEROSPHERE) 160-9-4.8 MCG/ACT AERO Inhale 2 puffs into the lungs in the morning and at bedtime. 10.7 g 11   glimepiride (AMARYL) 2 MG tablet Take 1 tablet (2 mg total) by mouth every morning. 30 tablet 11   Multiple Vitamin (MULTI-VITAMIN DAILY PO) Take 1 tablet by mouth daily.     potassium chloride (KLOR-CON) 10 MEQ tablet Take 1 tablet (10 mEq total) by mouth daily. Take While taking Lasix/furosemide 30 tablet 2   predniSONE (DELTASONE) 10 MG tablet 2 daily until better, then 1 daily x 1 week then 1/2 daily 100 tablet 2   torsemide 40 MG TABS Take 40 mg by mouth every morning. 30 tablet 2   No current facility-administered medications for this encounter.     REVIEW OF SYSTEMS: On review of systems, that he is doing okay. He has shortness of breath with exertion as well since developing pneumonitis. He is not using continuous O2 at 2-3L [place]. He denies any abdominal pain,  nausea, or vomiting. No other complaints are verbalized.      PHYSICAL EXAM:  Wt Readings from Last 3 Encounters:  06/21/22 278 lb 12.8 oz (126.5 kg)  05/31/22 277 lb 9 oz (125.9 kg)  05/26/22 278 lb 3.2 oz (126.2 kg)   Temp Readings from Last 3 Encounters:  06/21/22 97.7 F (36.5 C) (Temporal)  05/31/22 98.1 F (36.7 C) (Oral)  05/26/22 97.6 F (36.4 C)   BP Readings from Last 3 Encounters:  06/21/22 128/69  05/31/22 120/68  05/26/22 (!) 144/90   Pulse Readings from Last 3 Encounters:  06/21/22 (!) 48  05/31/22 69  05/26/22 92   Pain Assessment Pain Score: 0-No pain/10  In general this is a chronically ill appearing caucasian male in no acute distress. He's alert and oriented x4 and appropriate throughout the examination. Cardiopulmonary assessment is negative for acute distress and he exhibits normal effort.     ECOG = 1  0 -  Asymptomatic (Fully active, able to carry on all predisease activities without restriction)  1 - Symptomatic but completely ambulatory (Restricted in physically strenuous activity but ambulatory and able to carry out work of a light or sedentary nature. For example, light housework, office work)  2 - Symptomatic, <50% in bed during the day (Ambulatory and capable of all self care but unable to carry out any work activities. Up and about more than 50% of waking hours)  3 - Symptomatic, >50% in bed, but not bedbound (Capable of only limited self-care, confined to bed or chair 50% or more of waking hours)  4 - Bedbound (Completely disabled. Cannot carry on any self-care. Totally confined to bed or chair)  5 - Death   Eustace Pen MM, Creech RH, Tormey DC, et al. [phone]). "Toxicity and response criteria of the [facility]". [place] Oncol. 5 (6): 649-55    LABORATORY DATA:  Lab Results  Component Value Date   WBC 12.3 (H) 05/27/2022   HGB 13.8 05/27/2022   HCT 42.1 05/27/2022   MCV 104.2 (H) 05/27/2022   PLT 154  05/27/2022   Lab Results  Component Value Date   NA 143 05/27/2022   K 5.1 05/27/2022   CL 104 05/27/2022   CO2 35 (H) 05/27/2022   Lab Results  Component Value Date   ALT 29 05/27/2022   AST 19 05/27/2022   ALKPHOS 87 05/27/2022   BILITOT 0.5 05/27/2022      RADIOGRAPHY: NM PET Image Restage (PS) Skull Base to Thigh (F-18 FDG)  Result Date: 06/17/2022 CLINICAL DATA:  Subsequent treatment strategy for non-small cell lung cancer. EXAM: NUCLEAR MEDICINE PET SKULL BASE TO THIGH TECHNIQUE: 15.06 mCi F-18 FDG was injected intravenously. Full-ring PET imaging was performed from the skull base to thigh after the radiotracer. CT data was obtained and used for attenuation correction and anatomic localization. Fasting blood glucose: 78 mg/dl COMPARISON:  Multiple prior imaging studies. The most recent PET-CT is 02/21/2022. Recent chest CT 05/27/2022. FINDINGS: Mediastinal blood pool activity: SUV max 1.81 Liver activity: SUV max NA NECK: No hypermetabolic lymph nodes in the neck. Incidental CT findings: None. CHEST: Stable treatment changes in the left lung but no areas of hypermetabolism to suggest recurrent tumor. No enlarged or hypermetabolic mediastinal lymph nodes. No hypermetabolic chest wall mass, supraclavicular or axillary adenopathy. No new pulmonary nodules. Stable small scattered pulmonary nodules. Stable underlying lung disease. Incidental CT findings: Stable age advanced aortic and coronary artery calcifications. ABDOMEN/PELVIS: Enlarging right adrenal gland lesion measures approximately 3.9 x 2.8 cm. It demonstrates marked hypermetabolism with SUV max of 27.22 and is likely a metastatic lesion. The left adrenal gland is normal. No findings for hepatic metastatic disease. No enlarged or hypermetabolic abdominal/pelvic lymph nodes. Incidental CT findings: Stable age advanced vascular disease. Right renal calculi are noted. SKELETON: No findings suspicious for osseous metastatic disease. Areas  of moderate muscular uptake likely due to muscular activity. Remote healed rib fractures are noted. Incidental CT findings: None. IMPRESSION: 1. Stable treatment changes in the left lung but no findings for local recurrent tumor or metastatic disease involving the chest. 2. Enlarging right adrenal gland lesion with marked hypermetabolism consistent with a metastatic lesion. 3. No findings for hepatic metastatic disease or osseous metastatic disease. 4. Stable age advanced vascular disease. Aortic Atherosclerosis (ICD10-I70.0). Electronically Signed   By: Marijo Sanes M.D.   On: 06/17/2022 10:59   CT Chest W Contrast  Result Date: 05/29/2022 CLINICAL DATA:  Non-small-cell lung  cancer stage II, squamous cell left lung. Assess treatment response. Chemotherapy and radiation EXAM: CT CHEST WITH CONTRAST TECHNIQUE: Multidetector CT imaging of the chest was performed during intravenous contrast administration. RADIATION DOSE REDUCTION: This exam was performed according to the departmental dose-optimization program which includes automated exposure control, adjustment of the mA and/or kV according to patient size and/or use of iterative reconstruction technique. CONTRAST:  75 ml OMNIPAQUE IOHEXOL 300 MG/ML  SOLN COMPARISON:  02/21/2022 and older FINDINGS: Cardiovascular: No significant pericardial effusion. Normal caliber thoracic aorta with some scattered vascular calcifications. Prominent calcifications elsewhere including along the coronary arteries. Heart is nonenlarged. Mediastinum/Nodes: No specific abnormal lymph node enlargement present in the axillary region, hilum on the right. Only a few small less than 1 cm in size nodes are seen in the right hilum, not pathologic by size criteria and are unchanged from previous. A few small mediastinal nodes are seen. Example measured on the prior left-sided mediastinum at 9 mm in short axis stable on image 44 when adjusting for technique. Mild soft tissue fullness in the  left hilum is seen with some associated bronchial wall thickening towards the lingula and left upper lobe. Heterogeneous thyroid gland which is small. Normal caliber thoracic esophagus. Lungs/Pleura: Centrilobular emphysematous changes are identified. There is some linear opacity along the lung bases likely scar or atelectasis. No right-sided effusion or pneumothorax. There is some increasing atelectatic changes in the right lower lobe. There is a tiny left effusion, decreased from study of 02/21/2022 patchy opacity in the left lower lobe is improved. There continues to be interstitial septal thickening involving the left upper lobe with bandlike thickening and nodularity extending from left hilum towards the lingula with some retraction. The lingular masslike area was measured on the prior at 3.1 x 1.5 cm on the prior examination. Today this area measures proximally 2.9 by 1.5 cm on series 5, image 65 and is less cavitary. There is adjacent nodular area of infiltrate just anterior to this area which is new. There is tiny 3 mm nodule left lower lobe on series 5 image 68. Five mm focus more caudal as well on image 78. These areas were less well defined on the prior. Few other tiny nodules elsewhere in the left lower lobe. Upper Abdomen: Development of a right adrenal nodule. On image 149 of series 2 this measures 3.2 x 2.7 cm and worrisome for a adrenal metastasis. Left adrenal gland is preserved. Recommended further evaluation. There is also nonobstructing lower pole right-sided renal stone measuring 5 mm, unchanged. Musculoskeletal: Scattered degenerative changes along the spine. Old healed left-sided lateral rib fractures are again noted. Right-sided rib fractures are also seen. Overall if there is concern of osseous metastatic disease, bone scan can be performed as clinically directed. IMPRESSION: Developing right adrenal nodule worrisome for a metastatic deposit. Decreasing left effusion with some residual. The  nodular area described in lingula on the prior examination appears slightly smaller today is less cavitary. There is some increasing patchy opacity seen anterior to the mass in the lingula as well as some developing atelectasis in the right lower lobe. Several tiny sub 6 mm left lower lobe lung nodules. Recommend continued surveillance. A call report by the radiologist assistant team will be made when the office opens as the office as the closed today. Electronically Signed   By: Jill Side M.D.   On: 05/29/2022 11:22       IMPRESSION/PLAN: 1. Recurrent metastatic Stage IIIB, cT3N2M0, NSCLC, squamous cell carcinoma of the  LUL with right adrenal gland metastasis. Dr. Lisbeth Renshaw discusses the the rationale for stereotactic body radiotherapy (SBRT) to the adrenal gland on the right side. We discussed the risks, benefits, short, and long term effects of radiotherapy, as well as the curative intent, and the patient is interested in proceeding. Dr. Lisbeth Renshaw discusses the delivery and logistics of radiotherapy and anticipates a course of 5-8 fractions of SBRT radiotherapy. Written consent is obtained and placed in the chart, a copy was provided to the patient. He will return on Tuesday for simulation. We will repeat his lab work since his most recent renal function will be out of date for our department's protocol. 2. 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 He will follow up with cardiology and pulmonology.    In a visit lasting 60 minutes, greater than 50% of the time was spent face to face discussing the patient's condition, in preparation for the discussion, and coordinating the patient's care.   The above documentation reflects my direct findings during this shared patient visit. Please see the separate note by Dr. Lisbeth Renshaw on this date for the remainder of the patient's plan of care.    Carola Rhine, [facility]   **Disclaimer: This note was dictated with voice  recognition software. Similar sounding words can inadvertently be transcribed and this note may contain transcription errors which may not have been corrected upon publication of note.**

## 2022-06-24 DIAGNOSIS — C7971 Secondary malignant neoplasm of right adrenal gland: Secondary | ICD-10-CM | POA: Diagnosis not present

## 2022-06-24 DIAGNOSIS — C3412 Malignant neoplasm of upper lobe, left bronchus or lung: Secondary | ICD-10-CM | POA: Diagnosis not present

## 2022-06-24 DIAGNOSIS — R69 Illness, unspecified: Secondary | ICD-10-CM | POA: Diagnosis not present

## 2022-06-24 LAB — TSH: TSH: 0.626 u[IU]/mL (ref 0.350–4.500)

## 2022-06-28 ENCOUNTER — Other Ambulatory Visit: Payer: Self-pay

## 2022-06-28 ENCOUNTER — Ambulatory Visit
Admission: RE | Admit: 2022-06-28 | Discharge: 2022-06-28 | Disposition: A | Discharge status: Home / Self Care | Payer: Medicare HMO | Source: Ambulatory Visit | Attending: Radiation Oncology | Admitting: Radiation Oncology

## 2022-06-28 ENCOUNTER — Ambulatory Visit: Admission: RE | Admit: 2022-06-28 | Payer: Medicare HMO | Source: Ambulatory Visit | Admitting: Radiation Oncology

## 2022-06-28 VITALS — BP 125/73 | HR 99 | Temp 97.4°F | Resp 22 | Ht 73.0 in | Wt 277.8 lb

## 2022-06-28 DIAGNOSIS — R69 Illness, unspecified: Secondary | ICD-10-CM | POA: Diagnosis not present

## 2022-06-28 DIAGNOSIS — Z79630 Long term (current) use of alkylating agent: Secondary | ICD-10-CM | POA: Diagnosis not present

## 2022-06-28 DIAGNOSIS — C3412 Malignant neoplasm of upper lobe, left bronchus or lung: Secondary | ICD-10-CM | POA: Diagnosis not present

## 2022-06-28 DIAGNOSIS — C7971 Secondary malignant neoplasm of right adrenal gland: Secondary | ICD-10-CM

## 2022-06-28 DIAGNOSIS — E119 Type 2 diabetes mellitus without complications: Secondary | ICD-10-CM | POA: Diagnosis not present

## 2022-06-28 DIAGNOSIS — Z79633 Long term (current) use of mitotic inhibitor: Secondary | ICD-10-CM | POA: Diagnosis not present

## 2022-06-28 MED ORDER — SODIUM CHLORIDE 0.9% FLUSH
10.0000 mL | Freq: Once | INTRAVENOUS | Status: AC
  Administered 2022-06-28: 10 mL via INTRAVENOUS

## 2022-06-28 NOTE — Progress Notes (Signed)
Has armband been applied?  Yes  Does patient have an allergy to IV contrast dye?: No   Has patient ever received premedication for IV contrast dye?: n/a   Does patient take metformin?: No  If patient does take metformin when was the last dose: n/a  Date of lab work: 06/23/2022 BUN: 27 CR: 0.99 eGfr: >60  IV site: Left AC  Has IV site been added to flowsheet?  Yes  BP 125/73   Pulse 99   Temp (!) 97.4 F (36.3 C)   Resp (!) 22   Ht 6\' 1"  (1.854 m)   Wt 277 lb 12.8 oz (126 kg)   SpO2 95%   BMI 36.65 kg/m

## 2022-07-04 DIAGNOSIS — C3412 Malignant neoplasm of upper lobe, left bronchus or lung: Secondary | ICD-10-CM | POA: Diagnosis not present

## 2022-07-04 DIAGNOSIS — R69 Illness, unspecified: Secondary | ICD-10-CM | POA: Diagnosis not present

## 2022-07-04 DIAGNOSIS — C7971 Secondary malignant neoplasm of right adrenal gland: Secondary | ICD-10-CM | POA: Diagnosis not present

## 2022-07-05 DIAGNOSIS — R69 Illness, unspecified: Secondary | ICD-10-CM | POA: Diagnosis not present

## 2022-07-05 DIAGNOSIS — Z79633 Long term (current) use of mitotic inhibitor: Secondary | ICD-10-CM | POA: Diagnosis not present

## 2022-07-05 DIAGNOSIS — C7971 Secondary malignant neoplasm of right adrenal gland: Secondary | ICD-10-CM | POA: Diagnosis not present

## 2022-07-05 DIAGNOSIS — C3412 Malignant neoplasm of upper lobe, left bronchus or lung: Secondary | ICD-10-CM | POA: Diagnosis not present

## 2022-07-05 DIAGNOSIS — E119 Type 2 diabetes mellitus without complications: Secondary | ICD-10-CM | POA: Diagnosis not present

## 2022-07-05 DIAGNOSIS — Z79630 Long term (current) use of alkylating agent: Secondary | ICD-10-CM | POA: Diagnosis not present

## 2022-07-06 ENCOUNTER — Ambulatory Visit: Payer: Medicare HMO | Admitting: Radiation Oncology

## 2022-07-07 ENCOUNTER — Ambulatory Visit: Payer: Medicare HMO | Admitting: Internal Medicine

## 2022-07-07 ENCOUNTER — Ambulatory Visit: Payer: Medicare HMO | Admitting: Radiation Oncology

## 2022-07-07 NOTE — Progress Notes (Deleted)
[name], male    DOB: [birth year],     MRN: [id number]   Brief patient profile:  62  yowm  active smoker /MM  referred to pulmonary clinic in [place]  07/08/2021 by [name]  for copd eval with onset 2018       History of Present Illness  07/08/2021  Pulmonary/ 1st Ramos eval/ [name] / [name]  Chief Complaint  Patient presents with   Consult    Self referral for what patient thought was COPD and it was Afib.  Patient using oxygen at home prn.   Dyspnea:  walking around the yard x 5 min / crosses parking lots/ [employer] pushing cart not using 02 much with exertion Cough: minimal /no am flare or purulent sputum  Sleep: bed flat/ one pillow on 2lpm  SABA use:  has symbicort breztri albuterol listed and not clear how he really uses them 02  2lpm hs and prn  Rec Plan A = Automatic = Always=    Breztri Take 2 puffs first thing in am and then another 2 puffs about 12 hours later.   Work on inhaler technique:  Remember to warm up like a golfer Plan B = Backup (to supplement plan A, not to replace it) Only use your albuterol inhaler as a rescue medication  Plan C = Crisis (instead of Plan B but only if Plan B stops working) - only use your albuterol nebulizer if you first try Plan B and it fails to help > ok to use the nebulizer up to every 4 hours but if start needing it regularly call for immediate appointment Make sure you check your oxygen saturation  AT  your highest level of activity (not after you stop)   to be sure it stays over 90% Cxr 07/08/21 ? Lung mass  >>> CT 07/23/21 Rounded masslike area in the left upper lobe measuring up to 5.6 cm. Appearance is concerning for lung cancer. Associated left hilar adenopathy. Borderline sized mediastinal lymph nodes>>> PET 08/12/21  c/w Stage 3b lung ca > referred for fob/ebus ([name])  Sq cell ca  Rx chemo and RT completed June 9th 2023      11/22/2021  f/u ov/[name]/[name] re: GOLD 3/ 02 dep  maint on breztri  (but out x sev days) and still smoking   Chief Complaint  Patient presents with   Follow-up    Ct done on 6/29. Coughing has improved.   Dyspnea:  still MMRC 1  Cough: better  Sleeping: on side L / flat bed / 2 pillows  SABA use: neither puff nor neb 02: prn rarely uses  Rec Plan A = Automatic = Always=    Breztri (or Symbicort ) 2 puffs first thing in am and the pm doses are optional  Plan B = Backup (to supplement plan A, not to replace it) Only use your albuterol inhaler as a rescue medication  Plan C = Crisis (instead of Plan B but only if Plan B stops working) - only use your albuterol nebulizer if you first try Plan B  Make sure you check your oxygen saturation  AT  your highest level of activity (not after you stop)   to be sure it stays over 90% a Please schedule a follow up visit in 6  months but call sooner if needed       04/12/2022  f/u ov/[name]/[name] re: GOLD 3 copd? Imfimz reaction  maint on breztri  / only  better while on prednisone Chief Complaint  Patient presents with   Follow-up    Breathing is better since last ov.  Wants to discuss prednisone   Dyspnea:  [employer] ok now s 02 / better on prednisone  Cough: mucoid/ no blood, seems worse with L side down at hs  Sleeping: flat bed 2 pillows  SABA use: rarely using  02: 3lpm hs and prn but not typically using when walking  Rec Work on inhaler technique:   - remember how golfers take practice swings Make sure you check your oxygen saturation  AT  your highest level of activity (not after you stop)   to be sure it stays over 90% Prednisone 10 mg x 2 daily with bfast until your breathing is the best its been then 10 mg daily for a week then 5mg  (one half a 10) until return -if losing ground go back to prior dose that worked    05/26/2022  f/u ov/[name]/[name] re: GOLD 3 but hypercarbic and hypoxemic RF  maint on still on 10 mg pred  and breztri 2 bid  Chief Complaint  Patient presents with    Follow-up    Using 3-4LO2 cont    Dyspnea:  still doing [employer] and walking to out building 500 ft level s 02  Cough: min clear  Sleeping: on side flat bed / 2 pillows SABA use: rarely  02: 3lpm hs  and prn  Covid status: has not done latest  Rec Continue breztri 2 every 12 hours  Work on inhaler technique: Make sure you check your oxygen saturation  AT  your highest level of activity (not after you stop)   to be sure it stays over 90%  Prednisone ceiling is 20 mg daily and the floor 5 mg  The key is to stop smoking completely before smoking completely stops you!        07/07/2022  f/u ov/[name]/[name] re: *** maint on ***  No chief complaint on file.   Dyspnea:  *** Cough: *** Sleeping: *** SABA use: *** 02: *** Covid status: *** Lung cancer screening: ***   No obvious day to day or daytime variability or assoc excess/ purulent sputum or mucus plugs or hemoptysis or cp or chest tightness, subjective wheeze or overt sinus or hb symptoms.   *** without nocturnal  or early am exacerbation  of respiratory  c/o's or need for noct saba. Also denies any obvious fluctuation of symptoms with weather or environmental changes or other aggravating or alleviating factors except as outlined above   No unusual exposure hx or h/o childhood pna/ asthma or knowledge of premature birth.  Current Allergies, Complete Past Medical History, Past Surgical History, Family History, and Social History were reviewed in [employer] record.  ROS  The following are not active complaints unless bolded Hoarseness, sore throat, dysphagia, dental problems, itching, sneezing,  nasal congestion or discharge of excess mucus or purulent secretions, ear ache,   fever, chills, sweats, unintended wt loss or wt gain, classically pleuritic or exertional cp,  orthopnea pnd or arm/hand swelling  or leg swelling, presyncope, palpitations, abdominal pain, anorexia, nausea, vomiting,  diarrhea  or change in bowel habits or change in bladder habits, change in stools or change in urine, dysuria, hematuria,  rash, arthralgias, visual complaints, headache, numbness, weakness or ataxia or problems with walking or coordination,  change in mood or  memory.        No outpatient medications  have been marked as taking for the 07/07/22 encounter (Appointment) with Tanda Rockers, MD.         Objective:    Wts  07/07/2022         ***  05/26/2022          278  04/12/2022      269  03/03/2022     279   11/22/2021        266   08/24/21 264 lb 12.8 oz (120.1 kg)  07/08/21 264 lb (119.7 kg)  06/05/21 259 lb 0.7 oz (117.5 kg)      Vital signs reviewed  07/07/2022  - Note at rest 02 sats  ***% on ***   General appearance:    ***    Mild bar***                Assessment

## 2022-07-08 ENCOUNTER — Ambulatory Visit: Payer: Medicare HMO | Admitting: Radiation Oncology

## 2022-07-08 ENCOUNTER — Other Ambulatory Visit: Payer: Self-pay

## 2022-07-08 ENCOUNTER — Encounter (HOSPITAL_COMMUNITY): Payer: Self-pay

## 2022-07-08 ENCOUNTER — Emergency Department (HOSPITAL_COMMUNITY): Payer: Medicare HMO

## 2022-07-08 ENCOUNTER — Inpatient Hospital Stay (HOSPITAL_COMMUNITY)
Admission: EM | Admit: 2022-07-08 | Discharge: 2022-07-22 | DRG: 871 | Disposition: E | Payer: Medicare HMO | Attending: Family Medicine | Admitting: Family Medicine

## 2022-07-08 DIAGNOSIS — R579 Shock, unspecified: Secondary | ICD-10-CM | POA: Diagnosis not present

## 2022-07-08 DIAGNOSIS — J470 Bronchiectasis with acute lower respiratory infection: Secondary | ICD-10-CM | POA: Diagnosis present

## 2022-07-08 DIAGNOSIS — R0689 Other abnormalities of breathing: Secondary | ICD-10-CM | POA: Diagnosis not present

## 2022-07-08 DIAGNOSIS — Z66 Do not resuscitate: Secondary | ICD-10-CM | POA: Diagnosis not present

## 2022-07-08 DIAGNOSIS — R7989 Other specified abnormal findings of blood chemistry: Secondary | ICD-10-CM | POA: Insufficient documentation

## 2022-07-08 DIAGNOSIS — R0902 Hypoxemia: Secondary | ICD-10-CM | POA: Diagnosis not present

## 2022-07-08 DIAGNOSIS — I10 Essential (primary) hypertension: Secondary | ICD-10-CM | POA: Diagnosis not present

## 2022-07-08 DIAGNOSIS — M7989 Other specified soft tissue disorders: Secondary | ICD-10-CM | POA: Diagnosis present

## 2022-07-08 DIAGNOSIS — A419 Sepsis, unspecified organism: Secondary | ICD-10-CM | POA: Insufficient documentation

## 2022-07-08 DIAGNOSIS — Z800 Family history of malignant neoplasm of digestive organs: Secondary | ICD-10-CM

## 2022-07-08 DIAGNOSIS — E875 Hyperkalemia: Secondary | ICD-10-CM | POA: Diagnosis present

## 2022-07-08 DIAGNOSIS — A4189 Other specified sepsis: Secondary | ICD-10-CM | POA: Diagnosis not present

## 2022-07-08 DIAGNOSIS — J441 Chronic obstructive pulmonary disease with (acute) exacerbation: Secondary | ICD-10-CM | POA: Diagnosis present

## 2022-07-08 DIAGNOSIS — I21A1 Myocardial infarction type 2: Secondary | ICD-10-CM | POA: Diagnosis not present

## 2022-07-08 DIAGNOSIS — Z888 Allergy status to other drugs, medicaments and biological substances status: Secondary | ICD-10-CM

## 2022-07-08 DIAGNOSIS — I169 Hypertensive crisis, unspecified: Secondary | ICD-10-CM | POA: Diagnosis present

## 2022-07-08 DIAGNOSIS — J9622 Acute and chronic respiratory failure with hypercapnia: Secondary | ICD-10-CM | POA: Diagnosis present

## 2022-07-08 DIAGNOSIS — F17210 Nicotine dependence, cigarettes, uncomplicated: Secondary | ICD-10-CM | POA: Diagnosis present

## 2022-07-08 DIAGNOSIS — Z1152 Encounter for screening for COVID-19: Secondary | ICD-10-CM

## 2022-07-08 DIAGNOSIS — I4891 Unspecified atrial fibrillation: Secondary | ICD-10-CM | POA: Diagnosis present

## 2022-07-08 DIAGNOSIS — Z6831 Body mass index (BMI) 31.0-31.9, adult: Secondary | ICD-10-CM

## 2022-07-08 DIAGNOSIS — I4510 Unspecified right bundle-branch block: Secondary | ICD-10-CM | POA: Diagnosis present

## 2022-07-08 DIAGNOSIS — I4901 Ventricular fibrillation: Secondary | ICD-10-CM | POA: Diagnosis not present

## 2022-07-08 DIAGNOSIS — B338 Other specified viral diseases: Secondary | ICD-10-CM | POA: Insufficient documentation

## 2022-07-08 DIAGNOSIS — Z9981 Dependence on supplemental oxygen: Secondary | ICD-10-CM

## 2022-07-08 DIAGNOSIS — J9601 Acute respiratory failure with hypoxia: Secondary | ICD-10-CM | POA: Diagnosis not present

## 2022-07-08 DIAGNOSIS — I110 Hypertensive heart disease with heart failure: Secondary | ICD-10-CM | POA: Diagnosis present

## 2022-07-08 DIAGNOSIS — E870 Hyperosmolality and hypernatremia: Secondary | ICD-10-CM | POA: Diagnosis not present

## 2022-07-08 DIAGNOSIS — Z85038 Personal history of other malignant neoplasm of large intestine: Secondary | ICD-10-CM

## 2022-07-08 DIAGNOSIS — I509 Heart failure, unspecified: Secondary | ICD-10-CM

## 2022-07-08 DIAGNOSIS — R6520 Severe sepsis without septic shock: Secondary | ICD-10-CM | POA: Diagnosis present

## 2022-07-08 DIAGNOSIS — C7971 Secondary malignant neoplasm of right adrenal gland: Secondary | ICD-10-CM | POA: Diagnosis present

## 2022-07-08 DIAGNOSIS — J9621 Acute and chronic respiratory failure with hypoxia: Secondary | ICD-10-CM | POA: Diagnosis present

## 2022-07-08 DIAGNOSIS — Z85118 Personal history of other malignant neoplasm of bronchus and lung: Secondary | ICD-10-CM

## 2022-07-08 DIAGNOSIS — J121 Respiratory syncytial virus pneumonia: Secondary | ICD-10-CM | POA: Diagnosis present

## 2022-07-08 DIAGNOSIS — I462 Cardiac arrest due to underlying cardiac condition: Secondary | ICD-10-CM | POA: Diagnosis not present

## 2022-07-08 DIAGNOSIS — R4020 Unspecified coma: Secondary | ICD-10-CM | POA: Diagnosis not present

## 2022-07-08 DIAGNOSIS — Z7901 Long term (current) use of anticoagulants: Secondary | ICD-10-CM

## 2022-07-08 DIAGNOSIS — J432 Centrilobular emphysema: Secondary | ICD-10-CM | POA: Diagnosis not present

## 2022-07-08 DIAGNOSIS — Z880 Allergy status to penicillin: Secondary | ICD-10-CM

## 2022-07-08 DIAGNOSIS — J9602 Acute respiratory failure with hypercapnia: Principal | ICD-10-CM

## 2022-07-08 DIAGNOSIS — R0603 Acute respiratory distress: Secondary | ICD-10-CM | POA: Diagnosis not present

## 2022-07-08 DIAGNOSIS — R000 Tachycardia, unspecified: Secondary | ICD-10-CM | POA: Diagnosis not present

## 2022-07-08 DIAGNOSIS — I5032 Chronic diastolic (congestive) heart failure: Secondary | ICD-10-CM | POA: Diagnosis present

## 2022-07-08 DIAGNOSIS — Z515 Encounter for palliative care: Secondary | ICD-10-CM

## 2022-07-08 DIAGNOSIS — E440 Moderate protein-calorie malnutrition: Secondary | ICD-10-CM | POA: Diagnosis present

## 2022-07-08 DIAGNOSIS — E785 Hyperlipidemia, unspecified: Secondary | ICD-10-CM | POA: Diagnosis present

## 2022-07-08 DIAGNOSIS — N179 Acute kidney failure, unspecified: Secondary | ICD-10-CM | POA: Diagnosis present

## 2022-07-08 DIAGNOSIS — R062 Wheezing: Secondary | ICD-10-CM | POA: Diagnosis not present

## 2022-07-08 DIAGNOSIS — R069 Unspecified abnormalities of breathing: Secondary | ICD-10-CM | POA: Diagnosis not present

## 2022-07-08 DIAGNOSIS — E119 Type 2 diabetes mellitus without complications: Secondary | ICD-10-CM

## 2022-07-08 DIAGNOSIS — I4892 Unspecified atrial flutter: Secondary | ICD-10-CM | POA: Diagnosis present

## 2022-07-08 DIAGNOSIS — Z809 Family history of malignant neoplasm, unspecified: Secondary | ICD-10-CM

## 2022-07-08 DIAGNOSIS — R0602 Shortness of breath: Secondary | ICD-10-CM | POA: Diagnosis not present

## 2022-07-08 DIAGNOSIS — J440 Chronic obstructive pulmonary disease with acute lower respiratory infection: Secondary | ICD-10-CM | POA: Diagnosis present

## 2022-07-08 DIAGNOSIS — I471 Supraventricular tachycardia, unspecified: Secondary | ICD-10-CM | POA: Diagnosis not present

## 2022-07-08 DIAGNOSIS — E874 Mixed disorder of acid-base balance: Secondary | ICD-10-CM | POA: Diagnosis present

## 2022-07-08 DIAGNOSIS — C799 Secondary malignant neoplasm of unspecified site: Secondary | ICD-10-CM | POA: Diagnosis present

## 2022-07-08 DIAGNOSIS — C7972 Secondary malignant neoplasm of left adrenal gland: Secondary | ICD-10-CM | POA: Diagnosis present

## 2022-07-08 DIAGNOSIS — J9811 Atelectasis: Secondary | ICD-10-CM | POA: Diagnosis not present

## 2022-07-08 DIAGNOSIS — K567 Ileus, unspecified: Secondary | ICD-10-CM | POA: Diagnosis not present

## 2022-07-08 HISTORY — DX: Unspecified diastolic (congestive) heart failure: I50.30

## 2022-07-08 HISTORY — DX: Chest pain, unspecified: R07.9

## 2022-07-08 HISTORY — DX: Chronic obstructive pulmonary disease, unspecified: J44.9

## 2022-07-08 HISTORY — DX: Thoracic aortic ectasia: I77.810

## 2022-07-08 LAB — CBG MONITORING, ED: Glucose-Capillary: 219 mg/dL — ABNORMAL HIGH (ref 70–99)

## 2022-07-08 MED ORDER — IPRATROPIUM-ALBUTEROL 0.5-2.5 (3) MG/3ML IN SOLN
3.0000 mL | Freq: Once | RESPIRATORY_TRACT | Status: AC
  Administered 2022-07-09: 3 mL via RESPIRATORY_TRACT
  Filled 2022-07-08: qty 3

## 2022-07-08 MED ORDER — SODIUM CHLORIDE 0.9 % IV SOLN
500.0000 mg | INTRAVENOUS | Status: AC
  Administered 2022-07-09 – 2022-07-13 (×5): 500 mg via INTRAVENOUS
  Filled 2022-07-08 (×6): qty 5

## 2022-07-08 MED ORDER — NITROGLYCERIN IN D5W 200-5 MCG/ML-% IV SOLN
5.0000 ug/min | INTRAVENOUS | Status: DC
  Administered 2022-07-09: 5 ug/min via INTRAVENOUS
  Filled 2022-07-08: qty 250

## 2022-07-08 MED ORDER — METHYLPREDNISOLONE SODIUM SUCC 125 MG IJ SOLR
125.0000 mg | Freq: Once | INTRAMUSCULAR | Status: AC
  Administered 2022-07-09: 125 mg via INTRAVENOUS
  Filled 2022-07-08: qty 2

## 2022-07-08 MED ORDER — SODIUM CHLORIDE 0.9 % IV SOLN
2.0000 g | INTRAVENOUS | Status: AC
  Administered 2022-07-09 – 2022-07-13 (×5): 2 g via INTRAVENOUS
  Filled 2022-07-08 (×5): qty 20

## 2022-07-08 NOTE — ED Triage Notes (Signed)
Pt brought in by EMS from home with respiratory distress, pt has hx of lung CA and is on continuous home oxygen,pt O2 sats in 80's upon EMS arrival, pt placed on c-pap and given alb neb and duo neb in route- Dr Sedonia Small and RT at bedside upon pt arrival- pt placed on bipap.

## 2022-07-09 ENCOUNTER — Encounter (HOSPITAL_COMMUNITY): Payer: Self-pay | Admitting: Family Medicine

## 2022-07-09 ENCOUNTER — Emergency Department (HOSPITAL_COMMUNITY): Payer: Medicare HMO

## 2022-07-09 ENCOUNTER — Inpatient Hospital Stay (HOSPITAL_COMMUNITY): Payer: Medicare HMO

## 2022-07-09 ENCOUNTER — Encounter (HOSPITAL_COMMUNITY): Payer: Medicare HMO

## 2022-07-09 ENCOUNTER — Other Ambulatory Visit (HOSPITAL_COMMUNITY): Payer: Self-pay | Admitting: *Deleted

## 2022-07-09 ENCOUNTER — Other Ambulatory Visit (HOSPITAL_COMMUNITY): Payer: Medicare HMO

## 2022-07-09 DIAGNOSIS — I483 Typical atrial flutter: Secondary | ICD-10-CM

## 2022-07-09 DIAGNOSIS — C7971 Secondary malignant neoplasm of right adrenal gland: Secondary | ICD-10-CM | POA: Diagnosis not present

## 2022-07-09 DIAGNOSIS — R918 Other nonspecific abnormal finding of lung field: Secondary | ICD-10-CM | POA: Diagnosis not present

## 2022-07-09 DIAGNOSIS — A419 Sepsis, unspecified organism: Secondary | ICD-10-CM | POA: Diagnosis not present

## 2022-07-09 DIAGNOSIS — R0902 Hypoxemia: Secondary | ICD-10-CM | POA: Diagnosis not present

## 2022-07-09 DIAGNOSIS — R7989 Other specified abnormal findings of blood chemistry: Secondary | ICD-10-CM

## 2022-07-09 DIAGNOSIS — J129 Viral pneumonia, unspecified: Secondary | ICD-10-CM | POA: Diagnosis not present

## 2022-07-09 DIAGNOSIS — I5031 Acute diastolic (congestive) heart failure: Secondary | ICD-10-CM | POA: Diagnosis not present

## 2022-07-09 DIAGNOSIS — J9602 Acute respiratory failure with hypercapnia: Secondary | ICD-10-CM | POA: Diagnosis not present

## 2022-07-09 DIAGNOSIS — E782 Mixed hyperlipidemia: Secondary | ICD-10-CM

## 2022-07-09 DIAGNOSIS — C7800 Secondary malignant neoplasm of unspecified lung: Secondary | ICD-10-CM | POA: Diagnosis not present

## 2022-07-09 DIAGNOSIS — C7970 Secondary malignant neoplasm of unspecified adrenal gland: Secondary | ICD-10-CM

## 2022-07-09 DIAGNOSIS — Z515 Encounter for palliative care: Secondary | ICD-10-CM | POA: Diagnosis not present

## 2022-07-09 DIAGNOSIS — C3490 Malignant neoplasm of unspecified part of unspecified bronchus or lung: Secondary | ICD-10-CM | POA: Diagnosis not present

## 2022-07-09 DIAGNOSIS — J9621 Acute and chronic respiratory failure with hypoxia: Secondary | ICD-10-CM | POA: Diagnosis not present

## 2022-07-09 DIAGNOSIS — J441 Chronic obstructive pulmonary disease with (acute) exacerbation: Secondary | ICD-10-CM

## 2022-07-09 DIAGNOSIS — J811 Chronic pulmonary edema: Secondary | ICD-10-CM | POA: Diagnosis not present

## 2022-07-09 DIAGNOSIS — I4891 Unspecified atrial fibrillation: Secondary | ICD-10-CM | POA: Diagnosis not present

## 2022-07-09 DIAGNOSIS — B338 Other specified viral diseases: Secondary | ICD-10-CM | POA: Diagnosis not present

## 2022-07-09 DIAGNOSIS — E440 Moderate protein-calorie malnutrition: Secondary | ICD-10-CM | POA: Diagnosis not present

## 2022-07-09 DIAGNOSIS — R6520 Severe sepsis without septic shock: Secondary | ICD-10-CM | POA: Diagnosis not present

## 2022-07-09 DIAGNOSIS — I169 Hypertensive crisis, unspecified: Secondary | ICD-10-CM

## 2022-07-09 DIAGNOSIS — Z1152 Encounter for screening for COVID-19: Secondary | ICD-10-CM | POA: Diagnosis not present

## 2022-07-09 DIAGNOSIS — C799 Secondary malignant neoplasm of unspecified site: Secondary | ICD-10-CM | POA: Diagnosis present

## 2022-07-09 DIAGNOSIS — A4189 Other specified sepsis: Secondary | ICD-10-CM | POA: Diagnosis not present

## 2022-07-09 DIAGNOSIS — I471 Supraventricular tachycardia, unspecified: Secondary | ICD-10-CM | POA: Diagnosis not present

## 2022-07-09 DIAGNOSIS — Z66 Do not resuscitate: Secondary | ICD-10-CM | POA: Diagnosis not present

## 2022-07-09 DIAGNOSIS — E875 Hyperkalemia: Secondary | ICD-10-CM | POA: Diagnosis not present

## 2022-07-09 DIAGNOSIS — R600 Localized edema: Secondary | ICD-10-CM | POA: Diagnosis not present

## 2022-07-09 DIAGNOSIS — J432 Centrilobular emphysema: Secondary | ICD-10-CM | POA: Diagnosis not present

## 2022-07-09 DIAGNOSIS — I5032 Chronic diastolic (congestive) heart failure: Secondary | ICD-10-CM | POA: Diagnosis not present

## 2022-07-09 DIAGNOSIS — I4892 Unspecified atrial flutter: Secondary | ICD-10-CM | POA: Diagnosis not present

## 2022-07-09 DIAGNOSIS — E119 Type 2 diabetes mellitus without complications: Secondary | ICD-10-CM | POA: Diagnosis not present

## 2022-07-09 DIAGNOSIS — Z6831 Body mass index (BMI) 31.0-31.9, adult: Secondary | ICD-10-CM | POA: Diagnosis not present

## 2022-07-09 DIAGNOSIS — M7989 Other specified soft tissue disorders: Secondary | ICD-10-CM | POA: Diagnosis present

## 2022-07-09 DIAGNOSIS — R579 Shock, unspecified: Secondary | ICD-10-CM | POA: Diagnosis not present

## 2022-07-09 DIAGNOSIS — I469 Cardiac arrest, cause unspecified: Secondary | ICD-10-CM | POA: Diagnosis not present

## 2022-07-09 DIAGNOSIS — R0602 Shortness of breath: Secondary | ICD-10-CM | POA: Diagnosis not present

## 2022-07-09 DIAGNOSIS — J9622 Acute and chronic respiratory failure with hypercapnia: Secondary | ICD-10-CM | POA: Diagnosis not present

## 2022-07-09 DIAGNOSIS — J439 Emphysema, unspecified: Secondary | ICD-10-CM | POA: Diagnosis not present

## 2022-07-09 DIAGNOSIS — Z7189 Other specified counseling: Secondary | ICD-10-CM | POA: Diagnosis not present

## 2022-07-09 DIAGNOSIS — Z4682 Encounter for fitting and adjustment of non-vascular catheter: Secondary | ICD-10-CM | POA: Diagnosis not present

## 2022-07-09 DIAGNOSIS — J9601 Acute respiratory failure with hypoxia: Secondary | ICD-10-CM | POA: Diagnosis present

## 2022-07-09 DIAGNOSIS — E1169 Type 2 diabetes mellitus with other specified complication: Secondary | ICD-10-CM

## 2022-07-09 DIAGNOSIS — J121 Respiratory syncytial virus pneumonia: Secondary | ICD-10-CM | POA: Diagnosis not present

## 2022-07-09 DIAGNOSIS — J440 Chronic obstructive pulmonary disease with acute lower respiratory infection: Secondary | ICD-10-CM | POA: Diagnosis not present

## 2022-07-09 DIAGNOSIS — E870 Hyperosmolality and hypernatremia: Secondary | ICD-10-CM | POA: Diagnosis not present

## 2022-07-09 DIAGNOSIS — J470 Bronchiectasis with acute lower respiratory infection: Secondary | ICD-10-CM | POA: Diagnosis not present

## 2022-07-09 DIAGNOSIS — I110 Hypertensive heart disease with heart failure: Secondary | ICD-10-CM | POA: Diagnosis not present

## 2022-07-09 DIAGNOSIS — J9811 Atelectasis: Secondary | ICD-10-CM | POA: Diagnosis not present

## 2022-07-09 DIAGNOSIS — I462 Cardiac arrest due to underlying cardiac condition: Secondary | ICD-10-CM | POA: Diagnosis not present

## 2022-07-09 DIAGNOSIS — C7972 Secondary malignant neoplasm of left adrenal gland: Secondary | ICD-10-CM | POA: Diagnosis not present

## 2022-07-09 DIAGNOSIS — I21A1 Myocardial infarction type 2: Secondary | ICD-10-CM | POA: Diagnosis not present

## 2022-07-09 LAB — BLOOD GAS, ARTERIAL
Acid-Base Excess: 2.1 mmol/L — ABNORMAL HIGH (ref 0.0–2.0)
Acid-Base Excess: 2.9 mmol/L — ABNORMAL HIGH (ref 0.0–2.0)
Acid-Base Excess: 3.2 mmol/L — ABNORMAL HIGH (ref 0.0–2.0)
Bicarbonate: 34.4 mmol/L — ABNORMAL HIGH (ref 20.0–28.0)
Bicarbonate: 33.6 mmol/L — ABNORMAL HIGH (ref 20.0–28.0)
Bicarbonate: 33.6 mmol/L — ABNORMAL HIGH (ref 20.0–28.0)
Drawn by: 35043
Drawn by: 27016
FIO2: 70 %
FIO2: 45 %
FIO2: 100 %
O2 Saturation: 99.4 %
O2 Saturation: 94 %
O2 Saturation: 99.5 %
Patient temperature: 37.3
Patient temperature: 37
Patient temperature: 37.2
pCO2 arterial: 102 mmHg (ref 32–48)
pCO2 arterial: 84 mmHg (ref 32–48)
pCO2 arterial: 83 mmHg (ref 32–48)
pH, Arterial: 7.14 — CL (ref 7.35–7.45)
pH, Arterial: 7.21 — ABNORMAL LOW (ref 7.35–7.45)
pH, Arterial: 7.22 — ABNORMAL LOW (ref 7.35–7.45)
pO2, Arterial: 147 mmHg — ABNORMAL HIGH (ref 83–108)
pO2, Arterial: 72 mmHg — ABNORMAL LOW (ref 83–108)
pO2, Arterial: 171 mmHg — ABNORMAL HIGH (ref 83–108)

## 2022-07-09 LAB — CBC WITH DIFFERENTIAL/PLATELET
Abs Immature Granulocytes: 0.27 10*3/uL — ABNORMAL HIGH (ref 0.00–0.07)
Basophils Absolute: 0 10*3/uL (ref 0.0–0.1)
Basophils Relative: 0 %
Eosinophils Absolute: 0.1 10*3/uL (ref 0.0–0.5)
Eosinophils Relative: 1 %
HCT: 43.3 % (ref 39.0–52.0)
Hemoglobin: 13.5 g/dL (ref 13.0–17.0)
Immature Granulocytes: 1 %
Lymphocytes Relative: 1 %
Lymphs Abs: 0.2 10*3/uL — ABNORMAL LOW (ref 0.7–4.0)
MCH: 33.6 pg (ref 26.0–34.0)
MCHC: 31.2 g/dL (ref 30.0–36.0)
MCV: 107.7 fL — ABNORMAL HIGH (ref 80.0–100.0)
Monocytes Absolute: 0.6 10*3/uL (ref 0.1–1.0)
Monocytes Relative: 3 %
Neutro Abs: 18.2 10*3/uL — ABNORMAL HIGH (ref 1.7–7.7)
Neutrophils Relative %: 94 %
Platelets: 161 10*3/uL (ref 150–400)
RBC: 4.02 MIL/uL — ABNORMAL LOW (ref 4.22–5.81)
RDW: 14.6 % (ref 11.5–15.5)
WBC: 19.4 10*3/uL — ABNORMAL HIGH (ref 4.0–10.5)
nRBC: 0 % (ref 0.0–0.2)

## 2022-07-09 LAB — BASIC METABOLIC PANEL
Anion gap: 17 — ABNORMAL HIGH (ref 5–15)
Anion gap: 11 (ref 5–15)
BUN: 34 mg/dL — ABNORMAL HIGH (ref 8–23)
BUN: 32 mg/dL — ABNORMAL HIGH (ref 8–23)
CO2: 27 mmol/L (ref 22–32)
CO2: 30 mmol/L (ref 22–32)
Calcium: 8.7 mg/dL — ABNORMAL LOW (ref 8.9–10.3)
Calcium: 8.8 mg/dL — ABNORMAL LOW (ref 8.9–10.3)
Chloride: 95 mmol/L — ABNORMAL LOW (ref 98–111)
Chloride: 96 mmol/L — ABNORMAL LOW (ref 98–111)
Creatinine, Ser: 1.44 mg/dL — ABNORMAL HIGH (ref 0.61–1.24)
Creatinine, Ser: 1.44 mg/dL — ABNORMAL HIGH (ref 0.61–1.24)
GFR, Estimated: 53 mL/min — ABNORMAL LOW (ref >60)
GFR, Estimated: 53 mL/min — ABNORMAL LOW (ref >60)
Glucose, Bld: 230 mg/dL — ABNORMAL HIGH (ref 70–99)
Glucose, Bld: 172 mg/dL — ABNORMAL HIGH (ref 70–99)
Potassium: 5.2 mmol/L — ABNORMAL HIGH (ref 3.5–5.1)
Potassium: 5.1 mmol/L (ref 3.5–5.1)
Sodium: 139 mmol/L (ref 135–145)
Sodium: 137 mmol/L (ref 135–145)

## 2022-07-09 LAB — CBC
HCT: 47.4 % (ref 39.0–52.0)
Hemoglobin: 14.9 g/dL (ref 13.0–17.0)
MCH: 33.9 pg (ref 26.0–34.0)
MCHC: 31.4 g/dL (ref 30.0–36.0)
MCV: 108 fL — ABNORMAL HIGH (ref 80.0–100.0)
Platelets: 204 10*3/uL (ref 150–400)
RBC: 4.39 MIL/uL (ref 4.22–5.81)
RDW: 14.6 % (ref 11.5–15.5)
WBC: 22.3 10*3/uL — ABNORMAL HIGH (ref 4.0–10.5)
nRBC: 0 % (ref 0.0–0.2)

## 2022-07-09 LAB — COMPREHENSIVE METABOLIC PANEL
ALT: 37 U/L (ref 0–44)
ALT: 38 U/L (ref 0–44)
AST: 29 U/L (ref 15–41)
AST: 33 U/L (ref 15–41)
Albumin: 3.7 g/dL (ref 3.5–5.0)
Albumin: 4.1 g/dL (ref 3.5–5.0)
Alkaline Phosphatase: 101 U/L (ref 38–126)
Alkaline Phosphatase: 108 U/L (ref 38–126)
Anion gap: 11 (ref 5–15)
Anion gap: 8 (ref 5–15)
BUN: 29 mg/dL — ABNORMAL HIGH (ref 8–23)
BUN: 32 mg/dL — ABNORMAL HIGH (ref 8–23)
CO2: 31 mmol/L (ref 22–32)
CO2: 33 mmol/L — ABNORMAL HIGH (ref 22–32)
Calcium: 8.8 mg/dL — ABNORMAL LOW (ref 8.9–10.3)
Calcium: 8.9 mg/dL (ref 8.9–10.3)
Chloride: 91 mmol/L — ABNORMAL LOW (ref 98–111)
Chloride: 95 mmol/L — ABNORMAL LOW (ref 98–111)
Creatinine, Ser: 1.08 mg/dL (ref 0.61–1.24)
Creatinine, Ser: 1.3 mg/dL — ABNORMAL HIGH (ref 0.61–1.24)
GFR, Estimated: >60 mL/min (ref >60)
GFR, Estimated: >60 mL/min (ref >60)
Glucose, Bld: 191 mg/dL — ABNORMAL HIGH (ref 70–99)
Glucose, Bld: 208 mg/dL — ABNORMAL HIGH (ref 70–99)
Potassium: 5.2 mmol/L — ABNORMAL HIGH (ref 3.5–5.1)
Potassium: 5.3 mmol/L — ABNORMAL HIGH (ref 3.5–5.1)
Sodium: 132 mmol/L — ABNORMAL LOW (ref 135–145)
Sodium: 137 mmol/L (ref 135–145)
Total Bilirubin: 0.9 mg/dL (ref 0.3–1.2)
Total Bilirubin: 1 mg/dL (ref 0.3–1.2)
Total Protein: 7.5 g/dL (ref 6.5–8.1)
Total Protein: 8.3 g/dL — ABNORMAL HIGH (ref 6.5–8.1)

## 2022-07-09 LAB — RESP PANEL BY RT-PCR (RSV, FLU A&B, COVID)  RVPGX2
Influenza A by PCR: NEGATIVE
Influenza B by PCR: NEGATIVE
Resp Syncytial Virus by PCR: POSITIVE — AB
SARS Coronavirus 2 by RT PCR: NEGATIVE

## 2022-07-09 LAB — URINALYSIS, ROUTINE W REFLEX MICROSCOPIC
Bilirubin Urine: NEGATIVE
Glucose, UA: NEGATIVE mg/dL
Ketones, ur: NEGATIVE mg/dL
Leukocytes,Ua: NEGATIVE
Nitrite: NEGATIVE
Protein, ur: >=300 mg/dL — AB
RBC / HPF: >50 RBC/hpf (ref 0–5)
Specific Gravity, Urine: 1.022 (ref 1.005–1.030)
pH: 5 (ref 5.0–8.0)

## 2022-07-09 LAB — LACTIC ACID, PLASMA
Lactic Acid, Venous: 1.3 mmol/L (ref 0.5–1.9)
Lactic Acid, Venous: 1.7 mmol/L (ref 0.5–1.9)
Lactic Acid, Venous: 2.6 mmol/L (ref 0.5–1.9)
Lactic Acid, Venous: 5.4 mmol/L (ref 0.5–1.9)

## 2022-07-09 LAB — BLOOD GAS, VENOUS
Acid-Base Excess: 2.6 mmol/L — ABNORMAL HIGH (ref 0.0–2.0)
Bicarbonate: 35.3 mmol/L — ABNORMAL HIGH (ref 20.0–28.0)
Drawn by: 7049
FIO2: 90 %
O2 Saturation: 98.9 %
Patient temperature: 36.5
pCO2, Ven: 104 mmHg (ref 44–60)
pH, Ven: 7.14 — CL (ref 7.25–7.43)
pO2, Ven: 131 mmHg — ABNORMAL HIGH (ref 32–45)

## 2022-07-09 LAB — COOXEMETRY PANEL
Carboxyhemoglobin: 2.3 % — ABNORMAL HIGH (ref 0.5–1.5)
Methemoglobin: <0.7 % (ref 0.0–1.5)
O2 Saturation: 75.9 %
Total hemoglobin: 12.4 g/dL (ref 12.0–16.0)

## 2022-07-09 LAB — MRSA NEXT GEN BY PCR, NASAL
MRSA by PCR Next Gen: NOT DETECTED
MRSA by PCR Next Gen: NOT DETECTED

## 2022-07-09 LAB — TROPONIN I (HIGH SENSITIVITY)
Troponin I (High Sensitivity): 118 ng/L (ref <18)
Troponin I (High Sensitivity): 156 ng/L (ref <18)
Troponin I (High Sensitivity): 63 ng/L — ABNORMAL HIGH (ref <18)
Troponin I (High Sensitivity): 73 ng/L — ABNORMAL HIGH (ref <18)
Troponin I (High Sensitivity): 80 ng/L — ABNORMAL HIGH (ref <18)

## 2022-07-09 LAB — GLUCOSE, CAPILLARY
Glucose-Capillary: 180 mg/dL — ABNORMAL HIGH (ref 70–99)
Glucose-Capillary: 196 mg/dL — ABNORMAL HIGH (ref 70–99)
Glucose-Capillary: 213 mg/dL — ABNORMAL HIGH (ref 70–99)
Glucose-Capillary: 106 mg/dL — ABNORMAL HIGH (ref 70–99)
Glucose-Capillary: 108 mg/dL — ABNORMAL HIGH (ref 70–99)
Glucose-Capillary: 158 mg/dL — ABNORMAL HIGH (ref 70–99)

## 2022-07-09 LAB — HIV ANTIBODY (ROUTINE TESTING W REFLEX): HIV Screen 4th Generation wRfx: NONREACTIVE

## 2022-07-09 LAB — HEMOGLOBIN A1C
Hgb A1c MFr Bld: 6.8 % — ABNORMAL HIGH (ref 4.8–5.6)
Mean Plasma Glucose: 148.46 mg/dL

## 2022-07-09 LAB — PROCALCITONIN: Procalcitonin: 0.32 ng/mL

## 2022-07-09 LAB — APTT: aPTT: 27 seconds (ref 24–36)

## 2022-07-09 LAB — MAGNESIUM
Magnesium: 2.1 mg/dL (ref 1.7–2.4)
Magnesium: 2.1 mg/dL (ref 1.7–2.4)

## 2022-07-09 LAB — PROTIME-INR
INR: 1.3 — ABNORMAL HIGH (ref 0.8–1.2)
Prothrombin Time: 15.6 seconds — ABNORMAL HIGH (ref 11.4–15.2)

## 2022-07-09 LAB — PHOSPHORUS: Phosphorus: 4.8 mg/dL — ABNORMAL HIGH (ref 2.5–4.6)

## 2022-07-09 LAB — BRAIN NATRIURETIC PEPTIDE: B Natriuretic Peptide: 438 pg/mL — ABNORMAL HIGH (ref 0.0–100.0)

## 2022-07-09 MED ORDER — ROCURONIUM BROMIDE 10 MG/ML (PF) SYRINGE
PREFILLED_SYRINGE | INTRAVENOUS | Status: AC
  Administered 2022-07-09: 100 mg
  Filled 2022-07-09: qty 10

## 2022-07-09 MED ORDER — ALBUTEROL SULFATE (2.5 MG/3ML) 0.083% IN NEBU
INHALATION_SOLUTION | RESPIRATORY_TRACT | Status: AC
  Administered 2022-07-09: 2.5 mg
  Filled 2022-07-09: qty 6

## 2022-07-09 MED ORDER — ONDANSETRON HCL 4 MG PO TABS: 4.0000 mg | ORAL_TABLET | Freq: Four times a day (QID) | ORAL | Status: DC | PRN

## 2022-07-09 MED ORDER — BUDESONIDE 0.5 MG/2ML IN SUSP
0.5000 mg | Freq: Two times a day (BID) | RESPIRATORY_TRACT | Status: DC
  Administered 2022-07-09 – 2022-07-16 (×15): 0.5 mg via RESPIRATORY_TRACT
  Filled 2022-07-09 (×15): qty 2

## 2022-07-09 MED ORDER — IOHEXOL 350 MG/ML SOLN
75.0000 mL | Freq: Once | INTRAVENOUS | Status: AC | PRN
  Administered 2022-07-09: 75 mL via INTRAVENOUS

## 2022-07-09 MED ORDER — FENTANYL CITRATE PF 50 MCG/ML IJ SOSY: 25.0000 ug | PREFILLED_SYRINGE | INTRAMUSCULAR | Status: DC | PRN

## 2022-07-09 MED ORDER — INSULIN ASPART 100 UNIT/ML IJ SOLN
0.0000 [IU] | INTRAMUSCULAR | Status: DC
  Administered 2022-07-09 (×2): 3 [IU] via SUBCUTANEOUS
  Administered 2022-07-09: 5 [IU] via SUBCUTANEOUS
  Administered 2022-07-09 – 2022-07-11 (×11): 3 [IU] via SUBCUTANEOUS
  Administered 2022-07-11 – 2022-07-12 (×3): 5 [IU] via SUBCUTANEOUS
  Administered 2022-07-12 (×2): 3 [IU] via SUBCUTANEOUS
  Administered 2022-07-12: 5 [IU] via SUBCUTANEOUS
  Administered 2022-07-12 – 2022-07-13 (×3): 3 [IU] via SUBCUTANEOUS
  Administered 2022-07-13: 5 [IU] via SUBCUTANEOUS
  Administered 2022-07-13 (×2): 3 [IU] via SUBCUTANEOUS
  Administered 2022-07-13: 5 [IU] via SUBCUTANEOUS
  Administered 2022-07-13 – 2022-07-14 (×2): 3 [IU] via SUBCUTANEOUS
  Administered 2022-07-14: 5 [IU] via SUBCUTANEOUS
  Administered 2022-07-14: 11 [IU] via SUBCUTANEOUS
  Administered 2022-07-14: 3 [IU] via SUBCUTANEOUS
  Administered 2022-07-14: 5 [IU] via SUBCUTANEOUS
  Administered 2022-07-14: 8 [IU] via SUBCUTANEOUS
  Administered 2022-07-15: 5 [IU] via SUBCUTANEOUS
  Administered 2022-07-15: 8 [IU] via SUBCUTANEOUS
  Administered 2022-07-15: 15 [IU] via SUBCUTANEOUS
  Administered 2022-07-15: 8 [IU] via SUBCUTANEOUS
  Administered 2022-07-15: 5 [IU] via SUBCUTANEOUS
  Administered 2022-07-15: 3 [IU] via SUBCUTANEOUS
  Administered 2022-07-16: 8 [IU] via SUBCUTANEOUS
  Administered 2022-07-16: 11 [IU] via SUBCUTANEOUS
  Administered 2022-07-16: 8 [IU] via SUBCUTANEOUS
  Administered 2022-07-16: 3 [IU] via SUBCUTANEOUS

## 2022-07-09 MED ORDER — ORAL CARE MOUTH RINSE: 15.0000 mL | OROMUCOSAL | Status: DC | PRN

## 2022-07-09 MED ORDER — UMECLIDINIUM BROMIDE 62.5 MCG/ACT IN AEPB: 1.0000 | INHALATION_SPRAY | Freq: Every day | RESPIRATORY_TRACT | Status: DC

## 2022-07-09 MED ORDER — FUROSEMIDE 10 MG/ML IJ SOLN
40.0000 mg | Freq: Once | INTRAMUSCULAR | Status: AC
  Administered 2022-07-09: 40 mg via INTRAVENOUS
  Filled 2022-07-09: qty 4

## 2022-07-09 MED ORDER — PANTOPRAZOLE SODIUM 40 MG IV SOLR
40.0000 mg | INTRAVENOUS | Status: DC
  Administered 2022-07-09 – 2022-07-16 (×8): 40 mg via INTRAVENOUS
  Filled 2022-07-09 (×8): qty 10

## 2022-07-09 MED ORDER — POLYETHYLENE GLYCOL 3350 17 G PO PACK: 17.0000 g | PACK | Freq: Every day | ORAL | Status: DC

## 2022-07-09 MED ORDER — ETOMIDATE 2 MG/ML IV SOLN
INTRAVENOUS | Status: AC
  Administered 2022-07-09: 20 mg
  Filled 2022-07-09: qty 20

## 2022-07-09 MED ORDER — PREDNISONE 20 MG PO TABS: 40.0000 mg | ORAL_TABLET | Freq: Every day | ORAL | Status: DC

## 2022-07-09 MED ORDER — ORAL CARE MOUTH RINSE
15.0000 mL | OROMUCOSAL | Status: DC
  Administered 2022-07-09 – 2022-07-10 (×17): 15 mL via OROMUCOSAL

## 2022-07-09 MED ORDER — METHYLPREDNISOLONE SODIUM SUCC 125 MG IJ SOLR: 125.0000 mg | Freq: Two times a day (BID) | INTRAMUSCULAR | Status: DC

## 2022-07-09 MED ORDER — ARFORMOTEROL TARTRATE 15 MCG/2ML IN NEBU
15.0000 ug | INHALATION_SOLUTION | Freq: Two times a day (BID) | RESPIRATORY_TRACT | Status: DC
  Administered 2022-07-09 – 2022-07-16 (×15): 15 ug via RESPIRATORY_TRACT
  Filled 2022-07-09 (×15): qty 2

## 2022-07-09 MED ORDER — ATORVASTATIN CALCIUM 10 MG PO TABS
20.0000 mg | ORAL_TABLET | Freq: Every evening | ORAL | Status: DC
  Administered 2022-07-09 – 2022-07-15 (×4): 20 mg
  Filled 2022-07-09 (×4): qty 2

## 2022-07-09 MED ORDER — ACETAMINOPHEN 325 MG PO TABS
650.0000 mg | ORAL_TABLET | Freq: Four times a day (QID) | ORAL | Status: DC | PRN
  Administered 2022-07-09: 650 mg
  Filled 2022-07-09 (×2): qty 2

## 2022-07-09 MED ORDER — FENTANYL CITRATE PF 50 MCG/ML IJ SOSY
PREFILLED_SYRINGE | INTRAMUSCULAR | Status: AC
  Filled 2022-07-09: qty 2

## 2022-07-09 MED ORDER — OXYCODONE HCL 5 MG PO TABS: 5.0000 mg | ORAL_TABLET | ORAL | Status: DC | PRN

## 2022-07-09 MED ORDER — ACETAMINOPHEN 325 MG PO TABS: 650.0000 mg | ORAL_TABLET | Freq: Four times a day (QID) | ORAL | Status: DC | PRN

## 2022-07-09 MED ORDER — APIXABAN 5 MG PO TABS
5.0000 mg | ORAL_TABLET | Freq: Two times a day (BID) | ORAL | Status: DC
  Filled 2022-07-09: qty 1

## 2022-07-09 MED ORDER — BUDESON-GLYCOPYRROL-FORMOTEROL 160-9-4.8 MCG/ACT IN AERO: 2.0000 | INHALATION_SPRAY | Freq: Two times a day (BID) | RESPIRATORY_TRACT | Status: DC

## 2022-07-09 MED ORDER — FENTANYL CITRATE (PF) 100 MCG/2ML IJ SOLN
INTRAMUSCULAR | Status: AC
  Filled 2022-07-09: qty 2

## 2022-07-09 MED ORDER — MOMETASONE FURO-FORMOTEROL FUM 100-5 MCG/ACT IN AERO: 2.0000 | INHALATION_SPRAY | Freq: Two times a day (BID) | RESPIRATORY_TRACT | Status: DC

## 2022-07-09 MED ORDER — DEXMEDETOMIDINE HCL IN NACL 400 MCG/100ML IV SOLN
0.0000 ug/kg/h | INTRAVENOUS | Status: DC
  Administered 2022-07-09: 0.4 ug/kg/h via INTRAVENOUS
  Administered 2022-07-09: 0.2 ug/kg/h via INTRAVENOUS
  Filled 2022-07-09 (×3): qty 100

## 2022-07-09 MED ORDER — MIDAZOLAM HCL 2 MG/2ML IJ SOLN: 1.0000 mg | INTRAMUSCULAR | Status: DC | PRN

## 2022-07-09 MED ORDER — CHLORHEXIDINE GLUCONATE CLOTH 2 % EX PADS
6.0000 | MEDICATED_PAD | Freq: Every day | CUTANEOUS | Status: DC
  Administered 2022-07-09 – 2022-07-15 (×6): 6 via TOPICAL

## 2022-07-09 MED ORDER — ONDANSETRON HCL 4 MG/2ML IJ SOLN
4.0000 mg | Freq: Four times a day (QID) | INTRAMUSCULAR | Status: DC | PRN
  Filled 2022-07-09: qty 2

## 2022-07-09 MED ORDER — MIDAZOLAM HCL 2 MG/2ML IJ SOLN
INTRAMUSCULAR | Status: AC
  Filled 2022-07-09: qty 2

## 2022-07-09 MED ORDER — IPRATROPIUM-ALBUTEROL 0.5-2.5 (3) MG/3ML IN SOLN
3.0000 mL | Freq: Four times a day (QID) | RESPIRATORY_TRACT | Status: DC
  Administered 2022-07-09 – 2022-07-16 (×23): 3 mL via RESPIRATORY_TRACT
  Filled 2022-07-09 (×27): qty 3

## 2022-07-09 MED ORDER — SODIUM CHLORIDE 0.9 % IV SOLN
250.0000 mL | INTRAVENOUS | Status: DC
  Administered 2022-07-09 – 2022-07-15 (×5): 250 mL via INTRAVENOUS

## 2022-07-09 MED ORDER — PHENYLEPHRINE HCL-NACL 20-0.9 MG/250ML-% IV SOLN
25.0000 ug/min | INTRAVENOUS | Status: DC
  Administered 2022-07-09 (×2): 50 ug/min via INTRAVENOUS
  Administered 2022-07-09: 25 ug/min via INTRAVENOUS
  Filled 2022-07-09 (×4): qty 250

## 2022-07-09 MED ORDER — ALBUTEROL SULFATE (2.5 MG/3ML) 0.083% IN NEBU
2.5000 mg | INHALATION_SOLUTION | RESPIRATORY_TRACT | Status: DC | PRN
  Administered 2022-07-10: 2.5 mg via RESPIRATORY_TRACT
  Filled 2022-07-09: qty 3

## 2022-07-09 MED ORDER — ATORVASTATIN CALCIUM 10 MG PO TABS: 20.0000 mg | ORAL_TABLET | Freq: Every evening | ORAL | Status: DC

## 2022-07-09 MED ORDER — ONDANSETRON HCL 4 MG/2ML IJ SOLN: 4.0000 mg | Freq: Four times a day (QID) | INTRAMUSCULAR | Status: DC | PRN

## 2022-07-09 MED ORDER — DOCUSATE SODIUM 50 MG/5ML PO LIQD
100.0000 mg | Freq: Two times a day (BID) | ORAL | Status: DC
  Administered 2022-07-09: 100 mg
  Filled 2022-07-09: qty 10

## 2022-07-09 MED ORDER — BISOPROLOL FUMARATE 5 MG PO TABS: 7.5000 mg | ORAL_TABLET | Freq: Every day | ORAL | Status: DC

## 2022-07-09 MED ORDER — PERFLUTREN LIPID MICROSPHERE
1.0000 mL | INTRAVENOUS | Status: AC | PRN
  Administered 2022-07-09: 2 mL via INTRAVENOUS

## 2022-07-09 MED ORDER — METHYLPREDNISOLONE SODIUM SUCC 125 MG IJ SOLR
125.0000 mg | Freq: Two times a day (BID) | INTRAMUSCULAR | Status: DC
  Administered 2022-07-09 – 2022-07-10 (×2): 125 mg via INTRAVENOUS
  Filled 2022-07-09 (×2): qty 2

## 2022-07-09 MED ORDER — SODIUM ZIRCONIUM CYCLOSILICATE 5 G PO PACK
5.0000 g | PACK | Freq: Once | ORAL | Status: AC
  Administered 2022-07-09: 5 g
  Filled 2022-07-09: qty 1

## 2022-07-09 MED ORDER — PROPOFOL 1000 MG/100ML IV EMUL
INTRAVENOUS | Status: AC
  Filled 2022-07-09: qty 100

## 2022-07-09 MED ORDER — IPRATROPIUM BROMIDE 0.02 % IN SOLN
RESPIRATORY_TRACT | Status: AC
  Administered 2022-07-09: 0.5 mg
  Filled 2022-07-09: qty 2.5

## 2022-07-09 MED ORDER — METOPROLOL TARTRATE 5 MG/5ML IV SOLN
INTRAVENOUS | Status: AC
  Filled 2022-07-09: qty 5

## 2022-07-09 MED ORDER — ACETAMINOPHEN 650 MG RE SUPP
650.0000 mg | Freq: Four times a day (QID) | RECTAL | Status: DC | PRN
  Administered 2022-07-12: 650 mg via RECTAL
  Filled 2022-07-09: qty 1

## 2022-07-09 MED ORDER — LACTATED RINGERS IV SOLN: INTRAVENOUS | Status: DC

## 2022-07-09 MED ORDER — IPRATROPIUM-ALBUTEROL 0.5-2.5 (3) MG/3ML IN SOLN
3.0000 mL | RESPIRATORY_TRACT | Status: DC | PRN
  Administered 2022-07-09: 3 mL via RESPIRATORY_TRACT
  Filled 2022-07-09 (×2): qty 3

## 2022-07-09 MED ORDER — ACETAMINOPHEN 650 MG RE SUPP: 650.0000 mg | Freq: Four times a day (QID) | RECTAL | Status: DC | PRN

## 2022-07-09 MED ORDER — APIXABAN 5 MG PO TABS
5.0000 mg | ORAL_TABLET | Freq: Two times a day (BID) | ORAL | Status: DC
  Administered 2022-07-09 – 2022-07-10 (×2): 5 mg
  Filled 2022-07-09 (×2): qty 1

## 2022-07-09 MED ORDER — FENTANYL CITRATE PF 50 MCG/ML IJ SOSY
25.0000 ug | PREFILLED_SYRINGE | INTRAMUSCULAR | Status: DC | PRN
  Administered 2022-07-09: 100 ug via INTRAVENOUS
  Filled 2022-07-09: qty 2

## 2022-07-09 NOTE — Code Documentation (Addendum)
Upon change of shift bedside report, with oncoming nurse, Vito Backers RN, patient found to be standing at the bedside, with BiPAP on. Attempted to redirect patient back into bed, at this time patient began agonal breathing. Code Blue was called.  0706 - Patient became unresponsive with agonal breathing. Compressions were started. Placed pads on patients, with CPR continued. 0708 - Took patient off BiPAP and began to manually ventilate with Ambu Bag.  0709 - Pulse check, with no palpable pulses 0711 - ED Dr. Vanita Panda arrived, with compressions continued. 3494 - 20 mg etomidate given  0714 - 100 mg rocuronium given  0715 - Pulse check, with palpable pulses, compressions discontinued 0716 - Intubation started, successful with no compilations 0717- ETT 26 @ lips 0716 - On Zoll monitor, rhythm vfib  0718 - Cardioversion, with one defibrillation  0719 - Patient converted to sinus tachycardia 0721 - Elink doctor made aware.  9447- BP 99/59, ED MD made aware 3958 - EKG performed, reading sinus tachycardia 0732 - Nitroglycerin drip stopped 0735 - Vent settings RR 20, O2 100, PEEP 5, TV 530   0737 - 18 Fr OG tube in place, 16 Fr temp foley placed  HR 101, BP 128/84 (99), RR 20, O2 sat Continuation of care will resume with oncoming nurse Vito Backers, RN Will update wife, Kamilo Och

## 2022-07-09 NOTE — H&P (Signed)
History and Physical    Patient: [name] MGQ:[id number] DOB: Mar 27, 1956 DOA: 07/13/2022 DOS: the patient was seen and examined on 07/09/2022 PCP: [name] Samples, PA-C  Patient coming from: Home  Chief Complaint:  Chief Complaint  Patient presents with   Respiratory Distress   HPI: [name] is a 67 y.o. male with medical history significant of atrial flutter, CHF, hypertension, lung cancer, and more presents the ED with a chief complaint of dyspnea.  Patient still has increased work of breathing and is on BiPAP.  Because of the acuity of his illness he is not able to provide history.  He came in via EMS with respiratory distress.  He does have a history of lung cancer.  His oxygen sats were in the 80s when EMS arrived.  Patient was placed on CPAP and given a albuterol neb and DuoNeb in route.  Patient was placed on BiPAP upon his arrival to the ER.  Sepsis workup was done.  Patient is positive for RSV which is likely contributing.  No further history could be obtained at this time.  Full code by default as patient is not able to have this discussion at this time. Review of Systems: unable to review all systems due to the inability of the patient to answer questions. Past Medical History:  Diagnosis Date   Atrial flutter ([place])    s/p DCCV 06/03/21   CHF (congestive heart failure) ([place])    Colon cancer ([place]) 04/22/2013   Hypertension    Lung cancer ([place]) 08/26/2021   Renal disorder    Kidney stones   Past Surgical History:  Procedure Laterality Date   BRONCHIAL BIOPSY  08/26/2021   Procedure: BRONCHIAL BIOPSIES;  Surgeon: Garner Nash, DO;  Location: [facility];  Service: Pulmonary;;   BRONCHIAL BRUSHINGS  08/26/2021   Procedure: BRONCHIAL BRUSHINGS;  Surgeon: Garner Nash, DO;  Location: [facility];  Service: Pulmonary;;   BRONCHIAL NEEDLE ASPIRATION BIOPSY  08/26/2021   Procedure: BRONCHIAL NEEDLE ASPIRATION BIOPSIES;  Surgeon: Garner Nash, DO;   Location: [facility];  Service: Pulmonary;;   CARDIOVERSION N/A 06/03/2021   Procedure: CARDIOVERSION;  Surgeon: Satira Sark, MD;  Location: [facility];  Service: Cardiovascular;  Laterality: N/A;   COLONOSCOPY N/A 04/29/2013   Procedure: COLONOSCOPY;  Surgeon: Danie Binder, MD;  [facility];  Service: Endoscopy;  Laterality: N/A;  10:30AM   Cyst removed from left elbow     ENDOVENOUS ABLATION SAPHENOUS VEIN W/ LASER Left 01/16/2019   endovenous laser ablation left greater saphenous vein by Ruta Hinds MD   KIDNEY STONE SURGERY     LITHOTRIPSY     PARTIAL COLECTOMY     TEE WITHOUT CARDIOVERSION N/A 06/03/2021   Procedure: TRANSESOPHAGEAL ECHOCARDIOGRAM (TEE);  Surgeon: Satira Sark, MD;  Location: [facility];  Service: Cardiovascular;  Laterality: N/A;   VIDEO BRONCHOSCOPY WITH ENDOBRONCHIAL ULTRASOUND Bilateral 08/26/2021   Procedure: VIDEO BRONCHOSCOPY WITH ENDOBRONCHIAL ULTRASOUND;  Surgeon: Garner Nash, DO;  Location: [facility];  Service: Pulmonary;  Laterality: Bilateral;  will need Guardant [id number]   Social History:  reports that he has been smoking cigarettes. He has a 10.00 pack-year smoking history. He has never used smokeless tobacco. He reports current alcohol use of about 12.0 standard drinks of alcohol per week. He reports that he does not use drugs.  Allergies  Allergen Reactions   Paclitaxel Shortness Of Breath    Hypersensitivity reaction. See progress note dated 09/27/2021.  Penicillins Other (See Comments)    Childhood Allergy  Has patient had a PCN reaction causing immediate rash, facial/tongue/throat swelling, SOB or lightheadedness with hypotension: No Has patient had a PCN reaction causing severe rash involving mucus membranes or skin necrosis: No Has patient had a PCN reaction that required hospitalization: No Has patient had a PCN reaction occurring within the last 10 years: No If all of the above answers are "NO", then may proceed with  Cephalosporin use.     Family History  Problem Relation Age of Onset   Cancer Mother        pancreatic   Colon cancer Neg Hx     Prior to Admission medications   Medication Sig Start Date End Date Taking? Authorizing Provider  acetaminophen (TYLENOL) 325 MG tablet Take 2 tablets (650 mg total) by mouth every 6 (six) hours as needed for mild pain (or Fever >/= 101). 06/05/21   Roxan Hockey, MD  apixaban (ELIQUIS) 5 MG TABS tablet Take 1 tablet (5 mg total) by mouth 2 (two) times daily. 06/05/21   Roxan Hockey, MD  atorvastatin (LIPITOR) 20 MG tablet Take 1 tablet (20 mg total) by mouth every evening. 06/05/21   Roxan Hockey, MD  bisoprolol (ZEBETA) 5 MG tablet Take 1.5 tablets (7.5 mg total) by mouth daily. 02/15/22   Arnoldo Lenis, MD  Budeson-Glycopyrrol-Formoterol (BREZTRI AEROSPHERE) 160-9-4.8 MCG/ACT AERO Inhale 2 puffs into the lungs in the morning and at bedtime. 03/09/22   Tanda Rockers, MD  glimepiride (AMARYL) 2 MG tablet Take 1 tablet (2 mg total) by mouth every morning. 06/05/21 06/05/22  Roxan Hockey, MD  Multiple Vitamin (MULTI-VITAMIN DAILY PO) Take 1 tablet by mouth daily.    [provider]  potassium chloride (KLOR-CON) 10 MEQ tablet Take 1 tablet (10 mEq total) by mouth daily. Take While taking Lasix/furosemide 06/05/21   Roxan Hockey, MD  predniSONE (DELTASONE) 10 MG tablet 2 daily until better, then 1 daily x 1 week then 1/2 daily 04/12/22   Tanda Rockers, MD  torsemide 40 MG TABS Take 40 mg by mouth every morning. 06/05/21   Roxan Hockey, MD    Physical Exam: Vitals:   07/09/22 0431 07/09/22 0500 07/09/22 0523 07/09/22 0527  BP:      Pulse: 83  86   Resp: (!) 21  (!) 30   Temp:      TempSrc:      SpO2: 96%  98% 96%  Weight:      Height:  6\' 1"  (1.854 m)     1.  General: Patient lying supine in bed,  no acute distress   2. Psychiatric: Alert and oriented x 3, mood and behavior normal for situation, pleasant and cooperative  with exam   3. Neurologic: Speech and language are normal, face is symmetric, moves all 4 extremities voluntarily, at baseline without acute deficits on limited exam   4. HEENMT:  Head is atraumatic, normocephalic, pupils reactive to light, neck is supple, trachea is midline, mucous membranes are moist   5. Respiratory : Rhonchi and significant wheezing bilaterally.  Increased work of breathing.  Maintaining oxygen sats on BiPAP.  No cyanosis.   6. Cardiovascular : Heart rate normal, rhythm is regular, no murmurs, rubs or gallops, peripheral edema present, peripheral pulses palpated   7. Gastrointestinal:  Abdomen is soft, nondistended, nontender to palpation bowel sounds active, no masses or organomegaly palpated   8. Skin:  Skin is warm, dry and intact without rashes, acute  lesions, or ulcers on limited exam   9.Musculoskeletal:  No acute deformities or trauma, no asymmetry in tone, peripheral edema present, peripheral pulses palpated, no tenderness to palpation in the extremities  Data Reviewed: In the ED Temp 97.7-99.2, heart rate 80-95, respiratory rate 1-24, blood pressure 151/89-200/109, on BiPAP FiO2 50% pH 7.1, pCO2 104 Leukocytosis 22.3 Hyperkalemic at 5.2 BNP is elevated at 438 Troponin downtrending from 80>> 60s Blood culture pending Chest x-ray shows mild vascular congestion with no new focal infiltrate Patient was started on Rocephin, Zithromax, given DuoNeb x 3, Solu-Medrol, and nitro drip  Assessment and Plan: * Acute respiratory failure with hypoxia and hypercapnia (HCC) - No oxygen requirement at baseline - Current requiring 50% FiO2 BiPAP - pH 7.1, pCO2 104 - RSV, heart failure, COPD all contributing - See the respective assessment and plans - Wean off O2 as tolerated - Minimally elevated BNP at 438 - Downtrending troponin 80-63 - EKG is without acute ST changes - Continue to monitor  Elevated troponin - Downtrending - No acute ST changes on EKG -  Monitor on telemetry - Continue statin beta-blocker  Hyperkalemia - Initial potassium 5.2 - Repeat BMP - Anticipate improved potassium on next BMP due to albuterol treatments in the ED - Monitor on telemetry  RSV infection - RSV positive - Hypoxic - Chest x-ray does not show any new focal infiltrate - Add on procalcitonin pending - Rocephin and Zithromax started in the ED - Assess need for continued antibiotics with procalcitonin results - Troponin - Blood culture pending - Continue supportive care  Sepsis ([place]) - Tachycardic with leukocytosis and acute respiratory failure with hypoxia - Most likely secondary to RSV - Blood cultures pending - Urine culture pending - Chest x-ray shows no new focal infiltrate - No other infectious symptoms - Continue to monitor  Type 2 diabetes mellitus (HCC) - Holding glimepiride - Sliding scale coverage - Continue to monitor  Acute heart failure (HCC) - Last echo showed 55-60% EF last month - Chest x-ray shows mild vascular congestion - Peripheral edema - Dyspnea with hypoxia requiring BiPAP - Lasix 40 mg IV x 1 - Monitor intake and output - Continue statin, beta-blocker - Continue to monitor  Hypertensive crisis - Continue Zebeta - Given mild vascular congestion patient was started on nitro drip - Continue nitro drip - Blood pressures were as high as 200/109 - Likely triggered by acute illness  Hyperlipidemia - Continue statin  COPD with acute exacerbation (HCC) - In exacerbation with CO2 retention up to pCO2 104 - Likely triggered by RSV infection - Continue scheduled DuoNeb, as needed albuterol - Continue Breztri - Continue steroids - Sputum culture -      Advance Care Planning:   Code Status: Full Code  Consults: None at this time  Family Communication: No family at bedside  Severity of Illness: The appropriate patient status for this patient is INPATIENT. Inpatient status is judged to be reasonable and  necessary in order to provide the required intensity of service to ensure the patient's safety. The patient's presenting symptoms, physical exam findings, and initial radiographic and laboratory data in the context of their chronic comorbidities is felt to place them at high risk for further clinical deterioration. Furthermore, it is not anticipated that the patient will be medically stable for discharge from the hospital within 2 midnights of admission.   * I certify that at the point of admission it is my clinical judgment that the patient will require inpatient hospital care  spanning beyond 2 midnights from the point of admission due to high intensity of service, high risk for further deterioration and high frequency of surveillance required.*  Author: Rolla Plate, DO 07/09/2022 5:59 AM  For on call review [email or web address].

## 2022-07-09 NOTE — Assessment & Plan Note (Addendum)
-   Initial potassium 5.2 - continue to follow electrolytes trend  -continue telemetry monitoring

## 2022-07-09 NOTE — Progress Notes (Addendum)
Patient placed on Bipap when brought in.  Settings 20/6, R16, now at 80% FIO2 and will wean as tolerated.  Two 8ml Duonebs given per MD order.  Diminished BS before treatment.  Will continue to monitor.

## 2022-07-09 NOTE — Assessment & Plan Note (Addendum)
-   Last echo showed 55-60% EF last month - Chest x-ray shows mild vascular congestion - Peripheral edema and elevated BNP appreciated -one time dose of lasix given at time of admission. -due to elevated lactic acid and concerns for sepsis gentle IVF's provided -patient BP is soft with sedation and peripheral pressors started. -will follow volume status and determined the need for durther diuresis -echo ordered.

## 2022-07-09 NOTE — Assessment & Plan Note (Addendum)
-  No oxygen requirement at baseline -Patient ended experiencing respiratory arrest, coded and required to be intubated. - pH 7.1, pCO2 104 - RSV, heart failure, COPD exacerbation, bronchitis/bronchiectasis and pneumonia all contributing -Pulmonology/critical care has been involved and patient transferred to ICU at [facility] IV antibiotics, steroids, ventilatory management and further supportive care -Nebulized bronchodilators will be provided. -Given elevated BNP 2D echo has been ordered and a one-time dose of Lasix provided at time of admission.

## 2022-07-09 NOTE — ED Provider Notes (Signed)
7:42 AM I was called to bedside after the patient had been admitted to the ICU as the patient apparently sustained cardiac arrest.  Per nursing staff patient was standing up, when they went to check on him at shift change.  He soon thereafter collapsed.  He received 1 round of CPR, and on my arrival was exhibiting agonal breathing, no interactivity.  Airway was placed on first attempt, see below, and with the patient was placed on monitors, which she had dislodged, seemingly, when he was upright, he was found to have evidence for V-fib.  Patient received 1 shock for cardioversion, rhythm changed to sinus tachycardia.  I discussed his case at bedside with the oncoming hospitalist care team and with the video ICU nurse before returning to the emergency department.  INTUBATION Performed by: Carmin Muskrat  Required items: required blood products, implants, devices, and special equipment available Patient identity confirmed: provided demographic data and hospital-assigned identification number Time out: Immediately prior to procedure a "time out" was called to verify the correct patient, procedure, equipment, support staff and site/side marked as required.  Indications: cardiac arrest  Intubation method: Glidescope Laryngoscopy   Preoxygenation: BVM  Sedatives: 20Etomidate Paralytic: 100 rocuronium  Tube Size: 7.5 cuffed  Post-procedure assessment: chest rise and ETCO2 monitor Breath sounds: equal and absent over the epigastrium Tube secured with: ETT holder Chest x-ray interpreted by radiologist and me.  Chest x-ray findings: endotracheal tube in appropriate position  Patient tolerated the procedure well with no immediate complications.  .Cardioversion  Date/Time: 07/09/2022 7:20 AM  Performed by: Carmin Muskrat, MD Authorized by: Carmin Muskrat, MD   Consent:    Consent obtained:  Emergent situation   Consent given by:  Healthcare agent Universal protocol:    Patient  identity confirmed:  Arm band Pre-procedure details:    Cardioversion basis:  Emergent   Rhythm:  Ventricular tachycardia   Electrode placement:  Anterior-posterior Patient sedated: Yes. Refer to sedation procedure documentation for details of sedation.  Attempt one:    Waveform:  Biphasic   Shock (Joules):  120   Shock outcome:  Conversion to other rhythm (sinus tachycardia) Post-procedure details:    Patient status:  Unresponsive   Patient tolerance of procedure:  Tolerated well, no immediate complications       Carmin Muskrat, MD 07/09/22 [phone]

## 2022-07-09 NOTE — Progress Notes (Signed)
Date and time results received: 07/09/22 0900  (use smartphrase ".now" to insert current time)  Test: Lactic acid Critical Value: 5.4  Name of Provider Notified: Dr. Dyann Kief and Vito Backers RN   Orders Received? Or Actions Taken?: RN and [rover to follow-up

## 2022-07-09 NOTE — Assessment & Plan Note (Addendum)
-   RSV positive - Hypoxic -follow procalcitonin pending -continue IV Rocephin and Zithromax started in the ED - follow cultures results -continue tx with steroids and Continue supportive care

## 2022-07-09 NOTE — Consult Note (Signed)
Cardiology Consultation   Patient ID: [name] MRN: [id number]; DOB: 1955/09/29  Admit date: 06/24/2022 Date of Consult: 07/09/2022  PCP:  [name] Samples, [place] [facility] Cardiologist:  Carlyle Dolly, MD        Patient Profile:   [name] is a 67 y.o. male with a hx of home O2 dependent COPD, metastatic lung cancer, history of colon cancer, chronic diastolic congestive heart failure, history of atrial flutter, hypertension who is being seen 07/09/2022 for the evaluation of cardiac arrest at the request of Ina Homes, MD.  History of Present Illness:   Most recent echocardiogram January 2023 showed normal LV function, mild left ventricular hypertrophy, flattened septum consistent with elevated pulmonary pressures, mild right ventricular enlargement, mild RV dysfunction, mild biatrial enlargement, moderate tricuspid regurgitation and mildly dilated aortic root.  History is gleaned from the chart as patient is intubated at time of my evaluation.  He was seen at [facility] earlier today with complaints of dyspnea.  His oxygen saturations were in the 80s by report.  He was placed on BiPAP, given albuterol nebulizer and DuoNeb.  Also found to have RSV.  Patient given 1 dose of IV Lasix for vascular congestion.  Patient subsequently transferred to the unit.  By report he was standing up and collapsed.  He was placed on the monitor and said to be in ventricular fibrillation (no rhythm strips available).  He underwent defibrillation x 1.  He was then transferred to [facility] and cardiology is asked to evaluate.  At time of my evaluation he does open his eyes and shakes head to questions.  He denies dyspnea or chest pain at present.  He has not had chest pain prior to this admission and there is been no palpitations or syncope.   Past Medical History:  Diagnosis Date   (HFpEF) heart failure with preserved ejection fraction ([place])     TTE 06/01/2021: EF 60-65, no RWMA, mild LVH, mild reduced RVSF, moderate pulm hypertension (RVSP 47.3), mild BAE, trivial MR, moderate TR, AV sclerosis, aortic root 41 mm, RAP 15   Atrial flutter ([facility])    s/p DCCV 06/03/21   Chest pain    Neg nuclear stress test in 2019   Colon cancer ([place]) 04/22/2013   COPD (chronic obstructive pulmonary disease) ([place])    Dilated aortic root ([place])    TTE 05/2021: 4.1 cm   Hypertension    Lung cancer ([place]) 08/26/2021   Renal disorder    Kidney stones    Past Surgical History:  Procedure Laterality Date   BRONCHIAL BIOPSY  08/26/2021   Procedure: BRONCHIAL BIOPSIES;  Surgeon: Garner Nash, DO;  Location: [facility];  Service: Pulmonary;;   BRONCHIAL BRUSHINGS  08/26/2021   Procedure: BRONCHIAL BRUSHINGS;  Surgeon: Garner Nash, DO;  Location: [facility];  Service: Pulmonary;;   BRONCHIAL NEEDLE ASPIRATION BIOPSY  08/26/2021   Procedure: BRONCHIAL NEEDLE ASPIRATION BIOPSIES;  Surgeon: Garner Nash, DO;  Location: [facility];  Service: Pulmonary;;   CARDIOVERSION N/A 06/03/2021   Procedure: CARDIOVERSION;  Surgeon: Satira Sark, MD;  Location: [facility];  Service: Cardiovascular;  Laterality: N/A;   COLONOSCOPY N/A 04/29/2013   Procedure: COLONOSCOPY;  Surgeon: Danie Binder, MD;  [facility];  Service: Endoscopy;  Laterality: N/A;  10:30AM   Cyst removed from left elbow     ENDOVENOUS ABLATION SAPHENOUS VEIN W/ LASER Left 01/16/2019   endovenous laser ablation  left greater saphenous vein by Ruta Hinds MD   KIDNEY STONE SURGERY     LITHOTRIPSY     PARTIAL COLECTOMY     TEE WITHOUT CARDIOVERSION N/A 06/03/2021   Procedure: TRANSESOPHAGEAL ECHOCARDIOGRAM (TEE);  Surgeon: Satira Sark, MD;  Location: [facility];  Service: Cardiovascular;  Laterality: N/A;   VIDEO BRONCHOSCOPY WITH ENDOBRONCHIAL ULTRASOUND Bilateral 08/26/2021   Procedure: VIDEO BRONCHOSCOPY WITH ENDOBRONCHIAL ULTRASOUND;  Surgeon: Garner Nash, DO;   Location: [facility];  Service: Pulmonary;  Laterality: Bilateral;  will need Guardant [id number]     Inpatient Medications: Scheduled Meds:  apixaban  5 mg Per Tube BID   arformoterol  15 mcg Nebulization BID   atorvastatin  20 mg Per Tube QPM   budesonide (PULMICORT) nebulizer solution  0.5 mg Nebulization BID   Chlorhexidine Gluconate Cloth  6 each Topical Daily   docusate  100 mg Per Tube BID   insulin aspart  0-15 Units Subcutaneous Q4H   ipratropium-albuterol  3 mL Nebulization Q6H   methylPREDNISolone (SOLU-MEDROL) injection  125 mg Intravenous Q12H   mouth rinse  15 mL Mouth Rinse Q2H   pantoprazole (PROTONIX) IV  40 mg Intravenous Q24H   polyethylene glycol  17 g Per Tube Daily   Continuous Infusions:  sodium chloride 250 mL (07/09/22 0924)   azithromycin Stopped (07/09/22 0146)   cefTRIAXone (ROCEPHIN)  IV Stopped (07/09/22 0037)   dexmedetomidine (PRECEDEX) IV infusion 0.4 mcg/kg/hr (07/09/22 0911)   lactated ringers 75 mL/hr at 07/09/22 1206   phenylephrine (NEO-SYNEPHRINE) Adult infusion 25 mcg/min (07/09/22 0908)   propofol Stopped (07/09/22 0751)   PRN Meds: acetaminophen **OR** acetaminophen, albuterol, fentaNYL (SUBLIMAZE) injection, fentaNYL (SUBLIMAZE) injection, midazolam, ondansetron **OR** ondansetron (ZOFRAN) IV, mouth rinse, propofol  Allergies:    Allergies  Allergen Reactions   Paclitaxel Shortness Of Breath    Hypersensitivity reaction. See progress note dated 09/27/2021.   Penicillins Other (See Comments)    Childhood Allergy  Unknown reaction   Has patient had a PCN reaction causing immediate rash, facial/tongue/throat swelling, SOB or lightheadedness with hypotension: No Has patient had a PCN reaction causing severe rash involving mucus membranes or skin necrosis: No Has patient had a PCN reaction that required hospitalization: No Has patient had a PCN reaction occurring within the last 10 years: No If all of the above answers are "NO", then may  proceed with Cephalosporin use.     Social History:   Social History   Socioeconomic History   Marital status: Married    Spouse name: Not on file   Number of children: Not on file   Years of education: Not on file   Highest education level: Not on file  Occupational History   Not on file  Tobacco Use   Smoking status: Every Day    Packs/day: 0.50    Years: 20.00    Total pack years: 10.00    Types: Cigarettes   Smokeless tobacco: Never   Tobacco comments:    1 pack a day MRC 08/24/21  Vaping Use   Vaping Use: Never used  Substance and Sexual Activity   Alcohol use: Yes    Alcohol/week: 12.0 standard drinks of alcohol    Types: 6 Cans of beer, 6 Shots of liquor per week    Comment: couple mixed drinks at night as of 05/31/21   Drug use: No   Sexual activity: Not on file  Other Topics Concern   Not on file  Social History Narrative   Not  on file   Social Determinants of Health   Financial Resource Strain: Medium Risk (01/14/2022)   Overall Financial Resource Strain (CARDIA)    Difficulty of Paying Living Expenses: Somewhat hard  Food Insecurity: No Food Insecurity (01/14/2022)   Hunger Vital Sign    Worried About Running Out of Food in the Last Year: Never true    Ran Out of Food in the Last Year: Never true  Transportation Needs: No Transportation Needs (01/14/2022)   PRAPARE - Hydrologist (Medical): No    Lack of Transportation (Non-Medical): No  Physical Activity: Not on file  Stress: Not on file  Social Connections: Not on file  Intimate Partner Violence: Not on file    Family History:    Family History  Problem Relation Age of Onset   Cancer Mother        pancreatic   Colon cancer Neg Hx      ROS:  Please see the history of present illness.  Patient has had a productive cough at home but no fevers, chills or hemoptysis. All other ROS reviewed and negative.     Physical Exam/Data:   Vitals:   07/09/22 1200 07/09/22 1215  07/09/22 1230 07/09/22 1245  BP: 92/66 97/69 98/63  107/76  Pulse: 79 82 78 81  Resp: (!) 26 19 (!) 26 (!) 26  Temp: 100.2 F (37.9 C) 100.2 F (37.9 C) 100.2 F (37.9 C) (!) 100.4 F (38 C)  TempSrc:      SpO2: 96% 97% 98% 98%  Weight:      Height:        Intake/Output Summary (Last 24 hours) at 07/09/2022 1351 Last data filed at 07/09/2022 0930 Gross per 24 hour  Intake 399.67 ml  Output 830 ml  Net -430.33 ml      07/20/2022   11:53 PM 06/28/2022    8:41 AM 06/22/2022    3:39 PM  Last 3 Weights  Weight (lbs) 277 lb 12.8 oz 277 lb 12.8 oz 276 lb  Weight (kg) 126.009 kg 126.009 kg 125.193 kg     Body mass index is 36.65 kg/m.  General:  Well nourished, well developed, intubated HEENT: normal Neck: no JVD Vascular: No carotid bruits; Distal pulses 2+ bilaterally Cardiac:  normal S1, S2; RRR; no murmur  Lungs: Diminished breath sounds anteriorly but no wheeze noted. Abd: soft, nontender, no hepatomegaly  Ext: 1+ ankle edema; varicosities noted Musculoskeletal: Moves all extremities and no deformities noted. Skin: warm and dry  Neuro: Moves all extremities Psych: Not performed as patient intubated.  EKG:  The EKG was personally reviewed and demonstrates: Sinus rhythm with PVCs, right bundle branch block. Telemetry:  Telemetry was personally reviewed and demonstrates: Sinus rhythm with PVCs  Laboratory Data:  High Sensitivity Troponin:   Recent Labs  Lab 06/30/2022 2347 07/09/22 0234 07/09/22 0753  TROPONINIHS 80* 63* 73*     Chemistry Recent Labs  Lab 07/18/2022 2347 07/09/22 0559 07/09/22 0746  NA 132* 137 139  K 5.2* 5.3* 5.2*  CL 91* 95* 95*  CO2 33* 31 27  GLUCOSE 208* 191* 230*  BUN 29* 32* 34*  CREATININE 1.08 1.30* 1.44*  CALCIUM 8.9 8.8* 8.7*  MG  --  2.1  --   GFRNONAA >60 >60 53*  ANIONGAP 8 11 17*    Recent Labs  Lab 06/25/2022 2347 07/09/22 0559  PROT 8.3* 7.5  ALBUMIN 4.1 3.7  AST 33 29  ALT 38 37  ALKPHOS  108 101  BILITOT 1.0 0.9    Hematology Recent Labs  Lab 07/02/2022 2347 07/09/22 0559  WBC 22.3* 19.4*  RBC 4.39 4.02*  HGB 14.9 13.5  HCT 47.4 43.3  MCV 108.0* 107.7*  MCH 33.9 33.6  MCHC 31.4 31.2  RDW 14.6 14.6  PLT 204 161   BNP Recent Labs  Lab 07/20/2022 2347  BNP 438.0*      Radiology/Studies:  US Venous Img Lower Bilateral (DVT)  Result Date: 07/09/2022 CLINICAL DATA:  RSV, ventilated with bilateral leg edema EXAM: BILATERAL LOWER EXTREMITY VENOUS DOPPLER ULTRASOUND TECHNIQUE: Gray-scale sonography with graded compression, as well as color Doppler and duplex ultrasound were performed to evaluate the lower extremity deep venous systems from the level of the common femoral vein and including the common femoral, femoral, profunda femoral, popliteal and calf veins including the posterior tibial, peroneal and gastrocnemius veins when visible. The superficial great saphenous vein was also interrogated. Spectral Doppler was utilized to evaluate flow at rest and with distal augmentation maneuvers in the common femoral, femoral and popliteal veins. COMPARISON:  None Available. FINDINGS: RIGHT LOWER EXTREMITY Common Femoral Vein: No evidence of thrombus. Normal compressibility, respiratory phasicity and response to augmentation. Saphenofemoral Junction: No evidence of thrombus. Normal compressibility and flow on color Doppler imaging. Profunda Femoral Vein: No evidence of thrombus. Normal compressibility and flow on color Doppler imaging. Femoral Vein: No evidence of thrombus. Normal compressibility, respiratory phasicity and response to augmentation. Popliteal Vein: No evidence of thrombus. Normal compressibility, respiratory phasicity and response to augmentation. Calf Veins: No evidence of thrombus. Normal compressibility and flow on color Doppler imaging. Superficial Great Saphenous Vein: No evidence of thrombus. Normal compressibility. Venous Reflux:  None. Other Findings:  None. LEFT LOWER EXTREMITY Common Femoral  Vein: No evidence of thrombus. Normal compressibility, respiratory phasicity and response to augmentation. Saphenofemoral Junction: No evidence of thrombus. Normal compressibility and flow on color Doppler imaging. Profunda Femoral Vein: No evidence of thrombus. Normal compressibility and flow on color Doppler imaging. Femoral Vein: No evidence of thrombus. Normal compressibility, respiratory phasicity and response to augmentation. Popliteal Vein: No evidence of thrombus. Normal compressibility, respiratory phasicity and response to augmentation. Calf Veins: No evidence of thrombus. Normal compressibility and flow on color Doppler imaging. Superficial Great Saphenous Vein: No evidence of thrombus. Normal compressibility. Venous Reflux:  None. Other Findings:  None. IMPRESSION: No evidence of deep venous thrombosis in either lower extremity. Electronically Signed   By: Jacqulynn Cadet M.D.   On: 07/09/2022 09:37   DG Chest Portable 1 View  Result Date: 07/09/2022 CLINICAL DATA:  Evaluate ET tube placement. EXAM: PORTABLE CHEST 1 VIEW COMPARISON:  07/13/2022 FINDINGS: Two images were obtained. On the first image, ET tube is visualized with tip above the carina. On the second image an enteric tube has been placed with tip in the gastric fundus. Stable cardiomediastinal contours. Lung volumes are low. Diffusely increased interstitial markings are noted bilaterally concerning for pulmonary edema. Atelectasis versus airspace disease noted in the left base. IMPRESSION: 1. Satisfactory position of ET tube with tip above the carina on the second image. Enteric tube tip is in the fundus of the stomach. 2. Mild diffuse interstitial edema. 3. Left base atelectasis versus airspace disease. Electronically Signed   By: Kerby Moors M.D.   On: 07/09/2022 07:51   CT Angio Chest Pulmonary Embolism (PE) W or WO Contrast  Result Date: 07/09/2022 CLINICAL DATA:  Pulmonary embolism suspected, high probability PE versus  pneumonia. History of lung cancer. EXAM:  CT ANGIOGRAPHY CHEST WITH CONTRAST TECHNIQUE: Multidetector CT imaging of the chest was performed using the standard protocol during bolus administration of intravenous contrast. Multiplanar CT image reconstructions and MIPs were obtained to evaluate the vascular anatomy. RADIATION DOSE REDUCTION: This exam was performed according to the departmental dose-optimization program which includes automated exposure control, adjustment of the mA and/or kV according to patient size and/or use of iterative reconstruction technique. CONTRAST:  86mL OMNIPAQUE IOHEXOL 350 MG/ML SOLN COMPARISON:  05/27/2022. FINDINGS: Cardiovascular: Heart is normal in size and three-vessel coronary artery calcification is noted. There is atherosclerotic calcification of the aorta without evidence of aneurysm. The pulmonary trunk is distended which may be associated with underlying pulmonary artery hypertension. Examination of the pulmonary arteries is limited due to mixing artifact and respiratory motion. No large central pulmonary artery filling defect is identified. Mediastinum/Nodes: A prominent lymph node is present in the precarinal space measuring 1.1 cm. No hilar or axillary lymphadenopathy. The thyroid gland, trachea, and esophagus are within normal limits. Lungs/Pleura: Centrilobular emphysematous changes are present in the lungs. Patchy opacities are noted in the lingular segment of the left upper lobe, appears decreased in size from the prior exam, now measuring 2.3 cm, axial image 79. Evaluation of the lungs is limited due to respiratory motion artifact. Atelectasis is noted at the lung bases. No effusion or pneumothorax. Upper Abdomen: Nonobstructive calculus and cysts are noted in the right kidney. There is a right adrenal mass measuring 5 cm, increased in size from the prior exam. The left adrenal gland is within normal limits. No acute abnormality. Musculoskeletal: Degenerative changes  are present in the thoracic spine. No acute or suspicious osseous abnormality. Review of the MIP images confirms the above findings. IMPRESSION: 1. No large central pulmonary embolus. Evaluation of the distal pulmonary arteries is limited due to respiratory motion and mixing artifact. Consider repeat evaluation or V/Q scan if clinically warranted. 2. Left upper lobe opacity, slightly decreased in size from the prior exam and compatible with treatment changes as compared with recent PET-CT. 3. Enlarging right adrenal mass now measuring 5 cm, concerning for metastatic disease. 4. Aortic atherosclerosis and coronary artery calcifications. Electronically Signed   By: Brett Fairy M.D.   On: 07/09/2022 02:56   DG Chest Port 1 View  Result Date: 07/09/2022 CLINICAL DATA:  Shortness of breath EXAM: PORTABLE CHEST 1 VIEW COMPARISON:  06/16/2022 PET-CT FINDINGS: Cardiac shadow is stable. Mild central vascular congestion is noted. Additionally some post treatment changes are noted in the lingula stable from recent PET-CT. No sizable effusion is noted. No new focal infiltrate is seen. No bony abnormality is noted. IMPRESSION: Mild central vascular congestion.  No new focal infiltrate is seen. Electronically Signed   By: Inez Catalina M.D.   On: 07/09/2022 00:05     Assessment and Plan:   Status postcardiac arrest-patient was said to be in ventricular fibrillation at time of event but I have no rhythm strips for documentation.  However presentation seems most consistent with respiratory distress (home O2 dependent COPD with vascular congestion and RSV).  He was also hypoxic at the time of admission.  This most likely precipitated his event.  Will await limited echocardiogram to reassess LV function.  Troponins minimally elevated but no clear trend.  Will continue to cycle.  Follow on telemetry.  Patient has multiple comorbidities including home O2 dependent COPD, metastatic lung cancer.  We can discuss further ischemia  evaluation once he improves from present illness including RSV infection and  once extubated.  Could potentially pursue cardiac CTA pending follow-up echocardiogram. Chronic diastolic congestive heart failure-patient was noted to have vascular congestion at time of admission and given Lasix.  Patient is not markedly volume overloaded on examination.  We will plan to resume home dose of Demadex once he is extubated and renal function stable. History of atrial flutter-patient is in sinus rhythm today.  Will resume apixaban and bisoprolol when he is extubated.  Note CHA2DS2-VASc is at least 3. Ventilator dependent respiratory failure/RSV/COPD-Per critical care medicine. Acute kidney injury-agree with holding diuretics until patient extubated. Metastatic lung cancer  For questions or updates, please contact [facility] Please consult [email or web address] for contact info under    Signed, Kirk Ruths, MD  07/09/2022 1:51 PM

## 2022-07-09 NOTE — ED Notes (Signed)
Wife called for update.  Advised on pt condition and RSV status.  Pt currently resting.  BiPAP on.  Nitro infusing.

## 2022-07-09 NOTE — Progress Notes (Signed)
Progress Note   Patient: [name] QMG:[id number] DOB: 06-16-1955 DOA: 06/27/2022     0 DOS: the patient was seen and examined on 07/09/2022   Brief hospital course: As per H&P written by Dr.Zierle-Ghosh on 07/09/22 [name] is a 67 y.o. male with medical history significant of atrial flutter, CHF, hypertension, lung cancer, and more presents the ED with a chief complaint of dyspnea.  Patient still has increased work of breathing and is on BiPAP.  Because of the acuity of his illness he is not able to provide history.  He came in via EMS with respiratory distress.  He does have a history of lung cancer.  His oxygen sats were in the 80s when EMS arrived.  Patient was placed on CPAP and given a albuterol neb and DuoNeb in route.  Patient was placed on BiPAP upon his arrival to the ER.  Sepsis workup was done.  Patient is positive for RSV which is likely contributing.  No further history could be obtained at this time.   Assessment and Plan: * Acute respiratory failure with hypoxia and hypercapnia (HCC) -No oxygen requirement at baseline -Patient ended experiencing respiratory arrest, coded and required to be intubated. - pH 7.1, pCO2 104 - RSV, heart failure, COPD exacerbation, bronchitis/bronchiectasis and pneumonia all contributing -Pulmonology/critical care has been involved and patient transferred to ICU at [facility] IV antibiotics, steroids, ventilatory management and further supportive care -Nebulized bronchodilators will be provided. -Given elevated BNP 2D echo has been ordered and a one-time dose of Lasix provided at time of admission.  Metastatic cancer ([place]) -Patient with history of colon cancer and lung cancer with metastasis to adrenal glands -Images demonstrating worsening adrenal glands metastatic lesion changes -Continue outpatient follow-up with oncology service -Palliative care has been consulted to further assist with advance care planning  and goals of care. -Patient is currently not receiving any chemotherapy or active treatment.  Swelling of lower extremity -left > than right -Will check lower extremity Dopplers -Continue Eliquis  Elevated troponin - No acute ST changes on EKG - continue to Monitor on telemetry - Continue statin and beta-blocker -check 2-D echo -follow troponin trend  Hyperkalemia - Initial potassium 5.2 - continue to follow electrolytes trend  -continue telemetry monitoring   RSV infection - RSV positive - Hypoxic -follow procalcitonin pending -continue IV Rocephin and Zithromax started in the ED - follow cultures results -continue tx with steroids and Continue supportive care  Sepsis ([place]) - Tachycardic with leukocytosis and acute respiratory failure with hypoxia - Most likely secondary to RSV and component of pneumonia -Continue IV antibiotics -Follow culture results and procalcitonin level -Continue supportive care.  Type 2 diabetes mellitus (HCC) - Holding oral hypoglycemic agents -SSI and now ICU protocol to be started -follow CBG's -Anticipating increased blood sugar levels in the setting of his steroids usage.  Acute heart failure (HCC) - Last echo showed 55-60% EF last month - Chest x-ray shows mild vascular congestion - Peripheral edema and elevated BNP appreciated -one time dose of lasix given at time of admission. -due to elevated lactic acid and concerns for sepsis gentle IVF's provided -patient BP is soft with sedation and peripheral pressors started. -will follow volume status and determined the need for durther diuresis -echo ordered.  Hypertensive crisis - Continue Zebeta - patient initially started on NTG drip -now discontinue as patient BP is soft and requiring peripheral pressors -follow VS and adjust medications as needed.    Atrial flutter ([place]) -  History of atrial flutter -Continue Eliquis and selective beta-blocker   Hyperlipidemia -Continue  statin  COPD with acute exacerbation (HCC) - In exacerbation with CO2 retention up to pCO2 104 - Likely triggered by RSV infection and PNA -continue IV antibiotics, IV steroids and nebulizer management    Subjective:  Afebrile, sedated, intubated and mechanically ventilated.  Physical Exam: Vitals:   07/09/22 0500 07/09/22 0523 07/09/22 0527 07/09/22 0748  BP:      Pulse:  86    Resp:  (!) 30    Temp:    99 F (37.2 C)  TempSrc:    Axillary  SpO2:  98% 96%   Weight:      Height: 6\' 1"  (1.854 m)      General exam: Sedated, intubated and mechanically ventilated; at time of my evaluation no following commands. Respiratory system: Fair air movement bilaterally; positive rhonchi and expiratory wheezing.  Intubated on ventilatory support in place. Cardiovascular system:RRR. No rubs or gallops; unable to assess JVD with body habitus. Gastrointestinal system: Abdomen is obese, nondistended, soft and nontender.  Positive bowel sounds appreciated. Central nervous system: Unable to assess given current level of sedation and ventilatory support. Extremities: No cyanosis or clubbing; 2+ edema appreciated bilaterally (left more than right). Skin: No petechiae.  Chronic lower extremities stasis dermatitis appreciated bilaterally. Psychiatry: Unable to assess given sedation and ventilatory support.  Data Reviewed: Arterial blood gas: pH 7.1, pCO2 102, bicarbonate 34.4; FIo 2 70% (BIPAP) Troponin:80>>63>> BNP: 438 Lactic acid: 5.4 Basic metabolic panel: Sodium 537, potassium 5.2, chloride 95, bicarb 27, glucose 230, BUN 34 and creatinine 1.44   Family Communication: no family at bedside; spouse contacted and updated over the phone.  Disposition: Status is: Inpatient Remains inpatient appropriate because: Critically ill and requiring IV therapy, intubation/ventilatory support and intensive care management.   Planned Discharge Destination:   Time spent: 60 minutes  Author: Barton Dubois, MD 07/09/2022 8:56 AM  For on call review [email or web address].

## 2022-07-09 NOTE — Progress Notes (Signed)
An USGPIV (ultrasound guided PIV) has been placed for short-term vasopressor infusion. A correctly placed ivWatch must be used when administering Vasopressors. Should this treatment be needed beyond 72 hours, central line access should be obtained.  It will be the responsibility of the bedside nurse to follow best practice to prevent extravasations.   

## 2022-07-09 NOTE — Assessment & Plan Note (Addendum)
-   No acute ST changes on EKG - continue to Monitor on telemetry - Continue statin and beta-blocker -check 2-D echo -follow troponin trend

## 2022-07-09 NOTE — Progress Notes (Signed)
Went in room for bed side report and patient was standing by side of bed tried to redirect back to bed with HS nurse Loletha Carrow, got pt in bed and his breathing was agonal called for help because he had no palpable pulse called code a code blue at [facility] ED MD Vanita Panda ran it. See Lucilla Edin and Mamira Rone's notes for time line. Day Hospitalist came up assessed pt and put in orders for patient to be transferred to [facility] and medications. [facility] was called and report given and arrived at 0930. Patient was stable, wife arrived was updated (previously called by MD & HS nurse) and patient left with [facility]  at 1015 wife to follow. Report called to [facility].

## 2022-07-09 NOTE — Sepsis Progress Note (Signed)
Following per sepsis protocol   

## 2022-07-09 NOTE — ED Notes (Signed)
Report called to [place], Therapist, sports.  Call placed to wife to advise of move to ICU, she has no additional questions at this time.

## 2022-07-09 NOTE — Procedures (Signed)
Central Venous Catheter Insertion Procedure Note  [name]  [id number]  1956-03-22  Date:07/09/22  Time:3:34 PM   Provider Performing:Sophi Calligan D. Harris   Procedure: Insertion of Non-tunneled Central Venous [id number]) with US guidance (20233)     Indication(s) Medication administration and Difficult access  Consent Risks of the procedure as well as the alternatives and risks of each were explained to the patient and/or caregiver.  Consent for the procedure was obtained and is signed in the bedside chart  Anesthesia Topical only with 1% lidocaine   Timeout Verified patient identification, verified procedure, site/side was marked, verified correct patient position, special equipment/implants available, medications/allergies/relevant history reviewed, required imaging and test results available.  Sterile Technique Maximal sterile technique including full sterile barrier drape, hand hygiene, sterile gown, sterile gloves, mask, hair covering, sterile ultrasound probe cover (if used).  Procedure Description Area of catheter insertion was cleaned with chlorhexidine and draped in sterile fashion.  With real-time ultrasound guidance a central venous catheter was placed into the right internal jugular vein. Nonpulsatile blood flow and easy flushing noted in all ports.  The catheter was sutured in place and sterile dressing applied.  Complications/Tolerance None; patient tolerated the procedure well. Chest X-ray is ordered to verify placement for internal jugular or subclavian cannulation.   Chest x-ray is not ordered for femoral cannulation.  EBL Minimal  Specimen(s) None  Rudy Domek D. Harris, NP-C [facility] Personal contact information can be found on Amion  If no contact or response made please call [phone] 07/09/2022, 3:34 PM

## 2022-07-09 NOTE — Assessment & Plan Note (Addendum)
-   Holding oral hypoglycemic agents -SSI and now ICU protocol to be started -follow CBG's -Anticipating increased blood sugar levels in the setting of his steroids usage.

## 2022-07-09 NOTE — Progress Notes (Signed)
Report given to [place] at [facility] bed 10

## 2022-07-09 NOTE — Assessment & Plan Note (Signed)
-  Patient with history of colon cancer and lung cancer with metastasis to adrenal glands -Images demonstrating worsening adrenal glands metastatic lesion changes -Continue outpatient follow-up with oncology service -Palliative care has been consulted to further assist with advance care planning and goals of care. -Patient is currently not receiving any chemotherapy or active treatment.

## 2022-07-09 NOTE — Progress Notes (Signed)
Patient transported from ED to ICU without incident.

## 2022-07-09 NOTE — Consult Note (Signed)
Palliative Medicine Inpatient Consult Note  Reason for consult:  GOC, advance directive  HPI:  Per intake H&P  2/17 by Dr. Nori Riis Zierle-Ghosh--> "[name] is a 67 y.o. male with medical history significant of atrial flutter, CHF, hypertension, lung cancer, and more presents the ED with a chief complaint of dyspnea.  Patient still has increased work of breathing and is on BiPAP.  Because of the acuity of his illness he is not able to provide history.  He came in via EMS with respiratory distress.  He does have a history of lung cancer.  His oxygen sats were in the 80s when EMS arrived.  Patient was placed on CPAP and given a albuterol neb and DuoNeb in route.  Patient was placed on BiPAP upon his arrival to the ER.  Sepsis workup was done.  Patient is positive for RSV which is likely contributing.  No further history could be obtained at this time."  At change of shift he was in cardiac arrest after standing and collapsing. He received CPR for one round, was defibrillated x 1 for vfib and intubated.  Afterward he ws transferred to 90M at [facility].  Wife also reports he recently had 3 treatments of immunotherapy but it was stopped. He  has a prior diagnosis of colon cancer in 2015, recent diagnosis of adrenal cancer and was to start 5 doses radiation every other day starting last Wednesday but felt too bad to start. He first started feeling bad last Sunday (1 week ago).He has increase his oxygen from just at night to day time also.   Clinical Assessment/Goals of Care: I have reviewed medical records including EPIC notes, labs and imaging, received report from bedside RN [name], and assessed the patient.    I met with [name] to further discuss diagnosis prognosis, GOC, EOL wishes, disposition and options. [name] in awake in the ICU and heard our conversation. He can nod in response to questions.    I introduced Palliative Medicine as specialized medical care for people  living with serious illness. It focuses on providing relief from the symptoms and stress of a serious illness. The goal is to improve quality of life for both the patient and the family.  A detailed discussion was had today regarding advanced directives.  Concepts specific to code status,  and advanced directives were discussed.  The difference between a aggressive medical intervention path  and a palliative comfort care path for this patient at this time was had. Values and goals of care important to patient and family were attempted to be elicited.  [name] has been married to his wife [name] for 29 years. She has two daughters prior to this marriage. She state [name]. They have 5 grandchildren and one great granddaughter age 33 1/2. One granddaughter is in school to be a Marine scientist. They live in [place] and ahere to a [employer] but declined chaplain support. They live in their house together. He is able to walk using portable oxygen but no assistive devices.   [name] has no written advance directives. His wife says they have talked about his wishes in the past and he wants to be a full code. She does say that she feels his granddaughter is what keeps him going.   Symptoms at home were breathlessness and dyspnea. Patient nods no in response to questions regarding pain or other discomforts.  [name] feels she has received a  good report on what is happening with her husband currently. She states she has faith in the care team. If [name] is able to be intubated tomorrow we can have further conversation with him.   Discussed the importance of continued conversation with family and their  medical providers regarding overall plan of care and treatment options, ensuring decisions are within the context of the patients values and GOCs.  Decision Maker: Patient, wife  SUMMARY OF RECOMMENDATIONS    Code Status/Advance Care Planning: FULL  CODE  Symptom Management:  Breathlessness and dyspnea   Palliative Prophylaxis:  Aspiration, bowel regimen, delirium protocol, and frequent pain assessment.   Additional Recommendations (Limitations, Scope, Preferences): Full scope of treatment Family desires all offered and available medical interventions to prolong life.  Psycho-social/Spiritual:  Desire for further Chaplaincy support: no Emotional support provided Additional Recommendations: Continued conversations with family and patient when he is able.    Prognosis: to be determined  Discharge Planning: to be determined  ROS unable to complete as patient is intubated     07/09/2022   11:30 AM 07/09/2022   11:15 AM 07/09/2022   11:00 AM  Vitals with BMI  Systolic 85 85 81  Diastolic 66 64 55  Pulse 80 84 87    Physical Exam Constitutional:      General: He is in acute distress.     Appearance: He is obese. He is ill-appearing.  HENT:     Mouth/Throat:     Mouth: Mucous membranes are dry.  Cardiovascular:     Rate and Rhythm: Normal rate. Rhythm irregular.  Pulmonary:     Comments: Ventilated, respiratory rate 14 Abdominal:     General: There is distension.  Skin:    General: Skin is warm and dry.     Capillary Refill: Capillary refill takes less than 2 seconds.  Neurological:     Mental Status: He is alert.     Comments: Unable to fully assess, can nod in response to questions     PPS: 10%   This conversation/these recommendations were shared with patient primary care team, Dr. Erskine Ramos.  Thank you for the opportunity to participate in the care of this patient and family.   Total Time: 70 minutes Greater than 50%  of this time was spent counseling and coordinating care related to the above assessment and plan.  Cameron Spar, NP [facility] Palliative Medicine Team Team Cell Phone: [phone] Please utilize secure chat with additional questions, if there is no response within 30 minutes please  call the above phone number  Palliative Medicine Team providers are available by phone from 7am to 7pm daily and can be reached through the team cell phone.  Should this patient require assistance outside of these hours, please call the patient's attending physician.

## 2022-07-09 NOTE — Progress Notes (Signed)
About to transport patient from ED to ICU, Herbie Baltimore, RRT has received report on patient.

## 2022-07-09 NOTE — Assessment & Plan Note (Signed)
-  History of atrial flutter -Continue Eliquis and selective beta-blocker

## 2022-07-09 NOTE — Assessment & Plan Note (Signed)
-  left > than right -Will check lower extremity Dopplers -Continue Eliquis

## 2022-07-09 NOTE — Progress Notes (Signed)
  Echocardiogram 2D Echocardiogram has been performed.  Lana Fish 07/09/2022, 5:32 PM

## 2022-07-09 NOTE — ED Notes (Signed)
Provider notified of critical pH 7.14 and pCO2 104

## 2022-07-09 NOTE — ED Provider Notes (Signed)
[facility] Emergency Department Provider Note MRN:  [id number]  Arrival date & time: 07/09/22     Chief Complaint   Respiratory Distress   History of Present Illness   [name] is a 67 y.o. year-old male with a history of COPD, CHF, lung cancer presenting to the ED with chief complaint of respiratory distress.  EMS called for respiratory distress, 80s on room air at home.  Depressed mental status.  Reportedly had an aspiration event about a week ago on some contrast for a PET scan and has been having lingering cough.  Review of Systems  A thorough review of systems was obtained and all systems are negative except as noted in the HPI and PMH.   Patient's Health History    Past Medical History:  Diagnosis Date   Atrial flutter ([place])    s/p DCCV 06/03/21   CHF (congestive heart failure) ([place])    Colon cancer ([place]) 04/22/2013   Hypertension    Lung cancer ([place]) 08/26/2021   Renal disorder    Kidney stones    Past Surgical History:  Procedure Laterality Date   BRONCHIAL BIOPSY  08/26/2021   Procedure: BRONCHIAL BIOPSIES;  Surgeon: Garner Nash, DO;  Location: [place] [facility];  Service: Pulmonary;;   BRONCHIAL BRUSHINGS  08/26/2021   Procedure: BRONCHIAL BRUSHINGS;  Surgeon: Garner Nash, DO;  Location: [place] [facility];  Service: Pulmonary;;   BRONCHIAL NEEDLE ASPIRATION BIOPSY  08/26/2021   Procedure: BRONCHIAL NEEDLE ASPIRATION BIOPSIES;  Surgeon: Garner Nash, DO;  Location: [place] [facility];  Service: Pulmonary;;   CARDIOVERSION N/A 06/03/2021   Procedure: CARDIOVERSION;  Surgeon: Satira Sark, MD;  Location: [facility];  Service: Cardiovascular;  Laterality: N/A;   COLONOSCOPY N/A 04/29/2013   Procedure: COLONOSCOPY;  Surgeon: Danie Binder, MD;  Location[facility];  Service: Endoscopy;  Laterality: N/A;  10:30AM   Cyst removed from left elbow     ENDOVENOUS ABLATION SAPHENOUS VEIN W/ LASER Left 01/16/2019   endovenous laser ablation  left greater saphenous vein by Ruta Hinds MD   KIDNEY STONE SURGERY     LITHOTRIPSY     PARTIAL COLECTOMY     TEE WITHOUT CARDIOVERSION N/A 06/03/2021   Procedure: TRANSESOPHAGEAL ECHOCARDIOGRAM (TEE);  Surgeon: Satira Sark, MD;  Location: [facility];  Service: Cardiovascular;  Laterality: N/A;   VIDEO BRONCHOSCOPY WITH ENDOBRONCHIAL ULTRASOUND Bilateral 08/26/2021   Procedure: VIDEO BRONCHOSCOPY WITH ENDOBRONCHIAL ULTRASOUND;  Surgeon: Garner Nash, DO;  Location: [place];  Service: Pulmonary;  Laterality: Bilateral;  will need Guardant [id number]    Family History  Problem Relation Age of Onset   Cancer Mother        pancreatic   Colon cancer Neg Hx     Social History   Socioeconomic History   Marital status: Married    Spouse name: Not on file   Number of children: Not on file   Years of education: Not on file   Highest education level: Not on file  Occupational History   Not on file  Tobacco Use   Smoking status: Every Day    Packs/day: 0.50    Years: 20.00    Total pack years: 10.00    Types: Cigarettes   Smokeless tobacco: Never   Tobacco comments:    1 pack a day MRC 08/24/21  Vaping Use   Vaping Use: Never used  Substance and Sexual Activity   Alcohol use: Yes    Alcohol/week: 12.0 standard drinks  of alcohol    Types: 6 Cans of beer, 6 Shots of liquor per week    Comment: couple mixed drinks at night as of 05/31/21   Drug use: No   Sexual activity: Not on file  Other Topics Concern   Not on file  Social History Narrative   Not on file   Social Determinants of Health   Financial Resource Strain: Medium Risk (01/14/2022)   Overall Financial Resource Strain (CARDIA)    Difficulty of Paying Living Expenses: Somewhat hard  Food Insecurity: No Food Insecurity (01/14/2022)   Hunger Vital Sign    Worried About Running Out of Food in the Last Year: Never true    Ran Out of Food in the Last Year: Never true  Transportation Needs: No Transportation Needs  (01/14/2022)   PRAPARE - Hydrologist (Medical): No    Lack of Transportation (Non-Medical): No  Physical Activity: Not on file  Stress: Not on file  Social Connections: Not on file  Intimate Partner Violence: Not on file     Physical Exam   Vitals:   07/09/22 0138 07/09/22 0200  BP:    Pulse:    Resp:    Temp: 99.2 F (37.3 C)   SpO2:  97%    CONSTITUTIONAL: Ill-appearing, severe respiratory distress NEURO/PSYCH: Somnolent, difficult to wake EYES:  eyes equal and reactive ENT/NECK:  no LAD, no JVD CARDIO: Regular rate, well-perfused, normal S1 and S2 PULM: Scattered rhonchi, diminished breath sounds on the left GI/GU:  non-distended, non-tender MSK/SPINE:  No gross deformities, no edema SKIN: Mottled skin   *Additional and/or pertinent findings included in MDM below  Diagnostic and Interventional Summary    EKG Interpretation  Date/Time:  Friday July 08 2022 23:45:13 EST Ventricular Rate:  90 PR Interval:  118 QRS Duration: 146 QT Interval:  388 QTC Calculation: 475 R Axis:   84 Text Interpretation: Sinus rhythm Borderline short PR interval Right bundle branch block No significant change was found Confirmed by Gerlene Fee [id number]) on 07/09/2022 12:31:46 AM       Labs Reviewed  RESP PANEL BY RT-PCR (RSV, FLU A&B, COVID)  RVPGX2 - Abnormal; Notable for the following components:      Result Value   Resp Syncytial Virus by PCR POSITIVE (*)    All other components within normal limits  CBC - Abnormal; Notable for the following components:   WBC 22.3 (*)    MCV 108.0 (*)    All other components within normal limits  COMPREHENSIVE METABOLIC PANEL - Abnormal; Notable for the following components:   Sodium 132 (*)    Potassium 5.2 (*)    Chloride 91 (*)    CO2 33 (*)    Glucose, Bld 208 (*)    BUN 29 (*)    Total Protein 8.3 (*)    All other components within normal limits  BRAIN NATRIURETIC PEPTIDE - Abnormal; Notable for the  following components:   B Natriuretic Peptide 438.0 (*)    All other components within normal limits  BLOOD GAS, VENOUS - Abnormal; Notable for the following components:   pH, Ven 7.14 (*)    pCO2, Ven 104 (*)    pO2, Ven 131 (*)    Bicarbonate 35.3 (*)    Acid-Base Excess 2.6 (*)    All other components within normal limits  PROTIME-INR - Abnormal; Notable for the following components:   Prothrombin Time 15.6 (*)    INR 1.3 (*)  All other components within normal limits  URINALYSIS, ROUTINE W REFLEX MICROSCOPIC - Abnormal; Notable for the following components:   APPearance HAZY (*)    Hgb urine dipstick LARGE (*)    Protein, ur >=300 (*)    Bacteria, UA RARE (*)    All other components within normal limits  BLOOD GAS, ARTERIAL - Abnormal; Notable for the following components:   pH, Arterial 7.14 (*)    pCO2 arterial 102 (*)    pO2, Arterial 147 (*)    Bicarbonate 34.4 (*)    Acid-Base Excess 2.1 (*)    All other components within normal limits  CBG MONITORING, ED - Abnormal; Notable for the following components:   Glucose-Capillary 219 (*)    All other components within normal limits  TROPONIN I (HIGH SENSITIVITY) - Abnormal; Notable for the following components:   Troponin I (High Sensitivity) 80 (*)    All other components within normal limits  TROPONIN I (HIGH SENSITIVITY) - Abnormal; Notable for the following components:   Troponin I (High Sensitivity) 63 (*)    All other components within normal limits  CULTURE, BLOOD (ROUTINE X 2)  CULTURE, BLOOD (ROUTINE X 2)  LACTIC ACID, PLASMA  APTT  BLOOD GAS, ARTERIAL    CT Angio Chest Pulmonary Embolism (PE) W or WO Contrast  Final Result    DG Chest Port 1 View  Final Result      Medications  cefTRIAXone (ROCEPHIN) 2 g in sodium chloride 0.9 % 100 mL IVPB (0 g Intravenous Stopped 07/09/22 0037)  azithromycin (ZITHROMAX) 500 mg in sodium chloride 0.9 % 250 mL IVPB ( Intravenous Infusion Verify 07/09/22 0042)   nitroGLYCERIN 50 mg in dextrose 5 % 250 mL (0.2 mg/mL) infusion (15 mcg/min Intravenous Infusion Verify 07/09/22 0042)  ipratropium-albuterol (DUONEB) 0.5-2.5 (3) MG/3ML nebulizer solution 3 mL (3 mLs Nebulization Given 07/09/22 0222)  methylPREDNISolone sodium succinate (SOLU-MEDROL) 125 mg/2 mL injection 125 mg (125 mg Intravenous Given 07/09/22 0005)  ipratropium-albuterol (DUONEB) 0.5-2.5 (3) MG/3ML nebulizer solution 3 mL (3 mLs Nebulization Given 07/09/22 0013)  ipratropium-albuterol (DUONEB) 0.5-2.5 (3) MG/3ML nebulizer solution 3 mL (3 mLs Nebulization Given 07/09/22 0013)  iohexol (OMNIPAQUE) 350 MG/ML injection 75 mL (75 mLs Intravenous Contrast Given 07/09/22 0219)     Procedures  /  Critical Care .Critical Care  Performed by: Maudie Flakes, MD Authorized by: Maudie Flakes, MD   Critical care provider statement:    Critical care time (minutes):  80   Critical care was necessary to treat or prevent imminent or life-threatening deterioration of the following conditions:  Respiratory failure   Critical care was time spent personally by me on the following activities:  Development of treatment plan with patient or surrogate, discussions with consultants, evaluation of patient's response to treatment, examination of patient, ordering and review of laboratory studies, ordering and review of radiographic studies, ordering and performing treatments and interventions, pulse oximetry, re-evaluation of patient's condition and review of old charts   ED Course and Medical Decision Making  Initial Impression and Ddx Hypoxic respiratory failure and also lingering concern for hypercarbic respiratory failure given the diminished mental status.  History of COPD.  Treating for COPD exacerbation.  Patient also arrives severely hypertensive and rapid portable chest x-ray showing some evidence of edema, starting nitro drip.  There is also concern for pulmonary sepsis given the history of aspiration event.   Code sepsis initiated, providing antibiotics.  PE considered but felt to be lower on the list of  the differential diagnosis.  Past medical/surgical history that increases complexity of ED encounter: COPD, CHF, lung cancer  Interpretation of Diagnostics I personally reviewed the Chest Xray and my interpretation is as follows: Vascular congestion, diffuse opacity of the left lung  Labs reveal prominent leukocytosis, RSV positive, respiratory acidosis with pH of 7.1 and a CO2 of greater than 100.  Patient Reassessment and Ultimate Disposition/Management     Patient is much more alert now that he has been on BiPAP.  Intubation not indicated at this time, will continue to monitor closely.  PE study is overall reassuring.  Symptoms may be driven simply by the RSV infection.  Will admit to hospitalist service.  Patient management required discussion with the following services or consulting groups:  Hospitalist Service  Complexity of Problems Addressed Acute illness or injury that poses threat of life of bodily function  Additional Data Reviewed and Analyzed Further history obtained from: Further history from spouse/family member  Additional Factors Impacting ED Encounter Risk Consideration of hospitalization  Barth Kirks. Sedonia Small, MD [place] [email or web address]  Final Clinical Impressions(s) / ED Diagnoses     ICD-10-CM   1. Acute respiratory failure with hypoxia and hypercapnia (HCC)  J96.01    J96.02       ED Discharge Orders     None        Discharge Instructions Discussed with and Provided to Patient:   Discharge Instructions   None      Maudie Flakes, MD 07/09/22 [phone]

## 2022-07-09 NOTE — Assessment & Plan Note (Addendum)
-   Continue Zebeta - patient initially started on NTG drip -now discontinue as patient BP is soft and requiring peripheral pressors -follow VS and adjust medications as needed.

## 2022-07-09 NOTE — Progress Notes (Signed)
Transported patient to CT and back on Bipap without incident.

## 2022-07-09 NOTE — Assessment & Plan Note (Addendum)
-   Tachycardic with leukocytosis and acute respiratory failure with hypoxia - Most likely secondary to RSV and component of pneumonia -Continue IV antibiotics -Follow culture results and procalcitonin level -Continue supportive care.

## 2022-07-09 NOTE — Progress Notes (Signed)
NAME:  [name], MRN:  [id number], DOB:  20-Mar-1956, LOS: 0 ADMISSION DATE:  06/28/2022, CONSULTATION DATE:  07/09/22 REFERRING MD: Vanita Panda CHIEF COMPLAINT:  SOB   History of Present Illness:  67 year old male with pmhx of atrial flutter, CHF, HTN, Lung cancer, Colon Cancer and presented to [facility] with SOB, increased WOB with O2 Sats in the 80s requiring BIPAP. Patient was positive for RSV upon admission. Patient was admitted to the ICU, where at shift change on 2/17 patient developed a cardiac arrest after standing and collapsing. CPR was initiated and performed for 1 round, intubated for airway protection, and found to have a vfib which one shock was given converting to sinus tachycardia. Patient was transferred to [facility] for further management and care.   Pertinent  Medical History   Past Medical History:  Diagnosis Date   Atrial flutter ([place])    s/p DCCV 06/03/21   CHF (congestive heart failure) ([place])    Colon cancer ([place]) 04/22/2013   Hypertension    Lung cancer ([place]) 08/26/2021   Renal disorder    Kidney stones     Significant Hospital Events: Including procedures, antibiotic start and stop dates in addition to other pertinent events   2/16 Admitted for respiratory failure requiring BIPAP, + for RSV 2/17 V-fib cardiac arrest, intubated, transferred from [facility] to [facility]    Interim History / Subjective:  Respiratory/Cardiac Arrest at [facility] Vented, sedated    Objective   Blood pressure 139/80, pulse (!) 104, temperature 99 F (37.2 C), temperature source Axillary, resp. rate 20, height 6\' 1"  (1.854 m), weight 126 kg, SpO2 100 %.    Vent Mode: PRVC FiO2 (%):  [45 %-100 %] 100 % Set Rate:  [20 bmp] 20 bmp Vt Set:  [640 mL] 640 mL PEEP:  [5 cmH20] 5 cmH20 Plateau Pressure:  [22 cmH20] 22 cmH20   Intake/Output Summary (Last 24 hours) at 07/09/2022 1045 Last data filed at 07/09/2022 0930 Gross per 24 hour  Intake 399.67 ml  Output 830 ml  Net -430.33 ml   Filed Weights    06/26/2022 2353  Weight: 126 kg    Examination: General: chronically ill appearing male, on vent in ICU bed  HENT: Head normocephalic, PERRLA intact 2+ BL, MM Pink Moist, ETT, OG in place  Lungs: Rhonchi upper lung fields and diminished lower lung fields, Vented, no labored breathing or respiratory distress Cardiovascular: s1 and s2 auscultated, no murmurs, gallops, rubs  Abdomen: Obese, distended, soft, BS active in all quadrants, OG clamped  Extremities: generalized edema, 2 + lower extremities vs 1+ upper extremities  Neuro: Following commands arrival, sedated but localizes pain GU: male genitalia intact, foley in place   Resolved Hospital Problem list   N/a   Assessment & Plan:  V fib Cardiac Arrest Hx of Atrial Flutter  Occurred at 0706 per nursing notes at [facility] from [place], appeared to be going into respiratory arrest and vfib cardiac arrest  Lactic Acid 5.4 after, suspect this from cardiac arrest, will trend  BNP 438.0  High Sensitivity troponins 80>63>73  EKG- Right BBB prior hx  Follows [facility] with Cardiology, last appointment 02/24/22 for HFpEF 06/03/21 TEE showed EF of 55-60% P:  Monitor on Cardiac Tele  Cardiology consult for possible ischemia  Evaluate for Ischemia  MAP >65, continue neo  STAT ECHO Repeat Troponins Repeat EKG  Continue Eliquis per Tube   Undifferentiated Shock  Unable able to r/o cardiogenic component, high suspicion  for sedation  P: Will place CVC to monitor [place] and manage hemodynamic status  Pressors for MAP Goal >65  CVP q4hrs  Echo as above  Acute respiratory failure with hypoxia and hypercapnia in the setting pulmonary edema  Hx of COPD  Requiring Bipap for COPD exacerbation likely due to RSV +, Heart Failure  Pco2 102>>83  Intubated for Respiratory Arrest/Cardiac Arrest  Chest X-ray shows pulmonary edema  P:  Continue Steroids Will continue antibiotics ceftriaxone and azithromycin for 24hrs, procal 0.3 Trend  procal, lactic, WBC Continue BD and Nebs  Low tidal volume, PAD protcol, VAD protocol    AKI in setting of arrest and CHF exacerbation Baseline Cr 0.99 on 06/23/22 Cr 1.44  P: Continue to follow Renal Functions Avoid nephrotoxic meds Hold diuretics for now MAP >65 to increase kidney perfusion   Hyperkalemia K 5.2 at [facility]  P: Repeat BMP panel  May need lokelma  Cardiac tele   DM2 213 CBG at 1014  On steroids  P Monitor CBGs q4 SSI   Metastatic Cancer  Lung Ca Hx Lesions spread to Adrenal glands, has history of also colon cancer Follows up with Oncology at [facility]  P:  Outpatient follow up Palliative care consult placed by [facility] Appreciate assistance with GOC  Left lower leg swelling>right Low suspicion of DVT P: Venous Doppler BL for lower extremities   Hyperlipidemia hx P:  Continue home statin     Best Practice (right click and "Reselect all SmartList Selections" daily)   Diet/type: NPO DVT prophylaxis: DOAC GI prophylaxis: N/A Lines: N/A Foley:  Yes, and it is still needed Code Status:  full code Last date of multidisciplinary goals of care discussion family updated at bedside   Labs   CBC: Recent Labs  Lab 06/26/2022 2347 07/09/22 0559  WBC 22.3* 19.4*  NEUTROABS  --  18.2*  HGB 14.9 13.5  HCT 47.4 43.3  MCV 108.0* 107.7*  PLT 204 782    Basic Metabolic Panel: Recent Labs  Lab 06/29/2022 2347 07/09/22 0559 07/09/22 0746  NA 132* 137 139  K 5.2* 5.3* 5.2*  CL 91* 95* 95*  CO2 33* 31 27  GLUCOSE 208* 191* 230*  BUN 29* 32* 34*  CREATININE 1.08 1.30* 1.44*  CALCIUM 8.9 8.8* 8.7*  MG  --  2.1  --    GFR: Estimated Creatinine Clearance: 69.2 mL/min (A) (by C-G formula based on SCr of 1.44 mg/dL (H)). Recent Labs  Lab 07/19/2022 2347 07/09/22 0559 07/09/22 0754  PROCALCITON  --  0.32  --   WBC 22.3* 19.4*  --   LATICACIDVEN 1.7  --  5.4*    Liver Function Tests: Recent Labs  Lab 06/27/2022 2347 07/09/22 0559  AST 33 29  ALT 38 37   ALKPHOS 108 101  BILITOT 1.0 0.9  PROT 8.3* 7.5  ALBUMIN 4.1 3.7   No results for input(s): "LIPASE", "AMYLASE" in the last 168 hours. No results for input(s): "AMMONIA" in the last 168 hours.  ABG    Component Value Date/Time   PHART 7.22 (L) 07/09/2022 0859   PCO2ART 83 (HH) 07/09/2022 0859   PO2ART 171 (H) 07/09/2022 0859   HCO3 33.6 (H) 07/09/2022 0859   O2SAT 99.5 07/09/2022 0859     Coagulation Profile: Recent Labs  Lab 07/18/2022 2343  INR 1.3*    Cardiac Enzymes: No results for input(s): "CKTOTAL", "CKMB", "CKMBINDEX", "TROPONINI" in the last 168 hours.  HbA1C: Hgb A1c MFr Bld  Date/Time Value Ref Range Status  05/31/2021 08:41 AM 7.5 (H) 4.8 - 5.6 % Final    Comment:    (NOTE) Pre diabetes:          5.7%-6.4%  Diabetes:              >6.4%  Glycemic control for   <7.0% adults with diabetes     CBG: Recent Labs  Lab 07/07/2022 2343 07/09/22 0529 07/09/22 0710  GLUCAP 219* 180* 196*    Review of Systems:   Please see the history of present illness. All other systems reviewed and are negative    Past Medical History:  He,  has a past medical history of Atrial flutter ([place]), CHF (congestive heart failure) ([place]), Colon cancer ([place]) (04/22/2013), Hypertension, Lung cancer ([place]) (08/26/2021), and Renal disorder.   Surgical History:   Past Surgical History:  Procedure Laterality Date   BRONCHIAL BIOPSY  08/26/2021   Procedure: BRONCHIAL BIOPSIES;  Surgeon: Garner Nash, DO;  Location: [place] [facility];  Service: Pulmonary;;   BRONCHIAL BRUSHINGS  08/26/2021   Procedure: BRONCHIAL BRUSHINGS;  Surgeon: Garner Nash, DO;  Location: [place] [facility];  Service: Pulmonary;;   BRONCHIAL NEEDLE ASPIRATION BIOPSY  08/26/2021   Procedure: BRONCHIAL NEEDLE ASPIRATION BIOPSIES;  Surgeon: Garner Nash, DO;  Location: [place];  Service: Pulmonary;;   CARDIOVERSION N/A 06/03/2021   Procedure: CARDIOVERSION;  Surgeon: Satira Sark, MD;  Location[facility];   Service: Cardiovascular;  Laterality: N/A;   COLONOSCOPY N/A 04/29/2013   Procedure: COLONOSCOPY;  Surgeon: Danie Binder, MD;  [facility];  Service: Endoscopy;  Laterality: N/A;  10:30AM   Cyst removed from left elbow     ENDOVENOUS ABLATION SAPHENOUS VEIN W/ LASER Left 01/16/2019   endovenous laser ablation left greater saphenous vein by Ruta Hinds MD   KIDNEY STONE SURGERY     LITHOTRIPSY     PARTIAL COLECTOMY     TEE WITHOUT CARDIOVERSION N/A 06/03/2021   Procedure: TRANSESOPHAGEAL ECHOCARDIOGRAM (TEE);  Surgeon: Satira Sark, MD;  Location: [facility];  Service: Cardiovascular;  Laterality: N/A;   VIDEO BRONCHOSCOPY WITH ENDOBRONCHIAL ULTRASOUND Bilateral 08/26/2021   Procedure: VIDEO BRONCHOSCOPY WITH ENDOBRONCHIAL ULTRASOUND;  Surgeon: Garner Nash, DO;  Location: [place];  Service: Pulmonary;  Laterality: Bilateral;  will need Guardant 360CDX     Social History:   reports that he has been smoking cigarettes. He has a 10.00 pack-year smoking history. He has never used smokeless tobacco. He reports current alcohol use of about 12.0 standard drinks of alcohol per week. He reports that he does not use drugs.   Family History:  His family history includes Cancer in his mother. There is no history of Colon cancer.   Allergies Allergies  Allergen Reactions   Paclitaxel Shortness Of Breath    Hypersensitivity reaction. See progress note dated 09/27/2021.   Penicillins Other (See Comments)    Childhood Allergy  Has patient had a PCN reaction causing immediate rash, facial/tongue/throat swelling, SOB or lightheadedness with hypotension: No Has patient had a PCN reaction causing severe rash involving mucus membranes or skin necrosis: No Has patient had a PCN reaction that required hospitalization: No Has patient had a PCN reaction occurring within the last 10 years: No If all of the above answers are "NO", then may proceed with Cephalosporin use.      Home  Medications  Prior to Admission medications   Medication Sig Start Date End Date Taking? Authorizing Provider  acetaminophen (TYLENOL) 325 MG tablet Take 2  tablets (650 mg total) by mouth every 6 (six) hours as needed for mild pain (or Fever >/= 101). 06/05/21   Roxan Hockey, MD  apixaban (ELIQUIS) 5 MG TABS tablet Take 1 tablet (5 mg total) by mouth 2 (two) times daily. 06/05/21   Roxan Hockey, MD  atorvastatin (LIPITOR) 20 MG tablet Take 1 tablet (20 mg total) by mouth every evening. 06/05/21   Roxan Hockey, MD  bisoprolol (ZEBETA) 5 MG tablet Take 1.5 tablets (7.5 mg total) by mouth daily. 02/15/22   Arnoldo Lenis, MD  Budeson-Glycopyrrol-Formoterol (BREZTRI AEROSPHERE) 160-9-4.8 MCG/ACT AERO Inhale 2 puffs into the lungs in the morning and at bedtime. 03/09/22   Tanda Rockers, MD  glimepiride (AMARYL) 2 MG tablet Take 1 tablet (2 mg total) by mouth every morning. 06/05/21 06/05/22  Roxan Hockey, MD  Multiple Vitamin (MULTI-VITAMIN DAILY PO) Take 1 tablet by mouth daily.    [provider]  potassium chloride (KLOR-CON) 10 MEQ tablet Take 1 tablet (10 mEq total) by mouth daily. Take While taking Lasix/furosemide 06/05/21   Roxan Hockey, MD  predniSONE (DELTASONE) 10 MG tablet 2 daily until better, then 1 daily x 1 week then 1/2 daily 04/12/22   Tanda Rockers, MD  torsemide 40 MG TABS Take 40 mg by mouth every morning. 06/05/21   Roxan Hockey, MD     Critical care time: 35 mins   CRITICAL CARE Performed by: Waldo Laine BSN, RN, S-ACNP   Total critical care time: 35 minutes  Critical care time was exclusive of separately billable procedures and treating other patients.  Critical care was necessary to treat or prevent imminent or life-threatening deterioration.  Critical care was time spent personally by me on the following activities: development of treatment plan with patient and/or surrogate as well as nursing, discussions with consultants, evaluation  of patient's response to treatment, examination of patient, obtaining history from patient or surrogate, ordering and performing treatments and interventions, ordering and review of laboratory studies, ordering and review of radiographic studies, pulse oximetry and re-evaluation of patient's condition.   Waldo Laine BSN, RN, S-ACNP

## 2022-07-09 NOTE — Assessment & Plan Note (Addendum)
Continue statin. 

## 2022-07-09 NOTE — Assessment & Plan Note (Addendum)
-   In exacerbation with CO2 retention up to pCO2 104 - Likely triggered by RSV infection and PNA -continue IV antibiotics, IV steroids and nebulizer management

## 2022-07-09 NOTE — Progress Notes (Signed)
[facility] arrived  RT & MD notified will call report to [facility] 60M 02. Lactic Acid 5.4 Pco2 83

## 2022-07-10 DIAGNOSIS — Z7189 Other specified counseling: Secondary | ICD-10-CM

## 2022-07-10 DIAGNOSIS — Z515 Encounter for palliative care: Secondary | ICD-10-CM | POA: Diagnosis not present

## 2022-07-10 DIAGNOSIS — J9602 Acute respiratory failure with hypercapnia: Secondary | ICD-10-CM | POA: Diagnosis not present

## 2022-07-10 DIAGNOSIS — J9601 Acute respiratory failure with hypoxia: Secondary | ICD-10-CM | POA: Diagnosis not present

## 2022-07-10 LAB — BASIC METABOLIC PANEL
Anion gap: 11 (ref 5–15)
BUN: 34 mg/dL — ABNORMAL HIGH (ref 8–23)
CO2: 29 mmol/L (ref 22–32)
Calcium: 8.7 mg/dL — ABNORMAL LOW (ref 8.9–10.3)
Chloride: 99 mmol/L (ref 98–111)
Creatinine, Ser: 1.11 mg/dL (ref 0.61–1.24)
GFR, Estimated: >60 mL/min (ref >60)
Glucose, Bld: 204 mg/dL — ABNORMAL HIGH (ref 70–99)
Potassium: 4.6 mmol/L (ref 3.5–5.1)
Sodium: 139 mmol/L (ref 135–145)

## 2022-07-10 LAB — GLUCOSE, CAPILLARY
Glucose-Capillary: 164 mg/dL — ABNORMAL HIGH (ref 70–99)
Glucose-Capillary: 173 mg/dL — ABNORMAL HIGH (ref 70–99)
Glucose-Capillary: 173 mg/dL — ABNORMAL HIGH (ref 70–99)
Glucose-Capillary: 159 mg/dL — ABNORMAL HIGH (ref 70–99)
Glucose-Capillary: 161 mg/dL — ABNORMAL HIGH (ref 70–99)
Glucose-Capillary: 196 mg/dL — ABNORMAL HIGH (ref 70–99)

## 2022-07-10 LAB — ECHOCARDIOGRAM COMPLETE
AR max vel: 4.15 cm2
AV Area VTI: 4.18 cm2
AV Area mean vel: 3.89 cm2
AV Mean grad: 4 mmHg
AV Peak grad: 6.8 mmHg
Ao pk vel: 1.3 m/s
Area-P 1/2: 3.65 cm2
Calc EF: 57.9 %
Height: 73 in
MV M vel: 2.63 m/s
MV Peak grad: 27.7 mmHg
S' Lateral: 2.8 cm
Single Plane A2C EF: 56.7 %
Single Plane A4C EF: 59.6 %
Weight: 4444.79 oz

## 2022-07-10 LAB — POTASSIUM: Potassium: 4.2 mmol/L (ref 3.5–5.1)

## 2022-07-10 LAB — CBC
HCT: 37.1 % — ABNORMAL LOW (ref 39.0–52.0)
Hemoglobin: 11.8 g/dL — ABNORMAL LOW (ref 13.0–17.0)
MCH: 33.7 pg (ref 26.0–34.0)
MCHC: 31.8 g/dL (ref 30.0–36.0)
MCV: 106 fL — ABNORMAL HIGH (ref 80.0–100.0)
Platelets: 142 10*3/uL — ABNORMAL LOW (ref 150–400)
RBC: 3.5 MIL/uL — ABNORMAL LOW (ref 4.22–5.81)
RDW: 14.7 % (ref 11.5–15.5)
WBC: 11.7 10*3/uL — ABNORMAL HIGH (ref 4.0–10.5)
nRBC: 0 % (ref 0.0–0.2)

## 2022-07-10 MED ORDER — FUROSEMIDE 10 MG/ML IJ SOLN
40.0000 mg | Freq: Four times a day (QID) | INTRAMUSCULAR | Status: DC
  Administered 2022-07-10: 40 mg via INTRAVENOUS
  Filled 2022-07-10: qty 4

## 2022-07-10 MED ORDER — ACETAMINOPHEN 10 MG/ML IV SOLN
1000.0000 mg | Freq: Four times a day (QID) | INTRAVENOUS | Status: AC
  Administered 2022-07-10 – 2022-07-11 (×4): 1000 mg via INTRAVENOUS
  Filled 2022-07-10 (×4): qty 100

## 2022-07-10 MED ORDER — METHYLPREDNISOLONE SODIUM SUCC 125 MG IJ SOLR
60.0000 mg | Freq: Two times a day (BID) | INTRAMUSCULAR | Status: DC
  Administered 2022-07-10 – 2022-07-13 (×6): 60 mg via INTRAVENOUS
  Filled 2022-07-10 (×6): qty 2

## 2022-07-10 MED ORDER — POLYETHYLENE GLYCOL 3350 17 G PO PACK: 17.0000 g | PACK | Freq: Two times a day (BID) | ORAL | Status: DC

## 2022-07-10 MED ORDER — ORAL CARE MOUTH RINSE
15.0000 mL | OROMUCOSAL | Status: DC
  Administered 2022-07-11 – 2022-07-16 (×21): 15 mL via OROMUCOSAL

## 2022-07-10 MED ORDER — FUROSEMIDE 10 MG/ML IJ SOLN
60.0000 mg | Freq: Four times a day (QID) | INTRAMUSCULAR | Status: AC
  Administered 2022-07-10 (×2): 60 mg via INTRAVENOUS
  Filled 2022-07-10 (×2): qty 6

## 2022-07-10 MED ORDER — ORAL CARE MOUTH RINSE: 15.0000 mL | OROMUCOSAL | Status: DC | PRN

## 2022-07-10 NOTE — Progress Notes (Signed)
Palliative:  HPI: 67 y.o. male with medical history significant of atrial flutter, HFpEF, hypertension, colon cancer (in remission), lung cancer (recent adrenal mets plans for rad onc), COPD presents the ED with a chief complaint of dyspnea related to RSV in the setting of lung cancer, COPD, CHF exacerbation. Cardiac arrest 07/09/22 ROSC after 1 round CPR and intubated. Extubated 07/10/22 but still requiring BiPAP support.   I met today with "Nicole Kindred" and he appears alert and appropriately answering questions through BiPAP. He is doing well and tolerating BiPAP well at this time. Difficult to have goals of care conversation via BiPAP. Hopefully he can be weaned from BiPAP so we can have improved conversations.   I spoke briefly with granddaughter at bedside. I also spoke with wife, [name]. [name] is exhausted and overwhelmed. She is pleased that he is improved and off ventilator. [name] has no specific questions or concerns right now and says she tries to ask questions of the nurse about medications to try and piece together what is happening. She is not sure what to ask at this time. She reports that he was set up for radiation therapy but is missing treatments now. Time for outcomes. I explained that we will continue to visit and support her. I would like to have conversations with both Nicole Kindred and [name] if he is able to come off BiPAP.   All questions/concerns addressed. Emotional support provided.   Exam: Awake, alert on BiPAP. Tolerating BiPAP at 50% FiO2. Breathing regular, unlabored. HR regular, tachy. Moves all extremities.   Plan: - Full code, full scope.  - Time for outcomes.  - Ongoing goals of care conversations.   25 min  Vinie Sill, NP Palliative Medicine Team Pager [phone] (Please see [email or web address] for schedule) Team Phone [phone]    Greater than 50%  of this time was spent counseling and coordinating care related to the above assessment and plan

## 2022-07-10 NOTE — Progress Notes (Signed)
NAME:  [name], MRN:  [id number], DOB:  1955/06/01, LOS: 1 ADMISSION DATE:  07/19/2022, CONSULTATION DATE:  07/09/22 REFERRING MD: Vanita Panda CHIEF COMPLAINT:  SOB   History of Present Illness:  67 year old male with pmhx of atrial flutter, CHF, HTN, Lung cancer, Colon Cancer and presented to [facility] with SOB, increased WOB with O2 Sats in the 80s requiring BIPAP. Patient was positive for RSV upon admission. Patient was admitted to the ICU, where at shift change on 2/17 patient developed a cardiac arrest after standing and collapsing. CPR was initiated and performed for 1 round, intubated for airway protection, and found to have a vfib which one shock was given converting to sinus tachycardia. Patient was transferred to [facility] for further management and care.   Pertinent  Medical History   Past Medical History:  Diagnosis Date   (HFpEF) heart failure with preserved ejection fraction ([place])    TTE 06/01/2021: EF 60-65, no RWMA, mild LVH, mild reduced RVSF, moderate pulm hypertension (RVSP 47.3), mild BAE, trivial MR, moderate TR, AV sclerosis, aortic root 41 mm, RAP 15   Atrial flutter ([facility])    s/p DCCV 06/03/21   Chest pain    Neg nuclear stress test in 2019   Colon cancer ([place]) 04/22/2013   COPD (chronic obstructive pulmonary disease) ([place])    Dilated aortic root ([place])    TTE 05/2021: 4.1 cm   Hypertension    Lung cancer ([place]) 08/26/2021   Renal disorder    Kidney stones     Significant Hospital Events: Including procedures, antibiotic start and stop dates in addition to other pertinent events   2/16 Admitted for respiratory failure requiring BIPAP, + for RSV 2/17 V-fib cardiac arrest, intubated, [facility]    Interim History / Subjective:  No events, awake following commands.  Objective   Blood pressure 107/67, pulse 76, temperature 99.7 F (37.6 C), resp. rate (!) 26, height 6\' 1"  (1.854 m), weight 123.1 kg, SpO2 94 %. CVP:  [4 mmHg-14 mmHg] 11 mmHg  Vent Mode:  BIPAP;PSV FiO2 (%):  [50 %-60 %] 50 % Set Rate:  [26 bmp] 26 bmp Vt Set:  [640 mL] 640 mL PEEP:  [5 cmH20-6 cmH20] 6 cmH20 Pressure Support:  [8 cmH20] 8 cmH20 Plateau Pressure:  [23 cmH20-25 cmH20] 25 cmH20   Intake/Output Summary (Last 24 hours) at 07/10/2022 0935 Last data filed at 07/10/2022 7035 Gross per 24 hour  Intake 2453.65 ml  Output 1405 ml  Net 1048.65 ml    Filed Weights   06/23/2022 2353 07/10/22 0351  Weight: 126 kg 123.1 kg    Examination: No distress Wheezing in lungs, triggers vent Heart sounds are regular for me Ext with chronic L>R lymphedema Moves ext to command Scattered bruising  Trops pretty flat Echo with terrible windows, await formal read CVP only 11 [place] 75  Awaiting CBC and BMP  Resolved Hospital Problem list   N/a   Assessment & Plan:  Vfib cardiac arrest- in context of AECOPD and RSV pneumonia Acute on chronic hypoxemic and hypercarbic respiratory failure- due to RSV pneumonia.  On CAP coverage Probable type 2 NSTEMI Advanced COPD in flare Stage IV lung cancer mets to adrenal gland Hyperkalemia, question of volume overload vs. Chronic lymphedema- suspect latter Afluterr  - f/u AM labs - SAT/SBT consider extubation to BIPAP - CAP coverage, nebs, steroids as ordered - Diurese either PO or IV depending on cards input - Progressive mobility etc - Continue  home eliquis - Appreciate PMT meeting with family, they are hoping he can bounce back from this  Best Practice (right click and "Reselect all SmartList Selections" daily)   Diet/type: NPO DVT prophylaxis: DOAC GI prophylaxis: N/A Lines: keep for now Foley:  Yes, and it is still needed Code Status:  full code Last date of multidisciplinary goals of care discussion family updated at bedside 2/18  33 min cc time Erskine Emery MD PCCM

## 2022-07-10 NOTE — Progress Notes (Signed)
Pt on continuous BiPAP. Pt has desaturations when BiPAP removed for mouth care. Unable to perform post extubation swallow screen due to need for continuous BiPAP. Pt unable to receive scheduled PM dose of Eliquis.  [facility] contacted and made aware.

## 2022-07-10 NOTE — Procedures (Signed)
Extubation Procedure Note  Patient Details:   Name: [name] DOB: 21-Mar-1956 MRN: [id number]   Airway Documentation:    Vent end date: 07/10/22 Vent end time: 0845   Evaluation  O2 sats: currently acceptable Complications: No apparent complications Patient did tolerate procedure well. Bilateral Breath Sounds: Clear, Expiratory wheezes   Patient extubated & placed on Bipap per MD orders. Patient alert/oriented & able to speak/cough.  Kathie Dike 07/10/2022, 8:56 AM

## 2022-07-10 NOTE — Progress Notes (Signed)
Rounding Note    Patient Name: [name] Date of Encounter: 07/10/2022  [place] Cardiologist: Carlyle Dolly, MD   Subjective   No CP; mild dyspnea; on bipap  Inpatient Medications    Scheduled Meds:  apixaban  5 mg Per Tube BID   arformoterol  15 mcg Nebulization BID   atorvastatin  20 mg Per Tube QPM   budesonide (PULMICORT) nebulizer solution  0.5 mg Nebulization BID   Chlorhexidine Gluconate Cloth  6 each Topical Daily   docusate  100 mg Per Tube BID   insulin aspart  0-15 Units Subcutaneous Q4H   ipratropium-albuterol  3 mL Nebulization Q6H   methylPREDNISolone (SOLU-MEDROL) injection  60 mg Intravenous Q12H   mouth rinse  15 mL Mouth Rinse Q2H   pantoprazole (PROTONIX) IV  40 mg Intravenous Q24H   polyethylene glycol  17 g Per Tube Daily   Continuous Infusions:  sodium chloride 10 mL/hr at 07/10/22 0800   azithromycin Stopped (07/10/22 0042)   cefTRIAXone (ROCEPHIN)  IV Stopped (07/10/22 0144)   dexmedetomidine (PRECEDEX) IV infusion Stopped (07/10/22 0707)   lactated ringers Stopped (07/09/22 1214)   phenylephrine (NEO-SYNEPHRINE) Adult infusion Stopped (07/10/22 0707)   PRN Meds: acetaminophen **OR** acetaminophen, albuterol, fentaNYL (SUBLIMAZE) injection, fentaNYL (SUBLIMAZE) injection, midazolam, ondansetron **OR** ondansetron (ZOFRAN) IV, mouth rinse   Vital Signs    Vitals:   07/10/22 0800 07/10/22 0805 07/10/22 0815 07/10/22 0855  BP: 100/67  107/67   Pulse: 66  76   Resp: 13  (!) 26   Temp: 99.7 F (37.6 C)  99.7 F (37.6 C)   TempSrc: Bladder     SpO2: 94% 93% 98% 94%  Weight:      Height:        Intake/Output Summary (Last 24 hours) at 07/10/2022 0939 Last data filed at 07/10/2022 0843 Gross per 24 hour  Intake 2453.65 ml  Output 1405 ml  Net 1048.65 ml      07/10/2022    3:51 AM 06/29/2022   11:53 PM 06/28/2022    8:41 AM  Last 3 Weights  Weight (lbs) 271 lb 6.2 oz 277 lb 12.8 oz 277 lb 12.8 oz  Weight (kg)  123.1 kg 126.009 kg 126.009 kg      Telemetry    Sinus with pacs- Personally Reviewed   Physical Exam   GEN: No acute distress.   Neck: supple Cardiac: RRR, no murmurs, rubs, or gallops.  Respiratory: Diminished BS GI: Soft, nontender, non-distended  MS: 1+ edema Neuro:  Nonfocal  Psych: Normal affect   Labs    High Sensitivity Troponin:   Recent Labs  Lab 07/01/2022 2347 07/09/22 0234 07/09/22 0753 07/09/22 1250 07/09/22 1450  TROPONINIHS 80* 63* 73* 118* 156*     Chemistry Recent Labs  Lab 06/24/2022 2347 07/09/22 0559 07/09/22 0746 07/09/22 1250  NA 132* 137 139 137  K 5.2* 5.3* 5.2* 5.1  CL 91* 95* 95* 96*  CO2 33* 31 27 30   GLUCOSE 208* 191* 230* 172*  BUN 29* 32* 34* 32*  CREATININE 1.08 1.30* 1.44* 1.44*  CALCIUM 8.9 8.8* 8.7* 8.8*  MG  --  2.1  --  2.1  PROT 8.3* 7.5  --   --   ALBUMIN 4.1 3.7  --   --   AST 33 29  --   --   ALT 38 37  --   --   ALKPHOS 108 101  --   --   BILITOT 1.0 0.9  --   --  GFRNONAA >60 >60 53* 53*  ANIONGAP 8 11 17* 11   Hematology Recent Labs  Lab 07/14/2022 2347 07/09/22 0559 07/10/22 0859  WBC 22.3* 19.4* 11.7*  RBC 4.39 4.02* 3.50*  HGB 14.9 13.5 11.8*  HCT 47.4 43.3 37.1*  MCV 108.0* 107.7* 106.0*  MCH 33.9 33.6 33.7  MCHC 31.4 31.2 31.8  RDW 14.6 14.6 14.7  PLT 204 161 142*    BNP Recent Labs  Lab 07/18/2022 2347  BNP 438.0*      Radiology    DG CHEST PORT 1 VIEW  Result Date: 07/09/2022 CLINICAL DATA:  Central line placement EXAM: PORTABLE CHEST - 1 VIEW COMPARISON:  07/09/2022 FINDINGS: Unchanged cardiomegaly and mild pulmonary vascular congestion. Airspace opacity within the medial lung bases bilaterally similar to prior examination. NG tube terminates in the left upper quadrant. Endotracheal tube tip is located approximately 2 cm of the carina. Newly placed right IJ central venous catheter terminates in the region of the superior vena cava. There is no pneumothorax. IMPRESSION: 1. Newly placed  right IJ central venous catheter terminates in the region of the superior vena cava. 2. Unchanged cardiomegaly and bibasilar airspace opacities. Electronically Signed   By: Miachel Roux M.D.   On: 07/09/2022 15:44   US Venous Img Lower Bilateral (DVT)  Result Date: 07/09/2022 CLINICAL DATA:  RSV, ventilated with bilateral leg edema EXAM: BILATERAL LOWER EXTREMITY VENOUS DOPPLER ULTRASOUND TECHNIQUE: Gray-scale sonography with graded compression, as well as color Doppler and duplex ultrasound were performed to evaluate the lower extremity deep venous systems from the level of the common femoral vein and including the common femoral, femoral, profunda femoral, popliteal and calf veins including the posterior tibial, peroneal and gastrocnemius veins when visible. The superficial great saphenous vein was also interrogated. Spectral Doppler was utilized to evaluate flow at rest and with distal augmentation maneuvers in the common femoral, femoral and popliteal veins. COMPARISON:  None Available. FINDINGS: RIGHT LOWER EXTREMITY Common Femoral Vein: No evidence of thrombus. Normal compressibility, respiratory phasicity and response to augmentation. Saphenofemoral Junction: No evidence of thrombus. Normal compressibility and flow on color Doppler imaging. Profunda Femoral Vein: No evidence of thrombus. Normal compressibility and flow on color Doppler imaging. Femoral Vein: No evidence of thrombus. Normal compressibility, respiratory phasicity and response to augmentation. Popliteal Vein: No evidence of thrombus. Normal compressibility, respiratory phasicity and response to augmentation. Calf Veins: No evidence of thrombus. Normal compressibility and flow on color Doppler imaging. Superficial Great Saphenous Vein: No evidence of thrombus. Normal compressibility. Venous Reflux:  None. Other Findings:  None. LEFT LOWER EXTREMITY Common Femoral Vein: No evidence of thrombus. Normal compressibility, respiratory phasicity and  response to augmentation. Saphenofemoral Junction: No evidence of thrombus. Normal compressibility and flow on color Doppler imaging. Profunda Femoral Vein: No evidence of thrombus. Normal compressibility and flow on color Doppler imaging. Femoral Vein: No evidence of thrombus. Normal compressibility, respiratory phasicity and response to augmentation. Popliteal Vein: No evidence of thrombus. Normal compressibility, respiratory phasicity and response to augmentation. Calf Veins: No evidence of thrombus. Normal compressibility and flow on color Doppler imaging. Superficial Great Saphenous Vein: No evidence of thrombus. Normal compressibility. Venous Reflux:  None. Other Findings:  None. IMPRESSION: No evidence of deep venous thrombosis in either lower extremity. Electronically Signed   By: Jacqulynn Cadet M.D.   On: 07/09/2022 09:37   DG Chest Portable 1 View  Result Date: 07/09/2022 CLINICAL DATA:  Evaluate ET tube placement. EXAM: PORTABLE CHEST 1 VIEW COMPARISON:  06/24/2022 FINDINGS: Two images  were obtained. On the first image, ET tube is visualized with tip above the carina. On the second image an enteric tube has been placed with tip in the gastric fundus. Stable cardiomediastinal contours. Lung volumes are low. Diffusely increased interstitial markings are noted bilaterally concerning for pulmonary edema. Atelectasis versus airspace disease noted in the left base. IMPRESSION: 1. Satisfactory position of ET tube with tip above the carina on the second image. Enteric tube tip is in the fundus of the stomach. 2. Mild diffuse interstitial edema. 3. Left base atelectasis versus airspace disease. Electronically Signed   By: Kerby Moors M.D.   On: 07/09/2022 07:51   CT Angio Chest Pulmonary Embolism (PE) W or WO Contrast  Result Date: 07/09/2022 CLINICAL DATA:  Pulmonary embolism suspected, high probability PE versus pneumonia. History of lung cancer. EXAM: CT ANGIOGRAPHY CHEST WITH CONTRAST TECHNIQUE:  Multidetector CT imaging of the chest was performed using the standard protocol during bolus administration of intravenous contrast. Multiplanar CT image reconstructions and MIPs were obtained to evaluate the vascular anatomy. RADIATION DOSE REDUCTION: This exam was performed according to the departmental dose-optimization program which includes automated exposure control, adjustment of the mA and/or kV according to patient size and/or use of iterative reconstruction technique. CONTRAST:  36mL OMNIPAQUE IOHEXOL 350 MG/ML SOLN COMPARISON:  05/27/2022. FINDINGS: Cardiovascular: Heart is normal in size and three-vessel coronary artery calcification is noted. There is atherosclerotic calcification of the aorta without evidence of aneurysm. The pulmonary trunk is distended which may be associated with underlying pulmonary artery hypertension. Examination of the pulmonary arteries is limited due to mixing artifact and respiratory motion. No large central pulmonary artery filling defect is identified. Mediastinum/Nodes: A prominent lymph node is present in the precarinal space measuring 1.1 cm. No hilar or axillary lymphadenopathy. The thyroid gland, trachea, and esophagus are within normal limits. Lungs/Pleura: Centrilobular emphysematous changes are present in the lungs. Patchy opacities are noted in the lingular segment of the left upper lobe, appears decreased in size from the prior exam, now measuring 2.3 cm, axial image 79. Evaluation of the lungs is limited due to respiratory motion artifact. Atelectasis is noted at the lung bases. No effusion or pneumothorax. Upper Abdomen: Nonobstructive calculus and cysts are noted in the right kidney. There is a right adrenal mass measuring 5 cm, increased in size from the prior exam. The left adrenal gland is within normal limits. No acute abnormality. Musculoskeletal: Degenerative changes are present in the thoracic spine. No acute or suspicious osseous abnormality. Review of  the MIP images confirms the above findings. IMPRESSION: 1. No large central pulmonary embolus. Evaluation of the distal pulmonary arteries is limited due to respiratory motion and mixing artifact. Consider repeat evaluation or V/Q scan if clinically warranted. 2. Left upper lobe opacity, slightly decreased in size from the prior exam and compatible with treatment changes as compared with recent PET-CT. 3. Enlarging right adrenal mass now measuring 5 cm, concerning for metastatic disease. 4. Aortic atherosclerosis and coronary artery calcifications. Electronically Signed   By: Brett Fairy M.D.   On: 07/09/2022 02:56   DG Chest Port 1 View  Result Date: 07/09/2022 CLINICAL DATA:  Shortness of breath EXAM: PORTABLE CHEST 1 VIEW COMPARISON:  06/16/2022 PET-CT FINDINGS: Cardiac shadow is stable. Mild central vascular congestion is noted. Additionally some post treatment changes are noted in the lingula stable from recent PET-CT. No sizable effusion is noted. No new focal infiltrate is seen. No bony abnormality is noted. IMPRESSION: Mild central vascular congestion.  No new focal infiltrate is seen. Electronically Signed   By: Inez Catalina M.D.   On: 07/09/2022 00:05      Patient Profile     67 y.o. male with past medical history of type II dependent COPD, metastatic lung cancer, history of colon cancer, chronic diastolic congestive heart failure, history of atrial flutter, hypertension admitted with dyspnea (treated for COPD flare, diuresed and also found to have RSV) and by report suffered a cardiac arrest at [facility] requiring defibrillation x 1.  No strips available.  Cardiology now asked to evaluate.  Assessment & Plan    Status postcardiac arrest-as outlined in previous notes patient ventricular fibrillation arrest at [facility].  I do not have rhythm strips for documentation.  However he had been admitted with respiratory distress/hypoxemia felt secondary to a combination of COPD flare, RSV and  diastolic congestive heart failure.  Respiratory distress likely precipitated event.  Will await echocardiogram for LV function.  Troponins minimally elevated but in the setting of defibrillation.  Patient has multiple comorbidities including home O2 dependent COPD and metastatic colon cancer.  As he improves from respiratory issues can proceed with cardiac CTA to rule out obstructive coronary disease though conservative measures likely indicated.   Chronic diastolic congestive heart failure-markedly volume overloaded on examination.  Would resume home dose of Demadex when taking PO. History of atrial flutter-patient is in sinus rhythm today.  Will resume apixaban and bisoprolol when taking PO.  Note CHA2DS2-VASc is at least 3. respiratory failure/RSV/COPD-Per critical care medicine. Acute kidney injury-agree with holding diuretics until patient extubated. Metastatic lung cancer  For questions or updates, please contact [place] Please consult [email or web address] for contact info under        Signed, Kirk Ruths, MD  07/10/2022, 9:39 AM

## 2022-07-11 ENCOUNTER — Telehealth: Payer: Self-pay | Admitting: *Deleted

## 2022-07-11 ENCOUNTER — Telehealth: Payer: Self-pay | Admitting: Cardiology

## 2022-07-11 ENCOUNTER — Ambulatory Visit: Payer: Medicare HMO | Admitting: Radiation Oncology

## 2022-07-11 DIAGNOSIS — J9602 Acute respiratory failure with hypercapnia: Secondary | ICD-10-CM | POA: Diagnosis not present

## 2022-07-11 DIAGNOSIS — Z7189 Other specified counseling: Secondary | ICD-10-CM | POA: Diagnosis not present

## 2022-07-11 DIAGNOSIS — Z515 Encounter for palliative care: Secondary | ICD-10-CM | POA: Diagnosis not present

## 2022-07-11 DIAGNOSIS — J9601 Acute respiratory failure with hypoxia: Secondary | ICD-10-CM | POA: Diagnosis not present

## 2022-07-11 LAB — CBC
HCT: 38.1 % — ABNORMAL LOW (ref 39.0–52.0)
Hemoglobin: 12.6 g/dL — ABNORMAL LOW (ref 13.0–17.0)
MCH: 34.6 pg — ABNORMAL HIGH (ref 26.0–34.0)
MCHC: 33.1 g/dL (ref 30.0–36.0)
MCV: 104.7 fL — ABNORMAL HIGH (ref 80.0–100.0)
Platelets: 140 10*3/uL — ABNORMAL LOW (ref 150–400)
RBC: 3.64 MIL/uL — ABNORMAL LOW (ref 4.22–5.81)
RDW: 14.7 % (ref 11.5–15.5)
WBC: 15.8 10*3/uL — ABNORMAL HIGH (ref 4.0–10.5)
nRBC: 0 % (ref 0.0–0.2)

## 2022-07-11 LAB — MAGNESIUM: Magnesium: 2.2 mg/dL (ref 1.7–2.4)

## 2022-07-11 LAB — POTASSIUM: Potassium: 4.2 mmol/L (ref 3.5–5.1)

## 2022-07-11 LAB — APTT: aPTT: 115 seconds — ABNORMAL HIGH (ref 24–36)

## 2022-07-11 LAB — GLUCOSE, CAPILLARY
Glucose-Capillary: 184 mg/dL — ABNORMAL HIGH (ref 70–99)
Glucose-Capillary: 163 mg/dL — ABNORMAL HIGH (ref 70–99)
Glucose-Capillary: 207 mg/dL — ABNORMAL HIGH (ref 70–99)
Glucose-Capillary: 203 mg/dL — ABNORMAL HIGH (ref 70–99)
Glucose-Capillary: 186 mg/dL — ABNORMAL HIGH (ref 70–99)
Glucose-Capillary: 196 mg/dL — ABNORMAL HIGH (ref 70–99)

## 2022-07-11 LAB — BASIC METABOLIC PANEL
Anion gap: 13 (ref 5–15)
BUN: 41 mg/dL — ABNORMAL HIGH (ref 8–23)
CO2: 34 mmol/L — ABNORMAL HIGH (ref 22–32)
Calcium: 8.8 mg/dL — ABNORMAL LOW (ref 8.9–10.3)
Chloride: 94 mmol/L — ABNORMAL LOW (ref 98–111)
Creatinine, Ser: 1.17 mg/dL (ref 0.61–1.24)
GFR, Estimated: >60 mL/min (ref >60)
Glucose, Bld: 189 mg/dL — ABNORMAL HIGH (ref 70–99)
Potassium: 4.3 mmol/L (ref 3.5–5.1)
Sodium: 141 mmol/L (ref 135–145)

## 2022-07-11 MED ORDER — HEPARIN (PORCINE) 25000 UT/250ML-% IV SOLN
1350.0000 [IU]/h | INTRAVENOUS | Status: DC
  Administered 2022-07-11: 1700 [IU]/h via INTRAVENOUS
  Administered 2022-07-12 (×2): 1450 [IU]/h via INTRAVENOUS
  Filled 2022-07-11 (×3): qty 250

## 2022-07-11 MED ORDER — AMIODARONE HCL IN DEXTROSE 360-4.14 MG/200ML-% IV SOLN
30.0000 mg/h | INTRAVENOUS | Status: DC
  Administered 2022-07-11 – 2022-07-16 (×11): 30 mg/h via INTRAVENOUS
  Filled 2022-07-11 (×12): qty 200

## 2022-07-11 MED ORDER — METOPROLOL TARTRATE 5 MG/5ML IV SOLN
5.0000 mg | Freq: Once | INTRAVENOUS | Status: AC
  Administered 2022-07-11: 5 mg via INTRAVENOUS
  Filled 2022-07-11: qty 5

## 2022-07-11 MED ORDER — ADENOSINE 6 MG/2ML IV SOLN
INTRAVENOUS | Status: AC
  Filled 2022-07-11: qty 2

## 2022-07-11 MED ORDER — AMIODARONE LOAD VIA INFUSION
150.0000 mg | Freq: Once | INTRAVENOUS | Status: AC
  Administered 2022-07-11: 150 mg via INTRAVENOUS
  Filled 2022-07-11: qty 83.34

## 2022-07-11 MED ORDER — METOPROLOL TARTRATE 5 MG/5ML IV SOLN: 5.0000 mg | Freq: Once | INTRAVENOUS | Status: AC

## 2022-07-11 MED ORDER — DILTIAZEM LOAD VIA INFUSION
10.0000 mg | Freq: Once | INTRAVENOUS | Status: AC
  Administered 2022-07-11: 10 mg via INTRAVENOUS
  Filled 2022-07-11: qty 10

## 2022-07-11 MED ORDER — DILTIAZEM HCL-DEXTROSE 125-5 MG/125ML-% IV SOLN (PREMIX)
5.0000 mg/h | INTRAVENOUS | Status: DC
  Administered 2022-07-11: 5 mg/h via INTRAVENOUS
  Administered 2022-07-11: 15 mg/h via INTRAVENOUS
  Administered 2022-07-11 – 2022-07-12 (×2): 20 mg/h via INTRAVENOUS
  Filled 2022-07-11 (×6): qty 125

## 2022-07-11 MED ORDER — METOPROLOL TARTRATE 5 MG/5ML IV SOLN
INTRAVENOUS | Status: AC
  Administered 2022-07-11: 5 mg via INTRAVENOUS
  Filled 2022-07-11: qty 5

## 2022-07-11 MED ORDER — ALBUMIN HUMAN 25 % IV SOLN
25.0000 g | Freq: Four times a day (QID) | INTRAVENOUS | Status: AC
  Administered 2022-07-11 – 2022-07-12 (×4): 25 g via INTRAVENOUS
  Filled 2022-07-11 (×4): qty 100

## 2022-07-11 MED ORDER — AMIODARONE HCL IN DEXTROSE 360-4.14 MG/200ML-% IV SOLN
60.0000 mg/h | INTRAVENOUS | Status: AC
  Administered 2022-07-11 (×2): 60 mg/h via INTRAVENOUS
  Filled 2022-07-11 (×2): qty 200

## 2022-07-11 MED ORDER — FUROSEMIDE 10 MG/ML IJ SOLN
60.0000 mg | Freq: Four times a day (QID) | INTRAMUSCULAR | Status: AC
  Administered 2022-07-11 (×2): 60 mg via INTRAVENOUS
  Filled 2022-07-11 (×2): qty 6

## 2022-07-11 MED ORDER — ADENOSINE 6 MG/2ML IV SOLN
INTRAVENOUS | Status: AC
  Administered 2022-07-11: 6 mg via INTRAVENOUS
  Filled 2022-07-11: qty 4

## 2022-07-11 MED ORDER — ADENOSINE 6 MG/2ML IV SOLN: 6.0000 mg | Freq: Once | INTRAVENOUS | Status: AC

## 2022-07-11 MED ORDER — MAGNESIUM SULFATE 2 GM/50ML IV SOLN
2.0000 g | Freq: Once | INTRAVENOUS | Status: AC
  Administered 2022-07-11: 2 g via INTRAVENOUS
  Filled 2022-07-11: qty 50

## 2022-07-11 MED ORDER — METOLAZONE 5 MG PO TABS: 5.0000 mg | ORAL_TABLET | Freq: Once | ORAL | Status: DC

## 2022-07-11 MED ORDER — MORPHINE SULFATE (PF) 2 MG/ML IV SOLN
4.0000 mg | Freq: Once | INTRAVENOUS | Status: AC
  Administered 2022-07-11: 4 mg via INTRAVENOUS
  Filled 2022-07-11: qty 2

## 2022-07-11 MED ORDER — HEPARIN BOLUS VIA INFUSION
4000.0000 [IU] | Freq: Once | INTRAVENOUS | Status: AC
  Administered 2022-07-11: 4000 [IU] via INTRAVENOUS
  Filled 2022-07-11: qty 4000

## 2022-07-11 NOTE — Progress Notes (Signed)
ANTICOAGULATION CONSULT NOTE - Follow Up Consult  Pharmacy Consult for Heparin Indication: atrial fibrillation  Allergies  Allergen Reactions   Paclitaxel Shortness Of Breath    Hypersensitivity reaction. See progress note dated 09/27/2021.   Penicillins Other (See Comments)    Childhood Allergy  Unknown reaction   Has patient had a PCN reaction causing immediate rash, facial/tongue/throat swelling, SOB or lightheadedness with hypotension: No Has patient had a PCN reaction causing severe rash involving mucus membranes or skin necrosis: No Has patient had a PCN reaction that required hospitalization: No Has patient had a PCN reaction occurring within the last 10 years: No If all of the above answers are "NO", then may proceed with Cephalosporin use.     Patient Measurements: Height: 6\' 1"  (185.4 cm) Weight: 117.8 kg (259 lb 11.2 oz) IBW/kg (Calculated) : 79.9 Heparin Dosing Weight: 108 kg  Vital Signs: Temp: 98.8 F (37.1 C) (02/19 1934) Temp Source: Bladder (02/19 1934) BP: 117/86 (02/19 1900) Pulse Rate: 132 (02/19 1934)  Labs: Recent Labs    07/11/2022 2343 07/15/2022 2347 07/09/22 0559 07/09/22 0746 07/09/22 0753 07/09/22 1250 07/09/22 1450 07/10/22 0859 07/11/22 0318 07/11/22 2230  HGB  --    < > 13.5  --   --   --   --  11.8* 12.6*  --   HCT  --    < > 43.3  --   --   --   --  37.1* 38.1*  --   PLT  --    < > 161  --   --   --   --  142* 140*  --   APTT 27  --   --   --   --   --   --   --   --  115*  LABPROT 15.6*  --   --   --   --   --   --   --   --   --   INR 1.3*  --   --   --   --   --   --   --   --   --   CREATININE  --    < > 1.30*   < >  --  1.44*  --  1.11 1.17  --   TROPONINIHS  --    < >  --   --  73* 118* 156*  --   --   --    < > = values in this interval not displayed.     Estimated Creatinine Clearance: 82.4 mL/min (by C-G formula based on SCr of 1.17 mg/dL).  Assessment: 53 YOM admitted for respiratory failure due to RSV s/p extubation  requiring persistent BiPAP therapy. On Eliquis for atrial fibrillation, last dose 2/18 AM. Pharmacy consulted to dose heparin until patient is able to consistently take oral medications.  aPTT returned at 115, CBC is stable with no signs of bleeding reported   Will monitor with aPTT until heparin levels are correlating.  Goal of Therapy:  Heparin level 0.3-0.7 units/ml aPTT 66-102 seconds Monitor platelets by anticoagulation protocol: Yes   Plan:  Decrease heparin to 1450 units/hr Check 8hr aPTT  Daily CBC and aPTT/heparin level Monitor for signs/symptoms of bleeding  Sandford Craze, PharmD. [facility] Acute Care PGY-1 07/11/2022 11:17 PM

## 2022-07-11 NOTE — Telephone Encounter (Signed)
Patient's wife called to advise Dr. Harl Bowie that patient is in the ICU at this time.  She was calling to find out if Dr. Harl Bowie would want to see patient sooner than his 4/15 appt.

## 2022-07-11 NOTE — Progress Notes (Addendum)
eLink Physician-Brief Progress Note Patient Name: [name] DOB: Aug 15, 1955 MRN: [id number]   Date of Service  07/11/2022  HPI/Events of Note  Called for SVT. Seen on camera. Initial HR ws 180s, going to 200. Went up to 250. No symptoms. SBP 150. No chest pain. On BIPAP with o2 sat 96 on 50% fio2. Gave 6 mg adenosine, quickly converted to A fib down to 130s and then went right back to HR of 170. Given IV metoprolol 5 mg. HR down to 125-130. Repeat BP few times with BP in 140s/150s. Patient very comfortable, pleasant and denying complaints. Reviewed chart and was planned on resuming his Bisoprolol and Eliquis when cleared for swallow. Night dose of Eliquis was held since he has not passed swallow evaluation. Has not been given any rate control medications earlier while he was on vent today .   eICU Interventions  Very good response to metoprolol and stable BP Will send labs including a mag If repeat occurs, will try another dose of metoprolol EKG done and Qtc is slightly prolonged on it. I discontinued the Zofran order.  EF is ok and BP is also ok so if persistent issues occur then we will use diltiazem infusion.      Intervention Category Major Interventions: Arrhythmia - evaluation and management  [name] 07/11/2022, 3:30 AM  Addendum at 3:40 am - went back into rapid A fib quickly. HR in 170s. Given 5 mg IV metoprolol. HR down to 130s but remains in A fib, Dilt drip ordered. SBP in 130s. No other changes. D/w RN  Addendum at 5:40 am - HR in 120s on dltiazem. Looks ok. BP is ok. K and mag also ok. Will need to follow swallow issues in AM. Only missed one dose of Eliquis last night. May need to switch to heparin if unable to swallow.

## 2022-07-11 NOTE — Progress Notes (Signed)
0315, pts bedside monitor alarming for high heart rate. HR noted to initially be around 170's. Pt laying in bed with BiPap. BP and oxygen saturation stable.  While attempting to obtain 12 lead [facility] MD was contacted. At this time pts HR noted to be up to 200-250s. 12 lead unable to be obtained during this time. Pt denied having chest pain, shortness of breath, or feeling of heart racing.    0320: 6mg  Adenosine given per order. Prior to pushing Adenosine, zoll pads and zoll monitor attached to pt and crash cart at bedside. After administering Adenosine, pt HR quickly converted to Afib with rates in the 130-140, BP stable. Unfortunately, HR then trended back up to 170s. AM labs drawn and sent  0323: 5 mg IV Lopressor given per order. HR trended down to 120-130.  0330: Pt heart rate again trending back up to 160s. Another 5 mg of IV Lopressor given per order. Order to start Cardizem drip. Pharmacy called and made aware of stat order.  4332: Cardizem drip started at 5 mg/hr, with 10 mg bolus per order.   Pt updated on plan of care, support given. Pt requested that his wife not be called at this time and it was ok to wait to update her until later in the morning.

## 2022-07-11 NOTE — Progress Notes (Signed)
Palliative:  HPI: 68 y.o. male with medical history significant of atrial flutter, HFpEF, hypertension, colon cancer (in remission), lung cancer (recent adrenal mets plans for rad onc), COPD presents the ED with a chief complaint of dyspnea related to RSV in the setting of lung cancer, COPD, CHF exacerbation. Cardiac arrest 07/09/22 ROSC after 1 round CPR and intubated. Extubated 07/10/22 but still requiring BiPAP support.   I met today at [name]'s bedside but he is being placed back on BiPAP. I returned later and I stepped out of room and spoke with wife, [name]. [name] expresses frustration with her husband's condition and the stress of the circumstances and ICU level care. [name] shares that she knows that he is frustrated and she knows that he is not a patient man and she is worried that he will not continue to be patient to allow time for recovery. She is worried that he may become frustrated with this level of care and want to stop. She does not want him to give up. She understands that he continues to be very ill but she maintains hope for recovery. They have been through many significant illnesses together and the past year has been very difficult. We discussed hopefulness that he will not require re-intubation but also that we are not out of the woods and this is still a possibility.   I allowed [name] space to voice her concerns and frustrations. I encouraged her to try and take care of herself. We discussed plan to continue care, hopeful for improvement, and provide a more peaceful and relaxing environment (as much as possible in ICU). I will continue to follow and support.   All questions/concerns addressed. Emotional support provided.   Exam: Awake, alert on BiPAP. Fatigued. Breathing regular, unlabored. Abd soft. Less edema in legs.   Plan: - Full code, full scope.  - Time for outcomes.  - Ongoing goals of care conversations.  [place], NP Palliative Medicine Team Pager  [phone] (Please see [email or web address] for schedule) Team Phone [phone]    Greater than 50%  of this time was spent counseling and coordinating care related to the above assessment and plan

## 2022-07-11 NOTE — Progress Notes (Signed)
ANTICOAGULATION CONSULT NOTE - Follow Up Consult  Pharmacy Consult for Heparin Indication: atrial fibrillation  Allergies  Allergen Reactions   Paclitaxel Shortness Of Breath    Hypersensitivity reaction. See progress note dated 09/27/2021.   Penicillins Other (See Comments)    Childhood Allergy  Unknown reaction   Has patient had a PCN reaction causing immediate rash, facial/tongue/throat swelling, SOB or lightheadedness with hypotension: No Has patient had a PCN reaction causing severe rash involving mucus membranes or skin necrosis: No Has patient had a PCN reaction that required hospitalization: No Has patient had a PCN reaction occurring within the last 10 years: No If all of the above answers are "NO", then may proceed with Cephalosporin use.     Patient Measurements: Height: 6\' 1"  (185.4 cm) Weight: 117.8 kg (259 lb 11.2 oz) IBW/kg (Calculated) : 79.9 Heparin Dosing Weight: 108 kg  Vital Signs: Temp: 97.6 F (36.4 C) (02/19 1131) Temp Source: Oral (02/19 1131) BP: 117/85 (02/19 1015) Pulse Rate: 135 (02/19 1015)  Labs: Recent Labs    06/25/2022 2343 07/07/2022 2347 07/09/22 0559 07/09/22 0746 07/09/22 0753 07/09/22 1250 07/09/22 1450 07/10/22 0859 07/11/22 0318  HGB  --    < > 13.5  --   --   --   --  11.8* 12.6*  HCT  --    < > 43.3  --   --   --   --  37.1* 38.1*  PLT  --    < > 161  --   --   --   --  142* 140*  APTT 27  --   --   --   --   --   --   --   --   LABPROT 15.6*  --   --   --   --   --   --   --   --   INR 1.3*  --   --   --   --   --   --   --   --   CREATININE  --    < > 1.30*   < >  --  1.44*  --  1.11 1.17  TROPONINIHS  --    < >  --   --  73* 118* 156*  --   --    < > = values in this interval not displayed.    Estimated Creatinine Clearance: 82.4 mL/min (by C-G formula based on SCr of 1.17 mg/dL).  Assessment: 51 YOM admitted for respiratory failure due to RSV s/p extubation requiring persistent BiPAP therapy. On Eliquis for atrial  fibrillation, last dose 2/18 AM. Pharmacy consulted to dose heparin until patient is able to consistently take oral medications.  CBC is stable with no signs of bleeding.  Will monitor with aPTT until heparin levels are correlating.  Goal of Therapy:  Heparin level 0.3-0.7 units/ml aPTT 66-102 seconds Monitor platelets by anticoagulation protocol: Yes   Plan:  Heparin 4000 units IV bolus since it's been >24hr since last Eliquis dose Start heparin 1700 units/hr Check 8hr aPTT at 2200 Daily CBC and aPTT/heparin level Monitor for signs/symptoms of bleeding  Erskine Speed, PharmD Clinical Pharmacist 07/11/2022,1:32 PM

## 2022-07-11 NOTE — Progress Notes (Signed)
NAME:  [name], MRN:  [id number], DOB:  1955-11-18, LOS: 2 ADMISSION DATE:  06/24/2022, CONSULTATION DATE:  07/09/22 REFERRING MD: Vanita Panda CHIEF COMPLAINT:  SOB   History of Present Illness:  67 year old male with pmhx of atrial flutter, CHF, HTN, Lung cancer, Colon Cancer and presented to [facility] with SOB, increased WOB with O2 Sats in the 80s requiring BIPAP. Patient was positive for RSV upon admission. Patient was admitted to the ICU, where at shift change on 2/17 patient developed a cardiac arrest after standing and collapsing. CPR was initiated and performed for 1 round, intubated for airway protection, and found to have a vfib which one shock was given converting to sinus tachycardia. Patient was transferred to [facility] for further management and care.   Pertinent  Medical History   Past Medical History:  Diagnosis Date   (HFpEF) heart failure with preserved ejection fraction ([place])    TTE 06/01/2021: EF 60-65, no RWMA, mild LVH, mild reduced RVSF, moderate pulm hypertension (RVSP 47.3), mild BAE, trivial MR, moderate TR, AV sclerosis, aortic root 41 mm, RAP 15   Atrial flutter (H[facility])    s/p DCCV 06/03/21   Chest pain    Neg nuclear stress test in 2019   Colon cancer ([place]) 04/22/2013   COPD (chronic obstructive pulmonary disease) ([place])    Dilated aortic root ([place])    TTE 05/2021: 4.1 cm   Hypertension    Lung cancer ([place]) 08/26/2021   Renal disorder    Kidney stones     Significant Hospital Events: Including procedures, antibiotic start and stop dates in addition to other pertinent events   2/16 Admitted for respiratory failure requiring BIPAP, + for RSV 2/17 V-fib cardiac arrest, intubated, transferred [facility]   Interim History / Subjective:  No events, more alert today. Having ongoing afib/SVT on dilt.  Objective   Blood pressure 109/88, pulse (!) 130, temperature 99.1 F (37.3 C), resp. rate (!) 23, height 6\' 1"  (1.854 m), weight 117.8 kg, SpO2 95 %. CVP:  [5  mmHg-7 mmHg] 5 mmHg  Vent Mode: BIPAP;PCV FiO2 (%):  [50 %] 50 % Set Rate:  [10 bmp] 10 bmp PEEP:  [6 cmH20-8 cmH20] 8 cmH20 Pressure Support:  [8 cmH20] 8 cmH20   Intake/Output Summary (Last 24 hours) at 07/11/2022 0844 Last data filed at 07/11/2022 0700 Gross per 24 hour  Intake 861.2 ml  Output 4560 ml  Net -3698.8 ml    Filed Weights   06/26/2022 2353 07/10/22 0351 07/11/22 0452  Weight: 126 kg 123.1 kg 117.8 kg    Examination: HR fast Ongoing wheezing slightly better On bipap Ext with lymphedema More awake today, moving ext  BMP tolerating diuresis WBC up slightly  Resolved Hospital Problem list   N/a   Assessment & Plan:  Vfib cardiac arrest- in context of AECOPD and RSV pneumonia;  Acute on chronic hypoxemic and hypercarbic respiratory failure- due to RSV pneumonia.  On CAP coverage.  Extubated 2/18 but tenuous.  Pushing diuresis seems to be helping but he is back in a tachyarrhythmia. Probable type 2 NSTEMI: echo preserved Advanced COPD in flare Stage IV lung cancer mets to adrenal gland Afluter- recurrent, recalcitrant to dilt  - push diuresis: albumin/ metolazone/ lasix - continue nebs, steroids one more day - mag/ amio/ dilt to try to get HR under better control then trial off bipap - Progressive mobility etc - Continue home eliquis - Remain tenuous and high risk for reintubation, wife  updated at length yesterday and would like reintubation if required  Best Practice (right click and "Reselect all SmartList Selections" daily)   Diet/type: will see if can tolerate being off BIPAP DVT prophylaxis: DOAC GI prophylaxis: N/A Lines: keep for now Foley:  Yes, and it is still needed Code Status:  full code Last date of multidisciplinary goals of care discussion family updated at bedside 2/18  35 min cc time Erskine Emery MD PCCM

## 2022-07-11 NOTE — Progress Notes (Signed)
Rounding Note    Patient Name: [name] Date of Encounter: 07/11/2022  [place] Cardiologist: Carlyle Dolly, MD   Subjective   Denies any chest pain.  Currently on BiPAP.  Wife in room.  Shortness of breath persists.  Fatigue.  Inpatient Medications    Scheduled Meds:  arformoterol  15 mcg Nebulization BID   atorvastatin  20 mg Per Tube QPM   budesonide (PULMICORT) nebulizer solution  0.5 mg Nebulization BID   Chlorhexidine Gluconate Cloth  6 each Topical Daily   furosemide  60 mg Intravenous Q6H   heparin  4,000 Units Intravenous Once   insulin aspart  0-15 Units Subcutaneous Q4H   ipratropium-albuterol  3 mL Nebulization Q6H   methylPREDNISolone (SOLU-MEDROL) injection  60 mg Intravenous Q12H   metolazone  5 mg Oral Once   mouth rinse  15 mL Mouth Rinse 4 times per day   pantoprazole (PROTONIX) IV  40 mg Intravenous Q24H   Continuous Infusions:  sodium chloride 10 mL/hr at 07/11/22 1000   albumin human 25 g (07/11/22 1121)   amiodarone 60 mg/hr (07/11/22 1054)   Followed by   amiodarone     azithromycin Stopped (07/11/22 0159)   cefTRIAXone (ROCEPHIN)  IV Stopped (07/11/22 0049)   diltiazem (CARDIZEM) infusion 12.5 mg/hr (07/11/22 1000)   heparin     phenylephrine (NEO-SYNEPHRINE) Adult infusion Stopped (07/10/22 0707)   PRN Meds: acetaminophen **OR** acetaminophen, albuterol, mouth rinse   Vital Signs    Vitals:   07/11/22 1000 07/11/22 1015 07/11/22 1131 07/11/22 1212  BP: 106/88 117/85    Pulse: (!) 135 (!) 135    Resp: (!) 21 (!) 26    Temp: 99.1 F (37.3 C) 98.8 F (37.1 C) 97.6 F (36.4 C)   TempSrc:   Oral   SpO2: 95% 95%  91%  Weight:      Height:        Intake/Output Summary (Last 24 hours) at 07/11/2022 1352 Last data filed at 07/11/2022 1300 Gross per 24 hour  Intake 869 ml  Output 4575 ml  Net -3706 ml      07/11/2022    4:52 AM 07/10/2022    3:51 AM 06/28/2022   11:53 PM  Last 3 Weights  Weight (lbs) 259  lb 11.2 oz 271 lb 6.2 oz 277 lb 12.8 oz  Weight (kg) 117.8 kg 123.1 kg 126.009 kg      Telemetry    Heart rate 131 atrial flutter- Personally Reviewed  ECG    No new- Personally Reviewed  Physical Exam   GEN: No acute distress.  BiPAP Neck: No JVD Cardiac: Tachycardic regular, no murmurs, rubs, or gallops.  Respiratory: Scattered wheezes bilaterally. GI: Soft, nontender, non-distended  MS: No edema; No deformity. Neuro:  Nonfocal  Psych: Normal affect   Labs    High Sensitivity Troponin:   Recent Labs  Lab 07/07/2022 2347 07/09/22 0234 07/09/22 0753 07/09/22 1250 07/09/22 1450  TROPONINIHS 80* 63* 73* 118* 156*     Chemistry Recent Labs  Lab 07/18/2022 2347 07/09/22 0559 07/09/22 0746 07/09/22 1250 07/10/22 0859 07/10/22 2242 07/11/22 0318  NA 132* 137   < > 137 139  --  141  K 5.2* 5.3*   < > 5.1 4.6 4.2 4.3  CL 91* 95*   < > 96* 99  --  94*  CO2 33* 31   < > 30 29  --  34*  GLUCOSE 208* 191*   < > 172*  204*  --  189*  BUN 29* 32*   < > 32* 34*  --  41*  CREATININE 1.08 1.30*   < > 1.44* 1.11  --  1.17  CALCIUM 8.9 8.8*   < > 8.8* 8.7*  --  8.8*  MG  --  2.1  --  2.1  --   --  2.2  PROT 8.3* 7.5  --   --   --   --   --   ALBUMIN 4.1 3.7  --   --   --   --   --   AST 33 29  --   --   --   --   --   ALT 38 37  --   --   --   --   --   ALKPHOS 108 101  --   --   --   --   --   BILITOT 1.0 0.9  --   --   --   --   --   GFRNONAA >60 >60   < > 53* >60  --  >60  ANIONGAP 8 11   < > 11 11  --  13   < > = values in this interval not displayed.    Lipids No results for input(s): "CHOL", "TRIG", "HDL", "LABVLDL", "LDLCALC", "CHOLHDL" in the last 168 hours.  Hematology Recent Labs  Lab 07/09/22 0559 07/10/22 0859 07/11/22 0318  WBC 19.4* 11.7* 15.8*  RBC 4.02* 3.50* 3.64*  HGB 13.5 11.8* 12.6*  HCT 43.3 37.1* 38.1*  MCV 107.7* 106.0* 104.7*  MCH 33.6 33.7 34.6*  MCHC 31.2 31.8 33.1  RDW 14.6 14.7 14.7  PLT 161 142* 140*   Thyroid No results for  input(s): "TSH", "FREET4" in the last 168 hours.  BNP Recent Labs  Lab 06/30/2022 2347  BNP 438.0*    DDimer No results for input(s): "DDIMER" in the last 168 hours.   Radiology    ECHOCARDIOGRAM COMPLETE  Result Date: 07/10/2022    ECHOCARDIOGRAM REPORT   Patient Name:   [name] Date of Exam: 07/09/2022 Medical Rec #:  [id number]          Height:       73.0 in Accession #:    [id number]         Weight:       277.8 lb Date of Birth:  [birth year]           BSA:          2.474 m Patient Age:    67 years           BP:           104/61 mmHg Patient Gender: M                  HR:           94 bpm. Exam Location:  Inpatient Procedure: 2D Echo and Intracardiac Opacification Agent Indications:    elevated troponin  History:        Patient has prior history of Echocardiogram examinations, most                 recent 06/01/2021. COPD and Stroke; Risk Factors:Diabetes and                 Hypertension.  Sonographer:    Harvie Junior Referring Phys: [place]  1. Left ventricular ejection fraction, by estimation, is 60 to 65%. The  left ventricle has normal function. The left ventricle has no regional wall motion abnormalities. There is mild concentric left ventricular hypertrophy. Left ventricular diastolic parameters are consistent with Grade II diastolic dysfunction (pseudonormalization).  2. Right ventricular systolic function is normal. The right ventricular size is normal. There is normal pulmonary artery systolic pressure.  3. The mitral valve is normal in structure. Trivial mitral valve regurgitation. No evidence of mitral stenosis.  4. The aortic valve is calcified. Aortic valve regurgitation is not visualized. No aortic stenosis is present.  5. Aortic dilatation noted. There is mild dilatation of the aortic root, measuring 42 mm.  6. The inferior vena cava is normal in size with greater than 50% respiratory variability, suggesting right atrial pressure of 3 mmHg. FINDINGS  Left  Ventricle: Left ventricular ejection fraction, by estimation, is 60 to 65%. The left ventricle has normal function. The left ventricle has no regional wall motion abnormalities. Definity contrast agent was given IV to delineate the left ventricular  endocardial borders. The left ventricular internal cavity size was normal in size. There is mild concentric left ventricular hypertrophy. Left ventricular diastolic parameters are consistent with Grade II diastolic dysfunction (pseudonormalization). Right Ventricle: The right ventricular size is normal. Right ventricular systolic function is normal. There is normal pulmonary artery systolic pressure. The tricuspid regurgitant velocity is 2.67 m/s, and with an assumed right atrial pressure of 3 mmHg,  the estimated right ventricular systolic pressure is 36.4 mmHg. Left Atrium: Left atrial size was normal in size. Right Atrium: Right atrial size was normal in size. Pericardium: There is no evidence of pericardial effusion. Mitral Valve: The mitral valve is normal in structure. Mild mitral annular calcification. Trivial mitral valve regurgitation. No evidence of mitral valve stenosis. Tricuspid Valve: The tricuspid valve is normal in structure. Tricuspid valve regurgitation is trivial. No evidence of tricuspid stenosis. Aortic Valve: The aortic valve is calcified. Aortic valve regurgitation is not visualized. No aortic stenosis is present. Aortic valve mean gradient measures 4.0 mmHg. Aortic valve peak gradient measures 6.8 mmHg. Aortic valve area, by VTI measures 4.18 cm. Pulmonic Valve: The pulmonic valve was not well visualized. Pulmonic valve regurgitation is trivial. No evidence of pulmonic stenosis. Aorta: Aortic dilatation noted. There is mild dilatation of the aortic root, measuring 42 mm. Venous: The inferior vena cava is normal in size with greater than 50% respiratory variability, suggesting right atrial pressure of 3 mmHg. IAS/Shunts: The interatrial septum was  not well visualized.  LEFT VENTRICLE PLAX 2D LVIDd:         3.80 cm      Diastology LVIDs:         2.80 cm      LV e' medial:    7.18 cm/s LV PW:         1.10 cm      LV E/e' medial:  9.2 LV IVS:        1.10 cm      LV e' lateral:   5.87 cm/s LVOT diam:     2.50 cm      LV E/e' lateral: 11.2 LV SV:         87 LV SV Index:   35 LVOT Area:     4.91 cm  LV Volumes (MOD) LV vol d, MOD A2C: 128.0 ml LV vol d, MOD A4C: 95.7 ml LV vol s, MOD A2C: 55.4 ml LV vol s, MOD A4C: 38.7 ml LV SV MOD A2C:     72.6 ml LV SV  MOD A4C:     95.7 ml LV SV MOD BP:      66.7 ml RIGHT VENTRICLE RV Basal diam:  4.10 cm RV Mid diam:    3.35 cm RV S prime:     13.30 cm/s LEFT ATRIUM           Index        RIGHT ATRIUM           Index LA Vol (A4C): 54.5 ml 22.03 ml/m  RA Area:     19.00 cm                                    RA Volume:   49.40 ml  19.97 ml/m  AORTIC VALVE                    PULMONIC VALVE AV Area (Vmax):    4.15 cm     PV Vmax:       0.94 m/s AV Area (Vmean):   3.89 cm     PV Peak grad:  3.6 mmHg AV Area (VTI):     4.18 cm AV Vmax:           130.00 cm/s AV Vmean:          88.000 cm/s AV VTI:            0.209 m AV Peak Grad:      6.8 mmHg AV Mean Grad:      4.0 mmHg LVOT Vmax:         110.00 cm/s LVOT Vmean:        69.700 cm/s LVOT VTI:          0.178 m LVOT/AV VTI ratio: 0.85  AORTA Ao Root diam: 4.20 cm Ao Asc diam:  3.70 cm MITRAL VALVE               TRICUSPID VALVE MV Area (PHT): 3.65 cm    TR Peak grad:   28.5 mmHg MV Decel Time: 208 msec    TR Vmax:        267.00 cm/s MR Peak grad: 27.7 mmHg MR Vmax:      263.00 cm/s  SHUNTS MV E velocity: 65.70 cm/s  Systemic VTI:  0.18 m MV A velocity: 48.40 cm/s  Systemic Diam: 2.50 cm MV E/A ratio:  1.36 Kirk Ruths MD Electronically signed by Kirk Ruths MD Signature Date/Time: 07/10/2022/11:13:52 AM    Final    DG CHEST PORT 1 VIEW  Result Date: 07/09/2022 CLINICAL DATA:  Central line placement EXAM: PORTABLE CHEST - 1 VIEW COMPARISON:  07/09/2022 FINDINGS: Unchanged  cardiomegaly and mild pulmonary vascular congestion. Airspace opacity within the medial lung bases bilaterally similar to prior examination. NG tube terminates in the left upper quadrant. Endotracheal tube tip is located approximately 2 cm of the carina. Newly placed right IJ central venous catheter terminates in the region of the superior vena cava. There is no pneumothorax. IMPRESSION: 1. Newly placed right IJ central venous catheter terminates in the region of the superior vena cava. 2. Unchanged cardiomegaly and bibasilar airspace opacities. Electronically Signed   By: Miachel Roux M.D.   On: 07/09/2022 15:44     Patient Profile     66 y.o. male currently on BiPAP with RSV pneumonia acute on chronic hypoxic and hypercarbic respiratory failure with type II myocardial infarction, persistent atrial fibrillation/flutter  Assessment & Plan  Atrial fibrillation/flutter persistent - Difficult to rate control.  Currently 131 on telemetry.  Likely 2-1 flutter.  Continue with IV amiodarone drip.  Also continue with IV diltiazem currently at full dose of 15 mg/h.  As he continues to heal from a respiratory perspective heart rate should improve as well.  I do not feel like he would benefit at this time from cardioversion.  Ventricular fibrillatory cardiac arrest - This was in the context of respiratory failure.  Mildly elevated troponin. -Continue with medical management.  Given his underlying metastatic lung cancer, would not be a candidate for invasive therapies.  Type 2 MI -Supply/demand mismatch in the setting of respiratory failure and brief cardiac arrest.  This does increase his overall morbidity/mortality.  Metastatic lung cancer -Per oncology.   For questions or updates, please contact [place] Please consult [email or web address] for contact info under        Signed, Candee Furbish, MD  07/11/2022, 1:52 PM

## 2022-07-12 ENCOUNTER — Ambulatory Visit: Payer: Medicare HMO | Admitting: Radiation Oncology

## 2022-07-12 DIAGNOSIS — J129 Viral pneumonia, unspecified: Secondary | ICD-10-CM | POA: Diagnosis not present

## 2022-07-12 DIAGNOSIS — Z7189 Other specified counseling: Secondary | ICD-10-CM | POA: Diagnosis not present

## 2022-07-12 DIAGNOSIS — J9601 Acute respiratory failure with hypoxia: Secondary | ICD-10-CM | POA: Diagnosis not present

## 2022-07-12 DIAGNOSIS — J9602 Acute respiratory failure with hypercapnia: Secondary | ICD-10-CM | POA: Diagnosis not present

## 2022-07-12 DIAGNOSIS — Z515 Encounter for palliative care: Secondary | ICD-10-CM | POA: Diagnosis not present

## 2022-07-12 LAB — BASIC METABOLIC PANEL
Anion gap: 10 (ref 5–15)
BUN: 44 mg/dL — ABNORMAL HIGH (ref 8–23)
CO2: 40 mmol/L — ABNORMAL HIGH (ref 22–32)
Calcium: 9.2 mg/dL (ref 8.9–10.3)
Chloride: 94 mmol/L — ABNORMAL LOW (ref 98–111)
Creatinine, Ser: 1.19 mg/dL (ref 0.61–1.24)
GFR, Estimated: >60 mL/min (ref >60)
Glucose, Bld: 221 mg/dL — ABNORMAL HIGH (ref 70–99)
Potassium: 3.9 mmol/L (ref 3.5–5.1)
Sodium: 144 mmol/L (ref 135–145)

## 2022-07-12 LAB — CBC
HCT: 37.3 % — ABNORMAL LOW (ref 39.0–52.0)
Hemoglobin: 11.7 g/dL — ABNORMAL LOW (ref 13.0–17.0)
MCH: 33.6 pg (ref 26.0–34.0)
MCHC: 31.4 g/dL (ref 30.0–36.0)
MCV: 107.2 fL — ABNORMAL HIGH (ref 80.0–100.0)
Platelets: 143 10*3/uL — ABNORMAL LOW (ref 150–400)
RBC: 3.48 MIL/uL — ABNORMAL LOW (ref 4.22–5.81)
RDW: 14.6 % (ref 11.5–15.5)
WBC: 13.6 10*3/uL — ABNORMAL HIGH (ref 4.0–10.5)
nRBC: 0 % (ref 0.0–0.2)

## 2022-07-12 LAB — APTT
aPTT: 112 seconds — ABNORMAL HIGH (ref 24–36)
aPTT: 86 seconds — ABNORMAL HIGH (ref 24–36)

## 2022-07-12 LAB — PHOSPHORUS: Phosphorus: 3.7 mg/dL (ref 2.5–4.6)

## 2022-07-12 LAB — HEPARIN LEVEL (UNFRACTIONATED): Heparin Unfractionated: >1.1 IU/mL — ABNORMAL HIGH (ref 0.30–0.70)

## 2022-07-12 LAB — GLUCOSE, CAPILLARY
Glucose-Capillary: 173 mg/dL — ABNORMAL HIGH (ref 70–99)
Glucose-Capillary: 173 mg/dL — ABNORMAL HIGH (ref 70–99)
Glucose-Capillary: 186 mg/dL — ABNORMAL HIGH (ref 70–99)
Glucose-Capillary: 194 mg/dL — ABNORMAL HIGH (ref 70–99)
Glucose-Capillary: 210 mg/dL — ABNORMAL HIGH (ref 70–99)
Glucose-Capillary: 210 mg/dL — ABNORMAL HIGH (ref 70–99)

## 2022-07-12 LAB — MAGNESIUM: Magnesium: 2.7 mg/dL — ABNORMAL HIGH (ref 1.7–2.4)

## 2022-07-12 MED ORDER — INSULIN DETEMIR 100 UNIT/ML ~~LOC~~ SOLN
5.0000 [IU] | Freq: Two times a day (BID) | SUBCUTANEOUS | Status: DC
  Administered 2022-07-12 – 2022-07-14 (×5): 5 [IU] via SUBCUTANEOUS
  Filled 2022-07-12 (×6): qty 0.05

## 2022-07-12 MED ORDER — ALBUMIN HUMAN 25 % IV SOLN
25.0000 g | Freq: Three times a day (TID) | INTRAVENOUS | Status: AC
  Administered 2022-07-12 (×2): 25 g via INTRAVENOUS
  Filled 2022-07-12 (×2): qty 100

## 2022-07-12 MED ORDER — FUROSEMIDE 10 MG/ML IJ SOLN
60.0000 mg | Freq: Three times a day (TID) | INTRAMUSCULAR | Status: AC
  Administered 2022-07-12 (×2): 60 mg via INTRAVENOUS
  Filled 2022-07-12 (×2): qty 6

## 2022-07-12 MED ORDER — POTASSIUM CHLORIDE 10 MEQ/100ML IV SOLN
10.0000 meq | INTRAVENOUS | Status: AC
  Administered 2022-07-12 (×3): 10 meq via INTRAVENOUS
  Filled 2022-07-12 (×3): qty 100

## 2022-07-12 MED ORDER — STERILE WATER FOR INJECTION IJ SOLN
INTRAMUSCULAR | Status: AC
  Administered 2022-07-12: 5 mL
  Filled 2022-07-12: qty 10

## 2022-07-12 MED ORDER — METOPROLOL TARTRATE 5 MG/5ML IV SOLN
5.0000 mg | Freq: Four times a day (QID) | INTRAVENOUS | Status: DC
  Administered 2022-07-12 – 2022-07-13 (×5): 5 mg via INTRAVENOUS
  Filled 2022-07-12 (×5): qty 5

## 2022-07-12 MED ORDER — ACETAZOLAMIDE SODIUM 500 MG IJ SOLR
500.0000 mg | Freq: Once | INTRAMUSCULAR | Status: AC
  Administered 2022-07-12: 500 mg via INTRAVENOUS
  Filled 2022-07-12: qty 500

## 2022-07-12 MED ORDER — POTASSIUM CHLORIDE 10 MEQ/100ML IV SOLN
10.0000 meq | INTRAVENOUS | Status: DC
  Administered 2022-07-12: 10 meq via INTRAVENOUS
  Filled 2022-07-12: qty 100

## 2022-07-12 NOTE — Telephone Encounter (Signed)
Left a message for pt's wife to call office back regarding appointment.

## 2022-07-12 NOTE — Progress Notes (Signed)
Palliative:  HPI: 67 y.o. male with medical history significant of atrial flutter, HFpEF, hypertension, colon cancer (in remission), lung cancer (recent adrenal mets plans for rad onc), COPD presents the ED with a chief complaint of dyspnea related to RSV in the setting of lung cancer, COPD, CHF exacerbation. Cardiac arrest 07/09/22 ROSC after 1 round CPR and intubated. Extubated 07/10/22 but still requiring BiPAP support.     I met today with [name] and wife [name] at bedside. I discussed with bedside RN and PCCM. I discussed with [name] and [name] plans for transition to [facility] if able and Cortrak placement to provide nutrition. They both report better and more calm day today. They agree with plan forward. [name] continues to be fatigued on BiPAP. Difficult to have a conversation through BiPAP - hopefully he can tolerate HFNC to have improved goals of care conversation. Continue current care at this time. They are still hopeful for improvement.   All questions/concerns addressed. Emotional support provided.   Exam: Alert, oriented. On BiPAP. Poor respiratory reserve. HR irregular 130s. Abd soft. BLE edematous but improved.   Plan: - Full code, full scope.  - Ongoing goals of care discussions.   25 min  Yong Channel, NP Palliative Medicine Team Pager [phone] (Please see [email or web address] for schedule) Team Phone [phone]    Greater than 50%  of this time was spent counseling and coordinating care related to the above assessment and plan

## 2022-07-12 NOTE — Progress Notes (Signed)
ANTICOAGULATION CONSULT NOTE - Follow Up Consult  Pharmacy Consult for Heparin Indication: atrial fibrillation  Allergies  Allergen Reactions   Paclitaxel Shortness Of Breath    Hypersensitivity reaction. See progress note dated 09/27/2021.   Penicillins Other (See Comments)    Childhood Allergy  Unknown reaction   Has patient had a PCN reaction causing immediate rash, facial/tongue/throat swelling, SOB or lightheadedness with hypotension: No Has patient had a PCN reaction causing severe rash involving mucus membranes or skin necrosis: No Has patient had a PCN reaction that required hospitalization: No Has patient had a PCN reaction occurring within the last 10 years: No If all of the above answers are "NO", then may proceed with Cephalosporin use.     Patient Measurements: Height: 6\' 1"  (185.4 cm) Weight: 112.5 kg (248 lb 0.3 oz) IBW/kg (Calculated) : 79.9 Heparin Dosing Weight: 108 kg  Vital Signs: Temp: 98.4 F (36.9 C) (02/20 1034) Temp Source: Bladder (02/20 0750) BP: 123/89 (02/20 1034) Pulse Rate: 134 (02/20 1034)  Labs: Recent Labs    07/09/22 1250 07/09/22 1250 07/09/22 1450 07/10/22 0859 07/11/22 0318 07/11/22 2230 07/12/22 0336 07/12/22 1002  HGB  --    < >  --  11.8* 12.6*  --  11.7*  --   HCT  --   --   --  37.1* 38.1*  --  37.3*  --   PLT  --   --   --  142* 140*  --  143*  --   APTT  --   --   --   --   --  115* 112* 86*  HEPARINUNFRC  --   --   --   --   --   --  >1.10*  --   CREATININE 1.44*  --   --  1.11 1.17  --  1.19  --   TROPONINIHS 118*  --  156*  --   --   --   --   --    < > = values in this interval not displayed.     Estimated Creatinine Clearance: 79.2 mL/min (by C-G formula based on SCr of 1.19 mg/dL).  Assessment: 41 YOM admitted for respiratory failure due to RSV s/p extubation requiring persistent BiPAP therapy. On Eliquis for atrial fibrillation, last dose 2/18 AM. Pharmacy consulted to dose heparin until patient is able to  consistently take oral medications.  aPTT 86 (therapeutic) on heparin 1450 units/hr. Heparin level remains >1.1 as expected given recent Eliquis dose. CBC is stable with no signs of bleeding reported.  Will monitor with aPTT until heparin levels are correlating.  Goal of Therapy:  Heparin level 0.3-0.7 units/ml aPTT 66-102 seconds Monitor platelets by anticoagulation protocol: Yes   Plan:  Continue heparin 1450 units/hr Daily CBC and aPTT/heparin level Monitor for signs/symptoms of bleeding  Erskine Speed, PharmD Clinical Pharmacist 07/12/2022 11:05 AM

## 2022-07-12 NOTE — Progress Notes (Signed)
This RN noticed the patient's left pupil is noticeably larger than right pupil. Pupils are both reactive to light and patient has had not mental status change or focal deficit noted. MD and NP made aware with orders to continue monitoring for change in mental status change or focal deficit. Will pass on this finding to night shift during shift report.

## 2022-07-12 NOTE — Progress Notes (Addendum)
Rounding Note    Patient Name: [name] Date of Encounter: 07/12/2022  [place] Cardiologist: Carlyle Dolly, MD   Subjective   In bed, no chest pain. [place] high flow. Wife at bedside  Inpatient Medications    Scheduled Meds:  arformoterol  15 mcg Nebulization BID   atorvastatin  20 mg Per Tube QPM   budesonide (PULMICORT) nebulizer solution  0.5 mg Nebulization BID   Chlorhexidine Gluconate Cloth  6 each Topical Daily   furosemide  60 mg Intravenous Q8H   insulin aspart  0-15 Units Subcutaneous Q4H   insulin detemir  5 Units Subcutaneous BID   ipratropium-albuterol  3 mL Nebulization Q6H   methylPREDNISolone (SOLU-MEDROL) injection  60 mg Intravenous Q12H   metoprolol tartrate  5 mg Intravenous Q6H   mouth rinse  15 mL Mouth Rinse 4 times per day   pantoprazole (PROTONIX) IV  40 mg Intravenous Q24H   Continuous Infusions:  sodium chloride Stopped (07/12/22 0835)   albumin human 25 g (07/12/22 1107)   amiodarone 30 mg/hr (07/12/22 0900)   azithromycin Stopped (07/12/22 0055)   cefTRIAXone (ROCEPHIN)  IV Stopped (07/11/22 2343)   heparin 1,450 Units/hr (07/12/22 0900)   potassium chloride 10 mEq (07/12/22 1113)   PRN Meds: acetaminophen **OR** acetaminophen, albuterol, mouth rinse   Vital Signs    Vitals:   07/12/22 0915 07/12/22 0930 07/12/22 1034 07/12/22 1127  BP: 135/81 (!) 140/91 123/89   Pulse: (!) 130 (!) 131 (!) 134   Resp: (!) 21 (!) 26 (!) 26   Temp: 98.1 F (36.7 C) 98.2 F (36.8 C) 98.4 F (36.9 C) 98.7 F (37.1 C)  TempSrc:    Oral  SpO2: 90% 95% 94%   Weight:      Height:        Intake/Output Summary (Last 24 hours) at 07/12/2022 1135 Last data filed at 07/12/2022 0900 Gross per 24 hour  Intake 2194.78 ml  Output 3500 ml  Net -1305.22 ml      07/12/2022    4:40 AM 07/11/2022    4:52 AM 07/10/2022    3:51 AM  Last 3 Weights  Weight (lbs) 248 lb 0.3 oz 259 lb 11.2 oz 271 lb 6.2 oz  Weight (kg) 112.5 kg 117.8 kg  123.1 kg      Telemetry    AFLUTTER 132 - Personally Reviewed  ECG    No new - Personally Reviewed  Physical Exam   GEN: Weak, on BiPAP.   Neck: No JVD Cardiac: Tachycardic regular, no murmurs, rubs, or gallops.  Respiratory: Increased respiratory effort GI: Soft, nontender, non-distended  MS: 3+ edema; No deformity. Neuro:  Nonfocal  Psych: Normal affect   Labs    High Sensitivity Troponin:   Recent Labs  Lab 07/06/2022 2347 07/09/22 0234 07/09/22 0753 07/09/22 1250 07/09/22 1450  TROPONINIHS 80* 63* 73* 118* 156*     Chemistry Recent Labs  Lab 07/15/2022 2347 06/26/2022 2347 07/09/22 0559 07/09/22 0746 07/09/22 1250 07/10/22 0859 07/10/22 2242 07/11/22 0318 07/11/22 1945 07/12/22 0336  NA 132*  --  137   < > 137 139  --  141  --  144  K 5.2*  --  5.3*   < > 5.1 4.6   < > 4.3 4.2 3.9  CL 91*  --  95*   < > 96* 99  --  94*  --  94*  CO2 33*  --  31   < > 30 29  --  34*  --  40*  GLUCOSE 208*  --  191*   < > 172* 204*  --  189*  --  221*  BUN 29*  --  32*   < > 32* 34*  --  41*  --  44*  CREATININE 1.08  --  1.30*   < > 1.44* 1.11  --  1.17  --  1.19  CALCIUM 8.9  --  8.8*   < > 8.8* 8.7*  --  8.8*  --  9.2  MG  --    < > 2.1  --  2.1  --   --  2.2  --  2.7*  PROT 8.3*  --  7.5  --   --   --   --   --   --   --   ALBUMIN 4.1  --  3.7  --   --   --   --   --   --   --   AST 33  --  29  --   --   --   --   --   --   --   ALT 38  --  37  --   --   --   --   --   --   --   ALKPHOS 108  --  101  --   --   --   --   --   --   --   BILITOT 1.0  --  0.9  --   --   --   --   --   --   --   GFRNONAA >60  --  >60   < > 53* >60  --  >60  --  >60  ANIONGAP 8  --  11   < > 11 11  --  13  --  10   < > = values in this interval not displayed.    Lipids No results for input(s): "CHOL", "TRIG", "HDL", "LABVLDL", "LDLCALC", "CHOLHDL" in the last 168 hours.  Hematology Recent Labs  Lab 07/10/22 0859 07/11/22 0318 07/12/22 0336  WBC 11.7* 15.8* 13.6*  RBC 3.50* 3.64*  3.48*  HGB 11.8* 12.6* 11.7*  HCT 37.1* 38.1* 37.3*  MCV 106.0* 104.7* 107.2*  MCH 33.7 34.6* 33.6  MCHC 31.8 33.1 31.4  RDW 14.7 14.7 14.6  PLT 142* 140* 143*   Thyroid No results for input(s): "TSH", "FREET4" in the last 168 hours.  BNP Recent Labs  Lab 07/03/2022 2347  BNP 438.0*    DDimer No results for input(s): "DDIMER" in the last 168 hours.   Radiology    No results found.  Cardiac Studies   Echocardiogram 07/09/2022:   1. Left ventricular ejection fraction, by estimation, is 60 to 65%. The  left ventricle has normal function. The left ventricle has no regional  wall motion abnormalities. There is mild concentric left ventricular  hypertrophy. Left ventricular diastolic  parameters are consistent with Grade II diastolic dysfunction  (pseudonormalization).   2. Right ventricular systolic function is normal. The right ventricular  size is normal. There is normal pulmonary artery systolic pressure.   3. The mitral valve is normal in structure. Trivial mitral valve  regurgitation. No evidence of mitral stenosis.   4. The aortic valve is calcified. Aortic valve regurgitation is not  visualized. No aortic stenosis is present.   5. Aortic dilatation noted. There is mild dilatation of the aortic root,  measuring 42 mm.  6. The inferior vena cava is normal in size with greater than 50%  respiratory variability, suggesting right atrial pressure of 3 mmHg.    Patient Profile     67 y.o. male with V-fib cardiac arrest in the setting of RSV pneumonia with respiratory failure, type II MI, advanced COPD stage IV lung cancer with mets to the adrenal gland with difficult to control atrial fib/flutter  Assessment & Plan    Atrial fibrillation/atrial flutter - Heart rate fairly incessant in the 130s.  Challenging to control.  On AMIO. Agree with starting IV Lopressor 5 mg every 6 hours.  Okay to stop the diltiazem since it was not touching the tachycardia.  Challenging to  control heart rate is fairly common with incessant atrial flutter.  He would be high risk for TEE prior to cardioversion at this time.  Type II MI -Likely supply/demand mismatch in the setting of respiratory failure/brief cardiac arrest requiring CPR.  Metastatic lung cancer - Adrenal gland metastasis Oncology.    Contraction alkalosis. Getting IV lasix 60 Q8.  CRITICAL CARE Performed by: Candee Furbish   Total critical care time: 35 minutes  Critical care time was exclusive of separately billable procedures and treating other patients.  Critical care was necessary to treat or prevent imminent or life-threatening deterioration.  Critical care was time spent personally by me on the following activities: development of treatment plan with patient and/or surrogate as well as nursing, discussions with consultants, evaluation of patient's response to treatment, examination of patient, obtaining history from patient or surrogate, ordering and performing treatments and interventions, ordering and review of laboratory studies, ordering and review of radiographic studies, pulse oximetry and re-evaluation of patient's condition.   For questions or updates, please contact [place] Please consult [email or web address] for contact info under        Signed, Candee Furbish, MD  07/12/2022, 11:35 AM

## 2022-07-12 NOTE — Telephone Encounter (Signed)
  Patient's wife just wanted to inform Dr [name] that patient is currently at [facility] for respiratory issues. Nothing further needed at this time.

## 2022-07-12 NOTE — Progress Notes (Addendum)
NAME:  [name], MRN:  [id number], DOB:  [birth year], LOS: 3 ADMISSION DATE:  07/07/2022, CONSULTATION DATE:  07/09/22 REFERRING MD: Vanita Panda CHIEF COMPLAINT:  SOB   History of Present Illness:  67 year old male with pmhx of atrial flutter, HFpEF, HTN, Lung cancer, Colon Cancer and presented to [facility] with SOB, increased WOB with O2 Sats in the 80s requiring BIPAP. Patient was positive for RSV upon admission. Patient was admitted to the ICU, where at shift change on 2/17 patient developed a cardiac arrest after standing and collapsing. CPR was initiated and performed for 1 round, intubated for airway protection, and found to have a vfib which one shock was given converting to sinus tachycardia. Patient was transferred to [facility] for further management and care.   Pertinent  Medical History   Past Medical History:  Diagnosis Date   (HFpEF) heart failure with preserved ejection fraction ([place])    TTE 06/01/2021: EF 60-65, no RWMA, mild LVH, mild reduced RVSF, moderate pulm hypertension (RVSP 47.3), mild BAE, trivial MR, moderate TR, AV sclerosis, aortic root 41 mm, RAP 15   Atrial flutter (HCC)    s/p DCCV 06/03/21   Chest pain    Neg nuclear stress test in 2019   Colon cancer ([place]) 04/22/2013   COPD (chronic obstructive pulmonary disease) ([place])    Dilated aortic root ([place])    TTE 05/2021: 4.1 cm   Hypertension    Lung cancer ([place]) 08/26/2021   Renal disorder    Kidney stones     Significant Hospital Events: Including procedures, antibiotic start and stop dates in addition to other pertinent events   2/16 Admitted for respiratory failure requiring BIPAP, + for RSV 2/17 V-fib cardiac arrest, intubated, transferred from [facility] to [facility]  2/18 extubated/ bipap  2/19 ongoing afib/ SVT on dilt  Interim History / Subjective:  Remains on BiPAP, no complaints of SOB or nausea.  Has chest soreness.   HR remains 130s on amio and dilt gtt  Objective   Blood pressure 125/81, pulse (!) 131, temperature  98.4 F (36.9 C), temperature source Bladder, resp. rate (!) 24, height 6\' 1"  (1.854 m), weight 112.5 kg, SpO2 94 %. CVP:  [6 mmHg-13 mmHg] 7 mmHg  Vent Mode: BIPAP;PCV FiO2 (%):  [40 %] 40 % Set Rate:  [10 bmp] 10 bmp PEEP:  [8 cmH20] 8 cmH20   Intake/Output Summary (Last 24 hours) at 07/12/2022 0848 Last data filed at 07/12/2022 0708 Gross per 24 hour  Intake 2059.71 ml  Output 3925 ml  Net -1865.29 ml   Filed Weights   07/10/22 0351 07/11/22 0452 07/12/22 0440  Weight: 123.1 kg 117.8 kg 112.5 kg    Examination: General:  Chronically ill appearing male sitting upright in bed in NAD HEENT: MM pink/dry, pupils 3/reactive, full face mask, nasal bridge concerning for start of pressure injury Neuro: Awake, alert, f/c, appropriate, MAE CV: rr ir, appears flutter 130s vs ST, +dp, R IJ CVL PULM:  BiPAP, was 20/8 with TVs > 800, dropped to 16/8 with TV's in the 600s, coarse throughout, faint distant wheeze on right, diminished bases  GI: obese, +bs, soft, NT, foley  Extremities: warm/dry, ankle/ pedal pitting edema, L foot > R foot Skin: no rashes  CVP 7 Afebrile UOP 3.7L/ 24hs -1.6L Net -4.7L Wts downtrending CBGs> 160> 210s Labs reviewed K 3.9, BUN> 34> 41> 44, sCr stable 1.19, mag 2.7, WBC 15.8> 13.6, H/H stable, plts 143 stable  2/17 Bcx2> ngtd  2/17  MRSA PCR > neg 2/16 azithro/ ctx >   Resolved Hospital Problem list   N/a   Assessment & Plan:  Vfib cardiac arrest- in context of AECOPD and RSV pneumonia;  Acute on chronic hypoxemic and hypercarbic respiratory failure- due to RSV pneumonia.  On CAP coverage.  Extubated 2/18 but tenuous.  Pushing diuresis seems to be helping but he is back in a tachyarrhythmia. Probable type 2 NSTEMI: echo preserved Advanced COPD in flare Stage IV lung cancer mets to adrenal gland Afluter/ Afib- recurrent, recalcitrant to dilt P:  - cont diuresis, starting to get a contraction alkalosis, sCr stable,  unable to receive metalozone  yesterday as remains NPO.  Continue lasix/ albumin x 2 with IV acetazolamide  - ongoing KCL/ mag replete prn for goal K> 4, Mag > 2 - cont to trend CVP/ daily wts/ strict I/Os - appreciate cardiology input.  Remains in aflutter, rates 130 on IV amio and IV diltz.  Not a candidate for DCCV at this time, feels HR will improve as respiratory status improves. Discussed with Dr. Tacy Learn will start lopressor IV 5mg  q6 given good rate response 2/19 am, stop diltz and monitor.   - heparin per pharmacy until more stable from respiratory standpoint to take PO's consistently> then switch to Eliquis  - remains on BiPAP.  Attempted salter, immediate increase in WOB.  Will trial HHFNC as bridge.  Remains tenuous and high risk for intubation.  Remains full code.   - NPO.  If unable to tolerate HHFNC, stable to take some PO's, will need cortrak (post-pyloric given ongoing BiPAP needs) and address nutritional needs, risk for malnutrition - goal SpO2 88-94%, not unexpected to have some brief desaturations with exertion.  Will likely be a slow recovery from RSV/ AECOPD/ CAP.   - continue nebs  - decrease solumedrol 60mg  BID> daily  - cont SSI prn, adding levemir for goal glucose 140-180 - PT when more stable from respiratory/ cardiac standpoint - appreciate ongoing PMT assistance with GOC - pt's SBRT treatments scheduled to start today> not stable, wife reports she canceled all treatments for this week yesterday - d/c foley.  When able to get enteral route, hopefully can switch and d/c CVL soon.  - follow culture data/ WBC/ fever curve   Best Practice (right click and "Reselect all SmartList Selections" daily)   Diet/type: NPO.  Consider post-pyloric cortrak for 2/21 if unable to come off BiPAP today  DVT prophylaxis: systemic heparin GI prophylaxis: PPI Lines: keep for now Foley:  Yes, and it is still needed Code Status:  full code Last date of multidisciplinary goals of care discussion family updated at  bedside 2/18.  Patient and wife updated on plan of care 2/20  CCT 38 mins   Kennieth Rad, MSN, AG-ACNP-BC  Pulmonary & Critical Care 07/12/2022, 10:09 AM  See Amion for pager If no response to pager, please call PCCM consult pager After 7:00 pm call [place]

## 2022-07-12 NOTE — TOC Progression Note (Signed)
Transition of Care [facility]) - Initial/Assessment Note    Patient Details  Name: [name] MRN: [id number] Date of Birth: 16-Dec-1955  Transition of Care [facility]) CM/SW Contact:    Milinda Antis, [place] Phone Number: 07/12/2022, 3:14 PM  Clinical Narrative:                  Transition of Care Department [facility]) has reviewed patient. Patient is from home and admitted for Acute respiratory failure with hypoxia and hypercapnia.  We will continue to monitor patient advancement through interdisciplinary progression rounds.  If new patient transition needs arise, please place a TOC consult.    Patient Goals and CMS Choice            Expected Discharge Plan and Services                                              Prior Living Arrangements/Services                       Activities of Daily Living      Permission Sought/Granted                  Emotional Assessment              Admission diagnosis:  Acute respiratory failure with hypoxia and hypercapnia ([place]) [J96.01, J96.02] Patient Active Problem List   Diagnosis Date Noted   Acute respiratory failure with hypoxia and hypercapnia (HCC) 07/09/2022   Sepsis ([place]) 07/09/2022   RSV infection 07/09/2022   Hyperkalemia 07/09/2022   Elevated troponin 07/09/2022   Swelling of lower extremity 07/09/2022   Metastatic cancer ([place]) 07/09/2022   Metastasis to adrenal gland ([place]) 06/23/2022   DOE (dyspnea on exertion) 03/03/2022   Encounter for antineoplastic immunotherapy 11/24/2021   Stroke (cerebrum) ([place]) 11/01/2021   Encounter for antineoplastic chemotherapy 09/20/2021   Stage III squamous cell carcinoma of left lung ([place]) 09/01/2021   Adenopathy 08/26/2021   Pulmonary infiltrates on CXR 07/09/2021   Chronic respiratory failure with hypoxia and hypercapnia ([place]) 07/08/2021   Cigarette smoker 07/08/2021   Acute on chronic heart failure with preserved ejection fraction (HFpEF) /Diastolic  Dysfunction CHF 06/02/2021   Moderate pulmonary hypertension ([place]) 06/02/2021   Type 2 diabetes mellitus ([place]) 06/01/2021   Atrial flutter ([place]) 05/31/2021   Acute heart failure ([place]) 05/31/2021   Hyperglycemia 05/31/2021   Elevated brain natriuretic peptide (BNP) level 05/31/2021   Hypertensive crisis    Coronary artery calcification seen on CT scan 02/02/2018   Chest pain 02/01/2018   Tobacco abuse 02/01/2018   COPD with acute exacerbation ([place]) 02/01/2018   Hyperlipidemia 02/01/2018   History of Colon cancer 04/2013   PCP:  [name] Samples, PA-C Pharmacy:   [facility] [place], [place] [address] [place] [address] Phone: [phone] Fax: [phone]     Social Determinants of Health (SDOH) Social History: SDOH Screenings   Food Insecurity: No Food Insecurity (01/14/2022)  Housing: Low Risk  (01/14/2022)  Transportation Needs: No Transportation Needs (01/14/2022)  Financial Resource Strain: Medium Risk (01/14/2022)  Tobacco Use: High Risk (07/09/2022)   SDOH Interventions:     Readmission Risk Interventions    06/01/2021   11:52 AM  Readmission Risk Prevention Plan  Post Dischage Appt  Complete  Medication Screening Complete  Transportation Screening Complete

## 2022-07-13 ENCOUNTER — Inpatient Hospital Stay (HOSPITAL_COMMUNITY): Payer: Medicare HMO

## 2022-07-13 ENCOUNTER — Ambulatory Visit: Payer: Medicare HMO | Admitting: Radiation Oncology

## 2022-07-13 DIAGNOSIS — J9601 Acute respiratory failure with hypoxia: Secondary | ICD-10-CM | POA: Diagnosis not present

## 2022-07-13 DIAGNOSIS — Z515 Encounter for palliative care: Secondary | ICD-10-CM | POA: Diagnosis not present

## 2022-07-13 DIAGNOSIS — E440 Moderate protein-calorie malnutrition: Secondary | ICD-10-CM | POA: Insufficient documentation

## 2022-07-13 DIAGNOSIS — J9602 Acute respiratory failure with hypercapnia: Secondary | ICD-10-CM | POA: Diagnosis not present

## 2022-07-13 DIAGNOSIS — Z7189 Other specified counseling: Secondary | ICD-10-CM | POA: Diagnosis not present

## 2022-07-13 LAB — BASIC METABOLIC PANEL
Anion gap: 15 (ref 5–15)
BUN: 49 mg/dL — ABNORMAL HIGH (ref 8–23)
CO2: 39 mmol/L — ABNORMAL HIGH (ref 22–32)
Calcium: 9.7 mg/dL (ref 8.9–10.3)
Chloride: 92 mmol/L — ABNORMAL LOW (ref 98–111)
Creatinine, Ser: 1.08 mg/dL (ref 0.61–1.24)
GFR, Estimated: >60 mL/min (ref >60)
Glucose, Bld: 190 mg/dL — ABNORMAL HIGH (ref 70–99)
Potassium: 3.8 mmol/L (ref 3.5–5.1)
Sodium: 146 mmol/L — ABNORMAL HIGH (ref 135–145)

## 2022-07-13 LAB — CBC
HCT: 40.7 % (ref 39.0–52.0)
Hemoglobin: 13 g/dL (ref 13.0–17.0)
MCH: 33.9 pg (ref 26.0–34.0)
MCHC: 31.9 g/dL (ref 30.0–36.0)
MCV: 106.3 fL — ABNORMAL HIGH (ref 80.0–100.0)
Platelets: 156 10*3/uL (ref 150–400)
RBC: 3.83 MIL/uL — ABNORMAL LOW (ref 4.22–5.81)
RDW: 14.4 % (ref 11.5–15.5)
WBC: 15.5 10*3/uL — ABNORMAL HIGH (ref 4.0–10.5)
nRBC: 0 % (ref 0.0–0.2)

## 2022-07-13 LAB — GLUCOSE, CAPILLARY
Glucose-Capillary: 175 mg/dL — ABNORMAL HIGH (ref 70–99)
Glucose-Capillary: 171 mg/dL — ABNORMAL HIGH (ref 70–99)
Glucose-Capillary: 196 mg/dL — ABNORMAL HIGH (ref 70–99)
Glucose-Capillary: 220 mg/dL — ABNORMAL HIGH (ref 70–99)
Glucose-Capillary: 214 mg/dL — ABNORMAL HIGH (ref 70–99)
Glucose-Capillary: 172 mg/dL — ABNORMAL HIGH (ref 70–99)

## 2022-07-13 LAB — HEPARIN LEVEL (UNFRACTIONATED): Heparin Unfractionated: >1.1 IU/mL — ABNORMAL HIGH (ref 0.30–0.70)

## 2022-07-13 LAB — MAGNESIUM: Magnesium: 2.9 mg/dL — ABNORMAL HIGH (ref 1.7–2.4)

## 2022-07-13 LAB — APTT: aPTT: 104 seconds — ABNORMAL HIGH (ref 24–36)

## 2022-07-13 MED ORDER — THIAMINE MONONITRATE 100 MG PO TABS
100.0000 mg | ORAL_TABLET | Freq: Every day | ORAL | Status: DC
  Administered 2022-07-13 – 2022-07-16 (×4): 100 mg
  Filled 2022-07-13 (×4): qty 1

## 2022-07-13 MED ORDER — METHYLPREDNISOLONE SODIUM SUCC 40 MG IJ SOLR
40.0000 mg | Freq: Every day | INTRAMUSCULAR | Status: DC
  Administered 2022-07-14: 40 mg via INTRAVENOUS
  Filled 2022-07-13 (×2): qty 1

## 2022-07-13 MED ORDER — OSMOLITE 1.5 CAL PO LIQD
1000.0000 mL | ORAL | Status: DC
  Administered 2022-07-13 – 2022-07-16 (×6): 1000 mL
  Filled 2022-07-13 (×4): qty 1000

## 2022-07-13 MED ORDER — SODIUM CHLORIDE 0.9 % IV SOLN
2.0000 g | INTRAVENOUS | Status: AC
  Administered 2022-07-14 – 2022-07-15 (×2): 2 g via INTRAVENOUS
  Filled 2022-07-13 (×2): qty 20

## 2022-07-13 MED ORDER — POTASSIUM CHLORIDE 10 MEQ/100ML IV SOLN
10.0000 meq | INTRAVENOUS | Status: AC
  Administered 2022-07-13 (×4): 10 meq via INTRAVENOUS
  Filled 2022-07-13 (×4): qty 100

## 2022-07-13 MED ORDER — APIXABAN 5 MG PO TABS
5.0000 mg | ORAL_TABLET | Freq: Two times a day (BID) | ORAL | Status: DC
  Administered 2022-07-13 – 2022-07-16 (×6): 5 mg
  Filled 2022-07-13 (×6): qty 1

## 2022-07-13 MED ORDER — OSMOLITE 1.5 CAL PO LIQD
1000.0000 mL | ORAL | Status: DC
  Administered 2022-07-13: 1000 mL

## 2022-07-13 MED ORDER — FUROSEMIDE 10 MG/ML IJ SOLN
60.0000 mg | Freq: Once | INTRAMUSCULAR | Status: AC
  Administered 2022-07-13: 60 mg via INTRAVENOUS
  Filled 2022-07-13: qty 6

## 2022-07-13 MED ORDER — METOPROLOL TARTRATE 12.5 MG HALF TABLET
12.5000 mg | ORAL_TABLET | Freq: Four times a day (QID) | ORAL | Status: DC
  Administered 2022-07-13 – 2022-07-14 (×3): 12.5 mg via ORAL
  Filled 2022-07-13 (×3): qty 1

## 2022-07-13 MED ORDER — DEXTROSE 5 % IV SOLN
250.0000 mg | Freq: Once | INTRAVENOUS | Status: AC
  Administered 2022-07-13: 250 mg via INTRAVENOUS
  Filled 2022-07-13: qty 250

## 2022-07-13 MED ORDER — PROSOURCE TF20 ENFIT COMPATIBL EN LIQD
60.0000 mL | Freq: Two times a day (BID) | ENTERAL | Status: DC
  Administered 2022-07-13 – 2022-07-16 (×7): 60 mL
  Filled 2022-07-13 (×7): qty 60

## 2022-07-13 NOTE — Progress Notes (Signed)
NAME:  [name], MRN:  [id number], DOB:  10-Jan-1956, LOS: 4 ADMISSION DATE:  07/14/2022, CONSULTATION DATE:  07/09/22 REFERRING MD: Vanita Panda CHIEF COMPLAINT:  SOB   History of Present Illness:  67 year old male with pmhx of atrial flutter, HFpEF, HTN, Lung cancer, Colon Cancer and presented to [facility] with SOB, increased WOB with O2 Sats in the 80s requiring BIPAP. Patient was positive for RSV upon admission. Patient was admitted to the ICU, where at shift change on 2/17 patient developed a cardiac arrest after standing and collapsing. CPR was initiated and performed for 1 round, intubated for airway protection, and found to have a vfib which one shock was given converting to sinus tachycardia. [facility] was [facility] for further management and care.   Pertinent  Medical History   Past Medical History:  Diagnosis Date   (HFpEF) heart failure with preserved ejection fraction ([place])    TTE 06/01/2021: EF 60-65, no RWMA, mild LVH, mild reduced RVSF, moderate pulm hypertension (RVSP 47.3), mild BAE, trivial MR, moderate TR, AV sclerosis, aortic root 41 mm, RAP 15   Atrial flutter ([facility])    s/p DCCV 06/03/21   Chest pain    Neg nuclear stress test in 2019   Colon cancer ([place]) 04/22/2013   COPD (chronic obstructive pulmonary disease) ([place])    Dilated aortic root ([place])    TTE 05/2021: 4.1 cm   Hypertension    Lung cancer ([place]) 08/26/2021   Renal disorder    Kidney stones     Significant Hospital Events: Including procedures, antibiotic start and stop dates in addition to other pertinent events   2/16 Admitted for respiratory failure requiring BIPAP, + for RSV 2/17 V-fib cardiac arrest, intubated, [facility]  2/18 extubated/ bipap  2/19 ongoing afib/ SVT on dilt  Interim History / Subjective:  Tolerated HHFNC for about 2h.   Objective   Blood pressure 122/88, pulse (!) 134, temperature 97.6 F (36.4 C), temperature source Axillary, resp. rate (!) 24, height 6\' 1"   (1.854 m), weight 110 kg, SpO2 93 %. CVP:  [5 mmHg-15 mmHg] 5 mmHg  Vent Mode: BIPAP;PCV FiO2 (%):  [40 %-52 %] 40 % Set Rate:  [10 bmp] 10 bmp PEEP:  [8 cmH20] 8 cmH20 Pressure Support:  [8 cmH20] 8 cmH20   Intake/Output Summary (Last 24 hours) at 07/13/2022 9024 Last data filed at 07/13/2022 0700 Gross per 24 hour  Intake 1574.58 ml  Output 3025 ml  Net -1450.42 ml   Filed Weights   07/11/22 0452 07/12/22 0440 07/13/22 0428  Weight: 117.8 kg 112.5 kg 110 kg    Examination: General:  Chronically ill appearing still on NIV Neuro: Awake, alert, f/c, appropriate, MAE CV: rr ir tachy PULM:  mech braeth sounds bl, diminished GI: obese, +bs, soft, NT Extremities: warm/dry, ankle/ pedal 1+ pitting edema, L foot > R foot Skin: no rashes  Na 146 S Cr 1.08  2/17 Bcx2> ngtd 2/17 MRSA PCR > neg 2/16 azithro/ ctx >   Resolved Hospital Problem list   N/a   Assessment & Plan:  Vfib cardiac arrest- in context of AECOPD and RSV pneumonia;  Acute on chronic hypoxemic and hypercarbic respiratory failure- due to RSV pneumonia.  On CAP coverage.  Extubated 2/18 but tenuous.  Pushing diuresis seems to be helping but he is back in a tachyarrhythmia. Probable type 2 NSTEMI: echo preserved Advanced COPD in flare Stage IV lung cancer mets to adrenal gland Afluter/ Afib-  recurrent, recalcitrant to dilt Abdominal distension - likely due to aerophagia, possible ileus P:  - cont diuresis with lasix for net negative fluid balance, metolazone in setting hypernatremia - cont to trend CVP/ daily wts/ strict I/Os - cardiology following, continue amio, metop q6, can transition to per tube metop once cortrak placed - heparin per pharmacy until more stable from respiratory standpoint to take PO's consistently> then switch to Eliquis  - remains on BiPAP - see IPAL discussion today - start TF once cortrak placed - goal SpO2 88-94%, not unexpected to have some brief desaturations with exertion.  Will  likely be a slow recovery from RSV/ AECOPD/ CAP.   - continue nebs  - decrease solumedrol 60mg  BID> daily solumedrol 40 mg IV - adjust basal bolus insulin as needed - PT when more stable from respiratory/ cardiac standpoint - appreciate ongoing PMT assistance with GOC - SBRT when more stable - ceftriaxone 7 day course tentatively - check CXR today  Best Practice (right click and "Reselect all SmartList Selections" daily)   Diet/type: NPO.  TF when cortrak placed DVT prophylaxis: systemic heparin GI prophylaxis: PPI Lines: keep for now Foley:  N/A Code Status:  full code Last date of multidisciplinary goals of care discussion family updated at see ipal note 07/13/22  CCT 45 minutes  [place] for pager If no response to pager, please call PCCM consult pager After 7:00 pm call Elink

## 2022-07-13 NOTE — Progress Notes (Signed)
ANTICOAGULATION CONSULT NOTE - Follow Up Consult  Pharmacy Consult for Heparin Indication: atrial fibrillation  Allergies  Allergen Reactions   Paclitaxel Shortness Of Breath    Hypersensitivity reaction. See progress note dated 09/27/2021.   Penicillins Other (See Comments)    Childhood Allergy  Unknown reaction   Has patient had a PCN reaction causing immediate rash, facial/tongue/throat swelling, SOB or lightheadedness with hypotension: No Has patient had a PCN reaction causing severe rash involving mucus membranes or skin necrosis: No Has patient had a PCN reaction that required hospitalization: No Has patient had a PCN reaction occurring within the last 10 years: No If all of the above answers are "NO", then may proceed with Cephalosporin use.     Patient Measurements: Height: 6\' 1"  (185.4 cm) Weight: 110 kg (242 lb 8.1 oz) IBW/kg (Calculated) : 79.9 Heparin Dosing Weight: 108 kg  Vital Signs: Temp: 97.6 F (36.4 C) (02/21 0724) Temp Source: Axillary (02/21 0724) BP: 119/94 (02/21 0811) Pulse Rate: 134 (02/21 0811)  Labs: Recent Labs    07/11/22 0318 07/11/22 2230 07/12/22 0336 07/12/22 1002 07/13/22 0500  HGB 12.6*  --  11.7*  --  13.0  HCT 38.1*  --  37.3*  --  40.7  PLT 140*  --  143*  --  156  APTT  --    < > 112* 86* 104*  HEPARINUNFRC  --   --  >1.10*  --  >1.10*  CREATININE 1.17  --  1.19  --  1.08   < > = values in this interval not displayed.     Estimated Creatinine Clearance: 86.3 mL/min (by C-G formula based on SCr of 1.08 mg/dL).  Assessment: 34 YOM admitted for respiratory failure due to RSV s/p extubation requiring persistent BiPAP therapy. On Eliquis for atrial fibrillation, last dose 2/18 AM. Pharmacy consulted to dose heparin until patient is able to consistently take oral medications.  aPTT 104 (supratherapeutic) on heparin 1450 units/hr. Heparin level remains >1.1 as expected given recent Eliquis dose. CBC is stable with no signs of  bleeding reported.  Will monitor with aPTT until heparin levels are correlating.  Goal of Therapy:  Heparin level 0.3-0.7 units/ml aPTT 66-102 seconds Monitor platelets by anticoagulation protocol: Yes   Plan:  Decrease heparin to 1350 units/hr Daily CBC and aPTT/heparin level Monitor for signs/symptoms of bleeding  Erskine Speed, PharmD Clinical Pharmacist 07/13/2022 9:07 AM

## 2022-07-13 NOTE — Progress Notes (Signed)
Pt placed on BIPAP to rest. Pt tolerating well with stable vitals.

## 2022-07-13 NOTE — Progress Notes (Addendum)
Initial Nutrition Assessment  DOCUMENTATION CODES:   Non-severe (moderate) malnutrition in context of chronic illness  INTERVENTION:  Initiate tube feeding via post-pyloric Cortrak tube: Osmolite 1.5 at 65 ml/h (1560 ml per day) Prosource TF20 60 ml BID Provides 2500 kcal, 138 gm protein, 1189 ml free water daily Pt at mild risk of refeeding due to malnutrition and ~1 week of no PO intake. Will advance slowly and add 100mg  of thiamine. Monitor phosphorus and Mg. MD to replace if deficient.  NUTRITION DIAGNOSIS:   Moderate Malnutrition related to chronic illness (cancer) as evidenced by mild fat depletion, mild muscle depletion, moderate fat depletion.  GOAL:   Patient will meet greater than or equal to 90% of their needs  MONITOR:   TF tolerance, Diet advancement, Skin, I & O's, Labs  REASON FOR ASSESSMENT:   Consult Enteral/tube feeding initiation and management  ASSESSMENT:   Pt with hx of HTN, CHF, hx colon cancer 2014 s/p partial colectomy, COPD, lung cancer stage 3, dx April 2023, daily EtOH use, and tobacco abuse presented to ED with respiratory distress.  Of note. PET scan recently showed developing right adrenal nodule worrisome for metastatic deposit. Pt was scheduled to start palliative radiotherapy 2/19  2/17 - [place] called in [facility] ED, received CPR and intubated, transferred to [facility] 2/18 - extubated  2/21 - cortrak placed, tip terminates in the fourth portion of the duodenum.  Pt resting in bed at the time of assessment. Just underwent cortrak placement. Discussed recent intake and nutrition hx. Pt reports that at baseline he normally has 2 meals each day and does a smaller snack-like meal at lunch (pack of nabs, etc). Drinks mainly water or soda. Pt reports that since Friday (2/16) he has not had anything to eat.   Discussed weight and noted a significant loss of 13% over the last month. Pt is -7L this admission and states that some loss was due to fluid  and swelling but that he suspects he has lost bodyweight over the last week with his poor PO.   Does not drink nutrition supplements at baseline.   Intake/Output Summary (Last 24 hours) at 07/13/2022 1413 Last data filed at 07/13/2022 1400 Gross per 24 hour  Intake 1692.04 ml  Output 3025 ml  Net -1332.96 ml  Net IO Since Admission: -6,826.28 mL [07/13/22 1413]   Nutritionally Relevant Medications: Scheduled Meds:  atorvastatin  20 mg Per Tube QPM   insulin aspart  0-15 Units Subcutaneous Q4H   insulin detemir  5 Units Subcutaneous BID   pantoprazole (PROTONIX) IV  40 mg Intravenous Q24H   Continuous Infusions:  chlorothiazide (DIURIL) 250 mg in dextrose 5 % 50 mL IVPB     potassium chloride 100 mL/hr at 07/13/22 1100   Labs Reviewed: Na 146, chloride 92 BUN 49 Mg 2.9 CBG ranges from 171-210 mg/dL over the last 24 hours  NUTRITION - FOCUSED PHYSICAL EXAM: Flowsheet Row Most Recent Value  Orbital Region Mild depletion  Upper Arm Region Mild depletion  Thoracic and Lumbar Region No depletion  Buccal Region No depletion  Temple Region Moderate depletion  Clavicle Bone Region No depletion  Clavicle and Acromion Bone Region No depletion  Scapular Bone Region No depletion  Dorsal Hand Moderate depletion  Patellar Region Mild depletion  Anterior Thigh Region Mild depletion  Posterior Calf Region Mild depletion  Edema (RD Assessment) Mild  Hair Reviewed  Eyes Reviewed  Mouth Reviewed  Skin Reviewed  Nails Reviewed    Diet  Order:   Diet Order             Diet NPO time specified  Diet effective now                   EDUCATION NEEDS:   Education needs have been addressed  Skin:  Skin Assessment: Reviewed RN Assessment (open blister to the buttocks)  Last BM:  2/21 - type 6  Height:  Ht Readings from Last 1 Encounters:  07/09/22 6\' 1"  (1.854 m)    Weight:  Wt Readings from Last 1 Encounters:  07/13/22 110 kg    Ideal Body Weight:  83.6 kg  BMI:   Body mass index is 31.99 kg/m.  Estimated Nutritional Needs:  Kcal:  2400-2600 kcal/d Protein:  120-145g/d Fluid:  2.2-2.4L/d    Ranell Patrick, RD, LDN Clinical Dietitian RD pager # available in [facility]  After hours/weekend pager # available in [facility]

## 2022-07-13 NOTE — Procedures (Signed)
Cortrak  Person Inserting Tube:  Idelia Salm, RD Tube Type:  Cortrak - 55 inches Tube Size:  10 Tube Location:  Left nare Secured by: Bridle Technique Used to Measure Tube Placement:  Marking at nare/corner of mouth Cortrak Secured At:  102 cm   Cortrak Tube Team Note:  Consult received to place a Cortrak feeding tube.   X-ray is required, abdominal x-ray has been ordered by the Cortrak team. Please confirm tube placement before using the Cortrak tube.   If the tube becomes dislodged please keep the tube and contact the Cortrak team at [email or web address] for replacement.  If after hours and replacement cannot be delayed, place a NG tube and confirm placement with an abdominal x-ray.    Gustavus Bryant, MS, RD, LDN Inpatient Clinical Dietitian Please see AMiON for contact information.

## 2022-07-13 NOTE — IPAL (Signed)
  Interdisciplinary Goals of Care Family Meeting   Date carried out:: 07/13/2022  Location of the meeting: Bedside  Member's involved: Physician and Family Member or next of kin - by phone  Durable Power of Attorney or acting medical decision maker: Ms. Pickrell (spouse)    Discussion: We discussed goals of care for [name]. Ordinarily I would recommend elective intubation if a patient has gone >24h with BiPAP and they're not able to eat, drink, or reliably take oral medications. We talked about hwo with his abdominal distension, ongoing NIV need that when he starts TF (which he'll need to recover eventually) he'll be at very high risk for aspiration. We talked about how if he were to need intubation it would likely be a while maybe a month at best until he could get strong enough to tolerate treatment for his metastatic lung cancer. Perhaps he could do radiation alone sooner. And his cancer may progress in meantime. Knowing this he was still open to intubation but only if it was absolutely necessary to save his life. We talked about giving it a time-limited trial of 7 days. Beyond that time frame on the vent I think his likelihood of recovering to extent he could have his cancer treated soon would be remote and then I think we would plan on one-way extubation vs proceed with comfort measures only depending on his condition after 7 days on the vent if he needs intubation.  Code status: Full Code  Disposition: Continue current acute care   Time spent for the meeting: 20 minutes  Maryjane Hurter 07/13/2022, 10:05 AM

## 2022-07-13 NOTE — Progress Notes (Signed)
Palliative:  HPI: 67 y.o. male with medical history significant of atrial flutter, HFpEF, hypertension, colon cancer (in remission), lung cancer (recent adrenal mets plans for rad onc), COPD presents the ED with a chief complaint of dyspnea related to RSV in the setting of lung cancer, COPD, CHF exacerbation. Cardiac arrest 07/09/22 ROSC after 1 round CPR and intubated. Extubated 07/10/22 but still requiring BiPAP support.    I received update from Dr. Verlee Monte for conversation this morning. I visited with [name] after receiving update from RN. [name] is tolerating HFNC and able to converse more today. He maintains good spirits and tells me he feels a little better today. He is still hopeful for improvement and agrees with ongoing treatment. We did review his goals and he tells me that although he does not really want to be on ventilator support he would agree to intubation if absolutely necessary. He would not want this long term - "only for a few days." He acknowledges that this is difficult to think about and discuss and he is hopeful he will not need intubation. For this time we will continue current interventions and hopeful for improvement. He does agree with trial of intubation "if necessary."  I called and spoke with wife, [name]. [name] is ill herself but is hopeful that she will feel up to visiting tomorrow. I encouraged her to not return to the hospital if she is still ill as she needs to care for herself and we need to avoid spreading any other infections to anyone else in the hospital. [name] understands and agrees. I reassured her that the nurses and multiple providers helping to care for her husband will call her for updates and with any changes. [name] had questions and concerns about her husband's wishes. She is concerned that these conversations are happening when she is not present. I explained that [name] is finally off BiPAP and in a place where we can have a conversation with him so this is the reason  for conversation timing. I explained it is important for [place] to know a plan just in case he were to decline again. I reviewed with [name] that [name] has expressed that we would consider short trial of intubation if needed. I reassured [name] that this short trial will give [place] enough time to know if [name] will be able to improve because longer times on ventilator support (especially in the setting of his lung disease and cancer) leads to worsening outcomes and prognosis. [name] is accepting of Tony's expressed wishes at this time.   All questions/concerns addressed. Emotional support provided.   Exam: Alert, oriented. Short of breath at rest - poor reserve. HR irreg 130s. Abd distended but soft. BLE edema present but improved.   Plan: - Ongoing palliative support and conversations.  - Full code, full scope. Open to limited time trial of intubation as a last resort.   [place], NP Palliative Medicine Team Pager [phone] (Please see [email or web address] for schedule) Team Phone [phone]    Greater than 50%  of this time was spent counseling and coordinating care related to the above assessment and plan

## 2022-07-13 NOTE — Progress Notes (Signed)
Our service was aware of the patient's hospitalization earlier in the week as his radiation to the adrenal gland was supposed to begin on Monday 07/11/22. Given his respiratory failure and subsequent cardiopulmonary dysfunction, need for Bipap and concerns for severity of his current status we will hold off on treatment this week. We will revisit how he is doing next week to determine if radiation will be given.     Carola Rhine, PAC

## 2022-07-13 NOTE — Telephone Encounter (Signed)
Spoke to pt's spouse who verbalized understanding.

## 2022-07-13 NOTE — Progress Notes (Signed)
    67 y.o. male with V-fib cardiac arrest in the setting of RSV pneumonia with respiratory failure, type II MI, advanced COPD stage IV lung cancer with mets to the adrenal gland with difficult to control atrial fib/flutter   Reviewed goals of care discussion.  Heart rate still incessantly 131.  Atrial flutter EF 65%, tachycardic regular ill-appearing.  Continue with amiodarone as well as metoprolol.  Metastatic lung cancer with adrenal gland metastasis.  Receiving IV Lasix.  No new recommendations at this time.  Continue with supportive care.  Candee Furbish, MD

## 2022-07-14 ENCOUNTER — Ambulatory Visit: Payer: Medicare HMO | Admitting: Radiation Oncology

## 2022-07-14 DIAGNOSIS — C7800 Secondary malignant neoplasm of unspecified lung: Secondary | ICD-10-CM

## 2022-07-14 DIAGNOSIS — Z7189 Other specified counseling: Secondary | ICD-10-CM | POA: Diagnosis not present

## 2022-07-14 DIAGNOSIS — Z515 Encounter for palliative care: Secondary | ICD-10-CM | POA: Diagnosis not present

## 2022-07-14 DIAGNOSIS — Z66 Do not resuscitate: Secondary | ICD-10-CM

## 2022-07-14 DIAGNOSIS — J9602 Acute respiratory failure with hypercapnia: Secondary | ICD-10-CM | POA: Diagnosis not present

## 2022-07-14 DIAGNOSIS — J9601 Acute respiratory failure with hypoxia: Secondary | ICD-10-CM | POA: Diagnosis not present

## 2022-07-14 LAB — CULTURE, BLOOD (ROUTINE X 2)
Culture: NO GROWTH
Culture: NO GROWTH
Special Requests: ADEQUATE
Special Requests: ADEQUATE

## 2022-07-14 LAB — GLUCOSE, CAPILLARY
Glucose-Capillary: 200 mg/dL — ABNORMAL HIGH (ref 70–99)
Glucose-Capillary: 220 mg/dL — ABNORMAL HIGH (ref 70–99)
Glucose-Capillary: 198 mg/dL — ABNORMAL HIGH (ref 70–99)
Glucose-Capillary: 252 mg/dL — ABNORMAL HIGH (ref 70–99)
Glucose-Capillary: 328 mg/dL — ABNORMAL HIGH (ref 70–99)
Glucose-Capillary: 211 mg/dL — ABNORMAL HIGH (ref 70–99)

## 2022-07-14 LAB — BASIC METABOLIC PANEL
Anion gap: 13 (ref 5–15)
BUN: 63 mg/dL — ABNORMAL HIGH (ref 8–23)
CO2: 35 mmol/L — ABNORMAL HIGH (ref 22–32)
Calcium: 9.9 mg/dL (ref 8.9–10.3)
Chloride: 98 mmol/L (ref 98–111)
Creatinine, Ser: 1.1 mg/dL (ref 0.61–1.24)
GFR, Estimated: >60 mL/min (ref >60)
Glucose, Bld: 223 mg/dL — ABNORMAL HIGH (ref 70–99)
Potassium: 4.4 mmol/L (ref 3.5–5.1)
Sodium: 146 mmol/L — ABNORMAL HIGH (ref 135–145)

## 2022-07-14 LAB — MAGNESIUM: Magnesium: 3 mg/dL — ABNORMAL HIGH (ref 1.7–2.4)

## 2022-07-14 LAB — PHOSPHORUS: Phosphorus: 4 mg/dL (ref 2.5–4.6)

## 2022-07-14 MED ORDER — INSULIN DETEMIR 100 UNIT/ML ~~LOC~~ SOLN
5.0000 [IU] | Freq: Once | SUBCUTANEOUS | Status: AC
  Administered 2022-07-14: 5 [IU] via SUBCUTANEOUS
  Filled 2022-07-14: qty 0.05

## 2022-07-14 MED ORDER — FUROSEMIDE 10 MG/ML IJ SOLN
60.0000 mg | Freq: Once | INTRAMUSCULAR | Status: AC
  Administered 2022-07-14: 60 mg via INTRAVENOUS
  Filled 2022-07-14: qty 6

## 2022-07-14 MED ORDER — INSULIN DETEMIR 100 UNIT/ML ~~LOC~~ SOLN
10.0000 [IU] | Freq: Two times a day (BID) | SUBCUTANEOUS | Status: DC
  Administered 2022-07-14 – 2022-07-16 (×4): 10 [IU] via SUBCUTANEOUS
  Filled 2022-07-14 (×7): qty 0.1

## 2022-07-14 MED ORDER — DEXMEDETOMIDINE HCL IN NACL 400 MCG/100ML IV SOLN
0.3000 ug/kg/h | INTRAVENOUS | Status: AC
  Administered 2022-07-14 (×3): 0.3 ug/kg/h via INTRAVENOUS
  Filled 2022-07-14 (×3): qty 100

## 2022-07-14 MED ORDER — METOLAZONE 5 MG PO TABS
5.0000 mg | ORAL_TABLET | Freq: Once | ORAL | Status: AC
  Administered 2022-07-14: 5 mg
  Filled 2022-07-14: qty 1

## 2022-07-14 MED ORDER — METOPROLOL TARTRATE 12.5 MG HALF TABLET
12.5000 mg | ORAL_TABLET | Freq: Four times a day (QID) | ORAL | Status: DC
  Administered 2022-07-14 – 2022-07-16 (×10): 12.5 mg
  Filled 2022-07-14 (×10): qty 1

## 2022-07-14 NOTE — Progress Notes (Signed)
   Inpatient Rehab Admissions Coordinator :  Per therapy recommendations patient was screened for CIR candidacy by Brindley Madarang RN MSN. Patient is not yet at a level to tolerate the intensity required to pursue a CIR admit . Patient may have the potential to progress to become a candidate. The CIR admissions team will follow and monitor for progress and place a Rehab Consult order if felt to be appropriate. Please contact me with any questions.  Shemia Bevel RN MSN Admissions Coordinator [phone]  

## 2022-07-14 NOTE — Progress Notes (Signed)
    Telemetry reviewed.  Heart rate remains in the 130s.   Continuing with amiodarone infusion.  Metoprolol 12.5 mg per tube every 6 hours.  Pressures are fairly soft.   Candee Furbish, MD

## 2022-07-14 NOTE — Progress Notes (Signed)
eLink Physician-Brief Progress Note Patient Name: [name] DOB: 04-06-1956 MRN: [id number]   Date of Service  07/14/2022  HPI/Events of Note  Patient keeps pulling his BIPAP  mask off, his respiratory status is tenuous (high risk for intubation).  eICU Interventions  Will avoid  anxiolytics / sedatives with potential for respiratory depression. Will start low dose fixed Precedex gtt for mild sedation and anxiolysis without respiratory depression.        Frederik Pear 07/14/2022, 12:58 AM

## 2022-07-14 NOTE — Progress Notes (Signed)
Palliative:  HPI: 67 y.o. male with medical history significant of atrial flutter, HFpEF, hypertension, colon cancer (in remission), lung cancer (recent adrenal mets plans for rad onc), COPD presents the ED with a chief complaint of dyspnea related to RSV in the setting of lung cancer, COPD, CHF exacerbation. Cardiac arrest 07/09/22 ROSC after 1 round CPR and intubated. Extubated 07/10/22 but still requiring BiPAP support.    I visited with [name] after receiving update from RN.  [name] was started on Precedex infusion overnight due to difficulty tolerating BiPAP.  He is now back on high flow nasal cannula and seems to be tolerating well.  He is alert and oriented and able to converse with me appropriately.  We reviewed his conversation with [name] and the physicians yesterday.  He tells me he is no longer sure if he would want to be intubated.  He asks me "what for?".  He further explains he does not think that would ultimately improve his condition and is not sure if he is up for more aggressive medical interventions.  We discussed the need to include his wife.  These conversations and he agrees that this is appropriate.  He agrees to me calling [name] when I leave the room.    Called to wife [name].  She is on her way to the hospital now.  We reviewed her conversation with [name] yesterday as well.  I share with her [name]'s statements to me this morning.  We discussed his wavering opinion on if he would want to be reintubated or not.  She tells me that his thoughts on this have waivered over the past couple of days.  I encouraged her to continue these conversations with [name] as she comes to visit today so that we can all understand what is important to [place].  She agrees to do this.  I offered my support in these conversations but she feels it would be best for her to speak with [name] alone at this time.  I discussed on the palliative team will follow-up with him tomorrow and she is agreeable to this.  All  questions/concerns addressed. Emotional support provided.   Exam: Alert, oriented. Tolerating HFNC. HR irreg 130s. Abd distended but soft.  Plan: - Ongoing palliative support and conversations.  - Full code, full scope. Open to limited time trial of intubation as a last resort - some wavering on this, wife to discuss more with patient today with PMT to follow up tomorrow  Cameron Burrow, DNP, [facility] Palliative Medicine Team Team Phone # [phone]  Pager # [phone]

## 2022-07-14 NOTE — Progress Notes (Addendum)
NAME:  [name], MRN:  [id number], DOB:  [birth year], LOS: 5 ADMISSION DATE:  07/16/2022, CONSULTATION DATE:  07/09/22 REFERRING MD: Vanita Panda CHIEF COMPLAINT:  SOB   History of Present Illness:  67 year old male with pmhx of atrial flutter, HFpEF, HTN, Lung cancer, Colon Cancer and presented to [facility] with SOB, increased WOB with O2 Sats in the 80s requiring BIPAP. Patient was positive for RSV upon admission. Patient was admitted to the ICU, where at shift change on 2/17 patient developed a cardiac arrest after standing and collapsing. CPR was initiated and performed for 1 round, intubated for airway protection, and found to have a vfib which one shock was given converting to sinus tachycardia. Patient was transferred to [facility] for further management and care.   Pertinent  Medical History   Past Medical History:  Diagnosis Date   (HFpEF) heart failure with preserved ejection fraction ([place])    TTE 06/01/2021: EF 60-65, no RWMA, mild LVH, mild reduced RVSF, moderate pulm hypertension (RVSP 47.3), mild BAE, trivial MR, moderate TR, AV sclerosis, aortic root 41 mm, RAP 15   Atrial flutter ([facility])    s/p DCCV 06/03/21   Chest pain    Neg nuclear stress test in 2019   Colon cancer ([place]) 04/22/2013   COPD (chronic obstructive pulmonary disease) ([place])    Dilated aortic root ([place])    TTE 05/2021: 4.1 cm   Hypertension    Lung cancer ([place]) 08/26/2021   Renal disorder    Kidney stones     Significant Hospital Events: Including procedures, antibiotic start and stop dates in addition to other pertinent events   2/16 Admitted for respiratory failure requiring BIPAP, + for RSV 2/17 V-fib cardiac arrest, intubated, transferred from [facility] to [facility]  2/18 extubated/ bipap  2/19 ongoing afib/ SVT on dilt  Interim History / Subjective:  No acute issues, PMT following as we discuss goals of care  Tolerated HHFNC a little longer yesterday  Remains in flutter rate 130s   Objective   Blood pressure 103/80,  pulse (!) 137, temperature 98.5 F (36.9 C), temperature source Axillary, resp. rate (!) 24, height 6\' 1"  (1.854 m), weight 109.6 kg, SpO2 95 %. CVP:  [6 mmHg-8 mmHg] 7 mmHg  Vent Mode: BIPAP;PCV FiO2 (%):  [40 %-52 %] 50 % Set Rate:  [10 bmp] 10 bmp PEEP:  [8 cmH20] 8 cmH20   Intake/Output Summary (Last 24 hours) at 07/14/2022 1029 Last data filed at 07/14/2022 0800 Gross per 24 hour  Intake 1526.74 ml  Output 2950 ml  Net -1423.26 ml   Filed Weights   07/12/22 0440 07/13/22 0428 07/14/22 0500  Weight: 112.5 kg 110 kg 109.6 kg    Examination: General:  Chronically ill appearing on HHFNC Neuro: Awake, alert, f/c CV: regular tachy, no murmur PULM:  insp wheeze bl, diminished GI: obese,  distended, +bs, soft, NT Extremities: lukewarm 1+ pitting edema, L foot > R foot   Na 146 S Cr 1.1  2/17 Bcx2> ngtd 2/17 MRSA PCR > neg 2/16 azithro/ ctx >   Resolved Hospital Problem list   N/a   Assessment & Plan:  Vfib cardiac arrest- in context of AECOPD and RSV pneumonia;  Acute on chronic hypoxemic and hypercarbic respiratory failure- due to RSV pneumonia.  On CAP coverage.  Extubated 2/18 but tenuous.  Pushing diuresis seems to be helping but he is back in a tachyarrhythmia. Probable type 2 NSTEMI: echo preserved Advanced COPD in flare Stage IV lung  cancer mets to adrenal gland Afluter/ Afib- recurrent, recalcitrant to dilt Abdominal distension - likely due to aerophagia, possible ileus P:  - cont diuresis with lasix for net negative fluid balance, metolazone in setting hypernatremia - cardiology following, continue amio, metop q6 - eliquis 5 bid - continue NIV prn, HHFNC  - PT/chair position - goal SpO2 88-94%, not unexpected to have some brief desaturations with exertion.  Will likely be a slow recovery from RSV/ AECOPD/ CAP.   - continue nebs  - solumedrol 40 mg daily, taper down to 20 mg daily prednisone tomorrow (on it more or less chronically) - adjust basal bolus  insulin as needed - appreciate ongoing PMT assistance with GOC - SBRT when more stable - ceftriaxone 7 day course tentatively  Best Practice (right click and "Reselect all SmartList Selections" daily)   Diet/type:  TF DVT prophylaxis: DOAC GI prophylaxis: PPI Lines: keep for now Foley:  N/A Code Status:  full code Last date of multidisciplinary goals of care discussion: 2/22, now confirms DNR/DNI  CCT 40 minutes  [place] for pager If no response to pager, please call PCCM consult pager After 7:00 pm call Elink

## 2022-07-14 NOTE — Evaluation (Signed)
Physical Therapy Evaluation Patient Details Name: [name] MRN: [id number] DOB: [birth year] Today's Date: 07/14/2022  History of Present Illness  67 yo male presents to [name] on 2/16 with respiratory distress secondary to COPD exacerbation, RSV. Coded in ED 07/28/2022 due to vfib s/p CPR and AED cardioversion, ETT 06/2016/03/08. PMH includes lung cancer 08/2021, COPD, HF, aflutter, colon cancer 2014, HTN.  Clinical Impression   Pt presents with weakness, impaired balance, dyspnea on exertion with high O2 demands, impaired endurance. Pt to benefit from acute PT to address deficits. Pt requiring min-mod +2 for transfer-level mobility at this time, BP stable throughout session and other vitals listed below. PT recommends post-acute rehab to return to PLOF. PT to progress mobility as tolerated, and will continue to follow acutely.   SpO2 85-95% 60L/51% FiO2, HR 130s throughout session, RR 20-40 breaths/min        Recommendations for follow up therapy are one component of a multi-disciplinary discharge planning process, led by the attending physician.  Recommendations may be updated based on patient status, additional functional criteria and insurance authorization.  Follow Up Recommendations Acute inpatient rehab (3hours/day)      Assistance Recommended at Discharge Frequent or constant Supervision/Assistance  Patient can return home with the following  A lot of help with walking and/or transfers;A lot of help with bathing/dressing/bathroom;Assistance with cooking/housework;Direct supervision/assist for medications management;Help with stairs or ramp for entrance    Equipment Recommendations Other (comment) (defer)  Recommendations for Other Services       Functional Status Assessment Patient has had a recent decline in their functional status and demonstrates the ability to make significant improvements in function in a reasonable and predictable amount of time.     Precautions / Restrictions  Precautions Precautions: Fall Precaution Comments: HHFNC 60L, 51% FiO2 Restrictions Weight Bearing Restrictions: No      Mobility  Bed Mobility Overal bed mobility: Needs Assistance Bed Mobility: Supine to Sit, Sit to Supine     Supine to sit: Min assist, +2 for physical assistance Sit to supine: +2 for physical assistance, Mod assist   General bed mobility comments: assist for LE progression to EOB, trunk lowering and positioning once returned to supine.    Transfers Overall transfer level: Needs assistance Equipment used: Rolling walker (2 wheels) Transfers: Sit to/from Stand, Bed to chair/wheelchair/BSC Sit to Stand: Min assist, +2 physical assistance   Step pivot transfers: Min assist, +2 physical assistance       General transfer comment: assist for power up, steadying once standing, and lateral steps towards HOB. STS x2, tolerance for standing 2 minutes during pericare    Ambulation/Gait               General Gait Details: nt  Financial trader Rankin (Stroke Patients Only)       Balance Overall balance assessment: Needs assistance Sitting-balance support: No upper extremity supported, Feet supported Sitting balance-Leahy Scale: Fair     Standing balance support: During functional activity, Bilateral upper extremity supported Standing balance-Leahy Scale: Poor Standing balance comment: reliant on external assist                             Pertinent Vitals/Pain Pain Assessment Pain Assessment: Faces Faces Pain Scale: Hurts a little bit Pain Location: chest Pain Descriptors / Indicators: Tightness Pain Intervention(s): Monitored during session, Limited activity within patient's  tolerance, Repositioned    Home Living Family/patient expects to be discharged to:: Private residence Living Arrangements: Spouse/significant other Available Help at Discharge: Family;Available 24 hours/day Type of  Home: House Home Access: Stairs to enter Entrance Stairs-Rails: None Entrance Stairs-Number of Steps: 1   Home Layout: One level Home Equipment: None      Prior Function Prior Level of Function : Independent/Modified Independent;Driving                     Hand Dominance   Dominant Hand: Right    Extremity/Trunk Assessment   Upper Extremity Assessment Upper Extremity Assessment: Defer to OT evaluation    Lower Extremity Assessment Lower Extremity Assessment: Generalized weakness    Cervical / Trunk Assessment Cervical / Trunk Assessment: Kyphotic  Communication   Communication: Other (comment) (dyspnea with talking, speaking in 3-5 word phrases)  Cognition Arousal/Alertness: Awake/alert Behavior During Therapy: WFL for tasks assessed/performed Overall Cognitive Status: Within Functional Limits for tasks assessed                                          General Comments General comments (skin integrity, edema, etc.): SpO2 85-95% 60L/51% FiO2, HR 130s throughout session, RR 20-40 breaths/min    Exercises     Assessment/Plan    PT Assessment Patient needs continued PT services  PT Problem List Decreased strength;Decreased mobility;Decreased activity tolerance;Decreased balance;Decreased knowledge of use of DME;Pain;Decreased cognition;Decreased safety awareness;Cardiopulmonary status limiting activity       PT Treatment Interventions DME instruction;Therapeutic activities;Gait training;Patient/family education;Therapeutic exercise;Balance training;Stair training;Functional mobility training;Neuromuscular re-education    PT Goals (Current goals can be found in the Care Plan section)  Acute Rehab PT Goals PT Goal Formulation: With patient/family Time For Goal Achievement: 07/28/22 Potential to Achieve Goals: Good    Frequency Min 3X/week     Co-evaluation PT/OT/SLP Co-Evaluation/Treatment: Yes Reason for Co-Treatment: For  patient/therapist safety;To address functional/ADL transfers;Complexity of the patient's impairments (multi-system involvement) PT goals addressed during session: Mobility/safety with mobility;Balance         AM-PAC PT "6 Clicks" Mobility  Outcome Measure Help needed turning from your back to your side while in a flat bed without using bedrails?: A Little Help needed moving from lying on your back to sitting on the side of a flat bed without using bedrails?: A Lot Help needed moving to and from a bed to a chair (including a wheelchair)?: A Lot Help needed standing up from a chair using your arms (e.g., wheelchair or bedside chair)?: A Lot Help needed to walk in hospital room?: A Lot Help needed climbing 3-5 steps with a railing? : Total 6 Click Score: 12    End of Session Equipment Utilized During Treatment: Oxygen Activity Tolerance: Patient tolerated treatment well;Patient limited by fatigue Patient left: in bed;with call bell/phone within reach;with chair alarm set;with nursing/sitter in room;with family/visitor present;Other (comment) (chair position in bed) Nurse Communication: Mobility status PT Visit Diagnosis: Other abnormalities of gait and mobility (R26.89);Muscle weakness (generalized) (M62.81)    Time: 0092-3300 PT Time Calculation (min) (ACUTE ONLY): 29 min   Charges:   PT Evaluation $PT Eval Moderate Complexity: 1 Mod          Dorien Mayotte S, PT DPT Acute Rehabilitation Services Pager [phone]  Office [phone]   Louis Matte 07/14/2022, 12:03 PM

## 2022-07-14 NOTE — Evaluation (Signed)
Occupational Therapy Evaluation Patient Details Name: [name] MRN: [id number] DOB: 1956/03/20 Today's Date: 07/14/2022   History of Present Illness 67 yo male presents to [facility] on 2/16 with respiratory distress secondary to COPD exacerbation, RSV. Coded in ED 07/11/22 due to vfib s/p CPR and AED cardioversion, ETT 2/02-19-2018. PMH includes lung cancer 08/2021, COPD, HF, aflutter, colon cancer 2014, HTN.   Clinical Impression   Pt is typically independent and uses 3-4L O2 at home. He presents with generalized weakness, impaired standing balance, slow processing and decreased activity tolerance in HHFNC. Pt requires min to mod assist with second person for safety and lines for mobility and set up to total assist for ADLs. Recommending [facility] for further rehab prior to return home with his supportive wife.      Recommendations for follow up therapy are one component of a multi-disciplinary discharge planning process, led by the attending physician.  Recommendations may be updated based on patient status, additional functional criteria and insurance authorization.   Follow Up Recommendations  Acute inpatient rehab (3hours/day)     Assistance Recommended at Discharge Frequent or constant Supervision/Assistance  Patient can return home with the following Two people to help with walking and/or transfers;Two people to help with bathing/dressing/bathroom;Assistance with cooking/housework;Direct supervision/assist for financial management;Assist for transportation;Help with stairs or ramp for entrance    Functional Status Assessment  Patient has had a recent decline in their functional status and demonstrates the ability to make significant improvements in function in a reasonable and predictable amount of time.  Equipment Recommendations  BSC/3in1    Recommendations for Other Services       Precautions / Restrictions Precautions Precautions: Fall Precaution Comments: HHFNC 60L, 51%  FiO2 Restrictions Weight Bearing Restrictions: No      Mobility Bed Mobility Overal bed mobility: Needs Assistance Bed Mobility: Supine to Sit, Sit to Supine     Supine to sit: Min assist, +2 for physical assistance Sit to supine: +2 for physical assistance, Mod assist   General bed mobility comments: assist for LE progression to EOB, trunk lowering and positioning once returned to supine.    Transfers Overall transfer level: Needs assistance Equipment used: Rolling walker (2 wheels) Transfers: Sit to/from Stand Sit to Stand: Min assist, +2 physical assistance           General transfer comment: assist for power up, steadying once standing, and lateral steps towards HOB. STS x2, tolerance for standing 2 minutes during pericare      Balance Overall balance assessment: Needs assistance   Sitting balance-Leahy Scale: Fair     Standing balance support: During functional activity, Bilateral upper extremity supported Standing balance-Leahy Scale: Poor                             ADL either performed or assessed with clinical judgement   ADL Overall ADL's : Needs assistance/impaired Eating/Feeding: Set up;Sitting;Bed level Eating/Feeding Details (indicate cue type and reason): ice chips Grooming: Wash/dry hands;Sitting;Supervision/safety   Upper Body Bathing: Moderate assistance;Sitting   Lower Body Bathing: Sit to/from stand;Total assistance   Upper Body Dressing : Minimal assistance;Sitting   Lower Body Dressing: Total assistance;Sit to/from stand   Toilet Transfer: Minimal assistance;+2 for safety/equipment;Rolling walker (2 wheels)   Toileting- Clothing Manipulation and Hygiene: Total assistance;+2 for safety/equipment;Sit to/from stand       Functional mobility during ADLs: Minimal assistance;+2 for safety/equipment       Vision Ability to See in  Adequate Light: 0 Adequate Patient Visual Report: No change from baseline       Perception      Praxis      Pertinent Vitals/Pain Pain Assessment Pain Assessment: 0-10 Faces Pain Scale: Hurts a little bit Pain Location: chest Pain Descriptors / Indicators: Tightness Pain Intervention(s): Monitored during session, Repositioned     Hand Dominance Right   Extremity/Trunk Assessment Upper Extremity Assessment Upper Extremity Assessment: Overall WFL for tasks assessed   Lower Extremity Assessment Lower Extremity Assessment: Defer to PT evaluation   Cervical / Trunk Assessment Cervical / Trunk Assessment: Kyphotic   Communication Communication Communication: Expressive difficulties;HOH (due to dyspnea)   Cognition Arousal/Alertness: Awake/alert Behavior During Therapy: WFL for tasks assessed/performed Overall Cognitive Status: Impaired/Different from baseline Area of Impairment: Problem solving                             Problem Solving: Slow processing       General Comments  SpO2 85-95% 60L/51% FiO2, HR 130s throughout session, RR 20-40 breaths/min    Exercises     Shoulder Instructions      Home Living Family/patient expects to be discharged to:: Private residence Living Arrangements: Spouse/significant other Available Help at Discharge: Family;Available 24 hours/day Type of Home: House Home Access: Stairs to enter [employer] of Steps: 1 Entrance Stairs-Rails: None Home Layout: One level     Bathroom Shower/Tub: Teacher, early years/pre: Handicapped height     Home Equipment: None          Prior Functioning/Environment Prior Level of Function : Independent/Modified Independent;Driving                        OT Problem List: Decreased activity tolerance;Decreased strength;Impaired balance (sitting and/or standing);Decreased knowledge of use of DME or AE;Obesity;Cardiopulmonary status limiting activity      OT Treatment/Interventions: Self-care/ADL training;DME and/or AE instruction;Therapeutic  activities;Patient/family education;Balance training;Cognitive remediation/compensation;Energy conservation    OT Goals(Current goals can be found in the care plan section) Acute Rehab OT Goals OT Goal Formulation: With patient Time For Goal Achievement: 07/28/22 Potential to Achieve Goals: Good ADL Goals Pt Will Perform Grooming: with supervision;standing Pt Will Perform Lower Body Bathing: with min assist;sit to/from stand Pt Will Perform Lower Body Dressing: with min assist;sit to/from stand Pt Will Transfer to Toilet: with supervision;ambulating;bedside commode Pt Will Perform Toileting - Clothing Manipulation and hygiene: with supervision;sit to/from stand Additional ADL Goal #1: Pt will generalize energy conservation and pursed lip breathing technique in ADLs and mobility.  OT Frequency: Min 2X/week    Co-evaluation PT/OT/SLP Co-Evaluation/Treatment: Yes Reason for Co-Treatment: For patient/therapist safety;Complexity of the patient's impairments (multi-system involvement) PT goals addressed during session: Mobility/safety with mobility;Balance OT goals addressed during session: ADL's and self-care      AM-PAC OT "6 Clicks" Daily Activity     Outcome Measure Help from another person eating meals?: None Help from another person taking care of personal grooming?: A Little Help from another person toileting, which includes using toliet, bedpan, or urinal?: Total Help from another person bathing (including washing, rinsing, drying)?: A Lot Help from another person to put on and taking off regular upper body clothing?: A Little Help from another person to put on and taking off regular lower body clothing?: Total 6 Click Score: 14   End of Session Equipment Utilized During Treatment: Oxygen;Rolling walker (2 wheels) Nurse Communication: Mobility status  Activity  Tolerance: Patient tolerated treatment well Patient left: in bed;with call bell/phone within reach;with bed alarm  set;with family/visitor present;with nursing/sitter in room  OT Visit Diagnosis: Unsteadiness on feet (R26.81);Other abnormalities of gait and mobility (R26.89);Muscle weakness (generalized) (M62.81);Other symptoms and signs involving cognitive function                Time: 1100-1129 OT Time Calculation (min): 29 min Charges:  OT General Charges $OT Visit: 1 Visit OT Evaluation $OT Eval Moderate Complexity: [place], OTR/L Acute Rehabilitation Services Office: [phone]   Malka So 07/14/2022, 1:20 PM

## 2022-07-14 NOTE — Progress Notes (Signed)
  Interdisciplinary Goals of Care Family Meeting   Date carried out:: 07/14/2022  Location of the meeting: Bedside  Member's involved: Physician, Bedside Registered Nurse, and Family Member or next of kin  Durable Power of Attorney or acting medical decision maker: Pt    Discussion: We discussed goals of care for [name] with his wife at bedside. He would not want even time-limited trial of intubation. In event of cardiac arrest he does not want chest compressions, shocks, ACLS medications.  Code status: Full DNR  Disposition: Continue current acute care   Time spent for the meeting: 15 minutes  Maryjane Hurter 07/14/2022, 11:20 AM

## 2022-07-14 NOTE — TOC Initial Note (Signed)
Transition of Care [facility]) - Initial/Assessment Note    Patient Details  Name: [name] MRN: [id number] Date of Birth: 10/08/1955  Transition of Care [facility]) CM/SW Contact:    Tom-Johnson, Renea Ee, RN Phone Number: 07/14/2022, 2:04 PM  Clinical Narrative:                  CM spoke with patient and spouse, [name] at bedside about needs for post hospital transition. Patient on BIPAP during this assessment.  Admitted for ARF with Hypoxia, found to be RSV positive.  Patient went into Cardiac arrest and was intubated, extubated on 07/10/22 but still requiring BiPAP support.  On IV abx, Precedex, Amiodarone, Eliquis for A-Flutter- Cardiology following. Has Hx of Lung and Colon Ca.  Needles meeting with wife today, patient now DNR, Palliative following.  From home with wife, no biological children. Independent prior to admission. Home O2 @ 3-4L from [place].  PCP is Scherrie Bateman and uses [facility] on [address] in [place].  CIR recommended, awaiting assessment. CM will continue to follow.           Expected Discharge Plan: IP Rehab Facility Barriers to Discharge: Continued Medical Work up   Patient Goals and CMS Choice Patient states their goals for this hospitalization and ongoing recovery are:: To go to rehab and return home CMS Medicare.gov Compare Post Acute Care list provided to:: Patient Choice offered to / list presented to : Patient, Spouse [name])      Expected Discharge Plan and Services   Discharge Planning Services: CM Consult Post Acute Care Choice: IP Rehab Living arrangements for the past 2 months: Single Family Home                                      Prior Living Arrangements/Services Living arrangements for the past 2 months: Single Family Home Lives with:: Spouse Patient language and need for interpreter reviewed:: Yes        Need for Family Participation in Patient Care: Yes (Comment) Care giver support  system in place?: Yes (comment)   Criminal Activity/Legal Involvement Pertinent to Current Situation/Hospitalization: No - Comment as needed  Activities of Daily Living      Permission Sought/Granted Permission sought to share information with : Case Manager, Customer service manager, Family Supports Permission granted to share information with : Yes, Verbal Permission Granted              Emotional Assessment Appearance:: Appears stated age Attitude/Demeanor/Rapport: Lethargic Affect (typically observed): Accepting, Calm, Hopeful Orientation: : Oriented to Self, Oriented to Place, Oriented to Situation Alcohol / Substance Use: Not Applicable Psych Involvement: No (comment)  Admission diagnosis:  Acute respiratory failure with hypoxia and hypercapnia ([place]) [J96.01, J96.02] Patient Active Problem List   Diagnosis Date Noted   Malnutrition of moderate degree 07/13/2022   Acute respiratory failure with hypoxia and hypercapnia (HCC) 07/09/2022   Sepsis ([place]) 07/09/2022   RSV infection 07/09/2022   Hyperkalemia 07/09/2022   Elevated troponin 07/09/2022   Swelling of lower extremity 07/09/2022   Metastatic cancer ([place]) 07/09/2022   Metastasis to adrenal gland ([place]) 06/23/2022   DOE (dyspnea on exertion) 03/03/2022   Encounter for antineoplastic immunotherapy 11/24/2021   Stroke (cerebrum) ([place]) 11/01/2021   Encounter for antineoplastic chemotherapy 09/20/2021   Stage III squamous cell carcinoma of left lung ([place]) 09/01/2021   Adenopathy 08/26/2021   Pulmonary infiltrates on  CXR 07/09/2021   Chronic respiratory failure with hypoxia and hypercapnia (HCC) 07/08/2021   Cigarette smoker 07/08/2021   Acute on chronic heart failure with preserved ejection fraction (HFpEF) /Diastolic Dysfunction CHF 25/09/3974   Moderate pulmonary hypertension ([place]) 06/02/2021   Type 2 diabetes mellitus ([place]) 06/01/2021   Atrial flutter ([place]) 05/31/2021   Acute heart failure ([place]) 05/31/2021    Hyperglycemia 05/31/2021   Elevated brain natriuretic peptide (BNP) level 05/31/2021   Hypertensive crisis    Coronary artery calcification seen on CT scan 02/02/2018   Chest pain 02/01/2018   Tobacco abuse 02/01/2018   COPD with acute exacerbation ([place]) 02/01/2018   Hyperlipidemia 02/01/2018   History of Colon cancer 04/2013   PCP:  [name] Samples, PA-C Pharmacy:   [facility] [place], [place] [address] [place] [address] Phone: [phone] Fax: [phone]     Social Determinants of Health (SDOH) Social History: SDOH Screenings   Food Insecurity: No Food Insecurity (01/14/2022)  Housing: Low Risk  (01/14/2022)  Transportation Needs: No Transportation Needs (01/14/2022)  Financial Resource Strain: Medium Risk (01/14/2022)  Tobacco Use: High Risk (07/09/2022)   SDOH Interventions: Transportation Interventions: Intervention Not Indicated, Inpatient TOC, Patient Resources (Friends/Family)   Readmission Risk Interventions    06/01/2021   11:52 AM  Readmission Risk Prevention Plan  Post Dischage Appt Complete  Medication Screening Complete  Transportation Screening Complete

## 2022-07-15 ENCOUNTER — Ambulatory Visit: Payer: Medicare HMO | Admitting: Radiation Oncology

## 2022-07-15 ENCOUNTER — Ambulatory Visit: Payer: Medicare HMO

## 2022-07-15 DIAGNOSIS — Z515 Encounter for palliative care: Secondary | ICD-10-CM | POA: Diagnosis not present

## 2022-07-15 DIAGNOSIS — Z7189 Other specified counseling: Secondary | ICD-10-CM | POA: Diagnosis not present

## 2022-07-15 DIAGNOSIS — C3490 Malignant neoplasm of unspecified part of unspecified bronchus or lung: Secondary | ICD-10-CM

## 2022-07-15 DIAGNOSIS — J9601 Acute respiratory failure with hypoxia: Secondary | ICD-10-CM | POA: Diagnosis not present

## 2022-07-15 DIAGNOSIS — J9602 Acute respiratory failure with hypercapnia: Secondary | ICD-10-CM | POA: Diagnosis not present

## 2022-07-15 LAB — BASIC METABOLIC PANEL
Anion gap: 12 (ref 5–15)
BUN: 71 mg/dL — ABNORMAL HIGH (ref 8–23)
CO2: 40 mmol/L — ABNORMAL HIGH (ref 22–32)
Calcium: 10 mg/dL (ref 8.9–10.3)
Chloride: 94 mmol/L — ABNORMAL LOW (ref 98–111)
Creatinine, Ser: 1.13 mg/dL (ref 0.61–1.24)
GFR, Estimated: >60 mL/min (ref >60)
Glucose, Bld: 176 mg/dL — ABNORMAL HIGH (ref 70–99)
Potassium: 4.3 mmol/L (ref 3.5–5.1)
Sodium: 146 mmol/L — ABNORMAL HIGH (ref 135–145)

## 2022-07-15 LAB — GLUCOSE, CAPILLARY
Glucose-Capillary: 161 mg/dL — ABNORMAL HIGH (ref 70–99)
Glucose-Capillary: 220 mg/dL — ABNORMAL HIGH (ref 70–99)
Glucose-Capillary: 242 mg/dL — ABNORMAL HIGH (ref 70–99)
Glucose-Capillary: 263 mg/dL — ABNORMAL HIGH (ref 70–99)
Glucose-Capillary: 266 mg/dL — ABNORMAL HIGH (ref 70–99)
Glucose-Capillary: 353 mg/dL — ABNORMAL HIGH (ref 70–99)

## 2022-07-15 LAB — PHOSPHORUS: Phosphorus: 5.8 mg/dL — ABNORMAL HIGH (ref 2.5–4.6)

## 2022-07-15 LAB — MAGNESIUM: Magnesium: 2.6 mg/dL — ABNORMAL HIGH (ref 1.7–2.4)

## 2022-07-15 MED ORDER — METOLAZONE 5 MG PO TABS
5.0000 mg | ORAL_TABLET | Freq: Once | ORAL | Status: AC
  Administered 2022-07-15: 5 mg via ORAL
  Filled 2022-07-15: qty 1

## 2022-07-15 MED ORDER — FUROSEMIDE 10 MG/ML IJ SOLN
60.0000 mg | Freq: Once | INTRAMUSCULAR | Status: AC
  Administered 2022-07-15: 60 mg via INTRAVENOUS
  Filled 2022-07-15: qty 6

## 2022-07-15 MED ORDER — PREDNISONE 20 MG PO TABS
20.0000 mg | ORAL_TABLET | Freq: Every day | ORAL | Status: DC
  Administered 2022-07-15 – 2022-07-16 (×2): 20 mg
  Filled 2022-07-15 (×2): qty 1

## 2022-07-15 MED ORDER — METHYLPREDNISOLONE SODIUM SUCC 40 MG IJ SOLR: 20.0000 mg | Freq: Every day | INTRAMUSCULAR | Status: DC

## 2022-07-15 MED ORDER — ORAL CARE MOUTH RINSE: 15.0000 mL | OROMUCOSAL | Status: DC | PRN

## 2022-07-15 NOTE — Progress Notes (Signed)
  Still with flutter 130's Continue amiodarone On metoprolol as well  Appreciate palliative team.  Will see again Monday.  Please call if questions.   Candee Furbish, MD

## 2022-07-15 NOTE — TOC Progression Note (Signed)
Transition of Care [facility]) - Initial/Assessment Note    Patient Details  Name: [name] MRN: [id number] Date of Birth: Dec 03, 1955  Transition of Care [facility]) CM/SW Contact:    Milinda Antis, [place] Phone Number: 07/15/2022, 11:05 AM  Clinical Narrative:                 LCSW received a consult for LTACH placement and contacted the patient's spouse to present choice.  There was no answer.  LCSW left a voicemail requesting a returned call.  TOC following.   Expected Discharge Plan: IP Rehab Facility Barriers to Discharge: Continued Medical Work up   Patient Goals and CMS Choice Patient states their goals for this hospitalization and ongoing recovery are:: To go to rehab and return home CMS Medicare.gov Compare Post Acute Care list provided to:: Patient Choice offered to / list presented to : Patient, Spouse [name])      Expected Discharge Plan and Services   Discharge Planning Services: CM Consult Post Acute Care Choice: IP Rehab Living arrangements for the past 2 months: Single Family Home                                      Prior Living Arrangements/Services Living arrangements for the past 2 months: Single Family Home Lives with:: Spouse Patient language and need for interpreter reviewed:: Yes        Need for Family Participation in Patient Care: Yes (Comment) Care giver support system in place?: Yes (comment)   Criminal Activity/Legal Involvement Pertinent to Current Situation/Hospitalization: No - Comment as needed  Activities of Daily Living      Permission Sought/Granted Permission sought to share information with : Case Manager, Customer service manager, Family Supports Permission granted to share information with : Yes, Verbal Permission Granted              Emotional Assessment Appearance:: Appears stated age Attitude/Demeanor/Rapport: Lethargic Affect (typically observed): Accepting, Calm, Hopeful Orientation: : Oriented to  Self, Oriented to Place, Oriented to Situation Alcohol / Substance Use: Not Applicable Psych Involvement: No (comment)  Admission diagnosis:  Acute respiratory failure with hypoxia and hypercapnia (HCC) [J96.01, J96.02] Patient Active Problem List   Diagnosis Date Noted   Malnutrition of moderate degree 07/13/2022   Acute respiratory failure with hypoxia and hypercapnia (HCC) 07/09/2022   Sepsis ([place]) 07/09/2022   RSV infection 07/09/2022   Hyperkalemia 07/09/2022   Elevated troponin 07/09/2022   Swelling of lower extremity 07/09/2022   Metastatic cancer ([place]) 07/09/2022   Metastasis to adrenal gland ([place]) 06/23/2022   DOE (dyspnea on exertion) 03/03/2022   Encounter for antineoplastic immunotherapy 11/24/2021   Stroke (cerebrum) ([place]) 11/01/2021   Encounter for antineoplastic chemotherapy 09/20/2021   Stage III squamous cell carcinoma of left lung ([place]) 09/01/2021   Adenopathy 08/26/2021   Pulmonary infiltrates on CXR 07/09/2021   Chronic respiratory failure with hypoxia and hypercapnia ([place]) 07/08/2021   Cigarette smoker 07/08/2021   Acute on chronic heart failure with preserved ejection fraction (HFpEF) /Diastolic Dysfunction CHF 0000000   Moderate pulmonary hypertension ([place]) 06/02/2021   Type 2 diabetes mellitus ([place]) 06/01/2021   Atrial flutter ([place]) 05/31/2021   Acute heart failure ([place]) 05/31/2021   Hyperglycemia 05/31/2021   Elevated brain natriuretic peptide (BNP) level 05/31/2021   Hypertensive crisis    Coronary artery calcification seen on CT scan 02/02/2018   Chest pain 02/01/2018   Tobacco abuse  02/01/2018   COPD with acute exacerbation ([place]) 02/01/2018   Hyperlipidemia 02/01/2018   History of Colon cancer 04/2013   PCP:  [name] Samples, PA-C Pharmacy:   [facility] [place], [place] [id number] [place] [address] Phone: [phone] Fax: [phone]     Social  Determinants of Health (SDOH) Social History: SDOH Screenings   Food Insecurity: No Food Insecurity (01/14/2022)  Housing: Low Risk  (01/14/2022)  Transportation Needs: No Transportation Needs (01/14/2022)  Financial Resource Strain: Medium Risk (01/14/2022)  Tobacco Use: High Risk (07/09/2022)   SDOH Interventions: Transportation Interventions: Intervention Not Indicated, Inpatient TOC, Patient Resources (Friends/Family)   Readmission Risk Interventions    06/01/2021   11:52 AM  Readmission Risk Prevention Plan  Post Dischage Appt Complete  Medication Screening Complete  Transportation Screening Complete

## 2022-07-15 NOTE — Progress Notes (Signed)
Palliative:  HPI: 67 y.o. male with medical history significant of atrial flutter, HFpEF, hypertension, colon cancer (in remission), lung cancer (recent adrenal mets plans for rad onc), COPD presents the ED with a chief complaint of dyspnea related to RSV in the setting of lung cancer, COPD, CHF exacerbation. Cardiac arrest 07/09/22 ROSC after 1 round CPR and intubated. Extubated 07/10/22 but still requiring BiPAP support.    Received update from RN: improvement in respiratory status. Did not need bipap overnight. Tolerating HFNC. Visited with Nicole Kindred - he reports the same. Conversation somewhat limited - he is a bit drowsy. No family at bedside.   Called to wife [name].  She has seen Nicole Kindred this morning and also feels he is improving - says this was her understanding from her converstaion with the physician this morning as well. We review conversation with physician yesterday evening - discuss that Nicole Kindred has made the decision for no further intubations. [name] shares this is not the decision she would've made but she is committed to supporting the decisions Nicole Kindred is making for himself and will standby his decision. I discussed on the palliative team continue to follow and she is agreeable to this.  All questions/concerns addressed. Emotional support provided.   Exam: A bit drowsy but wakes easily to voice, oriented. Tolerating HFNC. HR irreg 130s. Abd distended but soft.  Plan: - Ongoing palliative support and conversations.  - DNR/DNI as of conversation with Dr. Verlee Monte 2/23   Juel Burrow, [place], [facility] Palliative Medicine Team Team Phone # [phone]  Pager # [phone]

## 2022-07-15 NOTE — Progress Notes (Signed)
NAME:  [name], MRN:  [id number], DOB:  Nov 01, 1955, LOS: 6 ADMISSION DATE:  06/28/2022, CONSULTATION DATE:  07/09/22 REFERRING MD: Vanita Panda CHIEF COMPLAINT:  SOB   History of Present Illness:  67 year old male with pmhx of atrial flutter, HFpEF, HTN, Lung cancer, Colon Cancer and presented to [facility] with SOB, increased WOB with O2 Sats in the 80s requiring BIPAP. Patient was positive for RSV upon admission. Patient was admitted to the ICU, where at shift change on 2/17 patient developed a cardiac arrest after standing and collapsing. CPR was initiated and performed for 1 round, intubated for airway protection, and found to have a vfib which one shock was given converting to sinus tachycardia. Patient was transferred to [facility] for further management and care.   Pertinent  Medical History   Past Medical History:  Diagnosis Date   (HFpEF) heart failure with preserved ejection fraction ([place])    TTE 06/01/2021: EF 60-65, no RWMA, mild LVH, mild reduced RVSF, moderate pulm hypertension (RVSP 47.3), mild BAE, trivial MR, moderate TR, AV sclerosis, aortic root 41 mm, RAP 15   Atrial flutter (HCC)    s/p DCCV 06/03/21   Chest pain    Neg nuclear stress test in 2019   Colon cancer ([place]) 04/22/2013   COPD (chronic obstructive pulmonary disease) ([place])    Dilated aortic root ([place])    TTE 05/2021: 4.1 cm   Hypertension    Lung cancer ([place]) 08/26/2021   Renal disorder    Kidney stones     Significant Hospital Events: Including procedures, antibiotic start and stop dates in addition to other pertinent events   2/16 Admitted for respiratory failure requiring BIPAP, + for RSV 2/17 V-fib cardiac arrest, intubated, [facility] from [facility]  2/18 extubated/ bipap  2/19 ongoing afib/ SVT on dilt  Interim History / Subjective:  No acute issues overnight  Changed to conventional high flow nasal cannula  Net negative 1L yesterday  Objective   Blood pressure (!) 132/98, pulse (!) 135, temperature  97.9 F (36.6 C), temperature source Oral, resp. rate (!) 26, height '6\' 1"'$  (1.854 m), weight 112.5 kg, SpO2 94 %. CVP:  [8 mmHg-34 mmHg] 14 mmHg  FiO2 (%):  [97 %] 97 %   Intake/Output Summary (Last 24 hours) at 07/15/2022 0955 Last data filed at 07/15/2022 0900 Gross per 24 hour  Intake 2083.79 ml  Output 3000 ml  Net -916.21 ml   Filed Weights   07/13/22 0428 07/14/22 0500 07/15/22 0358  Weight: 110 kg 109.6 kg 112.5 kg    Examination: General:  Chronically ill appearing on HFNC Neuro: Awake, alert, grossly nonfocal CV: regular tachy, no murmur PULM:  insp wheeze bl, diminished GI: obese,  distended, +bs, soft, NT Extremities: lukewarm 1+ pitting edema, L foot > R foot   Na 146 S Cr 1.13  2/17 Bcx2> ngtd 2/17 MRSA PCR > neg 2/16 azithro/ ctx >   Resolved Hospital Problem list   N/a   Assessment & Plan:  Vfib cardiac arrest- in context of AECOPD and RSV pneumonia;  Acute on chronic hypoxemic and hypercarbic respiratory failure- due to RSV pneumonia.  On CAP coverage.  Extubated 2/18 but tenuous.  Pushing diuresis seems to be helping but he is back in a tachyarrhythmia. Probable type 2 NSTEMI: echo preserved Advanced COPD in flare Stage IV lung cancer mets to adrenal gland Afluter/ Afib- recurrent, recalcitrant to dilt Abdominal distension - likely due to aerophagia, possible ileus P:  -  continue diuresis with lasix for net negative fluid balance, metolazone in setting hypernatremia - cardiology following, continue amio, metop q6 - eliquis 5 bid - continue NIV prn, HFNC  - PT/chair position - goal SpO2 88-94%, not unexpected to have some brief desaturations with exertion.  Will likely be a slow recovery from RSV/ AECOPD/ CAP.   - continue nebs  - solumedrol 40 mg daily, taper down to 20 mg daily prednisone today(on it more or less chronically) - adjust basal bolus insulin as needed - appreciate ongoing PMT assistance with GOC - SBRT when more stable - sp  ceftriaxone 7 day course - place peripheral and dc central line   Best Practice (right click and "Reselect all SmartList Selections" daily)   Diet/type:  TF DVT prophylaxis: DOAC GI prophylaxis: PPI Lines: keep for now Foley:  N/A Code Status:  full code Last date of multidisciplinary goals of care discussion: 2/22, now confirms DNR/DNI  CCT 34 minutes  [place] for pager If no response to pager, please call PCCM consult pager After 7:00 pm call El[place]

## 2022-07-15 NOTE — Progress Notes (Signed)
eLink Physician-Brief Progress Note Patient Name: ALVA MIXELL DOB: 06-02-1955 MRN: [id number]   Date of Service  07/15/2022  HPI/Events of Note  67 year old male with a history of advanced stage IV lung cancer with adrenal metastasis, V-fib cardiac arrest in the setting of acute on chronic hypoxemic and hypercapnic respiratory failure with an acute COPD flare.  Bedside alarm was ringing ST elevation, EKG was performed, shows that the patient is then atrial fibs/flutter with QTc prolongation.  Has incomplete right bundle branch block.  Compared to recent EKGs, no significant change.  Remains on amiodarone drip  eICU Interventions  No intervention is indicated at this time.  Will await a.m. electrolytes and replace as necessary.  Aside from amiodarone, and no obvious QT prolonging drugs are noted.  Maintain current care.     Intervention Category Minor Interventions: Clinical assessment - ordering diagnostic tests  Wendie Diskin 07/15/2022, 1:03 AM

## 2022-07-16 ENCOUNTER — Inpatient Hospital Stay (HOSPITAL_COMMUNITY): Payer: Medicare HMO

## 2022-07-16 DIAGNOSIS — Z7189 Other specified counseling: Secondary | ICD-10-CM | POA: Diagnosis not present

## 2022-07-16 DIAGNOSIS — J9601 Acute respiratory failure with hypoxia: Secondary | ICD-10-CM | POA: Diagnosis not present

## 2022-07-16 DIAGNOSIS — Z515 Encounter for palliative care: Secondary | ICD-10-CM | POA: Diagnosis not present

## 2022-07-16 DIAGNOSIS — J9602 Acute respiratory failure with hypercapnia: Secondary | ICD-10-CM | POA: Diagnosis not present

## 2022-07-16 LAB — BASIC METABOLIC PANEL
Anion gap: 10 (ref 5–15)
BUN: 59 mg/dL — ABNORMAL HIGH (ref 8–23)
CO2: 44 mmol/L — ABNORMAL HIGH (ref 22–32)
Calcium: 9.8 mg/dL (ref 8.9–10.3)
Chloride: 86 mmol/L — ABNORMAL LOW (ref 98–111)
Creatinine, Ser: 0.95 mg/dL (ref 0.61–1.24)
GFR, Estimated: >60 mL/min (ref >60)
Glucose, Bld: 228 mg/dL — ABNORMAL HIGH (ref 70–99)
Potassium: 4.6 mmol/L (ref 3.5–5.1)
Sodium: 140 mmol/L (ref 135–145)

## 2022-07-16 LAB — MAGNESIUM: Magnesium: 2.3 mg/dL (ref 1.7–2.4)

## 2022-07-16 LAB — CBC
HCT: 48.2 % (ref 39.0–52.0)
Hemoglobin: 15.1 g/dL (ref 13.0–17.0)
MCH: 33.3 pg (ref 26.0–34.0)
MCHC: 31.3 g/dL (ref 30.0–36.0)
MCV: 106.4 fL — ABNORMAL HIGH (ref 80.0–100.0)
Platelets: 159 10*3/uL (ref 150–400)
RBC: 4.53 MIL/uL (ref 4.22–5.81)
RDW: 13.8 % (ref 11.5–15.5)
WBC: 19.5 10*3/uL — ABNORMAL HIGH (ref 4.0–10.5)
nRBC: 0 % (ref 0.0–0.2)

## 2022-07-16 LAB — GLUCOSE, CAPILLARY
Glucose-Capillary: 170 mg/dL — ABNORMAL HIGH (ref 70–99)
Glucose-Capillary: 279 mg/dL — ABNORMAL HIGH (ref 70–99)
Glucose-Capillary: 318 mg/dL — ABNORMAL HIGH (ref 70–99)
Glucose-Capillary: 256 mg/dL — ABNORMAL HIGH (ref 70–99)

## 2022-07-16 LAB — BRAIN NATRIURETIC PEPTIDE: B Natriuretic Peptide: 203 pg/mL — ABNORMAL HIGH (ref 0.0–100.0)

## 2022-07-16 MED ORDER — HALOPERIDOL LACTATE 5 MG/ML IJ SOLN
2.5000 mg | INTRAMUSCULAR | Status: DC | PRN
  Administered 2022-07-16: 5 mg via INTRAVENOUS
  Filled 2022-07-16: qty 1

## 2022-07-16 MED ORDER — ACETAMINOPHEN 650 MG RE SUPP: 650.0000 mg | Freq: Four times a day (QID) | RECTAL | Status: DC | PRN

## 2022-07-16 MED ORDER — GLYCOPYRROLATE 0.2 MG/ML IJ SOLN
0.2000 mg | INTRAMUSCULAR | Status: DC | PRN
  Administered 2022-07-16 – 2022-07-17 (×2): 0.2 mg via INTRAVENOUS
  Filled 2022-07-16 (×2): qty 1

## 2022-07-16 MED ORDER — POLYVINYL ALCOHOL 1.4 % OP SOLN: 1.0000 [drp] | Freq: Four times a day (QID) | OPHTHALMIC | Status: DC | PRN

## 2022-07-16 MED ORDER — MORPHINE 100MG IN NS 100ML (1MG/ML) PREMIX INFUSION
0.0000 mg/h | INTRAVENOUS | Status: DC
  Administered 2022-07-16: 5 mg/h via INTRAVENOUS
  Filled 2022-07-16: qty 100

## 2022-07-16 MED ORDER — GLYCOPYRROLATE 1 MG PO TABS: 1.0000 mg | ORAL_TABLET | ORAL | Status: DC | PRN

## 2022-07-16 MED ORDER — GLYCOPYRROLATE 0.2 MG/ML IJ SOLN: 0.2000 mg | INTRAMUSCULAR | Status: DC | PRN

## 2022-07-16 MED ORDER — VANCOMYCIN HCL IN DEXTROSE 1-5 GM/200ML-% IV SOLN: 1000.0000 mg | Freq: Two times a day (BID) | INTRAVENOUS | Status: DC

## 2022-07-16 MED ORDER — SODIUM CHLORIDE 0.9 % IV SOLN: INTRAVENOUS | Status: DC

## 2022-07-16 MED ORDER — ENSURE ENLIVE PO LIQD: 237.0000 mL | Freq: Two times a day (BID) | ORAL | Status: DC

## 2022-07-16 MED ORDER — VANCOMYCIN HCL 2000 MG/400ML IV SOLN
2000.0000 mg | Freq: Once | INTRAVENOUS | Status: DC
  Filled 2022-07-16: qty 400

## 2022-07-16 MED ORDER — SODIUM CHLORIDE 0.9 % IV SOLN: 2.0000 g | Freq: Three times a day (TID) | INTRAVENOUS | Status: DC

## 2022-07-16 MED ORDER — MORPHINE BOLUS VIA INFUSION
5.0000 mg | INTRAVENOUS | Status: DC | PRN
  Filled 2022-07-16: qty 5

## 2022-07-16 MED ORDER — AMIODARONE IV BOLUS ONLY 150 MG/100ML
150.0000 mg | INTRAVENOUS | Status: DC | PRN
  Administered 2022-07-16: 150 mg via INTRAVENOUS

## 2022-07-16 MED ORDER — ADULT MULTIVITAMIN W/MINERALS CH: 1.0000 | ORAL_TABLET | Freq: Every day | ORAL | Status: DC

## 2022-07-16 MED ORDER — MORPHINE SULFATE (PF) 4 MG/ML IV SOLN
4.0000 mg | INTRAVENOUS | Status: DC | PRN
  Administered 2022-07-16 (×2): 4 mg via INTRAVENOUS
  Filled 2022-07-16 (×2): qty 1

## 2022-07-16 MED ORDER — FUROSEMIDE 10 MG/ML IJ SOLN
60.0000 mg | Freq: Once | INTRAMUSCULAR | Status: AC
  Administered 2022-07-16: 60 mg via INTRAVENOUS
  Filled 2022-07-16: qty 6

## 2022-07-16 MED ORDER — OSMOLITE 1.5 CAL PO LIQD
780.0000 mL | ORAL | Status: DC
  Filled 2022-07-16 (×2): qty 948

## 2022-07-16 MED ORDER — ACETAMINOPHEN 325 MG PO TABS: 650.0000 mg | ORAL_TABLET | Freq: Four times a day (QID) | ORAL | Status: DC | PRN

## 2022-07-16 NOTE — Progress Notes (Signed)
Bilateral lower extremities changed in coloration through shift. At beginning of shift bilateral feet were mottled and now are purple in color. Attending made aware.

## 2022-07-16 NOTE — Progress Notes (Addendum)
Nutrition Follow-up  DOCUMENTATION CODES:   Non-severe (moderate) malnutrition in context of chronic illness  INTERVENTION:   -48 hour calorie count to determine if pt able to eat adequate PO's to support removal of cortrak tube; current recommendation is to d/c TF after pt able to demonstrate consuming >75% of estimated needs via PO's consistently; RD to follow-up on Monday, 07/18/22 -Continue regular diet -Ensure Enlive po BID, each supplement provides 350 kcal and 20 grams of protein -MVI with minerals daily -Transition to nocturnal feedings via cortrak:  Osmolite 1.5 @ 65 ml/hr over 12 hour period (2000-0800)  60 ml Prosource TF BID  Tube feeding regimen provides 1330 kcal (55% of needs), 89 grams of protein, and 594 ml of H2O.    -Messaged DM coordinator for insulin recommendations secondary to hyperglycemia (likely 2/2 steroids) and transition to nocturnal feedings  NUTRITION DIAGNOSIS:   Moderate Malnutrition related to chronic illness (cancer) as evidenced by mild fat depletion, mild muscle depletion, moderate fat depletion.  Ongoing  GOAL:   Patient will meet greater than or equal to 90% of their needs  Progressing   MONITOR:   TF tolerance, Diet advancement, Skin, I & O's, Labs  REASON FOR ASSESSMENT:   Consult Enteral/tube feeding initiation and management  ASSESSMENT:   Pt with hx of HTN, CHF, hx colon cancer 2014 s/p partial colectomy, COPD, lung cancer stage 3, dx April 2023, daily EtOH use, and tobacco abuse presented to ED with respiratory distress.  2/17 - [place] called in [facility] ED, received CPR and intubated, transferred to [facility] 2/18 - extubated  2/21 - cortrak placed, tip terminates in the fourth portion of the duodenum 2/24- s/p BSE- advanced to regular diet with thin liquids  Reviewed I/O's: -7 ml x 24 hours and -9.8 L since admission  UOP: 650 ml x 24 hours   Pt unavailable at time of visit. Attempted to speak with pt via call to  hospital room phone, however, unable to reach.   Noted that TF has reduced to 32 ml/hr earlier today per MD. RD will transition pt to nocturnal feedings this evening to help stimulate appetite as pt was just placed on a PO diet earlier today.   Reviewed wt hx; pt has experienced a 10.8% wt loss over the past week. Pt remains with mild edema and is -9.8 L since admission. Suspect wt loss is likely related to fluid loss.   Palliative care following for goals of care. Pt is a DNR/ DNI. Pt has a desire to eat and accepts aspiration risk. Pt may transition to comfort care if he declines.   Case discussed with RN and MD.   Medications reviewed and include thiamine, amiodarone, and prednisone.   Labs reviewed: K and Mg WDL. Phos: 5.8. CBGS: 318 (inpatient orders for glycemic control are 0-15 units insulin aspart every 4 hours and 10 units insulin detemir BID).  Suspect hyperglycemia is related to steroids, as well as inadequate insulin regimen on TF. RD will message DM coordinator via secure chat for insulin recommendations due to hyperglycemia and transitioning to nocturnal feedings.   Diet Order:   Diet Order             Diet regular Fluid consistency: Thin  Diet effective now                   EDUCATION NEEDS:   Education needs have been addressed  Skin:  Skin Assessment: Reviewed RN Assessment (open blister to the buttocks)  Last BM:  2/21 - type 6  Height:   Ht Readings from Last 1 Encounters:  07/09/22 '6\' 1"'$  (1.854 m)    Weight:   Wt Readings from Last 1 Encounters:  07/16/22 109.8 kg    Ideal Body Weight:  83.6 kg  BMI:  Body mass index is 31.93 kg/m.  Estimated Nutritional Needs:   Kcal:  2400-2600 kcal/d  Protein:  120-145g/d  Fluid:  2.2-2.4L/d    Loistine Chance, RD, LDN, [place] Registered Dietitian II Certified Diabetes Care and Education Specialist Please refer to [facility] for RD and/or RD on-call/weekend/after hours pager

## 2022-07-16 NOTE — Progress Notes (Signed)
Remains in [place] with HR 130s. Will continue current medications with amiodarone gtt and metoprolol; can uptitrate metoprolol dose if needed pending BP although given that he is Aflutter with HR stably in the 130s over the past several days, likely  this will provide much additional rate control.  Followed by Palliative as well  Will see back on Monday. Please call if questions or concerns.  Gwyndolyn Kaufman, MD

## 2022-07-16 NOTE — Progress Notes (Signed)
Paged cardiology to advise pt status.

## 2022-07-16 NOTE — Significant Event (Signed)
Rapid Response Event Note   Reason for Call :  Increased oxygen needs/work of breathing  Initial Focused Assessment:  Patient sitting up in bed, A&O x2-3. Able to answer questions with mild difficulty d/t dyspnea and WOB.  Heart tones regular. Lung sounds diminished. Skin cool and dry.   1/3 of BLE purple in color/mottled with very weak pulses. Cap refill <3 seconds in all extremities. Tip of nose also noted to be purple, wife states this is typical.   VS 124/97 (106) HR 133 RR 37 O2 93 HHFNC 40L 60%  Interventions:  Lasix '60mg'$  IV x1 MD to bedside PCCM consult  Plan of Care:  Symptom control, orders placed for morphine gtt to start once other family members arrive  Event Summary:  MD Notified: C. Powell MD Call Time: [id number] Arrival Time: [id number] End Time: 1600   ADDENDUM:  Call time: 1658 Arrival: 1700  136/102 (108) HR 157 RR 37 O2 92% 40L 60% HHFNC  Called back to room for sustained HR 150s, pt became slightly agitated, demanding to sit on edge of bed. Shallow respirations, pt experiencing SOB. HR increased to 140s-150s, sustaining in 150s and up to 160s. '150mg'$  Amio bolus ordered per orderset. PRN morphine order obtained.   Newman Nickels, RN

## 2022-07-16 NOTE — Progress Notes (Signed)
PROGRESS NOTE    [name]  [id number] DOB: [birth year] DOA: 07/18/2022 PCP: Cameron Samples, PA-C  Chief Complaint  Patient presents with   Respiratory Distress    Brief Narrative:   67 year old male with pmhx of atrial flutter, HFpEF, HTN, Lung cancer, Colon Cancer and presented to [facility] with SOB, increased WOB with O2 Sats in the 80s requiring BIPAP. Patient was positive for RSV upon admission. Patient was admitted to the ICU, where at shift change on 2/17 patient developed [name] cardiac arrest after standing and collapsing. CPR was initiated and performed for 1 round, intubated for airway protection, and found to have [name] vfib which one shock was given converting to sinus tachycardia. Patient was transferred to [facility] for further management and care.   Significant Events 2/16 Admitted for respiratory failure requiring BIPAP, + for RSV 2/17 V-fib cardiac arrest, intubated, transferred [facility]  2/18 extubated/ bipap  2/19 ongoing afib/ SVT on dilt 2/24 transferred to [facility] - transitioned to comfort measures after discussion with PCCM  Assessment & Plan:   Principal Problem:   Acute respiratory failure with hypoxia and hypercapnia ([name]) Active Problems:   COPD with acute exacerbation (HCC)   Hyperlipidemia   Atrial flutter (HCC)   Hypertensive crisis   Acute heart failure (HCC)   Type 2 diabetes mellitus ([name])   Sepsis ([place])   RSV infection   Hyperkalemia   Elevated troponin   Swelling of lower extremity   Metastatic cancer ([place])   Malnutrition of moderate degree  Goals of care I appreciate critical care's involvement and discussion with patient/wife.  Based on their discussion, will plan for comfort measures after family/friends have had [name] chance to visit.  After this, plan to transition to comfort measures.  Morphine gtt has been ordered by PCCM after family has visited.   Will continue current treatments until family has visited, once that occurs, plan for  comfort measures  Vfib arrest Acute on Chronic Hypoxic and Hypercarbic Respiratory Failure  COPD Exacerbation  RSV pneumonia - extubated 2/18 - was placed on HHFNC today  Type 2 NSTEMI Stage IV Lung Cancer Atrial Flutter - cardiology has been following - amiodarone, eliquis Abdominal Distension - ? Ileus   See previous progress notes for additional details.  Today was placed on HHFNC due to tachypnea/tachycardia.  This was uptitrated to 40 L/min.  CXR ordered and showed worsening patchy opacity in L mid and lower lung zone.  Given lasix.  Considered broadened abx, but now comfort measures.  Critical care was asked to see him and based on their discussion, plans made for comfort measures.     DVT prophylaxis: eliquis Code Status: DNR/DNI Family Communication: wife at bedside Disposition:   Status is: Inpatient Remains inpatient appropriate because: plan for comfort measures   Consultants:  PCCM Palliative care cardiology  Procedures:  Echo IMPRESSIONS     1. Left ventricular ejection fraction, by estimation, is 60 to 65%. The  left ventricle has normal function. The left ventricle has no regional  wall motion abnormalities. There is mild concentric left ventricular  hypertrophy. Left ventricular diastolic  parameters are consistent with Grade II diastolic dysfunction  (pseudonormalization).   2. Right ventricular systolic function is normal. The right ventricular  size is normal. There is normal pulmonary artery systolic pressure.   3. The mitral valve is normal in structure. Trivial mitral valve  regurgitation. No evidence of mitral stenosis.   4. The aortic valve is  calcified. Aortic valve regurgitation is not  visualized. No aortic stenosis is present.   5. Aortic dilatation noted. There is mild dilatation of the aortic root,  measuring 42 mm.   6. The inferior vena cava is normal in size with greater than 50%  respiratory variability, suggesting right atrial  pressure of 3 mmHg.   LE US IMPRESSION: No evidence of deep venous thrombosis in either lower extremity.  Cardioversion   Antimicrobials:  Anti-infectives (From admission, onward)    Start     Dose/Rate Route Frequency Ordered Stop   07-31-2022 0600  vancomycin (VANCOCIN) IVPB 1000 mg/200 mL premix  Status:  Discontinued        1,000 mg 200 mL/hr over 60 Minutes Intravenous Every 12 hours 07/16/22 1607 07/16/22 1613   07/16/22 1700  vancomycin (VANCOREADY) IVPB 2000 mg/400 mL  Status:  Discontinued        2,000 mg 200 mL/hr over 120 Minutes Intravenous  Once 07/16/22 1600 07/16/22 1622   07/16/22 1630  ceFEPIme (MAXIPIME) 2 g in sodium chloride 0.9 % 100 mL IVPB  Status:  Discontinued        2 g 200 mL/hr over 30 Minutes Intravenous Every 8 hours 07/16/22 1600 07/16/22 1613   07/14/22 1000  cefTRIAXone (ROCEPHIN) 2 g in sodium chloride 0.9 % 100 mL IVPB        2 g 200 mL/hr over 30 Minutes Intravenous Every 24 hours 07/13/22 0945 07/15/22 1045   07/09/22 0000  cefTRIAXone (ROCEPHIN) 2 g in sodium chloride 0.9 % 100 mL IVPB        2 g 200 mL/hr over 30 Minutes Intravenous Every 24 hours 07/09/2022 2354 07/13/22 0037   07/09/22 0000  azithromycin (ZITHROMAX) 500 mg in sodium chloride 0.9 % 250 mL IVPB        500 mg 250 mL/hr over 60 Minutes Intravenous Every 24 hours 07/01/2022 2354 07/13/22 0209       Subjective: I saw him Marcile Ramos few times today - he's generally hasn't asked for anything despite looking very ill - earlier in the day, we discussed Makalya Ramos plan to hold the current course (but due to worsening, had PCCM see him). After their conversation with critical care, planning for comfort measures I circled around and spoke to him and wife again - they're agreeable to this plan.  He endorsed agreement.  Wife was more quiet, quiet acknowledgement of plan, but didn't seem to want to talk about it too much.  We discussed plan to continue current measures for now and when additional person gets  here that wants to see him, will transition to comfort measures at that time.    Objective: Vitals:   07/16/22 1230 07/16/22 1255 07/16/22 1455 07/16/22 1635  BP: (!) 117/95 (!) 127/92 (!) 124/97 (!) 136/102  Pulse: (!) 136 (!) 136 (!) 133 (!) 137  Resp: (!) 28  (!) 37 (!) 34  Temp: 98 F (36.7 C)   99 F (37.2 C)  TempSrc: Oral   Oral  SpO2: 92%  93% 93%  Weight:      Height:        Intake/Output Summary (Last 24 hours) at 07/16/2022 1840 Last data filed at 07/16/2022 1625 Gross per 24 hour  Intake 1969.7 ml  Output 1250 ml  Net 719.7 ml   Filed Weights   07/14/22 0500 07/15/22 0358 07/16/22 0233  Weight: 109.6 kg 112.5 kg 109.8 kg    Examination:  General exam: appears ill Respiratory system:  scattered rhonchi, rare wheezing, increased WOB Cardiovascular system: sinus tachy  Gastrointestinal system: Abdomen is nondistended, soft and nontender.  Central nervous system: Alert and oriented. No focal neurological deficits. Extremities: mottled LE's     Data Reviewed: I have personally reviewed following labs and imaging studies  CBC: Recent Labs  Lab 07/10/22 0859 07/11/22 0318 07/12/22 0336 07/13/22 0500 07/16/22 0638  WBC 11.7* 15.8* 13.6* 15.5* 19.5*  HGB 11.8* 12.6* 11.7* 13.0 15.1  HCT 37.1* 38.1* 37.3* 40.7 48.2  MCV 106.0* 104.7* 107.2* 106.3* 106.4*  PLT 142* 140* 143* 156 [id number]    Basic Metabolic Panel: Recent Labs  Lab 07/12/22 0336 07/13/22 0500 07/14/22 0345 07/15/22 0358 07/16/22 0638  NA 144 146* 146* 146* 140  K 3.9 3.8 4.4 4.3 4.6  CL 94* 92* 98 94* 86*  CO2 40* 39* 35* 40* 44*  GLUCOSE 221* 190* 223* 176* 228*  BUN 44* 49* 63* 71* 59*  CREATININE 1.19 1.08 1.10 1.13 0.95  CALCIUM 9.2 9.7 9.9 10.0 9.8  MG 2.7* 2.9* 3.0* 2.6* 2.3  PHOS 3.7  --  4.0 5.8*  --     GFR: Estimated Creatinine Clearance: 98.1 mL/min (by C-G formula based on SCr of 0.95 mg/dL).  Liver Function Tests: No results for input(s): "AST", "ALT", "ALKPHOS",  "BILITOT", "PROT", "ALBUMIN" in the last 168 hours.  CBG: Recent Labs  Lab 07/15/22 2330 07/16/22 0404 07/16/22 0820 07/16/22 1235 07/16/22 1634  GLUCAP 242* 170* 279* 318* 256*     Recent Results (from the past 240 hour(s))  Resp panel by RT-PCR (RSV, Flu Poonam Woehrle&B, Covid) Anterior Nasal Swab     Status: Abnormal   Collection Time: 06/27/2022 11:43 PM   Specimen: Anterior Nasal Swab  Result Value Ref Range Status   SARS Coronavirus 2 by RT PCR NEGATIVE NEGATIVE Final    Comment: (NOTE) SARS-CoV-2 target nucleic acids are NOT DETECTED.  The SARS-CoV-2 RNA is generally detectable in upper respiratory specimens during the acute phase of infection. The lowest concentration of SARS-CoV-2 viral copies this assay can detect is 138 copies/mL. Loza Prell negative result does not preclude SARS-Cov-2 infection and should not be used as the sole basis for treatment or other patient management decisions. Vlada Uriostegui negative result may occur with  improper specimen collection/handling, submission of specimen other than nasopharyngeal swab, presence of viral mutation(s) within the areas targeted by this assay, and inadequate number of viral copies(<138 copies/mL). Nefertiti Mohamad negative result must be combined with clinical observations, patient history, and epidemiological information. The expected result is Negative.  Fact Sheet for Patients:  EntrepreneurPulse.com.au  Fact Sheet for Healthcare Providers:  IncredibleEmployment.be  This test is no t yet approved or cleared by the [place] FDA and  has been authorized for detection and/or diagnosis of SARS-CoV-2 by FDA under an Emergency Use Authorization (EUA). This EUA will remain  in effect (meaning this test can be used) for the duration of the COVID-19 declaration under Section 564(b)(1) of the Act, 21 U.S.C.section 360bbb-3(b)(1), unless the authorization is terminated  or revoked sooner.       Influenza Mikale Silversmith by PCR  NEGATIVE NEGATIVE Final   Influenza B by PCR NEGATIVE NEGATIVE Final    Comment: (NOTE) The Xpert Xpress SARS-CoV-2/FLU/RSV plus assay is intended as an aid in the diagnosis of influenza from Nasopharyngeal swab specimens and should not be used as Takeyla Million sole basis for treatment. Nasal washings and aspirates are unacceptable for Xpert Xpress SARS-CoV-2/FLU/RSV testing.  Fact Sheet for Patients: EntrepreneurPulse.com.au  Fact Sheet  for Healthcare Providers: IncredibleEmployment.be  This test is not yet approved or cleared by the [place] and has been authorized for detection and/or diagnosis of SARS-CoV-2 by FDA under an Emergency Use Authorization (EUA). This EUA will remain in effect (meaning this test can be used) for the duration of the COVID-19 declaration under Section 564(b)(1) of the Act, 21 U.S.C. section 360bbb-3(b)(1), unless the authorization is terminated or revoked.     Resp Syncytial Virus by PCR POSITIVE (Dhaval Woo) NEGATIVE Final    Comment: (NOTE) Fact Sheet for Patients: EntrepreneurPulse.com.au  Fact Sheet for Healthcare Providers: IncredibleEmployment.be  This test is not yet approved or cleared by the [place] FDA and has been authorized for detection and/or diagnosis of SARS-CoV-2 by FDA under an Emergency Use Authorization (EUA). This EUA will remain in effect (meaning this test can be used) for the duration of the COVID-19 declaration under Section 564(b)(1) of the Act, 21 U.S.C. section 360bbb-3(b)(1), unless the authorization is terminated or revoked.  Performed at [facility], [address], [place], [place] [address]   Culture, blood (Routine X 2) w Reflex to ID Panel     Status: None   Collection Time: 07/09/22 12:05 AM   Specimen: Site Not Specified; Blood  Result Value Ref Range Status   Specimen Description   Final    SITE NOT SPECIFIED BOTTLES DRAWN AEROBIC AND  ANAEROBIC   Special Requests Blood Culture adequate volume  Final   Culture   Final    NO GROWTH 5 DAYS Performed at [facility], [address] [place], [place] [address]    Report Status 07/14/2022 FINAL  Final  Culture, blood (Routine X 2) w Reflex to ID Panel     Status: None   Collection Time: 07/09/22 12:11 AM   Specimen: Site Not Specified; Blood  Result Value Ref Range Status   Specimen Description   Final    SITE NOT SPECIFIED BOTTLES DRAWN AEROBIC AND ANAEROBIC   Special Requests Blood Culture adequate volume  Final   Culture   Final    NO GROWTH 5 DAYS Performed at [facility], [address], [place], [place] [address]    Report Status 07/14/2022 FINAL  Final  MRSA Next Gen by PCR, Nasal     Status: None   Collection Time: 07/09/22  5:05 AM   Specimen: Nasal Mucosa; Nasal Swab  Result Value Ref Range Status   MRSA by PCR Next Gen NOT DETECTED NOT DETECTED Final    Comment: (NOTE) The GeneXpert MRSA Assay (FDA approved for NASAL specimens only), is one component of Ariane Ditullio comprehensive MRSA colonization surveillance program. It is not intended to diagnose MRSA infection nor to guide or monitor treatment for MRSA infections. Test performance is not FDA approved in patients less than 91 years old. Performed at [facility], [address], [place],  [address]   MRSA Next Gen by PCR, Nasal     Status: None   Collection Time: 07/09/22 10:47 AM   Specimen: Nasal Mucosa; Nasal Swab  Result Value Ref Range Status   MRSA by PCR Next Gen NOT DETECTED NOT DETECTED Final    Comment: (NOTE) The GeneXpert MRSA Assay (FDA approved for NASAL specimens only), is one component of Valeree Leidy comprehensive MRSA colonization surveillance program. It is not intended to diagnose MRSA infection nor to guide or monitor treatment for MRSA infections. Test performance is not FDA approved in patients less than 52 years old. Performed at [facility], [address] [place],  [place] [address]          Radiology Studies: DG CHEST PORT 1 VIEW  Result Date: 07/16/2022 CLINICAL DATA:  Shortness of breath. EXAM: PORTABLE CHEST 1 VIEW COMPARISON:  Radiograph 07/13/2022, CT 07/09/2022 FINDINGS: Worsening patchy opacity in the left mid lower lung zone. Unchanged right infrahilar airspace disease. Chronic bronchial thickening with emphysema. Stable heart size and mediastinal contours. Weighted enteric tube in place with tip below the diaphragm not included in the field of view. No large pleural effusion. No pneumothorax. IMPRESSION: 1. Worsening patchy opacity in the left mid and lower lung zone, the possibility of acute infection superimposed on chronic treatment related change is raised. 2. Unchanged right infrahilar airspace disease. 3. Emphysema and chronic bronchial thickening. Electronically Signed   By: Keith Rake M.D.   On: 07/16/2022 14:45        Scheduled Meds:  apixaban  5 mg Per Tube BID   arformoterol  15 mcg Nebulization BID   atorvastatin  20 mg Per Tube QPM   budesonide (PULMICORT) nebulizer solution  0.5 mg Nebulization BID   [START ON July 20, 2022] feeding supplement  237 mL Oral BID BM   feeding supplement (PROSource TF20)  60 mL Per Tube BID   insulin aspart  0-15 Units Subcutaneous Q4H   insulin detemir  10 Units Subcutaneous BID   ipratropium-albuterol  3 mL Nebulization Q6H   metoprolol tartrate  12.5 mg Per Tube Q6H   multivitamin with minerals  1 tablet Oral Daily   mouth rinse  15 mL Mouth Rinse 4 times per day   pantoprazole (PROTONIX) IV  40 mg Intravenous Q24H   predniSONE  20 mg Per Tube Daily   thiamine  100 mg Per Tube Daily   Continuous Infusions:  sodium chloride Stopped (07/15/22 1840)   amiodarone 30 mg/hr (07/16/22 1625)   amiodarone 150 mg (07/16/22 1702)   feeding supplement (OSMOLITE 1.5 CAL)       LOS: 7 days    Time spent: over 30 min    Fayrene Helper, MD [facility]   To contact the attending  provider between 7A-7P or the covering provider during after hours 7P-7A, please log into the web site [email or web address] and access using universal  password for that web site. If you do not have the password, please call the hospital operator.  07/16/2022, 6:40 PM

## 2022-07-16 NOTE — Progress Notes (Signed)
Respiratory called to bedside due to O2 saturation decrease.

## 2022-07-16 NOTE — Inpatient Diabetes Management (Signed)
Inpatient Diabetes Program Recommendations  AACE/ADA: New Consensus Statement on Inpatient Glycemic Control (2015)  Target Ranges:  Prepandial:   less than 140 mg/dL      Peak postprandial:   less than 180 mg/dL (1-2 hours)      Critically ill patients:  140 - 180 mg/dL   Lab Results  Component Value Date   GLUCAP 318 (H) 07/16/2022   HGBA1C 6.8 (H) 07/09/2022    Review of Glycemic Control  Latest Reference Range & Units 07/16/22 08:20 07/16/22 12:35  Glucose-Capillary 70 - 99 mg/dL 279 (H) 318 (H)  (H): Data is abnormally high Diabetes history: Type 2 DM Outpatient Diabetes medications: none Current orders for Inpatient glycemic control: Levemir 10 units BID, novolog 0-15 units Q4H Osmolite @ 65 ml/hr Prednisone 20 mg QD  Inpatient Diabetes Program Recommendations:    Paged by dietitian as starting with nocturnal tube feeds.  Consider adding Novolog 4 units to be scheduled- 2000, 0000, 0400, 0800.  Thanks, Bronson Curb, MSN, RNC-OB Diabetes Coordinator [phone] (8a-5p)

## 2022-07-16 NOTE — Evaluation (Signed)
Clinical/Bedside Swallow Evaluation Patient Details  Name: [name] MRN: [id number] Date of Birth: 09-20-1955  Today's Date: 07/16/2022 Time: SLP Start Time (ACUTE ONLY): 0944 SLP Stop Time (ACUTE ONLY): 1003 SLP Time Calculation (min) (ACUTE ONLY): 19 min  Past Medical History:  Past Medical History:  Diagnosis Date   (HFpEF) heart failure with preserved ejection fraction ([place])    TTE 06/01/2021: EF 60-65, no RWMA, mild LVH, mild reduced RVSF, moderate pulm hypertension (RVSP 47.3), mild BAE, trivial MR, moderate TR, AV sclerosis, aortic root 41 mm, RAP 15   Atrial flutter (HCC)    s/p DCCV 06/03/21   Chest pain    Neg nuclear stress test in 2019   Colon cancer ([place]) 04/22/2013   COPD (chronic obstructive pulmonary disease) ([place])    Dilated aortic root ([place])    TTE 05/2021: 4.1 cm   Hypertension    Lung cancer ([place]) 08/26/2021   Renal disorder    Kidney stones   Past Surgical History:  Past Surgical History:  Procedure Laterality Date   BRONCHIAL BIOPSY  08/26/2021   Procedure: BRONCHIAL BIOPSIES;  Surgeon: Garner Nash, DO;  Location: [facility];  Service: Pulmonary;;   BRONCHIAL BRUSHINGS  08/26/2021   Procedure: BRONCHIAL BRUSHINGS;  Surgeon: Garner Nash, DO;  Location: [facility];  Service: Pulmonary;;   BRONCHIAL NEEDLE ASPIRATION BIOPSY  08/26/2021   Procedure: BRONCHIAL NEEDLE ASPIRATION BIOPSIES;  Surgeon: Garner Nash, DO;  Location: [facility];  Service: Pulmonary;;   CARDIOVERSION N/A 06/03/2021   Procedure: CARDIOVERSION;  Surgeon: Satira Sark, MD;  Location: [facility];  Service: Cardiovascular;  Laterality: N/A;   COLONOSCOPY N/A 04/29/2013   Procedure: COLONOSCOPY;  Surgeon: Danie Binder, MD;  [facility];  Service: Endoscopy;  Laterality: N/A;  10:30AM   Cyst removed from left elbow     ENDOVENOUS ABLATION SAPHENOUS VEIN W/ LASER Left 01/16/2019   endovenous laser ablation left greater saphenous vein by Ruta Hinds MD    KIDNEY STONE SURGERY     LITHOTRIPSY     PARTIAL COLECTOMY     TEE WITHOUT CARDIOVERSION N/A 06/03/2021   Procedure: TRANSESOPHAGEAL ECHOCARDIOGRAM (TEE);  Surgeon: Satira Sark, MD;  Location: [facility];  Service: Cardiovascular;  Laterality: N/A;   VIDEO BRONCHOSCOPY WITH ENDOBRONCHIAL ULTRASOUND Bilateral 08/26/2021   Procedure: VIDEO BRONCHOSCOPY WITH ENDOBRONCHIAL ULTRASOUND;  Surgeon: Garner Nash, DO;  Location: [facility];  Service: Pulmonary;  Laterality: Bilateral;  will need Guardant [id number]   HPI:  Pt is a 67 year old male who presented to [facility] with SOB, increased WOB with O2 Sats in the 80s requiring BIPAP. Patient was positive for RSV upon admission. Patient was admitted to the ICU, where at shift change on 2/17 patient developed a cardiac arrest after standing and collapsing. CPR was initiated and performed for 1 round, intubated for airway protection. ETT 2/17-2/18.  Now DNR/DNI. Pt with pmhx of atrial flutter, HFpEF, HTN, Lung cancer (stage iv), Colon Cancer (in remission).    Assessment / Plan / Recommendation  Clinical Impression  Pt presents with mild risk for aspiration.  Pt with crackling breath sounds, increased WOB on arrival.  Tachycardic on arrival with HR in 130s which continued throughout session.  Palliative NP present for assessment.  Pt tolerated all consistencies trialed without overt s/s of aspiration, but with abnormal baseline presentation change in vocal quality was difficult to assess.  Pt with CXR with BLL infiltrates which are improving from previous imaging.  Pt  is at risk for silent aspiration with hx COPD and current decreased respiratory status.  An instrumental assessment would be required to rule out silent aspiration. Discussed pt preference with family and NP.  Pt would like to resume regular diet at present with known risk of aspiration.  NP input greatly appreciated. SLP will sign off at this time, pt has no further ST needs.   Recommend regular  texture diet with thin liquids with swallow precautions as noted below.  SLP Visit Diagnosis: Dysphagia, unspecified (R13.10)    Aspiration Risk  Mild aspiration risk    Diet Recommendation Regular;Thin liquid   Liquid Administration via: Straw;Cup  Medication Administration: Whole meds with liquid (as tolerated)  Supervision: Intermittent supervision to cue for compensatory strategies  Compensations:  Slow rate; Small sips/bites; Take breaks with increased WOB  Postural Changes:  Seated upright at 90 degrees    Other  Recommendations Oral Care Recommendations: Oral care BID    Recommendations for follow up therapy are one component of a multi-disciplinary discharge planning process, led by the attending physician.  Recommendations may be updated based on patient status, additional functional criteria and insurance authorization.  Follow up Recommendations No SLP follow up      Assistance Recommended at Discharge  N/A  Functional Status Assessment Patient has not had a recent decline in their functional status  Frequency and Duration  (N/A)          Prognosis Prognosis for improved oropharyngeal function:  (N/A)      Swallow Study   General Date of Onset: 07/03/2022 HPI: Pt is a 67 year old male who presented to [facility] with SOB, increased WOB with O2 Sats in the 80s requiring BIPAP. Patient was positive for RSV upon admission. Patient was admitted to the ICU, where at shift change on 2/17 patient developed a cardiac arrest after standing and collapsing. CPR was initiated and performed for 1 round, intubated for airway protection. ETT 2/17-2/18.  Now DNR/DNI. Pt with pmhx of atrial flutter, HFpEF, HTN, Lung cancer (stage iv), Colon Cancer (in remission). Type of Study: Bedside Swallow Evaluation Previous Swallow Assessment: None Diet Prior to this Study: Clear liquid diet;Cortrak/Small bore NG tube Temperature Spikes Noted: No Respiratory Status: Nasal cannula History of  Recent Intubation: Yes Total duration of intubation (days): 1 days Date extubated: 07/10/22 Behavior/Cognition: Alert;Cooperative;Pleasant mood Oral Cavity Assessment: Within Functional Limits Oral Care Completed by SLP: No Oral Cavity - Dentition: Adequate natural dentition Patient Positioning: Upright in bed Baseline Vocal Quality: Normal (Congested/crackling) Volitional Cough: Strong Volitional Swallow: Able to elicit    Oral/Motor/Sensory Function Overall Oral Motor/Sensory Function: Within functional limits Facial ROM: Within Functional Limits Facial Symmetry: Within Functional Limits Lingual ROM: Within Functional Limits Lingual Symmetry: Within Functional Limits Lingual Strength: Within Functional Limits Velum: Within Functional Limits Mandible: Within Functional Limits   Ice Chips Ice chips: Not tested   Thin Liquid Thin Liquid: Within functional limits    Nectar Thick Nectar Thick Liquid: Not tested   Honey Thick Honey Thick Liquid: Not tested   Puree Puree: Within functional limits   Solid     Solid: Impaired Oral Phase Functional Implications: Prolonged oral transit      Celedonio Savage, MA, [place] Office: [phone] 07/16/2022,10:21 AM

## 2022-07-16 NOTE — Progress Notes (Signed)
Patient transferred to [id number] without complications.

## 2022-07-16 NOTE — Progress Notes (Signed)
Pt signed and held comfort care orders released. Family states they are waiting for granddaughter to get to hospital. Morphine gtt to be started as ordered.

## 2022-07-16 NOTE — Progress Notes (Signed)
Pharmacy Antibiotic Note  [name] is a 67 y.o. male admitted on 06/26/2022 with  RSV and SOB .  Pharmacy has been consulted for vancomycin and cefepime dosing for pneumonia; on ceftriaxone and azithromycin previously.  He is afebrile, WBC are elevated although has been on steroids, and renal function is normal. MRSA PCR is negative.   Plan: Vancomycin 2000 mg IV load then 1000 mg IV q12h Goal AUC 400-550, expected AUC: 484.6, SCr used: 0.95 Cefepime 2 g IV q8h Monitor renal function, clinical progress, cultures/sensitivities F/U LOT and de-escalate as able Vancomycin levels as clinically indicated    Height: '6\' 1"'$  (185.4 cm) Weight: 109.8 kg (242 lb) IBW/kg (Calculated) : 79.9  Temp (24hrs), Avg:97.4 F (36.3 C), Min:96.8 F (36 C), Max:98 F (36.7 C)  Recent Labs  Lab 07/10/22 0859 07/11/22 0318 07/12/22 0336 07/13/22 0500 07/14/22 0345 07/15/22 0358 07/16/22 0638  WBC 11.7* 15.8* 13.6* 15.5*  --   --  19.5*  CREATININE 1.11 1.17 1.19 1.08 1.10 1.13 0.95    Estimated Creatinine Clearance: 98.1 mL/min (by C-G formula based on SCr of 0.95 mg/dL).    Allergies  Allergen Reactions   Paclitaxel Shortness Of Breath    Hypersensitivity reaction. See progress note dated 09/27/2021.   Penicillins Other (See Comments)    Childhood Allergy  Unknown reaction   Has patient had a PCN reaction causing immediate rash, facial/tongue/throat swelling, SOB or lightheadedness with hypotension: No Has patient had a PCN reaction causing severe rash involving mucus membranes or skin necrosis: No Has patient had a PCN reaction that required hospitalization: No Has patient had a PCN reaction occurring within the last 10 years: No If all of the above answers are "NO", then may proceed with Cephalosporin use.     Thank you for involving pharmacy in this patient's care.  Renold Genta, PharmD, BCPS Clinical Pharmacist Clinical phone for 07/16/2022 is [phone] 07/16/2022 3:48  PM

## 2022-07-16 NOTE — Progress Notes (Signed)
Palliative Medicine Inpatient Follow Up Note   HPI: 67 y.o. male with medical history significant of atrial flutter, HFpEF, hypertension, colon cancer (in remission), lung cancer (recent adrenal mets plans for rad onc), COPD presents the ED with a chief complaint of dyspnea related to RSV in the setting of lung cancer, COPD, CHF exacerbation. Cardiac arrest 07/09/22 ROSC after 1 round CPR and intubated. Extubated 07/10/22 but still requiring BiPAP support.    Palliative care is involved to further support goals of care conversations. Per chart review and nursing check in respiratory state remains variable.  Today's Discussion 07/16/2022  *Please note that this is a verbal dictation therefore any spelling or grammatical errors are due to the "[place] [employer]" system interpretation.  Chart reviewed inclusive of vital signs, progress notes, laboratory results, and diagnostic images.   I met this morning with [name]. He is breathing rapidly though vocalizes that he feels "comfortable". He shares that he has not eaten in well over a week and is thirsty and hungry. He asks about a diet. I shared that presently he is on clear liquids and is an elevated risk of possible aspiration though I will requests the speech team comes by to complete an evaluation.   I shared in addition I would reach out to his wife for [place] all to meet as I have concerns for his tenuous respiratory state.   _____________________________ Addendum:  I met with [name] and his spouse, [name] at bedside. I was able to review recent progress notes with them. Initially [name] was not aware that his Stg IV lung cancer had metastasized to his adrenal gland. She felt they were two separate cancers. I shared that per notes metastasis from the lung cancer is what has been suspected. In addition we reviewed Dr. Worthy Flank notes.  We reviewed that the radiation to the lesion is identified to be "palliative". [name] was unaware of this or  what it meant. I shared that this will not be cured but perhaps it's growth diminished in the setting of radiotherapy.   We discussed at this time it is most important to consider what [name]'s wishes and desires are. We reviewed his seven day hospitalization and how variable he has been day to day in terms of his health. We discussed active problems being RSV PNA, Afib,  Vfib arrest, in addition to his chronic disease processes being his cancer (as above), and COPD.   Patient and his spouse feel that he was well functioning preceding a week prior to admission when he lost his appetite and was notably weaker, more SOB on exertion. Discussed the possible confounding factors to this.   Further reviewed patients goals. At the moment his main goal was to eat.   I was able to remain at bedside with the speech therapist came to bedside. She completed her evaluation and the risks of possible aspiration were reviewed versus doing an MBS. Patient shared he would not want a long term PEG tube and was willing to accept the risk of potential aspiration.   [place] is clear about not desiring intubation again should he decline. He is hoping to see at this time if he can further improve. WE did discuss per PT eval he is recommended for aggressive rehab but his breathing and HR need to stabilize before this can occur.   _____________________________________ Addendum #2:  I was able to go back to bedside and patients spouse, [name] met me outside. She shares that he husband has been clear  about his wishes in terms of no care escalation. She goes on to share that she does not want to harm his spirit which I why she is trying her best to remain positive for him.   We reviewed the plan from here which is to consider present modalities of care. I shared openly and honestly if he should worsen or decline we may very well be in a position whereby we need to discuss comfort care. She states that Kipp had very much been  in the mind frame that he may not make it out of the hospital. Patient's wife is aware of this  possibility, though shares she does not like hearing it.  I shared the medical team will continue to offer all supportive measures to improve his condition as long as it aligns with patient goals [name] very much wants [place] to be the driver of any and all decisions.   Questions and concerns addressed/Palliative Support Provided.   Objective Assessment: Vital Signs Vitals:   07/16/22 0754 07/16/22 0910  BP: (!) 128/100   Pulse: (!) 132   Resp: 18 (!) 29  Temp: (!) 97.4 F (36.3 C)   SpO2: 98%     Intake/Output Summary (Last 24 hours) at 07/16/2022 1136 Last data filed at 07/16/2022 0754 Gross per 24 hour  Intake 2410.82 ml  Output 3625 ml  Net -1214.18 ml   Last Weight  Most recent update: 07/16/2022  4:08 AM    Weight  109.8 kg (242 lb)            Gen:  Ill appearing Caucasian M in moderate distress HEENT: Dry mucous membranes CV: Irregular rate and rhythm  PULM: On 12LPM HFNC, breathing is labored ABD: soft/nontender  EXT: No edema  Neuro: Alert and oriented x3   SUMMARY OF RECOMMENDATIONS   DNAR/DNI  Open and honest conversations held in the setting of patients cancer and acute on chronic disease processes  SLP Eval - patient willing to accept risks of aspiration, would not desire long term PEG  Patients family would be a appreciate of a medical update --> Dr. Florene Glen aware  Ongoing PMT support  Time Spent: 92 Billing based on MDM: High ______________________________________________________________________________________ [place] Team Team Cell Phone: [phone] Please utilize secure chat with additional questions, if there is no response within 30 minutes please call the above phone number  Palliative Medicine Team providers are available by phone from 7am to 7pm daily and can be reached through the team cell phone.   Should this patient require assistance outside of these hours, please call the patient's attending physician.

## 2022-07-16 NOTE — Progress Notes (Signed)
Respiratory contacted due to pt experiencing more labored breathing. Oxygen saturation maintaining in the upper 90's. Dr. Florene Glen also advised.

## 2022-07-16 NOTE — Progress Notes (Signed)
NAME:  GUNNISON HERSH, MRN:  [id number], DOB:  02-05-1956, LOS: 7 ADMISSION DATE:  07/09/2022, CONSULTATION DATE:  07/09/22 REFERRING MD: Vanita Panda CHIEF COMPLAINT:  SOB   History of Present Illness:  67 year old male with pmhx of atrial flutter, HFpEF, HTN, Lung cancer, Colon Cancer and presented to [facility] with SOB, increased WOB with O2 Sats in the 80s requiring BIPAP. Patient was positive for RSV upon admission. Patient was admitted to the ICU, where at shift change on 2/17 patient developed a cardiac arrest after standing and collapsing. CPR was initiated and performed for 1 round, intubated for airway protection, and found to have a vfib which one shock was given converting to sinus tachycardia. Patient was transferred to [facility] for further management and care.   Pertinent  Medical History   Past Medical History:  Diagnosis Date   (HFpEF) heart failure with preserved ejection fraction ([place])    TTE 06/01/2021: EF 60-65, no RWMA, mild LVH, mild reduced RVSF, moderate pulm hypertension (RVSP 47.3), mild BAE, trivial MR, moderate TR, AV sclerosis, aortic root 41 mm, RAP 15   Atrial flutter (HCC)    s/p DCCV 06/03/21   Chest pain    Neg nuclear stress test in 2019   Colon cancer ([place]) 04/22/2013   COPD (chronic obstructive pulmonary disease) ([place])    Dilated aortic root ([place])    TTE 05/2021: 4.1 cm   Hypertension    Lung cancer ([place]) 08/26/2021   Renal disorder    Kidney stones     Significant Hospital Events: Including procedures, antibiotic start and stop dates in addition to other pertinent events   2/16 Admitted for respiratory failure requiring BIPAP, + for RSV 2/17 V-fib cardiac arrest, intubated, transferred [facility]  2/18 extubated/ bipap  2/19 ongoing afib/ SVT on dilt  Interim History / Subjective:  Worse resp status overnight.  Mental status also starting to deteriorate.  Wife at bedside.  Objective   Blood pressure (!) 124/97, pulse (!) 133, temperature 98 F (36.7  C), temperature source Oral, resp. rate (!) 37, height '6\' 1"'$  (1.854 m), weight 109.8 kg, SpO2 93 %. CVP:  [4 mmHg-17 mmHg] 9 mmHg  FiO2 (%):  [60 %-97 %] 60 %   Intake/Output Summary (Last 24 hours) at 07/16/2022 1633 Last data filed at 07/16/2022 1625 Gross per 24 hour  Intake 2548.82 ml  Output 1750 ml  Net 798.82 ml    Filed Weights   07/14/22 0500 07/15/22 0358 07/16/22 0233  Weight: 109.6 kg 112.5 kg 109.8 kg    Examination: Ill appearing Heart rate tachy, irregular Severe PVD changes Moves ext to command Repeating questions, poor insight Using accessory muscles  Sugars up WBC up CXR possible new infiltrate LLL  Resolved Hospital Problem list   N/a   Assessment & Plan:  Vfib cardiac arrest- in context of AECOPD and RSV pneumonia;  Acute on chronic hypoxemic and hypercarbic respiratory failure- worsening; question HCAP, question aspiration.  Baseline metastatic cancer on 6LPM chronically. Probable type 2 NSTEMI: echo preserved Advanced COPD in flare Stage IV lung cancer mets to adrenal gland Afluter/ Afib- recurrent, recalcitrant to dilt  Sat with wife and discussed how we have done aggressive care for a week now and if anything he is deteriorating.  She states he would never want to live in a SNF and I told her that would be the best case scenario; more likely patient would pass in hospital.  We discussed what this meant  and how the most compassionate thing we could do in accordance with his wishes would be starting a morphine drip to allow a peaceful death.    She agrees and patient agrees (although his capacity is somewhat in question at this point).  I think regardless of approach he is hours to days from dying.  Family will be notified by wife at her request.  Not to start morphine until family has had chance to visit (wife is not sure they will be able to).  - Morphine gtt titrated to RR 18 and/or signs of discomfort; order to be released by RN when wife is  ready - In hospital death expected - We will check on him tomorrow to provide emotional support to mother - Bedside RN, RR RN, and primary MD updated  Erskine Emery MD PCCM

## 2022-07-18 ENCOUNTER — Ambulatory Visit: Payer: Medicare HMO | Admitting: Radiation Oncology

## 2022-07-18 ENCOUNTER — Encounter: Payer: Self-pay | Admitting: Radiation Oncology

## 2022-07-18 NOTE — Progress Notes (Signed)
  Radiation Oncology         [phone]) [phone] ________________________________  Name: [name] MRN: [id number]  Date: 07/18/2022  DOB: 1956/04/03  End of Treatment Note  Diagnosis:    Recurrent metastatic Stage IIIB, cT3N2M0, NSCLC, squamous cell carcinoma of the LUL with right adrenal gland metastasis      Indication for treatment: curative    Radiation treatment dates:   n/a  Site/planned dose:   The patient had consultation in early February to consider SBRT to a newly noted right adrenal metastasis. He was going to begin this therapy the week of 07/11/22 after undergoing simulation. He was admitted to the hospital however prior to beginning radiation, and unfortunately due to respiratory failure, he continued to decline, and decisions were made to hold off on radiation. It was noted the patient passed away on Aug 01, 2022. We will be available as needed for his care team and family.     Carola Rhine, PAC

## 2022-07-20 ENCOUNTER — Ambulatory Visit: Payer: Medicare HMO | Admitting: Radiation Oncology

## 2022-07-22 ENCOUNTER — Ambulatory Visit: Payer: Medicare HMO | Admitting: Radiation Oncology

## 2022-07-22 NOTE — Consult Note (Signed)
WOC Nurse Consult Note: Reason for Consult:Bedside RN entered consult for MDRPI due to [place]. Patient is to be transitioning to comfort care after a remaining family member arrives per notes. Padding of Bipap recommended. Wound type:Pressure in the presence of altered perfusion Pressure Injury POA: No  WOC nursing team will not follow, but will remain available to this patient, the nursing and medical teams.  Please re-consult if needed.  Thank you for inviting [place] to participate in this patient's Plan of Care.  Maudie Flakes, MSN, RN, CNS, [place], Serita Grammes, [employer], [employer] phone:  [phone]

## 2022-07-22 NOTE — Death Summary Note (Addendum)
   DEATH SUMMARY   Patient Details  Name: [name] MRN: [id number] DOB: 1955-09-13 [id number], Hulen Shouts, PA-C Admission/Discharge Information   Admit Date:  08/07/22  Date of Death: Date of Death: Aug 16, 2022  Time of Death: Time of Death: 0445  Length of Stay: 8   Principle Cause of death: RSV pneumonia  Hospital Diagnoses: Principal Problem:   Acute respiratory failure with hypoxia and hypercapnia ([place]) Active Problems:   COPD with acute exacerbation ([place])   Hyperlipidemia   Atrial flutter ([place])   Hypertensive crisis   Acute heart failure (HCC)   Type 2 diabetes mellitus ([place])   Severe sepsis ([place])   RSV infection   Hyperkalemia   Elevated troponin   Swelling of lower extremity   Metastatic cancer ([place])   Malnutrition of moderate degree   Hospital Course: 67 year old male with pmhx of atrial flutter, HFpEF, HTN, Lung cancer, Colon Cancer and presented to [facility] with SOB, increased WOB with O2 Sats in the 80s requiring BIPAP. Patient was positive for RSV upon admission. Patient was admitted to the ICU, where at shift change on 2/17 patient developed Zakya Halabi cardiac arrest after standing and collapsing. CPR was initiated and performed for 1 round, intubated for airway protection, and found to have Treylen Gibbs vfib which one shock was given converting to sinus tachycardia. Patient was transferred to [facility] for further management and care.    Significant Events 2022/08/07 Admitted for respiratory failure requiring BIPAP, + for RSV 2/17 V-fib cardiac arrest, intubated, transferred [facility]  2/18 extubated/ bipap  2/19 ongoing afib/ SVT on dilt 2/24 transferred to [facility] - transitioned to comfort measures after discussion with PCCM.  Patient passed the morning of 2022/08/16.  Assessment and Plan:  See previous progress notes for additional details.  Transitioned to [facility] 2/24 due to tachypnea/tachycardia.  This was uptitrated to 40 L/min.  Critical care was asked to see him and based on their  discussion, plans made for comfort measures.    Vfib arrest Acute on Chronic Hypoxic and Hypercarbic Respiratory Failure  COPD Exacerbation  RSV pneumonia - extubated 2/18 - was placed on [facility] 2/24  Severe Sepsis Type 2 NSTEMI Stage IV Lung Cancer Atrial Flutter - cardiology has been following - amiodarone, eliquis Abdominal Distension - ? Ileus        Procedures: see prior notes  Consultations: see prior notes  The results of significant diagnostics from this hospitalization (including imaging, microbiology, ancillary and laboratory) are listed below for reference.   Significant Diagnostic Studies: DG CHEST PORT 1 VIEW  Result Date: 07/16/2022 CLINICAL DATA:  Shortness of breath. EXAM: PORTABLE CHEST 1 VIEW COMPARISON:  Radiograph 07/13/2022, CT 07/09/2022 FINDINGS: Worsening patchy opacity in the left mid lower lung zone. Unchanged right infrahilar airspace disease. Chronic bronchial thickening with emphysema. Stable heart size and mediastinal contours. Weighted enteric tube in place with tip below the diaphragm not included in the field of view. No large pleural effusion. No pneumothorax. IMPRESSION: 1. Worsening patchy opacity in the left mid and lower lung zone, the possibility of acute infection superimposed on chronic treatment related change is raised. 2. Unchanged right infrahilar airspace disease. 3. Emphysema and chronic bronchial thickening. Electronically Signed   By: Keith Rake M.D.   On: 07/16/2022 14:45    Microbiology: No results found for this or any previous visit (from the past 240 hour(s)).   Time spent: 30 minutes  Signed: Fayrene Helper, MD 08/12/22

## 2022-07-22 DEATH — deceased

## 2022-07-25 ENCOUNTER — Ambulatory Visit: Payer: Medicare HMO | Admitting: Radiation Oncology

## 2022-07-26 ENCOUNTER — Ambulatory Visit: Payer: Medicare HMO | Admitting: Radiation Oncology

## 2022-07-28 ENCOUNTER — Ambulatory Visit: Payer: Medicare HMO | Admitting: Radiation Oncology

## 2022-09-05 ENCOUNTER — Ambulatory Visit: Payer: Medicare HMO | Admitting: Cardiology

## 2022-09-16 ENCOUNTER — Other Ambulatory Visit: Payer: Medicare HMO

## 2022-09-20 ENCOUNTER — Ambulatory Visit: Payer: Medicare HMO | Admitting: Internal Medicine
